# Patient Record
Sex: Female | Born: 1935 | Race: White | Hispanic: No | Marital: Married | Smoking: Never smoker
Health system: Southern US, Community
[De-identification: ages and names within clinical notes are randomized; demographics above are authoritative.]

## PROBLEM LIST (undated history)

## (undated) DIAGNOSIS — M1990 Unspecified osteoarthritis, unspecified site: Secondary | ICD-10-CM

## (undated) DIAGNOSIS — R9089 Other abnormal findings on diagnostic imaging of central nervous system: Secondary | ICD-10-CM

## (undated) DIAGNOSIS — N83209 Unspecified ovarian cyst, unspecified side: Secondary | ICD-10-CM

## (undated) DIAGNOSIS — Z9221 Personal history of antineoplastic chemotherapy: Secondary | ICD-10-CM

## (undated) DIAGNOSIS — M5136 Other intervertebral disc degeneration, lumbar region: Secondary | ICD-10-CM

## (undated) DIAGNOSIS — H9190 Unspecified hearing loss, unspecified ear: Secondary | ICD-10-CM

## (undated) DIAGNOSIS — D126 Benign neoplasm of colon, unspecified: Secondary | ICD-10-CM

## (undated) DIAGNOSIS — I10 Essential (primary) hypertension: Secondary | ICD-10-CM

## (undated) DIAGNOSIS — Z923 Personal history of irradiation: Secondary | ICD-10-CM

## (undated) DIAGNOSIS — N950 Postmenopausal bleeding: Secondary | ICD-10-CM

## (undated) DIAGNOSIS — K219 Gastro-esophageal reflux disease without esophagitis: Secondary | ICD-10-CM

## (undated) DIAGNOSIS — N3941 Urge incontinence: Secondary | ICD-10-CM

## (undated) DIAGNOSIS — B351 Tinea unguium: Secondary | ICD-10-CM

## (undated) DIAGNOSIS — E785 Hyperlipidemia, unspecified: Secondary | ICD-10-CM

## (undated) DIAGNOSIS — M51369 Other intervertebral disc degeneration, lumbar region without mention of lumbar back pain or lower extremity pain: Secondary | ICD-10-CM

## (undated) DIAGNOSIS — C50919 Malignant neoplasm of unspecified site of unspecified female breast: Secondary | ICD-10-CM

## (undated) DIAGNOSIS — M8580 Other specified disorders of bone density and structure, unspecified site: Secondary | ICD-10-CM

## (undated) HISTORY — DX: Benign neoplasm of colon, unspecified: D12.6

## (undated) HISTORY — PX: TONSILLECTOMY: SHX5217

## (undated) HISTORY — DX: Unspecified ovarian cyst, unspecified side: N83.209

## (undated) HISTORY — DX: Postmenopausal bleeding: N95.0

## (undated) HISTORY — DX: Other specified disorders of bone density and structure, unspecified site: M85.80

## (undated) HISTORY — DX: Malignant neoplasm of unspecified site of unspecified female breast: C50.919

## (undated) HISTORY — DX: Other intervertebral disc degeneration, lumbar region without mention of lumbar back pain or lower extremity pain: M51.369

## (undated) HISTORY — PX: CATARACT EXTRACTION: SUR2

## (undated) HISTORY — DX: Gastro-esophageal reflux disease without esophagitis: K21.9

## (undated) HISTORY — PX: OTHER SURGICAL HISTORY: SHX169

## (undated) HISTORY — DX: Urge incontinence: N39.41

## (undated) HISTORY — DX: Unspecified hearing loss, unspecified ear: H91.90

## (undated) HISTORY — PX: TUBAL LIGATION: SHX77

## (undated) HISTORY — DX: Other abnormal findings on diagnostic imaging of central nervous system: R90.89

## (undated) HISTORY — DX: Essential (primary) hypertension: I10

## (undated) HISTORY — DX: Tinea unguium: B35.1

## (undated) HISTORY — DX: Unspecified osteoarthritis, unspecified site: M19.90

## (undated) HISTORY — DX: Hyperlipidemia, unspecified: E78.5

## (undated) HISTORY — DX: Other intervertebral disc degeneration, lumbar region: M51.36

---

## 1998-04-05 ENCOUNTER — Encounter: Payer: Self-pay | Admitting: Family Medicine

## 1998-04-05 ENCOUNTER — Ambulatory Visit (HOSPITAL_COMMUNITY): Admission: RE | Admit: 1998-04-05 | Discharge: 1998-04-05 | Payer: Self-pay | Admitting: Family Medicine

## 1998-05-04 DIAGNOSIS — N950 Postmenopausal bleeding: Secondary | ICD-10-CM

## 1998-05-04 HISTORY — DX: Postmenopausal bleeding: N95.0

## 1999-01-14 ENCOUNTER — Ambulatory Visit (HOSPITAL_COMMUNITY): Admission: RE | Admit: 1999-01-14 | Discharge: 1999-01-14 | Payer: Self-pay | Admitting: Gastroenterology

## 1999-01-29 ENCOUNTER — Encounter (INDEPENDENT_AMBULATORY_CARE_PROVIDER_SITE_OTHER): Payer: Self-pay

## 1999-01-29 ENCOUNTER — Other Ambulatory Visit: Admission: RE | Admit: 1999-01-29 | Discharge: 1999-01-29 | Payer: Self-pay | Admitting: Obstetrics and Gynecology

## 1999-05-05 DIAGNOSIS — C50919 Malignant neoplasm of unspecified site of unspecified female breast: Secondary | ICD-10-CM

## 1999-05-05 HISTORY — PX: MASTECTOMY: SHX3

## 1999-05-05 HISTORY — DX: Malignant neoplasm of unspecified site of unspecified female breast: C50.919

## 1999-05-05 HISTORY — PX: OTHER SURGICAL HISTORY: SHX169

## 1999-07-16 ENCOUNTER — Encounter (INDEPENDENT_AMBULATORY_CARE_PROVIDER_SITE_OTHER): Payer: Self-pay

## 1999-07-16 ENCOUNTER — Encounter: Payer: Self-pay | Admitting: Family Medicine

## 1999-07-16 ENCOUNTER — Ambulatory Visit (HOSPITAL_COMMUNITY): Admission: RE | Admit: 1999-07-16 | Discharge: 1999-07-16 | Payer: Self-pay | Admitting: Family Medicine

## 1999-07-25 ENCOUNTER — Encounter (INDEPENDENT_AMBULATORY_CARE_PROVIDER_SITE_OTHER): Payer: Self-pay | Admitting: Specialist

## 1999-07-26 ENCOUNTER — Inpatient Hospital Stay (HOSPITAL_COMMUNITY): Admission: AD | Admit: 1999-07-26 | Discharge: 1999-07-30 | Payer: Self-pay | Admitting: General Surgery

## 1999-08-06 ENCOUNTER — Encounter: Payer: Self-pay | Admitting: Oncology

## 1999-08-06 ENCOUNTER — Ambulatory Visit (HOSPITAL_COMMUNITY): Admission: RE | Admit: 1999-08-06 | Discharge: 1999-08-06 | Payer: Self-pay | Admitting: Oncology

## 1999-08-07 ENCOUNTER — Encounter: Payer: Self-pay | Admitting: Oncology

## 1999-08-07 ENCOUNTER — Ambulatory Visit (HOSPITAL_COMMUNITY): Admission: RE | Admit: 1999-08-07 | Discharge: 1999-08-07 | Payer: Self-pay | Admitting: Oncology

## 1999-08-08 ENCOUNTER — Ambulatory Visit (HOSPITAL_COMMUNITY): Admission: RE | Admit: 1999-08-08 | Discharge: 1999-08-08 | Payer: Self-pay | Admitting: Oncology

## 1999-08-08 ENCOUNTER — Encounter: Payer: Self-pay | Admitting: Oncology

## 1999-08-13 ENCOUNTER — Encounter: Payer: Self-pay | Admitting: Oncology

## 1999-08-13 ENCOUNTER — Ambulatory Visit (HOSPITAL_COMMUNITY): Admission: RE | Admit: 1999-08-13 | Discharge: 1999-08-13 | Payer: Self-pay | Admitting: Oncology

## 1999-08-25 ENCOUNTER — Encounter: Payer: Self-pay | Admitting: General Surgery

## 1999-08-25 ENCOUNTER — Ambulatory Visit (HOSPITAL_BASED_OUTPATIENT_CLINIC_OR_DEPARTMENT_OTHER): Admission: RE | Admit: 1999-08-25 | Discharge: 1999-08-25 | Payer: Self-pay | Admitting: General Surgery

## 1999-10-23 ENCOUNTER — Encounter: Payer: Self-pay | Admitting: Oncology

## 1999-10-23 ENCOUNTER — Ambulatory Visit (HOSPITAL_COMMUNITY): Admission: RE | Admit: 1999-10-23 | Discharge: 1999-10-23 | Payer: Self-pay | Admitting: Oncology

## 1999-11-20 ENCOUNTER — Encounter: Payer: Self-pay | Admitting: Oncology

## 1999-11-20 ENCOUNTER — Ambulatory Visit (HOSPITAL_COMMUNITY): Admission: RE | Admit: 1999-11-20 | Discharge: 1999-11-20 | Payer: Self-pay | Admitting: Oncology

## 1999-12-03 DIAGNOSIS — R9089 Other abnormal findings on diagnostic imaging of central nervous system: Secondary | ICD-10-CM

## 1999-12-03 HISTORY — DX: Other abnormal findings on diagnostic imaging of central nervous system: R90.89

## 2000-02-27 ENCOUNTER — Ambulatory Visit (HOSPITAL_COMMUNITY): Admission: RE | Admit: 2000-02-27 | Discharge: 2000-02-27 | Payer: Self-pay | Admitting: Oncology

## 2000-02-27 ENCOUNTER — Encounter: Payer: Self-pay | Admitting: Oncology

## 2000-03-01 ENCOUNTER — Encounter: Admission: RE | Admit: 2000-03-01 | Discharge: 2000-05-30 | Payer: Self-pay | Admitting: Radiation Oncology

## 2000-03-08 ENCOUNTER — Encounter: Payer: Self-pay | Admitting: Neurology

## 2000-03-08 ENCOUNTER — Ambulatory Visit (HOSPITAL_COMMUNITY): Admission: RE | Admit: 2000-03-08 | Discharge: 2000-03-08 | Payer: Self-pay | Admitting: Neurology

## 2000-07-06 ENCOUNTER — Encounter: Admission: RE | Admit: 2000-07-06 | Discharge: 2000-07-27 | Payer: Self-pay | Admitting: Oncology

## 2000-07-21 ENCOUNTER — Encounter: Admission: EM | Admit: 2000-07-21 | Discharge: 2000-07-21 | Payer: Self-pay | Admitting: Family Medicine

## 2000-07-21 ENCOUNTER — Encounter: Payer: Self-pay | Admitting: Family Medicine

## 2000-10-08 ENCOUNTER — Ambulatory Visit (HOSPITAL_COMMUNITY): Admission: AD | Admit: 2000-10-08 | Discharge: 2000-10-08 | Payer: Self-pay | Admitting: Plastic Surgery

## 2000-10-08 ENCOUNTER — Encounter (INDEPENDENT_AMBULATORY_CARE_PROVIDER_SITE_OTHER): Payer: Self-pay | Admitting: *Deleted

## 2000-10-08 ENCOUNTER — Encounter: Payer: Self-pay | Admitting: Plastic Surgery

## 2000-10-13 ENCOUNTER — Encounter: Admission: RE | Admit: 2000-10-13 | Discharge: 2000-10-13 | Payer: Self-pay | Admitting: *Deleted

## 2000-10-13 ENCOUNTER — Encounter: Payer: Self-pay | Admitting: *Deleted

## 2000-10-20 ENCOUNTER — Ambulatory Visit (HOSPITAL_BASED_OUTPATIENT_CLINIC_OR_DEPARTMENT_OTHER): Admission: RE | Admit: 2000-10-20 | Discharge: 2000-10-20 | Payer: Self-pay | Admitting: *Deleted

## 2001-01-10 ENCOUNTER — Ambulatory Visit (HOSPITAL_BASED_OUTPATIENT_CLINIC_OR_DEPARTMENT_OTHER): Admission: RE | Admit: 2001-01-10 | Discharge: 2001-01-10 | Payer: Self-pay | Admitting: General Surgery

## 2001-05-04 DIAGNOSIS — D126 Benign neoplasm of colon, unspecified: Secondary | ICD-10-CM

## 2001-05-04 HISTORY — DX: Benign neoplasm of colon, unspecified: D12.6

## 2001-05-23 ENCOUNTER — Ambulatory Visit (HOSPITAL_COMMUNITY): Admission: RE | Admit: 2001-05-23 | Discharge: 2001-05-23 | Payer: Self-pay | Admitting: Gastroenterology

## 2001-05-23 ENCOUNTER — Encounter (INDEPENDENT_AMBULATORY_CARE_PROVIDER_SITE_OTHER): Payer: Self-pay

## 2001-05-23 HISTORY — PX: COLONOSCOPY W/ POLYPECTOMY: SHX1380

## 2001-07-04 ENCOUNTER — Encounter: Payer: Self-pay | Admitting: Oncology

## 2001-07-04 ENCOUNTER — Ambulatory Visit (HOSPITAL_COMMUNITY): Admission: RE | Admit: 2001-07-04 | Discharge: 2001-07-04 | Payer: Self-pay | Admitting: Oncology

## 2001-07-14 ENCOUNTER — Ambulatory Visit (HOSPITAL_COMMUNITY): Admission: RE | Admit: 2001-07-14 | Discharge: 2001-07-14 | Payer: Self-pay | Admitting: Oncology

## 2001-07-14 ENCOUNTER — Encounter: Payer: Self-pay | Admitting: Oncology

## 2001-07-22 ENCOUNTER — Ambulatory Visit (HOSPITAL_COMMUNITY): Admission: RE | Admit: 2001-07-22 | Discharge: 2001-07-22 | Payer: Self-pay | Admitting: Radiation Oncology

## 2002-02-22 ENCOUNTER — Encounter: Payer: Self-pay | Admitting: Oncology

## 2002-02-22 ENCOUNTER — Ambulatory Visit (HOSPITAL_COMMUNITY): Admission: RE | Admit: 2002-02-22 | Discharge: 2002-02-22 | Payer: Self-pay | Admitting: Oncology

## 2002-05-01 ENCOUNTER — Other Ambulatory Visit: Admission: RE | Admit: 2002-05-01 | Discharge: 2002-05-01 | Payer: Self-pay | Admitting: Obstetrics and Gynecology

## 2002-06-26 ENCOUNTER — Encounter: Payer: Self-pay | Admitting: Oncology

## 2002-06-26 ENCOUNTER — Ambulatory Visit (HOSPITAL_COMMUNITY): Admission: RE | Admit: 2002-06-26 | Discharge: 2002-06-26 | Payer: Self-pay | Admitting: Oncology

## 2002-07-25 ENCOUNTER — Ambulatory Visit (HOSPITAL_COMMUNITY): Admission: RE | Admit: 2002-07-25 | Discharge: 2002-07-25 | Payer: Self-pay | Admitting: Oncology

## 2002-07-25 ENCOUNTER — Encounter: Payer: Self-pay | Admitting: Oncology

## 2002-08-15 ENCOUNTER — Encounter: Admission: RE | Admit: 2002-08-15 | Discharge: 2002-09-07 | Payer: Self-pay | Admitting: Oncology

## 2002-08-24 ENCOUNTER — Encounter: Payer: Self-pay | Admitting: Oncology

## 2002-08-24 ENCOUNTER — Ambulatory Visit (HOSPITAL_COMMUNITY): Admission: RE | Admit: 2002-08-24 | Discharge: 2002-08-24 | Payer: Self-pay | Admitting: Oncology

## 2003-01-15 ENCOUNTER — Encounter: Payer: Self-pay | Admitting: Oncology

## 2003-01-15 ENCOUNTER — Ambulatory Visit: Admission: RE | Admit: 2003-01-15 | Discharge: 2003-01-15 | Payer: Self-pay | Admitting: Oncology

## 2003-05-05 HISTORY — PX: CHOLECYSTECTOMY, LAPAROSCOPIC: SHX56

## 2003-05-10 ENCOUNTER — Observation Stay (HOSPITAL_COMMUNITY): Admission: RE | Admit: 2003-05-10 | Discharge: 2003-05-10 | Payer: Self-pay | Admitting: Surgery

## 2003-05-10 ENCOUNTER — Encounter (INDEPENDENT_AMBULATORY_CARE_PROVIDER_SITE_OTHER): Payer: Self-pay | Admitting: Specialist

## 2003-05-23 ENCOUNTER — Ambulatory Visit (HOSPITAL_COMMUNITY): Admission: RE | Admit: 2003-05-23 | Discharge: 2003-05-23 | Payer: Self-pay | Admitting: Oncology

## 2003-06-01 ENCOUNTER — Other Ambulatory Visit: Admission: RE | Admit: 2003-06-01 | Discharge: 2003-06-01 | Payer: Self-pay | Admitting: Family Medicine

## 2003-06-07 ENCOUNTER — Encounter: Admission: RE | Admit: 2003-06-07 | Discharge: 2003-06-07 | Payer: Self-pay | Admitting: Oncology

## 2003-10-10 ENCOUNTER — Encounter: Admission: RE | Admit: 2003-10-10 | Discharge: 2003-10-10 | Payer: Self-pay | Admitting: Oncology

## 2003-10-15 ENCOUNTER — Encounter: Admission: RE | Admit: 2003-10-15 | Discharge: 2003-10-15 | Payer: Self-pay | Admitting: Oncology

## 2004-04-21 ENCOUNTER — Ambulatory Visit (HOSPITAL_COMMUNITY): Admission: RE | Admit: 2004-04-21 | Discharge: 2004-04-21 | Payer: Self-pay | Admitting: Oncology

## 2004-05-19 ENCOUNTER — Ambulatory Visit: Payer: Self-pay | Admitting: Oncology

## 2004-10-28 ENCOUNTER — Encounter: Admission: RE | Admit: 2004-10-28 | Discharge: 2004-10-28 | Payer: Self-pay | Admitting: Oncology

## 2004-12-15 ENCOUNTER — Ambulatory Visit: Payer: Self-pay | Admitting: Oncology

## 2004-12-28 ENCOUNTER — Observation Stay (HOSPITAL_COMMUNITY): Admission: EM | Admit: 2004-12-28 | Discharge: 2004-12-29 | Payer: Self-pay | Admitting: Emergency Medicine

## 2005-01-14 ENCOUNTER — Encounter: Admission: RE | Admit: 2005-01-14 | Discharge: 2005-01-14 | Payer: Self-pay | Admitting: Family Medicine

## 2005-02-18 ENCOUNTER — Encounter: Admission: RE | Admit: 2005-02-18 | Discharge: 2005-02-18 | Payer: Self-pay | Admitting: Family Medicine

## 2005-06-09 ENCOUNTER — Encounter: Admission: RE | Admit: 2005-06-09 | Discharge: 2005-06-09 | Payer: Self-pay | Admitting: Oncology

## 2005-06-10 ENCOUNTER — Other Ambulatory Visit: Admission: RE | Admit: 2005-06-10 | Discharge: 2005-06-10 | Payer: Self-pay | Admitting: Family Medicine

## 2005-06-11 ENCOUNTER — Ambulatory Visit (HOSPITAL_COMMUNITY): Admission: RE | Admit: 2005-06-11 | Discharge: 2005-06-11 | Payer: Self-pay | Admitting: Oncology

## 2005-06-12 ENCOUNTER — Ambulatory Visit: Payer: Self-pay | Admitting: Oncology

## 2005-11-19 ENCOUNTER — Encounter: Admission: RE | Admit: 2005-11-19 | Discharge: 2005-11-19 | Payer: Self-pay | Admitting: Oncology

## 2005-12-18 ENCOUNTER — Ambulatory Visit (HOSPITAL_COMMUNITY): Admission: RE | Admit: 2005-12-18 | Discharge: 2005-12-18 | Payer: Self-pay | Admitting: Oncology

## 2006-01-19 ENCOUNTER — Ambulatory Visit: Payer: Self-pay | Admitting: Oncology

## 2006-01-21 LAB — CBC WITH DIFFERENTIAL/PLATELET
BASO%: 0.3 % (ref 0.0–2.0)
Basophils Absolute: 0 10*3/uL (ref 0.0–0.1)
EOS%: 0.6 % (ref 0.0–7.0)
Eosinophils Absolute: 0 10*3/uL (ref 0.0–0.5)
HCT: 37.6 % (ref 34.8–46.6)
HGB: 12.9 g/dL (ref 11.6–15.9)
LYMPH%: 24.6 % (ref 14.0–48.0)
MCH: 30.4 pg (ref 26.0–34.0)
MCHC: 34.2 g/dL (ref 32.0–36.0)
MCV: 88.9 fL (ref 81.0–101.0)
MONO#: 0.5 10*3/uL (ref 0.1–0.9)
MONO%: 8.4 % (ref 0.0–13.0)
NEUT#: 4 10*3/uL (ref 1.5–6.5)
NEUT%: 66.1 % (ref 39.6–76.8)
Platelets: 166 10*3/uL (ref 145–400)
RBC: 4.23 10*6/uL (ref 3.70–5.32)
RDW: 14.3 % (ref 11.3–14.5)
WBC: 6.1 10*3/uL (ref 3.9–10.0)
lymph#: 1.5 10*3/uL (ref 0.9–3.3)

## 2006-01-21 LAB — COMPREHENSIVE METABOLIC PANEL
ALT: 18 U/L (ref 0–40)
AST: 19 U/L (ref 0–37)
Albumin: 4.1 g/dL (ref 3.5–5.2)
Alkaline Phosphatase: 72 U/L (ref 39–117)
BUN: 37 mg/dL — ABNORMAL HIGH (ref 6–23)
CO2: 31 mEq/L (ref 19–32)
Calcium: 9.7 mg/dL (ref 8.4–10.5)
Chloride: 103 mEq/L (ref 96–112)
Creatinine, Ser: 1.24 mg/dL — ABNORMAL HIGH (ref 0.40–1.20)
Glucose, Bld: 90 mg/dL (ref 70–99)
Potassium: 4.1 mEq/L (ref 3.5–5.3)
Sodium: 143 mEq/L (ref 135–145)
Total Bilirubin: 0.5 mg/dL (ref 0.3–1.2)
Total Protein: 6.3 g/dL (ref 6.0–8.3)

## 2006-01-21 LAB — LACTATE DEHYDROGENASE: LDH: 179 U/L (ref 94–250)

## 2006-01-21 LAB — CANCER ANTIGEN 27.29: CA 27.29: 12 U/mL (ref 0–39)

## 2006-07-27 ENCOUNTER — Ambulatory Visit: Payer: Self-pay | Admitting: Oncology

## 2006-07-29 LAB — CBC WITH DIFFERENTIAL/PLATELET
BASO%: 0.3 % (ref 0.0–2.0)
Basophils Absolute: 0 10*3/uL (ref 0.0–0.1)
EOS%: 0.7 % (ref 0.0–7.0)
Eosinophils Absolute: 0 10*3/uL (ref 0.0–0.5)
HCT: 38.1 % (ref 34.8–46.6)
HGB: 13.5 g/dL (ref 11.6–15.9)
LYMPH%: 20.2 % (ref 14.0–48.0)
MCH: 31 pg (ref 26.0–34.0)
MCHC: 35.4 g/dL (ref 32.0–36.0)
MCV: 87.8 fL (ref 81.0–101.0)
MONO#: 0.5 10*3/uL (ref 0.1–0.9)
MONO%: 6.6 % (ref 0.0–13.0)
NEUT#: 5 10*3/uL (ref 1.5–6.5)
NEUT%: 72.2 % (ref 39.6–76.8)
Platelets: 142 10*3/uL — ABNORMAL LOW (ref 145–400)
RBC: 4.34 10*6/uL (ref 3.70–5.32)
RDW: 13.6 % (ref 11.3–14.5)
WBC: 6.9 10*3/uL (ref 3.9–10.0)
lymph#: 1.4 10*3/uL (ref 0.9–3.3)

## 2006-07-29 LAB — COMPREHENSIVE METABOLIC PANEL
ALT: 16 U/L (ref 0–35)
AST: 19 U/L (ref 0–37)
Albumin: 4.1 g/dL (ref 3.5–5.2)
Alkaline Phosphatase: 73 U/L (ref 39–117)
BUN: 31 mg/dL — ABNORMAL HIGH (ref 6–23)
CO2: 30 mEq/L (ref 19–32)
Calcium: 10.1 mg/dL (ref 8.4–10.5)
Chloride: 101 mEq/L (ref 96–112)
Creatinine, Ser: 1.21 mg/dL — ABNORMAL HIGH (ref 0.40–1.20)
Glucose, Bld: 100 mg/dL — ABNORMAL HIGH (ref 70–99)
Potassium: 3.8 mEq/L (ref 3.5–5.3)
Sodium: 140 mEq/L (ref 135–145)
Total Bilirubin: 0.5 mg/dL (ref 0.3–1.2)
Total Protein: 6.5 g/dL (ref 6.0–8.3)

## 2006-07-29 LAB — CANCER ANTIGEN 27.29: CA 27.29: 21 U/mL (ref 0–39)

## 2006-07-29 LAB — LACTATE DEHYDROGENASE: LDH: 167 U/L (ref 94–250)

## 2006-11-29 ENCOUNTER — Encounter: Admission: RE | Admit: 2006-11-29 | Discharge: 2006-11-29 | Payer: Self-pay | Admitting: Oncology

## 2007-01-31 ENCOUNTER — Ambulatory Visit: Payer: Self-pay | Admitting: Oncology

## 2007-02-02 ENCOUNTER — Ambulatory Visit (HOSPITAL_COMMUNITY): Admission: RE | Admit: 2007-02-02 | Discharge: 2007-02-02 | Payer: Self-pay | Admitting: Oncology

## 2007-02-02 LAB — CBC WITH DIFFERENTIAL/PLATELET
BASO%: 0.3 % (ref 0.0–2.0)
Basophils Absolute: 0 10*3/uL (ref 0.0–0.1)
EOS%: 0.9 % (ref 0.0–7.0)
Eosinophils Absolute: 0.1 10*3/uL (ref 0.0–0.5)
HCT: 37.5 % (ref 34.8–46.6)
HGB: 12.9 g/dL (ref 11.6–15.9)
LYMPH%: 25.2 % (ref 14.0–48.0)
MCH: 30.6 pg (ref 26.0–34.0)
MCHC: 34.5 g/dL (ref 32.0–36.0)
MCV: 88.7 fL (ref 81.0–101.0)
MONO#: 0.5 10*3/uL (ref 0.1–0.9)
MONO%: 7.7 % (ref 0.0–13.0)
NEUT#: 4.1 10*3/uL (ref 1.5–6.5)
NEUT%: 65.9 % (ref 39.6–76.8)
Platelets: 175 10*3/uL (ref 145–400)
RBC: 4.23 10*6/uL (ref 3.70–5.32)
RDW: 13.5 % (ref 11.3–14.5)
WBC: 6.2 10*3/uL (ref 3.9–10.0)
lymph#: 1.6 10*3/uL (ref 0.9–3.3)

## 2007-02-08 LAB — COMPREHENSIVE METABOLIC PANEL
ALT: 25 U/L (ref 0–35)
AST: 25 U/L (ref 0–37)
Albumin: 4.1 g/dL (ref 3.5–5.2)
Alkaline Phosphatase: 77 U/L (ref 39–117)
BUN: 34 mg/dL — ABNORMAL HIGH (ref 6–23)
CO2: 28 mEq/L (ref 19–32)
Calcium: 9.8 mg/dL (ref 8.4–10.5)
Chloride: 100 mEq/L (ref 96–112)
Creatinine, Ser: 1.31 mg/dL — ABNORMAL HIGH (ref 0.40–1.20)
Glucose, Bld: 85 mg/dL (ref 70–99)
Potassium: 3.7 mEq/L (ref 3.5–5.3)
Sodium: 137 mEq/L (ref 135–145)
Total Bilirubin: 0.5 mg/dL (ref 0.3–1.2)
Total Protein: 6.4 g/dL (ref 6.0–8.3)

## 2007-02-08 LAB — LACTATE DEHYDROGENASE: LDH: 191 U/L (ref 94–250)

## 2007-02-08 LAB — CANCER ANTIGEN 27.29: CA 27.29: 13 U/mL (ref 0–39)

## 2007-02-08 LAB — VITAMIN D PNL(25-HYDRXY+1,25-DIHY)-BLD
Vit D, 1,25-Dihydroxy: 39 pg/mL (ref 6–62)
Vit D, 25-Hydroxy: 36 ng/mL (ref 20–57)

## 2007-02-22 ENCOUNTER — Ambulatory Visit (HOSPITAL_COMMUNITY): Admission: RE | Admit: 2007-02-22 | Discharge: 2007-02-22 | Payer: Self-pay | Admitting: Oncology

## 2007-06-13 ENCOUNTER — Encounter: Admission: RE | Admit: 2007-06-13 | Discharge: 2007-06-13 | Payer: Self-pay | Admitting: Oncology

## 2007-08-09 ENCOUNTER — Ambulatory Visit: Payer: Self-pay | Admitting: Oncology

## 2007-08-10 LAB — CBC WITH DIFFERENTIAL/PLATELET
BASO%: 0.4 % (ref 0.0–2.0)
Basophils Absolute: 0 10*3/uL (ref 0.0–0.1)
EOS%: 1.4 % (ref 0.0–7.0)
Eosinophils Absolute: 0.1 10*3/uL (ref 0.0–0.5)
HCT: 39.4 % (ref 34.8–46.6)
HGB: 14 g/dL (ref 11.6–15.9)
LYMPH%: 21.6 % (ref 14.0–48.0)
MCH: 31 pg (ref 26.0–34.0)
MCHC: 35.6 g/dL (ref 32.0–36.0)
MCV: 87.1 fL (ref 81.0–101.0)
MONO#: 0.4 10*3/uL (ref 0.1–0.9)
MONO%: 5.6 % (ref 0.0–13.0)
NEUT#: 4.5 10*3/uL (ref 1.5–6.5)
NEUT%: 71 % (ref 39.6–76.8)
Platelets: 171 10*3/uL (ref 145–400)
RBC: 4.52 10*6/uL (ref 3.70–5.32)
RDW: 13.9 % (ref 11.3–14.5)
WBC: 6.4 10*3/uL (ref 3.9–10.0)
lymph#: 1.4 10*3/uL (ref 0.9–3.3)

## 2007-08-11 LAB — COMPREHENSIVE METABOLIC PANEL
ALT: 21 U/L (ref 0–35)
AST: 21 U/L (ref 0–37)
Albumin: 4.4 g/dL (ref 3.5–5.2)
Alkaline Phosphatase: 85 U/L (ref 39–117)
BUN: 35 mg/dL — ABNORMAL HIGH (ref 6–23)
CO2: 26 mEq/L (ref 19–32)
Calcium: 10.5 mg/dL (ref 8.4–10.5)
Chloride: 102 mEq/L (ref 96–112)
Creatinine, Ser: 1.18 mg/dL (ref 0.40–1.20)
Glucose, Bld: 97 mg/dL (ref 70–99)
Potassium: 4.2 mEq/L (ref 3.5–5.3)
Sodium: 142 mEq/L (ref 135–145)
Total Bilirubin: 0.6 mg/dL (ref 0.3–1.2)
Total Protein: 7 g/dL (ref 6.0–8.3)

## 2007-08-11 LAB — CANCER ANTIGEN 27.29: CA 27.29: 18 U/mL (ref 0–39)

## 2007-08-11 LAB — LACTATE DEHYDROGENASE: LDH: 189 U/L (ref 94–250)

## 2007-08-11 LAB — VITAMIN D 25 HYDROXY (VIT D DEFICIENCY, FRACTURES): Vit D, 25-Hydroxy: 46 ng/mL (ref 30–89)

## 2007-08-17 LAB — VITAMIN D 1,25 DIHYDROXY: Vit D, 1,25-Dihydroxy: 42 pg/mL (ref 15–75)

## 2007-10-27 ENCOUNTER — Other Ambulatory Visit: Admission: RE | Admit: 2007-10-27 | Discharge: 2007-10-27 | Payer: Self-pay | Admitting: Family Medicine

## 2007-10-27 LAB — HM PAP SMEAR: HM Pap smear: NORMAL

## 2007-12-01 ENCOUNTER — Encounter: Admission: RE | Admit: 2007-12-01 | Discharge: 2007-12-01 | Payer: Self-pay | Admitting: Oncology

## 2008-02-08 ENCOUNTER — Ambulatory Visit: Payer: Self-pay | Admitting: Oncology

## 2008-02-10 LAB — CBC WITH DIFFERENTIAL/PLATELET
BASO%: 0.3 % (ref 0.0–2.0)
Basophils Absolute: 0 10*3/uL (ref 0.0–0.1)
EOS%: 1.3 % (ref 0.0–7.0)
Eosinophils Absolute: 0.1 10*3/uL (ref 0.0–0.5)
HCT: 37.9 % (ref 34.8–46.6)
HGB: 12.9 g/dL (ref 11.6–15.9)
LYMPH%: 24.9 % (ref 14.0–48.0)
MCH: 30.6 pg (ref 26.0–34.0)
MCHC: 34.1 g/dL (ref 32.0–36.0)
MCV: 89.8 fL (ref 81.0–101.0)
MONO#: 0.4 10*3/uL (ref 0.1–0.9)
MONO%: 7.1 % (ref 0.0–13.0)
NEUT#: 4.2 10*3/uL (ref 1.5–6.5)
NEUT%: 66.4 % (ref 39.6–76.8)
Platelets: 153 10*3/uL (ref 145–400)
RBC: 4.23 10*6/uL (ref 3.70–5.32)
RDW: 14.2 % (ref 11.3–14.5)
WBC: 6.3 10*3/uL (ref 3.9–10.0)
lymph#: 1.6 10*3/uL (ref 0.9–3.3)

## 2008-02-13 LAB — COMPREHENSIVE METABOLIC PANEL
ALT: 16 U/L (ref 0–35)
AST: 24 U/L (ref 0–37)
Albumin: 4.1 g/dL (ref 3.5–5.2)
Alkaline Phosphatase: 68 U/L (ref 39–117)
BUN: 33 mg/dL — ABNORMAL HIGH (ref 6–23)
CO2: 27 mEq/L (ref 19–32)
Calcium: 9.9 mg/dL (ref 8.4–10.5)
Chloride: 104 mEq/L (ref 96–112)
Creatinine, Ser: 1.28 mg/dL — ABNORMAL HIGH (ref 0.40–1.20)
Glucose, Bld: 81 mg/dL (ref 70–99)
Potassium: 4 mEq/L (ref 3.5–5.3)
Sodium: 141 mEq/L (ref 135–145)
Total Bilirubin: 0.4 mg/dL (ref 0.3–1.2)
Total Protein: 6.1 g/dL (ref 6.0–8.3)

## 2008-02-13 LAB — VITAMIN D 25 HYDROXY (VIT D DEFICIENCY, FRACTURES): Vit D, 25-Hydroxy: 49 ng/mL (ref 30–89)

## 2008-02-13 LAB — LACTATE DEHYDROGENASE: LDH: 173 U/L (ref 94–250)

## 2008-02-13 LAB — CANCER ANTIGEN 27.29: CA 27.29: 15 U/mL (ref 0–39)

## 2008-07-30 ENCOUNTER — Ambulatory Visit: Payer: Self-pay | Admitting: Oncology

## 2008-08-02 LAB — CBC WITH DIFFERENTIAL/PLATELET
BASO%: 0.4 % (ref 0.0–2.0)
Basophils Absolute: 0 10*3/uL (ref 0.0–0.1)
EOS%: 1.2 % (ref 0.0–7.0)
Eosinophils Absolute: 0.1 10*3/uL (ref 0.0–0.5)
HCT: 40 % (ref 34.8–46.6)
HGB: 13.7 g/dL (ref 11.6–15.9)
LYMPH%: 23.6 % (ref 14.0–49.7)
MCH: 30.4 pg (ref 25.1–34.0)
MCHC: 34.2 g/dL (ref 31.5–36.0)
MCV: 88.8 fL (ref 79.5–101.0)
MONO#: 0.4 10*3/uL (ref 0.1–0.9)
MONO%: 6.2 % (ref 0.0–14.0)
NEUT#: 4.5 10*3/uL (ref 1.5–6.5)
NEUT%: 68.6 % (ref 38.4–76.8)
Platelets: 152 10*3/uL (ref 145–400)
RBC: 4.5 10*6/uL (ref 3.70–5.45)
RDW: 13.7 % (ref 11.2–14.5)
WBC: 6.6 10*3/uL (ref 3.9–10.3)
lymph#: 1.6 10*3/uL (ref 0.9–3.3)

## 2008-08-03 LAB — COMPREHENSIVE METABOLIC PANEL
ALT: 17 U/L (ref 0–35)
AST: 19 U/L (ref 0–37)
Albumin: 4.3 g/dL (ref 3.5–5.2)
Alkaline Phosphatase: 78 U/L (ref 39–117)
BUN: 36 mg/dL — ABNORMAL HIGH (ref 6–23)
CO2: 27 mEq/L (ref 19–32)
Calcium: 9.9 mg/dL (ref 8.4–10.5)
Chloride: 103 mEq/L (ref 96–112)
Creatinine, Ser: 1.31 mg/dL — ABNORMAL HIGH (ref 0.40–1.20)
Glucose, Bld: 97 mg/dL (ref 70–99)
Potassium: 4.3 mEq/L (ref 3.5–5.3)
Sodium: 140 mEq/L (ref 135–145)
Total Bilirubin: 0.5 mg/dL (ref 0.3–1.2)
Total Protein: 6.6 g/dL (ref 6.0–8.3)

## 2008-08-03 LAB — CANCER ANTIGEN 27.29: CA 27.29: 12 U/mL (ref 0–39)

## 2008-08-03 LAB — VITAMIN D 25 HYDROXY (VIT D DEFICIENCY, FRACTURES): Vit D, 25-Hydroxy: 52 ng/mL (ref 30–89)

## 2008-08-03 LAB — LACTATE DEHYDROGENASE: LDH: 162 U/L (ref 94–250)

## 2008-12-02 DIAGNOSIS — N83209 Unspecified ovarian cyst, unspecified side: Secondary | ICD-10-CM

## 2008-12-02 HISTORY — DX: Unspecified ovarian cyst, unspecified side: N83.209

## 2008-12-03 ENCOUNTER — Encounter: Admission: RE | Admit: 2008-12-03 | Discharge: 2008-12-03 | Payer: Self-pay | Admitting: Oncology

## 2008-12-17 HISTORY — PX: COLONOSCOPY: SHX174

## 2008-12-17 LAB — HM COLONOSCOPY

## 2009-06-13 ENCOUNTER — Encounter: Admission: RE | Admit: 2009-06-13 | Discharge: 2009-06-13 | Payer: Self-pay | Admitting: Oncology

## 2009-09-06 ENCOUNTER — Ambulatory Visit: Payer: Self-pay | Admitting: Oncology

## 2009-09-09 LAB — CBC WITH DIFFERENTIAL/PLATELET
BASO%: 0.3 % (ref 0.0–2.0)
Basophils Absolute: 0 10*3/uL (ref 0.0–0.1)
EOS%: 1.6 % (ref 0.0–7.0)
Eosinophils Absolute: 0.1 10*3/uL (ref 0.0–0.5)
HCT: 39.9 % (ref 34.8–46.6)
HGB: 13.5 g/dL (ref 11.6–15.9)
LYMPH%: 19.8 % (ref 14.0–49.7)
MCH: 30.7 pg (ref 25.1–34.0)
MCHC: 33.8 g/dL (ref 31.5–36.0)
MCV: 90.7 fL (ref 79.5–101.0)
MONO#: 0.5 10*3/uL (ref 0.1–0.9)
MONO%: 6.2 % (ref 0.0–14.0)
NEUT#: 5.5 10*3/uL (ref 1.5–6.5)
NEUT%: 72.1 % (ref 38.4–76.8)
Platelets: 154 10*3/uL (ref 145–400)
RBC: 4.4 10*6/uL (ref 3.70–5.45)
RDW: 14.1 % (ref 11.2–14.5)
WBC: 7.6 10*3/uL (ref 3.9–10.3)
lymph#: 1.5 10*3/uL (ref 0.9–3.3)

## 2009-09-09 LAB — COMPREHENSIVE METABOLIC PANEL
ALT: 26 U/L (ref 0–35)
AST: 26 U/L (ref 0–37)
Albumin: 4 g/dL (ref 3.5–5.2)
Alkaline Phosphatase: 76 U/L (ref 39–117)
BUN: 29 mg/dL — ABNORMAL HIGH (ref 6–23)
CO2: 28 mEq/L (ref 19–32)
Calcium: 9.3 mg/dL (ref 8.4–10.5)
Chloride: 103 mEq/L (ref 96–112)
Creatinine, Ser: 1.27 mg/dL — ABNORMAL HIGH (ref 0.40–1.20)
Glucose, Bld: 119 mg/dL — ABNORMAL HIGH (ref 70–99)
Potassium: 3.6 mEq/L (ref 3.5–5.3)
Sodium: 140 mEq/L (ref 135–145)
Total Bilirubin: 0.5 mg/dL (ref 0.3–1.2)
Total Protein: 6.3 g/dL (ref 6.0–8.3)

## 2009-09-09 LAB — CANCER ANTIGEN 27.29: CA 27.29: 17 U/mL (ref 0–39)

## 2009-09-09 LAB — LACTATE DEHYDROGENASE: LDH: 184 U/L (ref 94–250)

## 2009-09-09 LAB — VITAMIN D 25 HYDROXY (VIT D DEFICIENCY, FRACTURES): Vit D, 25-Hydroxy: 59 ng/mL (ref 30–89)

## 2009-12-10 ENCOUNTER — Encounter: Admission: RE | Admit: 2009-12-10 | Discharge: 2009-12-10 | Payer: Self-pay | Admitting: Oncology

## 2010-05-04 DIAGNOSIS — H9190 Unspecified hearing loss, unspecified ear: Secondary | ICD-10-CM

## 2010-05-04 HISTORY — DX: Unspecified hearing loss, unspecified ear: H91.90

## 2010-05-25 ENCOUNTER — Encounter: Payer: Self-pay | Admitting: Oncology

## 2010-09-11 ENCOUNTER — Other Ambulatory Visit: Payer: Self-pay | Admitting: Oncology

## 2010-09-11 ENCOUNTER — Encounter (HOSPITAL_BASED_OUTPATIENT_CLINIC_OR_DEPARTMENT_OTHER): Payer: Medicare Other | Admitting: Oncology

## 2010-09-11 DIAGNOSIS — Z170 Estrogen receptor positive status [ER+]: Secondary | ICD-10-CM

## 2010-09-11 DIAGNOSIS — C50919 Malignant neoplasm of unspecified site of unspecified female breast: Secondary | ICD-10-CM

## 2010-09-11 LAB — CBC WITH DIFFERENTIAL/PLATELET
BASO%: 0.9 % (ref 0.0–2.0)
Basophils Absolute: 0.1 10*3/uL (ref 0.0–0.1)
EOS%: 2.3 % (ref 0.0–7.0)
Eosinophils Absolute: 0.1 10*3/uL (ref 0.0–0.5)
HCT: 39.8 % (ref 34.8–46.6)
HGB: 13.3 g/dL (ref 11.6–15.9)
LYMPH%: 23.7 % (ref 14.0–49.7)
MCH: 29.9 pg (ref 25.1–34.0)
MCHC: 33.3 g/dL (ref 31.5–36.0)
MCV: 89.7 fL (ref 79.5–101.0)
MONO#: 0.5 10*3/uL (ref 0.1–0.9)
MONO%: 7.4 % (ref 0.0–14.0)
NEUT#: 4.3 10*3/uL (ref 1.5–6.5)
NEUT%: 65.7 % (ref 38.4–76.8)
Platelets: 153 10*3/uL (ref 145–400)
RBC: 4.43 10*6/uL (ref 3.70–5.45)
RDW: 13.9 % (ref 11.2–14.5)
WBC: 6.6 10*3/uL (ref 3.9–10.3)
lymph#: 1.6 10*3/uL (ref 0.9–3.3)

## 2010-09-12 LAB — COMPREHENSIVE METABOLIC PANEL
ALT: 17 U/L (ref 0–35)
AST: 21 U/L (ref 0–37)
Albumin: 4.2 g/dL (ref 3.5–5.2)
Alkaline Phosphatase: 77 U/L (ref 39–117)
BUN: 35 mg/dL — ABNORMAL HIGH (ref 6–23)
CO2: 26 mEq/L (ref 19–32)
Calcium: 10.6 mg/dL — ABNORMAL HIGH (ref 8.4–10.5)
Chloride: 104 mEq/L (ref 96–112)
Creatinine, Ser: 1.18 mg/dL (ref 0.40–1.20)
Glucose, Bld: 67 mg/dL — ABNORMAL LOW (ref 70–99)
Potassium: 4.2 mEq/L (ref 3.5–5.3)
Sodium: 142 mEq/L (ref 135–145)
Total Bilirubin: 0.4 mg/dL (ref 0.3–1.2)
Total Protein: 6.4 g/dL (ref 6.0–8.3)

## 2010-09-12 LAB — LACTATE DEHYDROGENASE: LDH: 174 U/L (ref 94–250)

## 2010-09-12 LAB — VITAMIN D 25 HYDROXY (VIT D DEFICIENCY, FRACTURES): Vit D, 25-Hydroxy: 68 ng/mL (ref 30–89)

## 2010-09-12 LAB — CANCER ANTIGEN 27.29: CA 27.29: 19 U/mL (ref 0–39)

## 2010-09-18 ENCOUNTER — Other Ambulatory Visit: Payer: Self-pay | Admitting: Oncology

## 2010-09-18 ENCOUNTER — Encounter (HOSPITAL_BASED_OUTPATIENT_CLINIC_OR_DEPARTMENT_OTHER): Payer: Medicare Other | Admitting: Oncology

## 2010-09-18 DIAGNOSIS — Z9011 Acquired absence of right breast and nipple: Secondary | ICD-10-CM

## 2010-09-18 DIAGNOSIS — C50919 Malignant neoplasm of unspecified site of unspecified female breast: Secondary | ICD-10-CM

## 2010-09-18 DIAGNOSIS — Z170 Estrogen receptor positive status [ER+]: Secondary | ICD-10-CM

## 2010-09-19 NOTE — H&P (Signed)
Connie [name], Connie [name] NO.:  [id number]   MEDICAL RECORD NO.:  [id number]          PATIENT TYPE:  EMS   LOCATION:  MAJO                         FACILITY:  [facility]   PHYSICIAN:  Jackie Plum, M.D.DATE OF BIRTH:  1927-09-05   DATE OF ADMISSION:  12/28/2004  DATE OF DISCHARGE:                                HISTORY & PHYSICAL   CHIEF COMPLAINT:  Syncope.   HISTORY OF PRESENT ILLNESS:  The patient is a very pleasant 77 year old  Caucasian lady who was brought to the hospital by her husband, Gerlene Burdock, on  account of above complaint.  According to the husband and the patient, she  had not been feeling well over the last week or so, and she had been treated  by her primary care physician with Z-Pak for chest congestion and  bronchitis.  In addition, she recently changed her blood pressure  medication.  She  recently had a refill of her blood pressure medicines, and  she noted that the color of the medication was different.  She is not sure  if there was difference in the dosages, though.  Also, her blood pressure  had run low a few times during the week.  The patient is querist at church,  and today at church during a living church, she passed out.  She was grabbed  by the husband and slumped down.  She was out for a few minutes.  There was  no witnessed tonic-clonic activity.  She was not having any bladder or bowel  incontinence.  EMT was activated, and apparently the patient was noted to be  hypotensive, but I do not have the EMT documentation to this effect, and  this was given to me by the patient's husband during history-taking process.  Evidently the patient was given a bolus of normal saline.  A head CT was  done, and the preliminary report indicates no acute stroke.  She had a 12-  lead EKG which showed sinus rhythm without any acute ST or T wave changes.  Hospitalists services was, therefore, asked for admission.   The patient denies any chest pain,  shortness of breath, dizziness.  She does  not recall any significant antecedent symptomatology prior to her syncopal  episode.  At the moment her only complaint is generalized weakness.  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   PAST MEDICAL HISTORY:  1.  History of hypertension.  2.  Breast cancer.   ALLERGIES:  No known drug allergies.   MEDICATIONS:  1.  Benazepril/hydrochlorothiazide 20/12.5 mg 1 daily.  2.  Centrum Silver 1 per day.  3.  Detrol LA 4 mg daily.  4.  Zithromax 50 mg daily for 3 more doses.  5.  Arimidex 1 mg daily.  6.  Fosamax 17 mg weekly.  7.  Catrix 600 mg 1 per day.   FAMILY HISTORY:  Positive for heart disease.   SOCIAL HISTORY:  The patient does not smoke cigarettes, does not  drink  alcohol.  She lives with her husband, and they have three grown-up children,  two of whom live outside of [place].  They have eight grandchildren.  They are well traveled and have traveled throughout [place] and almost all  the countries of the world.  They last traveled a couple of months ago when  they were in [place].   REVIEW OF SYSTEMS:  Significant positives and negatives unremarkable except  as noted in HPI above.   PHYSICAL EXAMINATION:  VITAL SIGNS:  Blood pressure 142/55, pulse 83,  temperature 97, respiratory rate 20, O2 saturation 99%.  GENERAL:  She is not ill appearing.  She is not in distress, appears  comfortable.  HEENT:  Normocephalic and atraumatic.  Pupils equal, round, and reactive to  light.  Extraocular movements intact.  Oropharynx moist.  NECK:  Supple.  No JVD.  LUNGS: Clear to auscultation.  CARDIAC:  Regular rate and rhythm, no gallops or murmur.  ABDOMEN:  Soft, nontender.  Bowel sounds present.  They were normoactive.  EXTREMITIES:  No cyanosis, no edema.  CNS:  The patient is alert and oriented  x3.  No acute focal deficits.   LABORATORY DATA:  CT scan report, 12-lead EKG as stated above.   WBC 15, hemoglobin 14.3, hematocrit 40.3, MCV 88.5, platelet count 181.  Point-of-care cardiac markers were negative.  UA was negative for urinary  tract infection.  Metabolic panel: Sodium 136, potassium 4, chloride 100,  CO2 27, glucose 109, BUN 27, creatinine 1.4, calcium 9.  Total protein 6.7,  albumin 3.8, AST 51, ALT 19, alkaline phosphatase 82, total bilirubin 0.8.   IMPRESSION:  1.  Syncopal episodes.  This is likely secondary to blood pressure-related      hypotension.  The patient will be admitted.  Initially want to enzymes      and follow up on her CT scan final report.  Will give her some IV      supplementation overnight.  Will hold her antihypertensive and will need      to clarify pharmacist from [place] where she gets her supplies and      possibly may need reduction in dosage.  2.  Leukocytosis.  There is no focal infection noted, and this may be stress      related, could be fluid as well.      Jackie Plum, M.D.  Electronically Signed     GO/MEDQ  D:  12/28/2004  T:  12/28/2004  Job:  [id number]   cc:   Lavonda Jumbo, M.D.  [address] [place], [place] [address]  Fax: [phone]

## 2010-09-19 NOTE — Op Note (Signed)
[place]  Patient:    Connie [name], Connie [name]                   MRN: [id number] Proc. Date: 07/25/99 Adm. Date:  [id number] Attending:  Janalyn Rouse CC:         Meredith Staggers, M.D.             Dr. Jess Barters                           Operative Report  PREOPERATIVE DIAGNOSIS:  Multicentric carcinoma of the right breast.  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  OPERATION:  Right modified radial mastectomy followed by TRAM reconstruction.  SURGEON:  Rose Phi. Maple Hudson, M.D.  ANESTHESIA:  General.  DESCRIPTION OF PROCEDURE:  After suitable general endotracheal anesthesia was induced, the patient was placed in the supine position and the entire chest, breasts, and abdomen prepped and draped in a standard fashion.  A transverse elliptical incision incorporating the nipple areolar complex on the right side nd the palpable two separate lesions was then outlined.  Incisions were then made nd the superior flap dissected to the level of the clavicle to the upper margin of  breast tissue, and then medially to near the sternum, and inferiorly to the inframammary fold at the rectus, and then laterally to the latissimus dorsi muscle.  We then removed the breast by dissecting from medial to lateral, incorporating he pectoralis fascia.  At the margin of the pectoralis major, we incised and retracted it, and dissected the tissue, and then incised the clavipectoral fascia, and along the pectoralis minor.  This exposed the level 2 lymph nodes, as well as the axillary vein.  We then swept all of the tissue from beneath the vein and deep o the pectoralis minor, incorporating the level 1 and level 2 nodes.  There were several obviously palpable lymph nodes.  The long thoracic nerve and the thoracodorsal nerves were identified and preserved.  Following removal of the specimen, we had  good hemostasis.  Teena Irani. Odis Luster, M.D. then came in to do the TRAM reconstruction which will be dictated in a separate note. DD:  07/25/99 TD:  07/25/99 Job: [id number]

## 2010-09-19 NOTE — Op Note (Signed)
[facility]  Patient:    Connie [name], Connie [name] Visit Number: [id number] MRN: [id number]          Service Type: Attending:  Verlin Grills, M.D. Dictated by:   Verlin Grills, M.D. Proc. Date: 05/23/01   CC:         Meredith Staggers, M.D.   Operative Report  PROCEDURE:  Screening colonoscopy with cecal polypectomy.  REFERRING PHYSICIAN:  Meredith Staggers, M.D.  PROCEDURE INDICATION:  Ms. Connie [name] is a 77 year old female born 15-Jul-1927. She is due for her first screening colonoscopy with polypectomy to prevent colon cancer.  I discussed with the patient the complications associated with colonoscopy and polypectomy, including a 15 per 1000 risk of bleeding and 4 per 1000 risk of colonic perforation requiring surgical repair. [name] has signed the operative permit.  ENDOSCOPIST:  Verlin Grills, M.D.  PREMEDICATION:  Demerol 50 mg, Versed 5 mg.  ENDOSCOPE:  Olympus Pediatric Colonoscope.  DESCRIPTION OF PROCEDURE:  After obtaining informed consent, [name] was placed in the left lateral decubitus position. I administered intravenous Versed and intravenous Demerol to achieve conscious sedation for the procedure. The patients blood pressure, oxygen saturation, and cardiac rhythm were monitored throughout the procedure and documented in the medical record.  Anal inspection was normal. The rectal exam was normal. The Olympus Pediatric video colonoscope was introduced into the rectum and easily advanced to the cecum. Colonic preparation for the exam today was excellent.  Rectum:  Normal.  Sigmoid colon and descending colon:  Normal.  Splenic flexure:  Normal.  Transverse colon:  Normal.  Hepatic flexure:  Normal.  Ascending colon:  Normal.  Cecum and ileocecal valve:  From the proximal cecum a 1-mm sessile polyp was removed with the cold snare and submitted for pathologic  interpretation.  ASSESSMENT:  From the proximal cecum a 1-mm sessile polyp was removed; otherwise, normal proctocolonoscopy to the cecum. Dictated by:   Verlin Grills, M.D. Attending:  Verlin Grills, M.D. DD:  05/23/01 TD:  05/24/01 Job: [id number]

## 2010-09-19 NOTE — Discharge Summary (Signed)
[place]  Patient:    Connie [name], Connie [name]                   MRN: [id number] Adm. Date:  [id number] Disc. Date: 07/30/99 Attending:  Janalyn Rouse                           Discharge Summary  FINAL DIAGNOSIS:  Right breast cancer.  PROCEDURE PERFORMED: 1. Right modified radical mastectomy. 2. Right breast reconstruction an ipsilateral transverse rectus abdominis    myocutaneous flap reconstruction.  HISTORY OF PRESENT ILLNESS:  A 77 year old woman who presented with a right breast cancer.  She had found this breast mass recently, two separate lesions actually  identified, and on ultrasound-guided biopsies, both proved to be carcinoma, poorly differentiated.  PAST MEDICAL HISTORY:  Positive for some hypertension controlled with Ziac and lso for some arthritis for which she takes Vioxx.  No history of breast cancer or ovarian cancer in the family.  PHYSICAL EXAMINATION:  There was a palpable mass at 7 oclock and 9 oclock position in the right breast.  It did appear that she had palpable axillary lymph nodes on that exam.  For further details of the history and physical, please see the chart.  HOSPITAL COURSE:  Initially, she was taken to surgery at which time the right modified radical mastectomy and right breast reconstruction were performed. She tolerated these procedures well.  Postoperatively, she did well.  She remained afebrile.  The TRAM flap did develop a little bit of ecchymosis and congestion at the very lateral aspect of the flap.  The majority of the flap, although it did have some mottling early on, this resolved.  Laterally, it has improved but there is a small area that may develop into an open wound out laterally.  Otherwise, the flap remains soft and has done very well.  The abdomen also appears to be healthy.  Drains were functioning and one of the drains is actually underneath the breast reconstruction,  and is draining only minimal scant amounts for the past 48 hours and is removed today. Axillary drain and abdominal drains remain in place.  It is felt that she is ready to be  discharged.  DISPOSITION:  She is discharged on diet as tolerated.  DISCHARGE MEDICATIONS: 1. Darvocet-N 100 one every 6 hours p.r.n. pain, a total of #12 given. 2. Carafate 1 g p.o. q.i.d. for what sounds like some heartburn. 3. Nystatin 5 cc swish and swallow twice a day for the next four days.  Antibiotics will be discontinued at this point since she has been on Cleocin now for five days.  ACTIVITY:  No lifting, no exercising.  WOUND CARE:  No shower yet and she is not to wear a bra.  Empty the drains three times a day and record the amounts.  FOLLOW-UP:  Will see her back in the office next week for recheck.  She has already seen Dr. Donnie Coffin, her oncologist, who will be organizing her postoperative chemotherapy. DD:  07/30/99 TD:  07/30/99 Job: [id number]

## 2010-09-19 NOTE — Op Note (Signed)
[place]  Patient:    Connie [name], Connie [name]                   MRN: [id number] Proc. Date: 07/25/99 Adm. Date:  [id number] Attending:  Janalyn Rouse                           Operative Report  PREOPERATIVE DIAGNOSIS:  Right breast carcinoma.  POSTOPERATIVE DIAGNOSIS:  Right breast carcinoma.  PROCEDURE PERFORMED:  Right breast reconstruction using an ipsilateral transverse rectus abdominis myocutaneous flap.  SURGEON:  Teena Irani. Odis Luster, M.D.  ANESTHESIA:  General.  ESTIMATED BLOOD LOSS:  150 cc.  DRAINS:  Four Blake drains left, two abdomen and two chest.  CLINICAL NOTE:  A 77 year old woman with multicentric right breast cancer for whom modified radical mastectomy had been recommended.  General surgery referred her for evaluation for breast reconstruction and for carefully counseling.  She elected to use her own tissue using the TRAM flap.  The nature of this procedure, the risks, and complications were discussed in great detail including a slightly increased  risk due to her advanced age.  She understood all of these risks and possible complications and wished to proceed.  DESCRIPTION OF PROCEDURE:  The patient was taken to the operating room and the general surgery portion of the procedure, right modified radical mastectomy had  been completed.  This had tolerated that well.  The incision was then made around the umbilicus and the umbilicus dissected free from surrounding tissue, leaving a generous fatty stalk of the umbilicus in order to preserve its blood supply. The upper incision for the planned elliptical incision was then made, and the dissection carried down through the subcutaneous tissue, beveling in a cephalad  direction.  Care was taken to avoid damage to the underlying rectus muscles, and dissection was continued up to the area of the xiphoid and connected through-and-through with the subcutaneous tunnel to  the right chest.  She was then checked for the closure, and the lower ______ of the planned incision was then made, and the dissection carried down through the subcutaneous tissue using the  electrocautery.  The left lateral aspect of the flap was then lifted up off the  underlying rectus muscles using electrocautery over the midline, and the right ide lifted up over to the lateral border of the rectus muscle.  Bipolar were used to elevate over to the lateral row of the perforating vessels.  Dissection was continued across the midline and just rarely across the midline.  Incisions were then made parallel using the scalpel through the anterior rectus fascia, leaving a 2.5 cm wide strip of fascia on the underlying right rectus muscle.  The rectus muscle was then gently dissected free from the surrounding fascial attachments using the bipolar and a scalpel as necessary, especially in the area of the tendinous inscriptions.  The muscle had been freed entirely.  The deep inferior  epigastric vessels were identified and the rectus muscle was then mobilized distal to the deep inferior epigastric vessels.  The rectus muscle was then divided using electrocautery.  Attention was then directed back to the right chest.  The wound was irrigated thoroughly with saline and meticulous hemostasis with electrocautery.  ______ were positioned and brought out through separate stab wounds inferiorly and laterally, and secured with 3-0 Prolene sutures.  Attention was then directed back to the abdomen where the deep inferior epigastric vessels were  identified, the artery triple ligated proximally and double ligated distally, and veins also double ligated proximally and distally, and divided. ll of zone 4 and a portion of zone 3 was then excised.  There was bright red bleeding from the flap edge.  The flap was then transferred through the subcutaneous tunnel up to the right chest, but was  temporarily secured with skin staples, and covered with a dry towel in order to preserve its warmth.  Attention was then directed back to the abdomen where there was thorough irrigation with saline, and excellent hemostasis having been confirmed, the fascial closure with 0 Prolene interrupted figure-of-eight sutures, taking great care to avoid ny damage to the underlying intra-abdominal contents.  The onlay mesh was then placed after first irrigating again with saline.  The mesh was placed, was bringing the umbilicus up through the mesh, and the mesh secured with 2-0 Prolene running simple suture around its periphery.  Thorough irrigation again with saline and excellent hemostasis having been confirmed, two Blake drains were positioned and brought ut through separate stab wounds inferiorly and secured with 3-0 Prolene sutures. he abdominal closure with 2-0 Vicryl in interrupted and inverted deep sutures.  An incision was then made in the abdominal wall as close to the midline as possible, and the umbilicus brought up through the _______ using 3-0 Vicryl interrupted and inverted deep sutures, and 5-0 nylon simple interrupted sutures.  Attention was then directed back to the chest where the TRAM flap appeared to have very nice color and capillary refill.  There did not appear to be any vascular compromise and it was under no tension.  The flap was marked for deepithelialization and the flap then suspended from the superior and medial chest wall using 3-0 Vicryl horizontal mattress sutures to the deepithelialized flap edge.  Flap insetting then completed with 3-0 Vicryl interrupted and inverted deep sutures, and a running 3-0 Vicryl subcuticular suture.  Again, the flap continued to have excellent color and excellent capillary refill.  She was transported to the recovery room in stable condition after tolerating the procedure well. DD:  07/25/99 TD:  07/28/99 Job:  [id number]

## 2010-09-19 NOTE — Op Note (Signed)
[place]  Patient:    Connie [name], KEMPE                   MRN: [id number] Proc. Date: 08/25/99 Adm. Date:  [id number] Attending:  Janalyn Rouse                           Operative Report  PREOPERATIVE DIAGNOSIS:  Extensive breast cancer on the right.  POSTOPERATIVE DIAGNOSIS:  Extensive breast cancer on the right.  OPERATION:  Insertion of implantable port.  SURGEON:  Rose Phi. Maple Hudson, M.D.  ANESTHESIA:  MAC.  DESCRIPTION OF PROCEDURE:  The patient was placed on the operating table and the left upper chest and neck prepped and draped in the usual fashion.  There was a  roll between the shoulders.  Under local anesthesia, I tried on several passes to make a left subclavian puncture and could only hit the artery.  I then asked J. Claybon Jabs, M.D. to  try, and he tried an IJ approach and could not get that, and then retried the subclavian approach and was able to get into the subclavian vein.  The wire was then passed and proper positioning of the wire was confirmed by fluoroscopy.  Also under local, we then made a pocket on the anterior chest wall for the implantable port.  We tunneled between the pocket in the subclavian area and then passed the preattached Davol catheter.  The implantable port was then fixed in the pocket with two 2-0 Prolene sutures.  We then appropriately cut the length of the catheter and then passed the dilator and peel-away sheath over the wire, and removed the wire, and then removed the dilator, and passed the catheter through the peel-away sheath and then removed it.  Proper position of the catheter and the tip were then reconfirmed by fluoroscopy.  The system was completely flushed and then heparinized with a needle left for access for chemotherapy tomorrow.  The implantable port site was then closed with 3-0 Vicryl and a subcuticular  4-0 Monocryl.  Steri-Strips were used for the rest of the  incisions.  With the system, access dressings were applied.  The patient was then transferred to the  recovery room in satisfactory condition having tolerated the procedure well.  A  chest x-ray will be obtained in the recovery room. DD:  08/25/99 TD:  08/25/99 Job: [id number]

## 2010-09-19 NOTE — Op Note (Signed)
NAME:  Connie [name], Connie [name]                      ACCOUNT NO.:  [id number]   MEDICAL RECORD NO.:  [id number]                   PATIENT TYPE:  AMB   LOCATION:  DAY                                  FACILITY:  [facility]   PHYSICIAN:  Sandria Bales. Ezzard Standing, M.D.               DATE OF BIRTH:  09-10-1927   DATE OF PROCEDURE:  05/10/2003  DATE OF DISCHARGE:                                 OPERATIVE REPORT   PREOPERATIVE DIAGNOSIS:  Chronic cholecystitis.   POSTOPERATIVE DIAGNOSIS:  1. Chronic cholecystitis with cholelithiasis.  2. 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   OPERATION/PROCEDURE:  1. Laparoscopic cholecystectomy and intraoperative cholangiogram.  2. Washings of peritoneal fluid.   SURGEON:  Sandria Bales. Ezzard Standing, M.D.   FIRST ASSISTANT:  Abigail Miyamoto, M.D.   ANESTHESIA:  General endotracheal anesthesia.   ESTIMATED BLOOD LOSS:  Minimal.   INDICATIONS:  [name] is a 77 year old white female who is a patient  of Dr. Arsenio Loader who comes with symptomatic cholelithiasis.  The patient  had a right TRAM flap which at least in part failed.  Discussed with her the  indications and potential complications of the surgery.  Potential  complications include but not limited to bleeding, infection, bile leak,  open abdominal surgery, and I do not know how much the TRAM flap itself with  interfere with trying to get into her abdominal cavity.  The patient agrees  with proceeding with the gallbladder surgery.   DESCRIPTION OF PROCEDURE:  The patient was taken to the operating room where  she underwent a general endotracheal anesthesia.  She is allergic to Keflex  so I tried giving her Cipro but she was only given 100 mg, had a rash,  complained of itching of her arms, so we stopped the antibiotics at that  point.  Gave her no further antibiotics.  Her abdomen was prepped with  Hibiclens.  Because she has a shellfish allergy, I was worried about giving  her Betadine as a prep.   She was sterilely draped.   I went through an infraumbilical incision into the abdominal cavity. I kind  of nicked part of the mesh, but actually got into the abdominal cavity  without a lot of difficulty and placing Hasson trocar which was 12 mm in  diameter.  I then carried out abdominal exploration.  The right and left  lobes of the liver were basically unremarkable and the gallbladder and scar  tissue around it.  What was remarkable was she had this thickened white  tissue which seemed to occupy her transverse colon/omentum.  It had a  scarring nature.  It did not appear malignant.  She has some other changes  in her what appeared to be small bowel.  I took photos of these and included  these in the chart.  Her right colon, however, her left colon and her  abdominal wall all looked unremarkable.  I could  actually even seen some  sutures that came through the anterior abdominal wall.   I then placed three additional trocars, a 10 mm trocar in the epigastric  area, a 5 mm trocar in the right mid subcostal, a 5 mm trocar in the right  lateral subcostal.  I grabbed the gallbladder, rotated it cephalad.  She did  have adhesions in the duodenum to the gallbladder wall and these were taken  down with sharp Bovie electrocautery.   I did find the cystic duct and shot an intraoperative cholangiogram.  I used  the Taut catheter inserted in the abdominal wall by using cut-off Taut  catheter inserted through a 14-gauge Jelco.  The cut put to the side of the  cut cystic duct and secured with __________  clip.   The cholangiogram was then shot using gastric opaque solution.  I used about  6 mL.  It showed free flow of dye down the cystic duct to the common bile  duct.  The __________  was tortuous but had no obstruction or filling defect  into the duodenum and that was normal.  It also refluxed both hepatic  radicals.   I then removed the Taut catheter, triply clipped the cystic duct and  divided  the cystic duct and a very small cystic artery which I double endo-clipped,  then divided and then removed the gallbladder using primarily the hook Bovie  coagulation and placed an EndoCatch bag.  Then irrigated the abdomen.  Actually I put about 600-700 mL of fluid in the abdomen and got about 3-4 mL  back.  I sent this off for cytology because of these peculiar changes in the  transverse colon.  Again this had a benign characteristic.  I do not know  whether this is due to prior chemotherapy or some other event that she had.  The trocars were removed in turn.  There was no bleeding at any trocar site.  I closed the umbilical port with a Z stitch.  I then closed the skin at each  port with a 5-0 Vicryl suture.  __________  Benzoin and Steri-Strips.   The patient tolerated the procedure well, was transported to the recovery  room in good condition.  Sponge and needle count were correct.                                               Sandria Bales. Ezzard Standing, M.D.    DHN/MEDQ  D:  05/10/2003  T:  05/10/2003  Job:  [id number]   cc:   Meredith Staggers, M.D.  [address]  [place]  [place] [address]  Fax: [phone]   Pierce Crane, M.D.  [address] - [facility]  [place]  [place] [address]  Fax: [phone]   Wynn Banker, M.D.  [address] - [facility]  [place]  [place] [address]  Fax: [phone]

## 2010-09-19 NOTE — Op Note (Signed)
[facility]  Patient:    Connie [name], Connie [name]                   MRN: [id number] Proc. Date: 10/20/00 Adm. Date:  [id number] Attending:  Melody Comas.                           Operative Report  PREOPERATIVE DIAGNOSIS:  Possible subcutaneous abscess, right anterior chest, status post transverse rectus abdominis myocutaneous flap revision.  POSTOPERATIVE DIAGNOSIS:  Possible subcutaneous abscess, right anterior chest, status post transverse rectus abdominis myocutaneous flap revision.  OPERATION PERFORMED:  Incision and drainage of subcutaneous abscess of right anterior chest, status post transverse rectus abdominis myocutaneous flap revision.  SURGEON:  Janet Berlin. Dan Humphreys, M.D.  ANESTHESIA:  Local.  INDICATIONS FOR PROCEDURE:  The patient is a 77 year old woman who recently underwent revision of her right breast reconstruction which had been performed with a TRAM flap.  Due to fairly intense radiation therapy, there was substantial fat necrosis of the flap which required debridement by Dr. Odis Luster. I saw the patient in the office yesterday in his absence and found a fluctuant, very erythematous pocket just above the transverse mastectomy incision.  This was aspirated in the office with removal of cloudy, purulent-appearing material.  We felt that this potentially represented a subcutaneous abscess and I am taking her to the operating room for incision and drainage.  DESCRIPTION OF PROCEDURE:  The patient was brought to the minor surgery operating room in the [facility].  I removed her existing Steri-Strips.  She has a sausage-shaped, approximately 5 to 6 cm in length fluctuant area just above the transverse mastectomy incision and oriented somewhat obliquely.  After sterilely prepping and draping the area, I infiltrated the overlying skin with 1% Xylocaine.  A linear incision was then made over the long axis of the fluctuant  mass.  I was able to easily express several ccs of cloudy, purulent-appearing material.  I used an 18 gauge needle on a hand held syringe to irrigate the cavity with crystalloid solution.  One-half inch iodoform gauze was then used to lightly pack the cavity and a sterile surgical gauze dressing applied.  This was immobilized in position with tape.  The patient is to leave the dressing in place and to see me in the office tomorrow for her initial dressing change.  She is already on oral antibiotics which I started yesterday. DD:  10/20/00 TD:  10/20/00 Job: [id number]

## 2010-09-19 NOTE — Op Note (Signed)
[facility]  Patient:    Connie [name], Connie [name]                   MRN: [id number] Proc. Date: 10/08/00 Adm. Date:  [id number] Disc. Date: [id number] Attending:  Chapman Moss                           Operative Report  PREOPERATIVE DIAGNOSES: 1. Radiation necrosis of the fat of a previous right transverse rectus    abdominus myocutaneous flap. 2. History of right breast cancer.  POSTOPERATIVE DIAGNOSES: 1. Radiation necrosis of the fat of a previous right transverse rectus    abdominus myocutaneous flap. 2. History of right breast cancer.  OPERATION:  Revision of a breast reconstruction.  SURGEON:  Teena Irani. Odis Luster, M.D.  ANESTHESIA:  General.  ESTIMATED BLOOD LOSS:  100 cc.  DRAINS:  1 Blake.  CLINICAL NOTE:  This 77 year old woman had a right breast cancer and underwent mastectomy with an immediate TRAM flap reconstruction.  She did well initially.  She did have approximately 20 lymph nodes that were positive.  She underwent chemotherapy and extensive radiation therapy.  During the course of radiation therapy, the TRAM flap became more firm and gradually receded over the period of the last five months to fat necrosis of the majority of the TRAM flap.  Skin paddle is healthy at the present time as is the rectus muscle on palpation at the lower quadrant.  Nevertheless, the TRAM flap has shrunk markedly and is deformed, firm, and she does prefer to have it revised so that she is left with just her mastectomy scar.  The alternative treatments were discussed with her including a latissimus flap with implant.  She understood this, but she wished to proceed with removal of the TRAM flap for the present since she has a number of plans for travel over the next year.  She understood the nature of the procedure and the risks and wishes to proceed.  DESCRIPTION OF PROCEDURE:  The patient was taken to the operating room and placed supine.  After  successful induction of general anesthesia, she was prepped with Betadine and draped with sterile drapes.  Incision then made in the old mastectomy flaps both superiorly and inferiorly around the TRAM flap skin paddle.  Dissection carried down through the subcutaneous tissue. Superior and inferior mastectomy flaps were then elevated using sharp dissection in order to avoid any cautery damage to the mastectomy flaps since the skin of her chest had been significantly changed and damaged by the radiation treatment.  The dissection was carried underneath the flap, dissecting the flap off the underlying pectoralis major muscle with care being taken to avoid damage to the pectoralis major muscle or to the underlying chest cavity.  The cautery was used to cross the rectus muscle inferiorly. Achieved hemostasis with electrocautery.  The TRAM flap was removed as a specimen.  There were no comparable masses on the chest wall.  There was clear irrigation with saline and hemostasis with electrocautery.  A blood drain was positioned and brought through a separate stab wound inferiorly and secured with a 3-0 Prolene suture.  Attention was then directed to closure.  The incisions were revised medially de-epithelizing the ______ and then laterally just excising it.  Again hemostasis was confirmed, and closure with 2-0 Vicryl interrupted deep sutures and running 3-0 Monocryl subcuticular suture.  The skin flaps appeared healthy with bright red bleeding at  their edges.  Capillary refill was appropriate. Steri-Strips and a very light fluff was applied over the wound to avoid any pressure on the radiation damaged skin flaps, and she was transferred to the recovery room in stabling condition having tolerated the procedure well.  DISPOSITION:  She will be observed overnight. DD:  10/08/00 TD:  10/08/00 Job: [id number]

## 2010-10-23 ENCOUNTER — Ambulatory Visit (INDEPENDENT_AMBULATORY_CARE_PROVIDER_SITE_OTHER): Payer: Medicare Other | Admitting: Family Medicine

## 2010-10-23 ENCOUNTER — Encounter: Payer: Self-pay | Admitting: Family Medicine

## 2010-10-23 VITALS — BP 136/70 | HR 80 | Ht 61.0 in | Wt 156.0 lb

## 2010-10-23 DIAGNOSIS — M899 Disorder of bone, unspecified: Secondary | ICD-10-CM

## 2010-10-23 DIAGNOSIS — E7800 Pure hypercholesterolemia, unspecified: Secondary | ICD-10-CM

## 2010-10-23 DIAGNOSIS — I10 Essential (primary) hypertension: Secondary | ICD-10-CM

## 2010-10-23 DIAGNOSIS — Z79899 Other long term (current) drug therapy: Secondary | ICD-10-CM

## 2010-10-23 DIAGNOSIS — M8580 Other specified disorders of bone density and structure, unspecified site: Secondary | ICD-10-CM

## 2010-10-23 LAB — COMPREHENSIVE METABOLIC PANEL
ALT: 15 U/L (ref 0–35)
AST: 20 U/L (ref 0–37)
Albumin: 3.9 g/dL (ref 3.5–5.2)
Alkaline Phosphatase: 84 U/L (ref 39–117)
BUN: 32 mg/dL — ABNORMAL HIGH (ref 6–23)
CO2: 28 mEq/L (ref 19–32)
Calcium: 9.9 mg/dL (ref 8.4–10.5)
Chloride: 103 mEq/L (ref 96–112)
Creat: 1.39 mg/dL — ABNORMAL HIGH (ref 0.50–1.10)
Glucose, Bld: 108 mg/dL — ABNORMAL HIGH (ref 70–99)
Potassium: 4 mEq/L (ref 3.5–5.3)
Sodium: 141 mEq/L (ref 135–145)
Total Bilirubin: 0.5 mg/dL (ref 0.3–1.2)
Total Protein: 6.2 g/dL (ref 6.0–8.3)

## 2010-10-23 NOTE — Progress Notes (Signed)
Subjective:    Patient ID: Connie [name], female    DOB: 1927/10/10, 77 y.o.   MRN: [id number]  HPI Patient presents to re-establish care.  Only significant change in her health from her last visit was that her blood pressure medication was doubled to 2 tablets daily.  Blood pressures have been much better on the higher dose.  Only occasionally checks her blood pressure elsewhere, but hasn't done it recently.  Denies headaches, chest pain, or dizziness.  Hyperlipidemia:  Has been on same medications for a while.  Can't recall when last lipids were checked.  Denies any side effects. Able to obtain copy of last labs from [facility]--03/12/2010.  Normal CBC, Vitamin D, TSH and Chem panel.  Chol 177, TG 120, LDL 101, HDL 46, ratio 3.89.  Breast Cancer:  Sees Dr. Donnie Coffin, and he recently stopped her Arimidex, having been on it for 11 years.  Mammogram is scheduled for August, and she isn't having any problems.  She has had some major dental problems for the last 2 months; had a root canal last week, complicated by an abscess.  She is currently taking Clindamycin 150mg  BID. Pain and swelling in her right upper jaw is much improved.  Past Medical History  Diagnosis Date  . Hypertension   . Hyperlipidemia   . Arthritis     OA spine, hands  . DDD (degenerative disc disease), lumbar   . Adenomatous colon polyp 1/03  . Abnormal brain MRI 8/01    small hemorrhagic stroke and possible cavernous hemangioma (Dr. Sandria Manly)  . Osteopenia     (DEXA's done by Dr. Donnie Coffin); prev took Fosamax.  No change in DEXA after off Fosamax x 2 years  . Postmenopausal bleeding 2000    benign EMB (Dr. Stefano Gaul)  . GERD (gastroesophageal reflux disease)   . Herpes zoster 2003  . Onychomycosis 2003, 2008    treated with Lamisil  . Breast cancer 2001    R breast (T3N1, ER/PR+, HER-2 +) s/p mastectomy, chemo and chest wall irradiation (Dr. Donnie Coffin)  . Urge urinary incontinence    Current outpatient prescriptions:aspirin 81 MG  tablet, Take 81 mg by mouth daily.  , Disp: , Rfl: ;  benazepril-hydrochlorthiazide (LOTENSIN HCT) 20-12.5 MG per tablet, Take 2 tablets by mouth daily.  , Disp: , Rfl: ;  calcium carbonate (TUMS - DOSED IN MG ELEMENTAL CALCIUM) 500 MG chewable tablet, Chew 1 tablet by mouth as needed.  , Disp: , Rfl:  Calcium Carbonate-Vitamin D (CALTRATE 600+D) 600-400 MG-UNIT per tablet, Take 2 tablets by mouth daily.  , Disp: , Rfl: ;  celecoxib (CELEBREX) 200 MG capsule, Take 200 mg by mouth daily.  , Disp: , Rfl: ;  Multiple Vitamins-Minerals (CENTRUM SILVER PO), Take 1 tablet by mouth daily.  , Disp: , Rfl: ;  omeprazole (PRILOSEC) 20 MG capsule, Take 20 mg by mouth as needed.  , Disp: , Rfl:  pravastatin (PRAVACHOL) 20 MG tablet, Take 20 mg by mouth daily.  , Disp: , Rfl: ;  solifenacin (VESICARE) 5 MG tablet, Take 10 mg by mouth daily.  , Disp: , Rfl: ;  clindamycin (CLEOCIN) 150 MG capsule, , Disp: , Rfl:   Allergies  Allergen Reactions  . Ciprofloxacin Itching  . Diclofenac Nausea Only    Upset stomach  . Keflex Itching  . Shellfish-Derived Products Nausea And Vomiting   Review of Systems Has some toe pain/cramps/arthritis. Also arthritis in hands and back, pain is relieved by Celebrex.  Denies fevers, weight  changes, edema.  +hot flashes (hoping they improve off of Arimidex).  Denies GI complaints, GU complaints, skin concerns, URI symptoms, chest pain, SOB or other problems    Objective:   Physical Exam  Well developed, well nourished patient, in no distress BP 136/70  Pulse 80  Ht 5\' 1"  (1.549 m)  Wt 156 lb (70.761 kg)  BMI 29.48 kg/m2 HEENT:  PERRL, EOMI, conjunctiva clear.  OP normal without erythema or lesions. Neck: No lymphadenopathy or thyromegaly, no carotid bruit Heart:  Regular rate and rhythm, no murmurs, rubs, gallops or ectopy Lungs:  Clear bilaterally, without wheezes, rales or ronchi Abdomen:  Soft, nontender, nondistended, no hepatosplenomegaly or masses, normal bowel  sounds Extremities:  No clubbing, cyanosis or edema, 2+ pulses. Wearing compression sleeve RUE. Neuro:  Alert and oriented x 3, cranial nerves grossly intact.  DTR's 2+ and symmetric.  Normal strength and sensation Back:  No spine or CVA tenderness Skin: no rashes or suspicious lesions Psych:  Normal mood, affect, hygiene and grooming, normal speech, eye contact       Assessment & Plan:   1. Essential hypertension, benign    2. Pure hypercholesterolemia    3. Encounter for long-term (current) use of other medications  Comprehensive metabolic panel  4. Osteopenia     BP is well controlled.  Lipids are at goal, due to be checked again at her CPE in November/December Continue Calcium, Vitamin D and weight-bearing exercise Check chem panel due to chronic Celebrex and statin use  F/u at CPE in 5-6 months

## 2010-10-27 ENCOUNTER — Telehealth: Payer: Self-pay | Admitting: *Deleted

## 2010-10-27 NOTE — Telephone Encounter (Signed)
Left msg for patient to return my call to go over labs.

## 2010-10-28 ENCOUNTER — Telehealth: Payer: Self-pay | Admitting: *Deleted

## 2010-10-28 ENCOUNTER — Other Ambulatory Visit: Payer: Self-pay | Admitting: *Deleted

## 2010-10-28 DIAGNOSIS — Z79899 Other long term (current) drug therapy: Secondary | ICD-10-CM

## 2010-10-28 NOTE — Telephone Encounter (Signed)
Spoke with patient [id number]. She scheduled lab visit for 12/29/10 @ 9:30 for BMET recheck (dx V58.69)

## 2010-10-29 ENCOUNTER — Encounter: Payer: Self-pay | Admitting: Family Medicine

## 2010-12-17 ENCOUNTER — Other Ambulatory Visit: Payer: Self-pay | Admitting: Oncology

## 2010-12-17 ENCOUNTER — Ambulatory Visit
Admission: RE | Admit: 2010-12-17 | Discharge: 2010-12-17 | Disposition: A | Discharge status: Home / Self Care | Payer: Medicare Other | Source: Ambulatory Visit | Attending: Oncology | Admitting: Oncology

## 2010-12-17 DIAGNOSIS — Z9011 Acquired absence of right breast and nipple: Secondary | ICD-10-CM

## 2010-12-29 ENCOUNTER — Other Ambulatory Visit: Payer: Medicare Other

## 2010-12-29 DIAGNOSIS — Z79899 Other long term (current) drug therapy: Secondary | ICD-10-CM

## 2010-12-29 LAB — BASIC METABOLIC PANEL
BUN: 31 mg/dL — ABNORMAL HIGH (ref 6–23)
CO2: 26 mEq/L (ref 19–32)
Calcium: 9.5 mg/dL (ref 8.4–10.5)
Chloride: 104 mEq/L (ref 96–112)
Creat: 1.19 mg/dL — ABNORMAL HIGH (ref 0.50–1.10)
Glucose, Bld: 95 mg/dL (ref 70–99)
Potassium: 4.2 mEq/L (ref 3.5–5.3)
Sodium: 142 mEq/L (ref 135–145)

## 2010-12-30 ENCOUNTER — Encounter: Payer: Self-pay | Admitting: Family Medicine

## 2011-03-30 ENCOUNTER — Other Ambulatory Visit: Payer: Self-pay | Admitting: Family Medicine

## 2011-03-30 MED ORDER — BENAZEPRIL-HYDROCHLOROTHIAZIDE 20-12.5 MG PO TABS: 2.0000 | ORAL_TABLET | Freq: Every day | ORAL | Status: DC

## 2011-03-30 MED ORDER — PRAVASTATIN SODIUM 20 MG PO TABS: 20.0000 mg | ORAL_TABLET | Freq: Every day | ORAL | Status: DC

## 2011-03-30 MED ORDER — SOLIFENACIN SUCCINATE 5 MG PO TABS: 5.0000 mg | ORAL_TABLET | Freq: Every day | ORAL | Status: DC

## 2011-03-30 NOTE — Telephone Encounter (Signed)
Spoke with patient she takes Vesicare 5mg  one daily. Called into pharmacy for patient #30 with no refills.

## 2011-03-30 NOTE — Telephone Encounter (Signed)
Pt called she has her first appt on 04/23/11 coming from [facility].  She is going to be out of Prevastatin 20 mg qhs, Benazapril/HCTZ 20/12.5 BID, Vesicare 5 mg  Qd to [facility] [phone].  She is req 1 mo supply until ov with you.

## 2011-03-30 NOTE — Telephone Encounter (Signed)
She has been seen here once already.  Ok for 1 month supply of meds.  Need to call pt to clarify the Vesicare--she is requesting 5mg  dose.  Chart says she takes 2/day?  ?if she needs 10mg  (because 5 mg isn't effective), or 5mg  #30.  Thanks

## 2011-04-23 ENCOUNTER — Ambulatory Visit
Admission: RE | Admit: 2011-04-23 | Discharge: 2011-04-23 | Disposition: A | Discharge status: Home / Self Care | Payer: Medicare Other | Source: Ambulatory Visit | Attending: Family Medicine | Admitting: Family Medicine

## 2011-04-23 ENCOUNTER — Ambulatory Visit (INDEPENDENT_AMBULATORY_CARE_PROVIDER_SITE_OTHER): Payer: Medicare Other | Admitting: Family Medicine

## 2011-04-23 ENCOUNTER — Encounter: Payer: Self-pay | Admitting: Family Medicine

## 2011-04-23 VITALS — BP 154/80 | HR 64 | Ht 61.0 in | Wt 158.0 lb

## 2011-04-23 DIAGNOSIS — M899 Disorder of bone, unspecified: Secondary | ICD-10-CM

## 2011-04-23 DIAGNOSIS — R1900 Intra-abdominal and pelvic swelling, mass and lump, unspecified site: Secondary | ICD-10-CM

## 2011-04-23 DIAGNOSIS — M8580 Other specified disorders of bone density and structure, unspecified site: Secondary | ICD-10-CM

## 2011-04-23 DIAGNOSIS — I10 Essential (primary) hypertension: Secondary | ICD-10-CM

## 2011-04-23 DIAGNOSIS — N3941 Urge incontinence: Secondary | ICD-10-CM

## 2011-04-23 DIAGNOSIS — Z79899 Other long term (current) drug therapy: Secondary | ICD-10-CM

## 2011-04-23 DIAGNOSIS — E7800 Pure hypercholesterolemia, unspecified: Secondary | ICD-10-CM

## 2011-04-23 DIAGNOSIS — M1990 Unspecified osteoarthritis, unspecified site: Secondary | ICD-10-CM

## 2011-04-23 DIAGNOSIS — Z0000 Encounter for general adult medical examination without abnormal findings: Secondary | ICD-10-CM

## 2011-04-23 LAB — POCT URINALYSIS DIPSTICK
Bilirubin, UA: NEGATIVE
Blood, UA: NEGATIVE
Glucose, UA: NEGATIVE
Ketones, UA: NEGATIVE
Leukocytes, UA: NEGATIVE
Nitrite, UA: NEGATIVE
Protein, UA: NEGATIVE
Spec Grav, UA: 1.01
Urobilinogen, UA: NEGATIVE
pH, UA: 6

## 2011-04-23 LAB — LIPID PANEL
Cholesterol: 169 mg/dL (ref 0–200)
HDL: 41 mg/dL (ref >39)
LDL Cholesterol: 97 mg/dL (ref 0–99)
Total CHOL/HDL Ratio: 4.1 Ratio
Triglycerides: 156 mg/dL — ABNORMAL HIGH (ref <150)
VLDL: 31 mg/dL (ref 0–40)

## 2011-04-23 LAB — COMPREHENSIVE METABOLIC PANEL
ALT: 22 U/L (ref 0–35)
AST: 24 U/L (ref 0–37)
Albumin: 4.2 g/dL (ref 3.5–5.2)
Alkaline Phosphatase: 66 U/L (ref 39–117)
BUN: 26 mg/dL — ABNORMAL HIGH (ref 6–23)
CO2: 29 mEq/L (ref 19–32)
Calcium: 10 mg/dL (ref 8.4–10.5)
Chloride: 102 mEq/L (ref 96–112)
Creat: 1.23 mg/dL — ABNORMAL HIGH (ref 0.50–1.10)
Glucose, Bld: 91 mg/dL (ref 70–99)
Potassium: 3.7 mEq/L (ref 3.5–5.3)
Sodium: 141 mEq/L (ref 135–145)
Total Bilirubin: 0.6 mg/dL (ref 0.3–1.2)
Total Protein: 6.3 g/dL (ref 6.0–8.3)

## 2011-04-23 MED ORDER — SOLIFENACIN SUCCINATE 5 MG PO TABS: 5.0000 mg | ORAL_TABLET | Freq: Every day | ORAL | Status: DC

## 2011-04-23 MED ORDER — BENAZEPRIL-HYDROCHLOROTHIAZIDE 20-12.5 MG PO TABS: 2.0000 | ORAL_TABLET | Freq: Every day | ORAL | Status: DC

## 2011-04-23 MED ORDER — CELECOXIB 200 MG PO CAPS: 200.0000 mg | ORAL_CAPSULE | Freq: Every day | ORAL | Status: DC

## 2011-04-23 MED ORDER — PRAVASTATIN SODIUM 20 MG PO TABS: 20.0000 mg | ORAL_TABLET | Freq: Every day | ORAL | Status: DC

## 2011-04-23 NOTE — Progress Notes (Signed)
Connie [name] is a 77 y.o. female who presents for a complete physical.  She has the following concerns: She is here for physical, and for 6 month med check. Arthritis--doing well with Celebrex HTN--not checking BP elsewhere recently. Denies headaches or dizziness Urge urinary incontinence--improved with Vesicare but still some urge incontinence Hyperlipidemia--on low cholesterol diet, compliant with pravastatin, denies side effect  Immunization History  Administered Date(s) Administered  . Influenza Split 02/02/2011  . Pneumococcal Polysaccharide 06/04/2005  . Tdap 06/04/2005  . Zoster 06/04/2005   Last Pap smear: 6/09 Last mammogram: 09/2010 Last colonoscopy: 12/2008 Last DEXA: 06/2009 per Dr. Donnie Coffin Dentist: regular Ophtho: yearly Exercise:  Limited in the last 3 weeks; usually exercises daily, walking 1.25 daily (takes her 30 minutes)  Past Medical History  Diagnosis Date  . Hypertension   . Hyperlipidemia   . Arthritis     OA spine, hands  . DDD (degenerative disc disease), lumbar   . Adenomatous colon polyp 1/03  . Abnormal brain MRI 8/01    small hemorrhagic stroke and possible cavernous hemangioma (Dr. Sandria Manly)  . Osteopenia     (DEXA's done by Dr. Donnie Coffin); prev took Fosamax.  No change in DEXA after off Fosamax x 2 years  . Postmenopausal bleeding 2000    benign EMB (Dr. Stefano Gaul)  . GERD (gastroesophageal reflux disease)   . Herpes zoster 2003  . Onychomycosis 2003, 2008    treated with Lamisil  . Breast cancer 2001    R breast (T3N1, ER/PR+, HER-2 +) s/p mastectomy, chemo and chest wall irradiation (Dr. Donnie Coffin)  . Urge urinary incontinence   . Ovarian cyst 12/2008    right    Past Surgical History  Procedure Date  . Tubal ligation   . Tonsillectomy   . Modified mastectomy and tram flap reconstruction 2001    R breast  . Tram flap removal     problems with flap after radiation  . Cholecystectomy, laparoscopic 1/05  . Colonoscopy w/ polypectomy 05/23/01   adenomatous polyp  . Colonoscopy 12/17/08    normal (Dr. Laural Benes)  . Cataract extraction summer 2011    bilateral  . Hearing loss     hearing aids 2012    History   Social History  . Marital Status: Married    Spouse Name: N/A    Number of Children: 3  . Years of Education: N/A   Occupational History  . Not on file.   Social History Main Topics  . Smoking status: Never Smoker   . Smokeless tobacco: Never Used  . Alcohol Use: No  . Drug Use: No  . Sexually Active: Not Currently     due to poor libido   Other Topics Concern  . Not on file   Social History Narrative   Lives with husband, cat.  1 daughter at [employer]--hospitalist; daughter in [place], son in [place]    Family History  Problem Relation Age of Onset  . Heart disease Mother   . Hypertension Mother   . Heart disease Father   . Hypertension Father   . Diabetes Brother   . Heart disease Brother   . Drug abuse Neg Hx   . Cancer Neg Hx    Current Outpatient Prescriptions on File Prior to Visit  Medication Sig Dispense Refill  . aspirin 81 MG tablet Take 81 mg by mouth daily.        . benazepril-hydrochlorthiazide (LOTENSIN HCT) 20-12.5 MG per tablet Take 2 tablets by mouth daily.  180 tablet  1  . calcium carbonate (TUMS - DOSED IN MG ELEMENTAL CALCIUM) 500 MG chewable tablet Chew 1 tablet by mouth as needed.        . Calcium Carbonate-Vitamin D (CALTRATE 600+D) 600-400 MG-UNIT per tablet Take 2 tablets by mouth daily.        . celecoxib (CELEBREX) 200 MG capsule Take 1 capsule (200 mg total) by mouth daily.  90 capsule  1  . Multiple Vitamins-Minerals (CENTRUM SILVER PO) Take 1 tablet by mouth daily.        Marland Kitchen omeprazole (PRILOSEC) 20 MG capsule Take 20 mg by mouth as needed.        . pravastatin (PRAVACHOL) 20 MG tablet Take 1 tablet (20 mg total) by mouth daily.  90 tablet  1  . solifenacin (VESICARE) 5 MG tablet Take 1 tablet (5 mg total) by mouth daily.  90 tablet  1   Allergies  Allergen Reactions  .  Ciprofloxacin Itching  . Diclofenac Nausea Only    Upset stomach  . Keflex Itching  . Shellfish-Derived Products Nausea And Vomiting   ROS: The patient denies anorexia, fever, weight changes, headaches,  vision changes, ear pain, sore throat, breast concerns, chest pain, palpitations, dizziness, syncope, dyspnea on exertion, cough, swelling, nausea, vomiting, diarrhea, constipation, abdominal pain, melena, hematochezia, indigestion/heartburn, hematuria, dysuria, vaginal bleeding, discharge, odor or itch, genital lesions, weakness, tremor, suspicious skin lesions, depression, anxiety, abnormal bleeding/bruising, or enlarged lymph nodes. Heartburn at night resolved with using omeprazole daily; ongoing numbness in fingers and toes since her chemo.  Some urge urinary incontinence (improved on Vesicare, but still has some). +joint pains in hands/fingers/feet--relieved by Celebrex. +hearing loss--got hearing aids this year and is much improved.  PHYSICAL EXAM: BP 154/80  Pulse 64  Ht 5\' 1"  (1.549 m)  Wt 158 lb (71.668 kg)  BMI 29.85 kg/m2 Repeat BP 204/84, clearly appeared anxious General Appearance:    Alert, cooperative, no distress, appears stated age  Head:    Normocephalic, without obvious abnormality, atraumatic  Eyes:    PERRL, conjunctiva/corneas clear, EOM's intact, fundi    benign  Ears:    Wearing hearing aids  Nose:   Nares normal, mucosa normal, no drainage or sinus   tenderness  Throat:   Lips, mucosa, and tongue normal; teeth and gums normal  Neck:   Supple, no lymphadenopathy;  thyroid:  no   enlargement/tenderness/nodules; no carotid   bruit or JVD  Back:    Spine nontender, no curvature, ROM normal, no CVA     tenderness  Lungs:     Clear to auscultation bilaterally without wheezes, rales or     ronchi; respirations unlabored  Chest Wall:    No tenderness or deformity   Heart:    Regular rate and rhythm, S1 and S2 normal, no murmur, rub   or gallop  Breast Exam:    R breast  absent. L breast normal--very mild tenderness, no masses, or nipple discharge or inversion.      No axillary lymphadenopathy  Abdomen:     Soft, non-tender, nondistended, normoactive bowel sounds,    no masses, no hepatosplenomegaly  Genitalia:    Normal external genitalia without lesions.  BUS and vagina normal; no cervical motion tenderness. No abnormal vaginal discharge.  Uterus normal size, nontender, no masses. R adnexal fullness compared to L (a lot of guarding on exam--somewhat difficult). Pap not performed  Rectal:    Normal tone, no masses or tenderness; guaiac negative stool  Extremities:   No clubbing, cyanosis or edema. Deformity to distal phalanx of L finger, arthritic changes  Pulses:   2+ and symmetric all extremities  Skin:   Skin color, texture, turgor normal, no rashes or lesions  Lymph nodes:   Cervical, supraclavicular, and axillary nodes normal  Neurologic:   CNII-XII intact, normal strength, sensation and gait; reflexes 2+ and symmetric throughout          Psych:   Normal mood, affect, hygiene and grooming.      ASSESSMENT/PLAN:  1. Routine general medical examination at a health care facility  Visual acuity screening, POCT Urinalysis Dipstick  2. Essential hypertension, benign  Comprehensive metabolic panel, benazepril-hydrochlorthiazide (LOTENSIN HCT) 20-12.5 MG per tablet  3. Osteopenia    4. Pure hypercholesterolemia  Lipid panel, pravastatin (PRAVACHOL) 20 MG tablet  5. Encounter for long-term (current) use of other medications  Comprehensive metabolic panel  6. OA (osteoarthritis)  celecoxib (CELEBREX) 200 MG capsule  7. Urge incontinence of urine  solifenacin (VESICARE) 5 MG tablet  8. Pelvic mass in female  US Transvaginal Non-OB, US Pelvis Complete   ?R adnexal fullness.  H/o ovarian cyst in past.  Re-check u/s BP elevated--check BP's at home, and mail/fax or drop off a list in the next month. White coat component to her elevated BP Pap not performed--due to  age, lack of risk factors DEXA will be due 06/2011 (ordered by Dr. Donnie Coffin)  Discussed monthly self breast exams and yearly mammograms; at least 30 minutes of aerobic activity at least 5 days/week; proper sunscreen use reviewed; healthy diet, including goals of calcium and vitamin D intake and alcohol recommendations (less than or equal to 1 drink/day) reviewed; regular seatbelt use; changing batteries in smoke detectors.  Immunization recommendations discussed--UTD.  Colonoscopy recommendations reviewed--UTD

## 2011-04-23 NOTE — Patient Instructions (Addendum)
HEALTH MAINTENANCE RECOMMENDATIONS:  It is recommended that you get at least 30 minutes of aerobic exercise at least 5 days/week (for weight loss, you may need as much as 60-90 minutes). This can be any activity that gets your heart rate up. This can be divided in 10-15 minute intervals if needed, but try and build up your endurance at least once a week.  Weight bearing exercise is also recommended twice weekly.  Eat a healthy diet with lots of vegetables, fruits and fiber.  "Colorful" foods have a lot of vitamins (ie green vegetables, tomatoes, red peppers, etc).  Limit sweet tea, regular sodas and alcoholic beverages, all of which has a lot of calories and sugar.  Up to 1 alcoholic drink daily may be beneficial for women (unless trying to lose weight, watch sugars).  Drink a lot of water.  Calcium recommendations are 1200-1500 mg daily (1500 mg for postmenopausal women or women without ovaries), and vitamin D 1000 IU daily.  This should be obtained from diet and/or supplements (vitamins), and calcium should not be taken all at once, but in divided doses.  Monthly self breast exams and yearly mammograms for women over the age of 39 is recommended.  Sunscreen of at least SPF 30 should be used on all sun-exposed parts of the skin when outside between the hours of 10 am and 4 pm (not just when at beach or pool, but even with exercise, golf, tennis, and yard work!)  Use a sunscreen that says "broad spectrum" so it covers both UVA and UVB rays, and make sure to reapply every 1-2 hours.  Remember to change the batteries in your smoke detectors when changing your clock times in the spring and fall.  Use your seat belt every time you are in a car, and please drive safely and not be distracted with cell phones and texting while driving.  check BP's at home, and mail/fax or drop off a list in the next month.

## 2011-04-24 ENCOUNTER — Encounter: Payer: Self-pay | Admitting: Family Medicine

## 2011-04-24 DIAGNOSIS — M1990 Unspecified osteoarthritis, unspecified site: Secondary | ICD-10-CM | POA: Insufficient documentation

## 2011-04-24 DIAGNOSIS — N3941 Urge incontinence: Secondary | ICD-10-CM | POA: Insufficient documentation

## 2011-08-05 ENCOUNTER — Telehealth: Payer: Self-pay | Admitting: Family Medicine

## 2011-08-05 NOTE — Telephone Encounter (Signed)
Patient dropped off bp reading, I put them on your shelf. Can you please review them and let me know if it is okay to refill her benazepril 20/12.5 to [facility] [address]

## 2011-08-05 NOTE — Telephone Encounter (Signed)
Called patient and let her know that Dr.Knapp reviewed bp readings. Also I called pharmacy and Dr.Knapp did rx #180 with 1 refill, [facility] stated that they would go ahead and fill for her. Called patient and let her know.

## 2011-08-05 NOTE — Telephone Encounter (Signed)
BP's reviewed--wide fluctuations (from 117-182/67-89), averaging 130-140 systolic.  Okay to refill med, although shouldn't need it--done for #180 with 1 refill on 12/20

## 2011-09-16 ENCOUNTER — Telehealth: Payer: Self-pay | Admitting: Oncology

## 2011-09-16 NOTE — Telephone Encounter (Signed)
S/w the pt and she is aware of her July 2013 appts °

## 2011-10-26 ENCOUNTER — Other Ambulatory Visit: Payer: Self-pay | Admitting: Family Medicine

## 2011-11-12 ENCOUNTER — Other Ambulatory Visit (HOSPITAL_BASED_OUTPATIENT_CLINIC_OR_DEPARTMENT_OTHER): Payer: Medicare Other | Admitting: Lab

## 2011-11-12 DIAGNOSIS — C50919 Malignant neoplasm of unspecified site of unspecified female breast: Secondary | ICD-10-CM

## 2011-11-12 DIAGNOSIS — Z170 Estrogen receptor positive status [ER+]: Secondary | ICD-10-CM

## 2011-11-12 LAB — CBC WITH DIFFERENTIAL/PLATELET
BASO%: 0.2 % (ref 0.0–2.0)
Basophils Absolute: 0 10*3/uL (ref 0.0–0.1)
EOS%: 1.6 % (ref 0.0–7.0)
Eosinophils Absolute: 0.1 10*3/uL (ref 0.0–0.5)
HCT: 39.9 % (ref 34.8–46.6)
HGB: 13.4 g/dL (ref 11.6–15.9)
LYMPH%: 21.9 % (ref 14.0–49.7)
MCH: 30 pg (ref 25.1–34.0)
MCHC: 33.6 g/dL (ref 31.5–36.0)
MCV: 89.2 fL (ref 79.5–101.0)
MONO#: 0.5 10*3/uL (ref 0.1–0.9)
MONO%: 6.4 % (ref 0.0–14.0)
NEUT#: 4.9 10*3/uL (ref 1.5–6.5)
NEUT%: 69.9 % (ref 38.4–76.8)
Platelets: 141 10*3/uL — ABNORMAL LOW (ref 145–400)
RBC: 4.47 10*6/uL (ref 3.70–5.45)
RDW: 14.1 % (ref 11.2–14.5)
WBC: 7 10*3/uL (ref 3.9–10.3)
lymph#: 1.5 10*3/uL (ref 0.9–3.3)

## 2011-11-13 LAB — COMPREHENSIVE METABOLIC PANEL
ALT: 16 U/L (ref 0–35)
AST: 19 U/L (ref 0–37)
Albumin: 4 g/dL (ref 3.5–5.2)
Alkaline Phosphatase: 69 U/L (ref 39–117)
BUN: 39 mg/dL — ABNORMAL HIGH (ref 6–23)
CO2: 29 mEq/L (ref 19–32)
Calcium: 9.9 mg/dL (ref 8.4–10.5)
Chloride: 101 mEq/L (ref 96–112)
Creatinine, Ser: 1.43 mg/dL — ABNORMAL HIGH (ref 0.50–1.10)
Glucose, Bld: 107 mg/dL — ABNORMAL HIGH (ref 70–99)
Potassium: 3.9 mEq/L (ref 3.5–5.3)
Sodium: 139 mEq/L (ref 135–145)
Total Bilirubin: 0.6 mg/dL (ref 0.3–1.2)
Total Protein: 6.2 g/dL (ref 6.0–8.3)

## 2011-11-13 LAB — VITAMIN D 25 HYDROXY (VIT D DEFICIENCY, FRACTURES): Vit D, 25-Hydroxy: 67 ng/mL (ref 30–89)

## 2011-11-13 LAB — CANCER ANTIGEN 27.29: CA 27.29: 19 U/mL (ref 0–39)

## 2011-11-16 ENCOUNTER — Encounter: Payer: Self-pay | Admitting: Family Medicine

## 2011-11-16 ENCOUNTER — Ambulatory Visit (INDEPENDENT_AMBULATORY_CARE_PROVIDER_SITE_OTHER): Payer: Medicare Other | Admitting: Family Medicine

## 2011-11-16 VITALS — BP 130/70 | HR 80 | Ht 61.0 in | Wt 160.0 lb

## 2011-11-16 DIAGNOSIS — N3941 Urge incontinence: Secondary | ICD-10-CM

## 2011-11-16 DIAGNOSIS — Z79899 Other long term (current) drug therapy: Secondary | ICD-10-CM

## 2011-11-16 DIAGNOSIS — I10 Essential (primary) hypertension: Secondary | ICD-10-CM

## 2011-11-16 DIAGNOSIS — E7800 Pure hypercholesterolemia, unspecified: Secondary | ICD-10-CM

## 2011-11-16 DIAGNOSIS — M1990 Unspecified osteoarthritis, unspecified site: Secondary | ICD-10-CM

## 2011-11-16 MED ORDER — BENAZEPRIL-HYDROCHLOROTHIAZIDE 20-12.5 MG PO TABS: 2.0000 | ORAL_TABLET | Freq: Every day | ORAL | Status: DC

## 2011-11-16 MED ORDER — PRAVASTATIN SODIUM 20 MG PO TABS: 20.0000 mg | ORAL_TABLET | Freq: Every day | ORAL | Status: DC

## 2011-11-16 MED ORDER — SOLIFENACIN SUCCINATE 5 MG PO TABS: 5.0000 mg | ORAL_TABLET | Freq: Every day | ORAL | Status: DC

## 2011-11-16 MED ORDER — CELECOXIB 200 MG PO CAPS: 200.0000 mg | ORAL_CAPSULE | Freq: Every day | ORAL | Status: DC

## 2011-11-16 NOTE — Patient Instructions (Signed)
Try and drink more fluids--at least 6-8 (ideally more, in the summer heat) non-caffeinated, non-alcoholic beverages.  Your calcium intake should ideally be 1500mg  TOTAL--from your multi-vitamin, calcium supplement and your dietary sources.  You do NOT need to get ALL of the calcium from your vitamins--please consider your dietary intake in the total.
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
## 2011-11-19 ENCOUNTER — Ambulatory Visit (HOSPITAL_BASED_OUTPATIENT_CLINIC_OR_DEPARTMENT_OTHER): Payer: Medicare Other | Admitting: Oncology

## 2011-11-19 VITALS — BP 129/96 | HR 96 | Temp 97.9°F | Ht 61.0 in | Wt 160.1 lb

## 2011-11-19 DIAGNOSIS — C773 Secondary and unspecified malignant neoplasm of axilla and upper limb lymph nodes: Secondary | ICD-10-CM

## 2011-11-19 DIAGNOSIS — C50919 Malignant neoplasm of unspecified site of unspecified female breast: Secondary | ICD-10-CM

## 2011-11-19 DIAGNOSIS — M899 Disorder of bone, unspecified: Secondary | ICD-10-CM

## 2011-11-19 DIAGNOSIS — Z170 Estrogen receptor positive status [ER+]: Secondary | ICD-10-CM

## 2011-11-19 DIAGNOSIS — E559 Vitamin D deficiency, unspecified: Secondary | ICD-10-CM

## 2011-11-19 DIAGNOSIS — Z1231 Encounter for screening mammogram for malignant neoplasm of breast: Secondary | ICD-10-CM

## 2011-11-19 NOTE — Progress Notes (Signed)
Hematology and Oncology Follow Up Visit  Connie [name] [id number] 01/20/1928 78 y.o. 11/19/2011 3:21 PM   DIAGNOSIS:     PAST THERAPY:  PROBLEM LIST:   1. 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 2. Status post TRAM flap reconstruction with subsequent removal on Arimidex 1 mg a day since February 2002. 3. History of osteopenia, on Fosamax and  vitamin D.  Interim History:  Patient returns for followup. She is about 12 years out from diagnosis. She is doing well. She went on a cruise to celebrate her 36 that when chemistry. She's had no intercurrent illnesses. Last mammogram done in August of last year was normal. She is due for follow bone density test in 2014. She continues on Celebrex. There've been some issues around renal insufficiency.  Medications: I have reviewed the patient's current medications.  Allergies:  Allergies  Allergen Reactions  . Cephalexin Itching  . Ciprofloxacin Itching  . Diclofenac Nausea Only    Upset stomach  . Shellfish-Derived Products Nausea And Vomiting    Past Medical History, Surgical history, Social history, and Family History were reviewed and updated.  Review of Systems: Constitutional:  Negative for fever, chills, night sweats, anorexia, weight loss, pain. Cardiovascular: no chest pain or dyspnea on exertion Respiratory: negative Neurological: negative Dermatological: negative ENT: negative Skin Gastrointestinal: negative Genito-Urinary: negative Hematological and Lymphatic: negative Breast: negative Musculoskeletal: negative Remaining ROS negative.  Physical Exam:  Blood pressure 129/96, pulse 96, temperature 97.9 F (36.6 C), height 5\' 1"  (1.549 m), weight 160 lb 1.6 oz (72.621 kg).  ECOG: 0  HEENT:  Sclerae anicteric, conjunctivae pink.  Oropharynx  clear.  No mucositis or candidiasis.  Nodes:  No cervical, supraclavicular, or axillary lymphadenopathy palpated.  Breast Exam: Right breast status post mastectomy on that side no evidence of local recurrence. Left breast exam was normal...  Lungs:  Clear to auscultation bilaterally.  No crackles, rhonchi, or wheezes.  Heart:  Regular rate and rhythm.  Abdomen:  Soft, nontender.  Positive bowel sounds.  No organomegaly or masses palpated.  Musculoskeletal:  No focal spinal tenderness to palpation.  Extremities:  Benign.  No peripheral edema or cyanosis.  Skin:  Benign.  Neuro:  Nonfocal.     Lab Results: Lab Results  Component Value Date   WBC 7.0 11/12/2011   HGB 13.4 11/12/2011   HCT 39.9 11/12/2011   MCV 89.2 11/12/2011   PLT 141* 11/12/2011     Chemistry      Component Value Date/Time   NA 139 11/12/2011 1405   K 3.9 11/12/2011 1405   CL 101 11/12/2011 1405   CO2 29 11/12/2011 1405   BUN 39* 11/12/2011 1405   CREATININE 1.43* 11/12/2011 1405   CREATININE 1.23* 04/23/2011 1013      Component Value Date/Time   CALCIUM 9.9 11/12/2011 1405   ALKPHOS 69 11/12/2011 1405   AST 19 11/12/2011 1405   ALT 16 11/12/2011 1405   BILITOT 0.6 11/12/2011 1405       Radiological Studies:  No results found.   IMPRESSIONS AND PLAN: A 78 y.o. female with   History of multinode-positive HER-2 positive breast cancer and received adjuvant Herceptin based therapy. Status post radiation therapy to chest wall followed by 10 years for next therapy now on regular followup annually. Patient is doing well no evidence of recurrence. He'll  continue to see her normal basis. We have scheduled her imaging studies she knows to call should she have any other concerns.  Spent more than half the time coordinating care, as well as discussion of BMI and its implications.      Omar Gayden 7/18/20133:21 PM Cell [phone]

## 2011-11-19 NOTE — Patient Instructions (Addendum)
pls take peridin c , 2 pills  Twice a day.. Available as over the counter medicine.

## 2011-11-20 ENCOUNTER — Telehealth: Payer: Self-pay | Admitting: Oncology

## 2011-11-20 NOTE — Telephone Encounter (Signed)
lmonvm adviisng the pt of her mammo/bone density appt in aug at [facility]

## 2011-12-23 ENCOUNTER — Ambulatory Visit
Admission: RE | Admit: 2011-12-23 | Discharge: 2011-12-23 | Disposition: A | Discharge status: Home / Self Care | Payer: Medicare Other | Source: Ambulatory Visit | Attending: Oncology | Admitting: Oncology

## 2011-12-23 DIAGNOSIS — C50919 Malignant neoplasm of unspecified site of unspecified female breast: Secondary | ICD-10-CM

## 2011-12-23 DIAGNOSIS — Z1231 Encounter for screening mammogram for malignant neoplasm of breast: Secondary | ICD-10-CM

## 2011-12-23 DIAGNOSIS — E559 Vitamin D deficiency, unspecified: Secondary | ICD-10-CM

## 2011-12-23 LAB — HM DEXA SCAN

## 2011-12-24 LAB — HM MAMMOGRAPHY: HM Mammogram: NORMAL

## 2012-02-11 ENCOUNTER — Other Ambulatory Visit: Payer: Self-pay | Admitting: Chiropractor

## 2012-02-11 ENCOUNTER — Ambulatory Visit
Admission: RE | Admit: 2012-02-11 | Discharge: 2012-02-11 | Disposition: A | Discharge status: Home / Self Care | Payer: Medicare Other | Source: Ambulatory Visit | Attending: Chiropractor | Admitting: Chiropractor

## 2012-02-11 DIAGNOSIS — R52 Pain, unspecified: Secondary | ICD-10-CM

## 2012-04-15 ENCOUNTER — Other Ambulatory Visit: Payer: Medicare Other

## 2012-04-15 DIAGNOSIS — Z79899 Other long term (current) drug therapy: Secondary | ICD-10-CM

## 2012-04-15 DIAGNOSIS — E7800 Pure hypercholesterolemia, unspecified: Secondary | ICD-10-CM

## 2012-04-15 DIAGNOSIS — I10 Essential (primary) hypertension: Secondary | ICD-10-CM

## 2012-04-15 LAB — COMPREHENSIVE METABOLIC PANEL
ALT: 17 U/L (ref 0–35)
AST: 21 U/L (ref 0–37)
Albumin: 4.2 g/dL (ref 3.5–5.2)
Alkaline Phosphatase: 65 U/L (ref 39–117)
BUN: 33 mg/dL — ABNORMAL HIGH (ref 6–23)
CO2: 30 mEq/L (ref 19–32)
Calcium: 9.5 mg/dL (ref 8.4–10.5)
Chloride: 100 mEq/L (ref 96–112)
Creat: 1.24 mg/dL — ABNORMAL HIGH (ref 0.50–1.10)
Glucose, Bld: 93 mg/dL (ref 70–99)
Potassium: 3.9 mEq/L (ref 3.5–5.3)
Sodium: 140 mEq/L (ref 135–145)
Total Bilirubin: 0.5 mg/dL (ref 0.3–1.2)
Total Protein: 6.1 g/dL (ref 6.0–8.3)

## 2012-04-15 LAB — LIPID PANEL
Cholesterol: 161 mg/dL (ref 0–200)
HDL: 42 mg/dL (ref >39)
LDL Cholesterol: 90 mg/dL (ref 0–99)
Total CHOL/HDL Ratio: 3.8 Ratio
Triglycerides: 143 mg/dL (ref <150)
VLDL: 29 mg/dL (ref 0–40)

## 2012-04-16 LAB — TSH: TSH: 4.217 u[IU]/mL (ref 0.350–4.500)

## 2012-04-21 ENCOUNTER — Other Ambulatory Visit: Payer: Medicare Other

## 2012-04-25 ENCOUNTER — Ambulatory Visit (INDEPENDENT_AMBULATORY_CARE_PROVIDER_SITE_OTHER): Payer: Medicare Other | Admitting: Family Medicine

## 2012-04-25 ENCOUNTER — Encounter: Payer: Self-pay | Admitting: Family Medicine

## 2012-04-25 VITALS — BP 150/84 | HR 80 | Ht 61.0 in | Wt 158.0 lb

## 2012-04-25 DIAGNOSIS — M1990 Unspecified osteoarthritis, unspecified site: Secondary | ICD-10-CM

## 2012-04-25 DIAGNOSIS — E7800 Pure hypercholesterolemia, unspecified: Secondary | ICD-10-CM

## 2012-04-25 DIAGNOSIS — I10 Essential (primary) hypertension: Secondary | ICD-10-CM

## 2012-04-25 DIAGNOSIS — Z0000 Encounter for general adult medical examination without abnormal findings: Secondary | ICD-10-CM

## 2012-04-25 DIAGNOSIS — N3941 Urge incontinence: Secondary | ICD-10-CM

## 2012-04-25 LAB — POCT URINALYSIS DIPSTICK
Bilirubin, UA: NEGATIVE
Blood, UA: NEGATIVE
Glucose, UA: NEGATIVE
Ketones, UA: NEGATIVE
Leukocytes, UA: NEGATIVE
Nitrite, UA: NEGATIVE
Protein, UA: NEGATIVE
Spec Grav, UA: 1.02
Urobilinogen, UA: NEGATIVE
pH, UA: 5

## 2012-04-25 MED ORDER — BENAZEPRIL-HYDROCHLOROTHIAZIDE 20-12.5 MG PO TABS: 2.0000 | ORAL_TABLET | Freq: Every day | ORAL | Status: DC

## 2012-04-25 MED ORDER — SOLIFENACIN SUCCINATE 5 MG PO TABS: 5.0000 mg | ORAL_TABLET | Freq: Every day | ORAL | Status: DC

## 2012-04-25 MED ORDER — PRAVASTATIN SODIUM 20 MG PO TABS: 20.0000 mg | ORAL_TABLET | Freq: Every day | ORAL | Status: DC

## 2012-04-25 MED ORDER — CELECOXIB 200 MG PO CAPS: 200.0000 mg | ORAL_CAPSULE | Freq: Every day | ORAL | Status: DC

## 2012-04-25 NOTE — Progress Notes (Signed)
Chief Complaint  Patient presents with  . Annual Exam    annual exam, labs done 04/15/12 with pap. Has some questions that she would like to ask you, has 5 of them.(see sheet on front of chart) Also wanted to let you know that she got hearing aids last November.   Connie [name] is a 78 y.o. female who presents for a complete physical.  She has the following concerns:  Arthritis--doing well with Celebrex.  She is worried about not being allowed to have refill, due to abnormal kidney test last time.  She has been drinking more fluids, and med is very effective.  HTN--not checking BP elsewhere recently. Denies headaches or dizziness. BP's fluctuate a lot.  She is going overseas on a trip for the month of January.  Urge urinary incontinence--improved with Vesicare initially, but seems worse recently.  Wondering if she can try another medication. She recalls taking something else in the past, but can't recall name of med.  Has been on vesicare 5mg  for many years.  Has a lot of urgency, no dysuria.  Urgency is extreme, and sometimes has incontinence.  She has never tried higher dose of vesicare.  Hyperlipidemia--on low cholesterol diet, compliant with pravastatin, denies side effect .  Upcoming travel--she went to travel clinic.  Plans to start lariam, and got yellow fever shot.  Immunization History  Administered Date(s) Administered  . Influenza Split 02/02/2011, 02/02/2012  . Pneumococcal Polysaccharide 06/04/2005  . Tdap 06/04/2005  . Zoster 06/04/2005   Last Pap smear: 6/09 Had pelvic u/s last year, normal, ovariesnot visualized Last mammogram: 12/2011 Last colonoscopy: 12/2008 Last DEXA: per Dr. Donnie Coffin, 12/2011 Dentist: regular  Ophtho: yearly  Exercise:  walking 1.25 daily (takes her 30 minutes)  Past Medical History  Diagnosis Date  . Hypertension   . Hyperlipidemia   . Arthritis     OA spine, hands  . DDD (degenerative disc disease), lumbar   . Adenomatous colon polyp 1/03   . Abnormal brain MRI 8/01    small hemorrhagic stroke and possible cavernous hemangioma (Dr. Sandria Manly)  . Osteopenia     (DEXA's done by Dr. Donnie Coffin); prev took Fosamax.  No change in DEXA after off Fosamax x 2 years  . Postmenopausal bleeding 2000    benign EMB (Dr. Stefano Gaul)  . GERD (gastroesophageal reflux disease)   . Herpes zoster 2003  . Onychomycosis 2003, 2008    treated with Lamisil  . Breast cancer 2001    R breast (T3N1, ER/PR+, HER-2 +) s/p mastectomy, chemo and chest wall irradiation (Dr. Donnie Coffin)  . Urge urinary incontinence   . Ovarian cyst 12/2008    right    Past Surgical History  Procedure Date  . Tubal ligation   . Tonsillectomy   . Modified mastectomy and tram flap reconstruction 2001    R breast  . Tram flap removal     problems with flap after radiation  . Cholecystectomy, laparoscopic 1/05  . Colonoscopy w/ polypectomy 05/23/01    adenomatous polyp  . Colonoscopy 12/17/08    normal (Dr. Laural Benes)  . Cataract extraction summer 2011    bilateral  . Hearing loss     hearing aids 2012    History   Social History  . Marital Status: Married    Spouse Name: N/A    Number of Children: 3  . Years of Education: N/A   Occupational History  . Not on file.   Social History Main Topics  . Smoking status:  Never Smoker   . Smokeless tobacco: Never Used  . Alcohol Use: No  . Drug Use: No  . Sexually Active: Not Currently     Comment: due to poor libido   Other Topics Concern  . Not on file   Social History Narrative   Lives with husband, cat.  1 daughter at [employer]--hospitalist; daughter in [place], son in [place]    Family History  Problem Relation Age of Onset  . Heart disease Mother   . Hypertension Mother   . Heart disease Father   . Hypertension Father   . Diabetes Brother   . Heart disease Brother   . Drug abuse Neg Hx   . Cancer Neg Hx     Current outpatient prescriptions:aspirin 81 MG tablet, Take 81 mg by mouth daily.  , Disp: , Rfl: ;   benazepril-hydrochlorthiazide (LOTENSIN HCT) 20-12.5 MG per tablet, Take 2 tablets by mouth daily., Disp: 180 tablet, Rfl: 1;  Calcium Carbonate-Vitamin D (CALTRATE 600+D) 600-400 MG-UNIT per tablet, Take 2 tablets by mouth daily.  , Disp: , Rfl:  celecoxib (CELEBREX) 200 MG capsule, Take 1 capsule (200 mg total) by mouth daily., Disp: 90 capsule, Rfl: 1;  Multiple Vitamins-Minerals (CENTRUM SILVER PO), Take 1 tablet by mouth daily.  , Disp: , Rfl: ;  omeprazole (PRILOSEC) 20 MG capsule, Take 20 mg by mouth as needed.  , Disp: , Rfl: ;  pravastatin (PRAVACHOL) 20 MG tablet, Take 1 tablet (20 mg total) by mouth daily., Disp: 90 tablet, Rfl: 1 solifenacin (VESICARE) 5 MG tablet, Take 1 tablet (5 mg total) by mouth daily., Disp: 90 tablet, Rfl: 1;  [DISCONTINUED] solifenacin (VESICARE) 5 MG tablet, Take 1 tablet (5 mg total) by mouth daily., Disp: 90 tablet, Rfl: 0;  calcium carbonate (TUMS - DOSED IN MG ELEMENTAL CALCIUM) 500 MG chewable tablet, Chew 1 tablet by mouth as needed.  , Disp: , Rfl:   Allergies  Allergen Reactions  . Cephalexin Itching  . Ciprofloxacin Itching  . Diclofenac Nausea Only    Upset stomach  . Shellfish-Derived Products Nausea And Vomiting   ROS: The patient denies anorexia, fever, weight changes, headaches, vision changes, ear pain, sore throat, breast concerns, chest pain, palpitations, dizziness, syncope, dyspnea on exertion, cough, swelling, nausea, vomiting, diarrhea, constipation, abdominal pain, melena, hematochezia, indigestion/heartburn, hematuria, dysuria, vaginal bleeding, discharge, odor or itch, genital lesions, weakness, tremor, suspicious skin lesions, depression, anxiety, abnormal bleeding/bruising, or enlarged lymph nodes.  Heartburn at night resolved with using omeprazole daily; ongoing numbness in fingers and toes since her chemo. +urge urinary incontinence, not adequately controlled with vesicare (see HPI).Marland Kitchen +joint pains in hands/fingers/feet--relieved by  Celebrex. +hearing loss--has hearing aids.  Denies any thyroid symptoms.  PHYSICAL EXAM:  BP 150/84  Pulse 80  Ht 5\' 1"  (1.549 m)  Wt 158 lb (71.668 kg)  BMI 29.85 kg/m2 182/80 on repeat.  Talkative, anxious General Appearance:  Alert, cooperative, no distress, appears stated age   Head:  Normocephalic, without obvious abnormality, atraumatic   Eyes:  PERRL, conjunctiva/corneas clear, EOM's intact, fundi  benign   Ears:  Wearing hearing aids . Upon removal, TM's and EAC's normal  Nose:  Nares normal, mucosa normal, no drainage or sinus tenderness   Throat:  Lips, mucosa, and tongue normal; teeth and gums normal   Neck:  Supple, no lymphadenopathy; thyroid: no enlargement/tenderness/nodules; no carotid  bruit or JVD   Back:  Spine nontender, no curvature, ROM normal, no CVA tenderness   Lungs:  Clear  to auscultation bilaterally without wheezes, rales or ronchi; respirations unlabored   Chest Wall:  No tenderness or deformity   Heart:  Regular rate and rhythm, S1 and S2 normal, no murmur, rub  or gallop   Breast Exam:  R breast absent, well healed surgical scars, nontender. L breast normal-- no masses, or nipple discharge or inversion (nipple is mildly inverted, chronic). No axillary lymphadenopathy   Abdomen:  Soft, non-tender, nondistended, normoactive bowel sounds,  no masses, no hepatosplenomegaly   Genitalia:  Normal external genitalia without lesions. BUS and vagina normal; no cervical motion tenderness. No abnormal vaginal discharge. Uterus normal size, nontender, no masses.  No adnexal masses or tenderness. Pap not performed   Rectal:  Normal tone, no masses or tenderness; guaiac negative stool   Extremities:  No clubbing, cyanosis or edema. Deformity to distal phalanx of L finger, arthritic changes   Pulses:  2+ and symmetric all extremities   Skin:  Skin color, texture, turgor normal, no rashes or lesions   Lymph nodes:  Cervical, supraclavicular, and axillary nodes normal    Neurologic:  CNII-XII intact, normal strength, sensation and gait; reflexes 2+ and symmetric throughout   Psych: Normal mood, affect, hygiene and grooming.   Lab Results  Component Value Date   CHOL 161 04/15/2012   HDL 42 04/15/2012   LDLCALC 90 04/15/2012   TRIG 143 04/15/2012   CHOLHDL 3.8 04/15/2012     Chemistry      Component Value Date/Time   NA 140 04/15/2012 0906   K 3.9 04/15/2012 0906   CL 100 04/15/2012 0906   CO2 30 04/15/2012 0906   BUN 33* 04/15/2012 0906   CREATININE 1.24* 04/15/2012 0906   CREATININE 1.43* 11/12/2011 1405      Component Value Date/Time   CALCIUM 9.5 04/15/2012 0906   ALKPHOS 65 04/15/2012 0906   AST 21 04/15/2012 0906   ALT 17 04/15/2012 0906   BILITOT 0.5 04/15/2012 0906     Glucose 93  Lab Results  Component Value Date   TSH 4.217 04/15/2012   ASSESSMENT/PLAN:  1. Routine general medical examination at a health care facility  Visual acuity screening, POCT Urinalysis Dipstick  2. Pure hypercholesterolemia  pravastatin (PRAVACHOL) 20 MG tablet   controlled  3. Essential hypertension, benign  benazepril-hydrochlorthiazide (LOTENSIN HCT) 20-12.5 MG per tablet   elevated today; some component of white coat HTN.  needs to check more regularly elsewhere to see if at goal  4. OA (osteoarthritis)  celecoxib (CELEBREX) 200 MG capsule   well controlled.  reviewed risks/side effects of Celebrex.  Creatinine improved.  okay to continue  5. Urge incontinence of urine  solifenacin (VESICARE) 5 MG tablet   suboptimally controlled on 5mg  Vesicare   Urge urinary incontinence: Trial of increase vesicare upon her return from trip (don't take higher dose while using bonine). Discussed trying 1.5 tablets, and if ineffective but no side effects, increase to 2 tablets, and call for dose change to 10mg  tablet for new rx  HTN--elevated today, some white coat component.  Need to check regularly at home, and show [place] list to determine if meds need to be  adjusted.  Discussed monthly self breast exams and yearly mammograms; at least 30 minutes of aerobic activity at least 5 days/week; proper sunscreen use reviewed; healthy diet, including goals of calcium and vitamin D intake and alcohol recommendations (less than or equal to 1 drink/day) reviewed; regular seatbelt use; changing batteries in smoke detectors. Immunization recommendations discussed--UTD. Colonoscopy recommendations  reviewed--UTD   F/u 6 months, sooner prn (she will be calling for med refills sooner, once she finds out details about which pharmacy she needs to switch to next year, per her insurance)

## 2012-04-25 NOTE — Patient Instructions (Addendum)
HEALTH MAINTENANCE RECOMMENDATIONS:  It is recommended that you get at least 30 minutes of aerobic exercise at least 5 days/week (for weight loss, you may need as much as 60-90 minutes). This can be any activity that gets your heart rate up. This can be divided in 10-15 minute intervals if needed, but try and build up your endurance at least once a week.  Weight bearing exercise is also recommended twice weekly.  Eat a healthy diet with lots of vegetables, fruits and fiber.  "Colorful" foods have a lot of vitamins (ie green vegetables, tomatoes, red peppers, etc).  Limit sweet tea, regular sodas and alcoholic beverages, all of which has a lot of calories and sugar.  Up to 1 alcoholic drink daily may be beneficial for women (unless trying to lose weight, watch sugars).  Drink a lot of water.  Calcium recommendations are 1200-1500 mg daily (1500 mg for postmenopausal women or women without ovaries), and vitamin D 1000 IU daily.  This should be obtained from diet and/or supplements (vitamins), and calcium should not be taken all at once, but in divided doses.  Monthly self breast exams and yearly mammograms for women over the age of 41 is recommended.  Sunscreen of at least SPF 30 should be used on all sun-exposed parts of the skin when outside between the hours of 10 am and 4 pm (not just when at beach or pool, but even with exercise, golf, tennis, and yard work!)  Use a sunscreen that says "broad spectrum" so it covers both UVA and UVB rays, and make sure to reapply every 1-2 hours.  Remember to change the batteries in your smoke detectors when changing your clock times in the spring and fall.  Use your seat belt every time you are in a car, and please drive safely and not be distracted with cell phones and texting while driving.   You can try taking higher dose of Vesicare.  It comes at 10mg , but if you are worried about side effects of doubling dose, you can try taking 1.5 tablets daily. If this  isn't effective, but side effects are tolerable, then increase to 10mg  (2 5 mg tablets), and call us for new rx for 10mg  if this is effective, and tolerating without side effects.  If you don't tolerate higher dose, then call to try a different medication, but I can't guarantee it will be any more effective.   Lab Results  Component Value Date   CHOL 161 04/15/2012   HDL 42 04/15/2012   LDLCALC 90 04/15/2012   TRIG 143 04/15/2012   CHOLHDL 3.8 04/15/2012     Chemistry      Component Value Date/Time   NA 140 04/15/2012 0906   K 3.9 04/15/2012 0906   CL 100 04/15/2012 0906   CO2 30 04/15/2012 0906   BUN 33* 04/15/2012 0906   CREATININE 1.24* 04/15/2012 0906   CREATININE 1.43* 11/12/2011 1405      Component Value Date/Time   CALCIUM 9.5 04/15/2012 0906   ALKPHOS 65 04/15/2012 0906   AST 21 04/15/2012 0906   ALT 17 04/15/2012 0906   BILITOT 0.5 04/15/2012 0906     Glucose 93  Lab Results  Component Value Date   TSH 4.217 04/15/2012    Please check your blood pressures regularly, write down, and let me review the list, to determine if your blood pressure is adequately controlled.  It was high at visit today.

## 2012-06-27 ENCOUNTER — Telehealth: Payer: Self-pay | Admitting: Family Medicine

## 2012-06-27 DIAGNOSIS — I10 Essential (primary) hypertension: Secondary | ICD-10-CM

## 2012-06-27 DIAGNOSIS — E7800 Pure hypercholesterolemia, unspecified: Secondary | ICD-10-CM

## 2012-06-27 DIAGNOSIS — N3941 Urge incontinence: Secondary | ICD-10-CM

## 2012-06-27 DIAGNOSIS — M1990 Unspecified osteoarthritis, unspecified site: Secondary | ICD-10-CM

## 2012-06-27 MED ORDER — BENAZEPRIL-HYDROCHLOROTHIAZIDE 20-12.5 MG PO TABS: 2.0000 | ORAL_TABLET | Freq: Every day | ORAL | Status: DC

## 2012-06-27 MED ORDER — SOLIFENACIN SUCCINATE 5 MG PO TABS: 5.0000 mg | ORAL_TABLET | Freq: Every day | ORAL | Status: DC

## 2012-06-27 MED ORDER — CELECOXIB 200 MG PO CAPS: 200.0000 mg | ORAL_CAPSULE | Freq: Every day | ORAL | Status: DC

## 2012-06-27 MED ORDER — PRAVASTATIN SODIUM 20 MG PO TABS: 20.0000 mg | ORAL_TABLET | Freq: Every day | ORAL | Status: DC

## 2012-06-27 NOTE — Telephone Encounter (Signed)
Done

## 2012-07-23 ENCOUNTER — Telehealth: Payer: Self-pay | Admitting: Oncology

## 2012-07-23 NOTE — Telephone Encounter (Signed)
S/W PT HUSBAND IN RE TO PROVIDER CHANGE AND APPT 7/23 @ 3:30.  LETTER/CALENDAR MAILED.

## 2012-07-25 ENCOUNTER — Encounter: Payer: Self-pay | Admitting: Oncology

## 2012-08-08 ENCOUNTER — Telehealth: Payer: Self-pay | Admitting: Oncology

## 2012-09-12 ENCOUNTER — Encounter: Payer: Self-pay | Admitting: Family Medicine

## 2012-09-12 ENCOUNTER — Ambulatory Visit (INDEPENDENT_AMBULATORY_CARE_PROVIDER_SITE_OTHER): Payer: Medicare Other | Admitting: Family Medicine

## 2012-09-12 VITALS — BP 128/78 | HR 92 | Temp 98.2°F | Ht 61.0 in | Wt 155.0 lb

## 2012-09-12 DIAGNOSIS — K5289 Other specified noninfective gastroenteritis and colitis: Secondary | ICD-10-CM

## 2012-09-12 DIAGNOSIS — K529 Noninfective gastroenteritis and colitis, unspecified: Secondary | ICD-10-CM

## 2012-09-12 NOTE — Patient Instructions (Addendum)
Avoid dairy for at least a week. Take Align (probiotic) to help get the normal bowel flora back in check Imodium or pepto bismol   Return if having fevers, increasing abdominal pain, persistent diarrhea, dizziness, vomiting, blood in the stool Drink plenty of fluids Start with clear liquids, advance as tolerated to bananas, rice, apple sauce or toast.  Eventually advance diet further, but stay away from fried/greasy/spicy foods and dairy for longer.

## 2012-09-12 NOTE — Progress Notes (Signed)
Chief Complaint  Patient presents with  . Acute Visit    upset stomach x 3 days. Started with diarrhea Fri evening, went to [employer] for grand daughters graduation. No vomiting. Does feel kind of nauseous. Does not really want to eat, is weak though. For the last two days she has only taken baby aspirin and pravastatin.    Patient presents today with complaint of diarrhea x 3 days.  She is accompanied by her husband.  Had diarrhea almost every hour last night, 3 episodes during the day today.  Symptoms started 3 nights ago, and seem to be worse (more frequent) at night.  Took Pepto Bismol during the day, which seems to temporarily help--took them in the morning to get through the graduation and the drive home.  Stools are brown, water, completely unformed.  Noted blood just once, from hemorrhoid after wiping.  Having some cramping, no severe abdominal pain, sore if the cat walks across her stomach.  Denies fevers.  Mild nausea, no vomiting.  Decreased appetite  No recent antibiotics, camping, drinking stream water or travel outside of the country.  She worked at [employer] on Tuesday.  Hasn't eaten any undercooked foods, spoiled foods, and husband has eaten the same foods and hasn't been sick.  Past Medical History  Diagnosis Date  . Hypertension   . Hyperlipidemia   . Arthritis     OA spine, hands  . DDD (degenerative disc disease), lumbar   . Adenomatous colon polyp 1/03  . Abnormal brain MRI 8/01    small hemorrhagic stroke and possible cavernous hemangioma (Dr. Sandria Manly)  . Osteopenia     (DEXA's done by Dr. Donnie Coffin); prev took Fosamax.  No change in DEXA after off Fosamax x 2 years  . Postmenopausal bleeding 2000    benign EMB (Dr. Stefano Gaul)  . GERD (gastroesophageal reflux disease)   . Herpes zoster 2003  . Onychomycosis 2003, 2008    treated with Lamisil  . Breast cancer 2001    R breast (T3N1, ER/PR+, HER-2 +) s/p mastectomy, chemo and chest wall irradiation (Dr. Donnie Coffin)  .  Urge urinary incontinence   . Ovarian cyst 12/2008    right   Past Surgical History  Procedure Laterality Date  . Tubal ligation    . Tonsillectomy    . Modified mastectomy and tram flap reconstruction  2001    R breast  . Tram flap removal      problems with flap after radiation  . Cholecystectomy, laparoscopic  1/05  . Colonoscopy w/ polypectomy  05/23/01    adenomatous polyp  . Colonoscopy  12/17/08    normal (Dr. Laural Benes)  . Cataract extraction  summer 2011    bilateral  . Hearing loss      hearing aids 2012   History   Social History  . Marital Status: Married    Spouse Name: N/A    Number of Children: 3  . Years of Education: N/A   Occupational History  . Not on file.   Social History Main Topics  . Smoking status: Never Smoker   . Smokeless tobacco: Never Used  . Alcohol Use: No  . Drug Use: No  . Sexually Active: Not Currently     Comment: due to poor libido   Other Topics Concern  . Not on file   Social History Narrative   Lives with husband, cat.  1 daughter at [employer]; daughter in [place], son in [place]    Current outpatient prescriptions:aspirin  81 MG tablet, Take 81 mg by mouth daily.  , Disp: , Rfl: ;  calcium carbonate (TUMS - DOSED IN MG ELEMENTAL CALCIUM) 500 MG chewable tablet, Chew 1 tablet by mouth as needed.  , Disp: , Rfl: ;  pravastatin (PRAVACHOL) 20 MG tablet, Take 1 tablet (20 mg total) by mouth daily., Disp: 90 tablet, Rfl: 0 benazepril-hydrochlorthiazide (LOTENSIN HCT) 20-12.5 MG per tablet, Take 2 tablets by mouth daily., Disp: 180 tablet, Rfl: 0;  Calcium Carbonate-Vitamin D (CALTRATE 600+D) 600-400 MG-UNIT per tablet, Take 2 tablets by mouth daily.  , Disp: , Rfl: ;  celecoxib (CELEBREX) 200 MG capsule, Take 1 capsule (200 mg total) by mouth daily., Disp: 90 capsule, Rfl: 0 Multiple Vitamins-Minerals (CENTRUM SILVER PO), Take 1 tablet by mouth daily.  , Disp: , Rfl: ;  omeprazole (PRILOSEC) 20 MG capsule, Take 20 mg by mouth as  needed.  , Disp: , Rfl: ;  solifenacin (VESICARE) 5 MG tablet, Take 1 tablet (5 mg total) by mouth daily., Disp: 90 tablet, Rfl: 0;  [DISCONTINUED] solifenacin (VESICARE) 5 MG tablet, Take 1 tablet (5 mg total) by mouth daily., Disp: 90 tablet, Rfl: 0  Allergies  Allergen Reactions  . Cephalexin Itching  . Ciprofloxacin Itching  . Diclofenac Nausea Only    Upset stomach  . Shellfish-Derived Products Nausea And Vomiting   ROS:  Denies fevers, dizziness, headaches, chest pain, shortness of breath, URI symptoms, cough.  Denies skin rash, new joint pains, swelling, or other concerns.  See HPI for GI complaints.  PHYSICAL EXAM: BP 128/78  Pulse 92  Temp(Src) 98.2 F (36.8 C) (Oral)  Ht 5\' 1"  (1.549 m)  Wt 155 lb (70.308 kg)  BMI 29.3 kg/m2 Well developed, pleasant female, joking, in good spirits, in no distress HEENT:  Conjunctiva clear.  Mucus membranes moist Neck: no lymphadenopathy or mass Heart: regular rate and rhythm without murmur Lungs: clear bilaterally Abdomen:  Normal bowel sounds. Soft.  diffusely tender throughout abdomen; no rebound tenderness or guarding, no masses. Skin: no rash Psych: normal mood, affect  ASSESSMENT/PLAN: AGE (acute gastroenteritis)  Suspect viral, given possible sick contacts when working at [employer], and no other known etiology.  Without recent ABX use, okay to use anti-diarrheal meds (imodium, pepto bismol) prn. Supportive measures reviewed. Probiotics recommended, as well as lactose-free diet.  Return for increasing pain, fevers, blood in stool, worsening abdominal pain

## 2012-10-04 ENCOUNTER — Telehealth: Payer: Self-pay | Admitting: Oncology

## 2012-10-04 NOTE — Telephone Encounter (Signed)
, °

## 2012-10-17 ENCOUNTER — Encounter: Payer: Self-pay | Admitting: Family Medicine

## 2012-10-17 ENCOUNTER — Ambulatory Visit (INDEPENDENT_AMBULATORY_CARE_PROVIDER_SITE_OTHER): Payer: Medicare Other | Admitting: Family Medicine

## 2012-10-17 VITALS — BP 142/64 | HR 80 | Ht 60.0 in | Wt 158.0 lb

## 2012-10-17 DIAGNOSIS — Z79899 Other long term (current) drug therapy: Secondary | ICD-10-CM

## 2012-10-17 DIAGNOSIS — M1990 Unspecified osteoarthritis, unspecified site: Secondary | ICD-10-CM

## 2012-10-17 DIAGNOSIS — E7800 Pure hypercholesterolemia, unspecified: Secondary | ICD-10-CM

## 2012-10-17 DIAGNOSIS — I10 Essential (primary) hypertension: Secondary | ICD-10-CM

## 2012-10-17 DIAGNOSIS — N3941 Urge incontinence: Secondary | ICD-10-CM

## 2012-10-17 LAB — COMPREHENSIVE METABOLIC PANEL
ALT: 16 U/L (ref 0–35)
AST: 19 U/L (ref 0–37)
Albumin: 4 g/dL (ref 3.5–5.2)
Alkaline Phosphatase: 68 U/L (ref 39–117)
BUN: 40 mg/dL — ABNORMAL HIGH (ref 6–23)
CO2: 28 mEq/L (ref 19–32)
Calcium: 9.4 mg/dL (ref 8.4–10.5)
Chloride: 102 mEq/L (ref 96–112)
Creat: 1.64 mg/dL — ABNORMAL HIGH (ref 0.50–1.10)
Glucose, Bld: 86 mg/dL (ref 70–99)
Potassium: 4.1 mEq/L (ref 3.5–5.3)
Sodium: 143 mEq/L (ref 135–145)
Total Bilirubin: 0.3 mg/dL (ref 0.3–1.2)
Total Protein: 6 g/dL (ref 6.0–8.3)

## 2012-10-17 MED ORDER — LISINOPRIL-HYDROCHLOROTHIAZIDE 20-12.5 MG PO TABS: 2.0000 | ORAL_TABLET | Freq: Every day | ORAL | Status: DC

## 2012-10-17 MED ORDER — PRAVASTATIN SODIUM 20 MG PO TABS: 20.0000 mg | ORAL_TABLET | Freq: Every day | ORAL | Status: DC

## 2012-10-17 MED ORDER — SOLIFENACIN SUCCINATE 5 MG PO TABS: 5.0000 mg | ORAL_TABLET | Freq: Every day | ORAL | Status: DC

## 2012-10-17 MED ORDER — CELECOXIB 200 MG PO CAPS: 200.0000 mg | ORAL_CAPSULE | Freq: Every day | ORAL | Status: DC

## 2012-10-17 NOTE — Progress Notes (Signed)
Chief Complaint  Patient presents with  . Hypertension    6 month med check.   Arthritis--doing well with Celebrex.  Pain is in back, feet and hands, and medication is effective at reducing pain.  Previously tried tylenol without adequate pain relief. Denies any GI side effects, takes with her breakfast.  HTN--her husband's lisinopril and lasix are much cheaper than her benazepril HCT; wondering about changing meds.  BP's at home run 134-169/68-80.  Denies headaches, dizziness, swelling in feet, chest pain, palpitations, shortness of breath.  Has some foot cramping. Eating 1/2 banana daily.  Urge urinary incontinence--improved with Vesicare initially, but still having a lot of urgency, no dysuria. Urgency is extreme, and sometimes has incontinence. Never tried increasing the dose as discussed at her last visit in December.  Hyperlipidemia--on low cholesterol diet, compliant with pravastatin, denies side effects, myalgias.  Past Medical History  Diagnosis Date  . Hypertension   . Hyperlipidemia   . Arthritis     OA spine, hands  . DDD (degenerative disc disease), lumbar   . Adenomatous colon polyp 1/03  . Abnormal brain MRI 8/01    small hemorrhagic stroke and possible cavernous hemangioma (Dr. Sandria Manly)  . Osteopenia     (DEXA's done by Dr. Donnie Coffin); prev took Fosamax.  No change in DEXA after off Fosamax x 2 years  . Postmenopausal bleeding 2000    benign EMB (Dr. Stefano Gaul)  . GERD (gastroesophageal reflux disease)   . Herpes zoster 2003  . Onychomycosis 2003, 2008    treated with Lamisil  . Breast cancer 2001    R breast (T3N1, ER/PR+, HER-2 +) s/p mastectomy, chemo and chest wall irradiation (Dr. Donnie Coffin)  . Urge urinary incontinence   . Ovarian cyst 12/2008    right   Past Surgical History  Procedure Laterality Date  . Tubal ligation    . Tonsillectomy    . Modified mastectomy and tram flap reconstruction  2001    R breast  . Tram flap removal      problems with flap after  radiation  . Cholecystectomy, laparoscopic  1/05  . Colonoscopy w/ polypectomy  05/23/01    adenomatous polyp  . Colonoscopy  12/17/08    normal (Dr. Laural Benes)  . Cataract extraction  summer 2011    bilateral  . Hearing loss      hearing aids 2012   History   Social History  . Marital Status: Married    Spouse Name: N/A    Number of Children: 3  . Years of Education: N/A   Occupational History  . Not on file.   Social History Main Topics  . Smoking status: Never Smoker   . Smokeless tobacco: Never Used  . Alcohol Use: No  . Drug Use: No  . Sexually Active: Not Currently     Comment: due to poor libido   Other Topics Concern  . Not on file   Social History Narrative   Lives with husband, cat.  1 daughter at [employer] [place]--hospitalist; daughter in [place], son in [place]    Current outpatient prescriptions:aspirin 81 MG tablet, Take 81 mg by mouth daily.  , Disp: , Rfl: ;  benazepril-hydrochlorthiazide (LOTENSIN HCT) 20-12.5 MG per tablet, Take 2 tablets by mouth daily., Disp: 180 tablet, Rfl: 0;  calcium carbonate (TUMS - DOSED IN MG ELEMENTAL CALCIUM) 500 MG chewable tablet, Chew 1 tablet by mouth as needed.  , Disp: , Rfl:  Calcium Carbonate-Vitamin D (CALTRATE 600+D) 600-400 MG-UNIT per tablet, Take  2 tablets by mouth daily.  , Disp: , Rfl: ;  celecoxib (CELEBREX) 200 MG capsule, Take 1 capsule (200 mg total) by mouth daily., Disp: 90 capsule, Rfl: 0;  Multiple Vitamins-Minerals (CENTRUM SILVER PO), Take 1 tablet by mouth daily.  , Disp: , Rfl: ;  omeprazole (PRILOSEC) 20 MG capsule, Take 20 mg by mouth as needed.  , Disp: , Rfl:  pravastatin (PRAVACHOL) 20 MG tablet, Take 1 tablet (20 mg total) by mouth daily., Disp: 90 tablet, Rfl: 0;  solifenacin (VESICARE) 5 MG tablet, Take 1 tablet (5 mg total) by mouth daily., Disp: 90 tablet, Rfl: 0;  [DISCONTINUED] solifenacin (VESICARE) 5 MG tablet, Take 1 tablet (5 mg total) by mouth daily., Disp: 90 tablet, Rfl: 0  Allergies  Allergen  Reactions  . Cephalexin Itching  . Ciprofloxacin Itching  . Diclofenac Nausea Only    Upset stomach  . Shellfish-Derived Products Nausea And Vomiting   ROS:  Denies fevers, headaches, dizzines, URI symptoms, cough, shortness of breath, nausea, vomiting, diarrhea (finally all resolved).  Some cramping in feet.   No bleeding, bruising, rashes.  Dry mouth in the mornings, but otherwise okay during the day.  Occasionally has hemorrhoids that bleed.  PHYSICAL EXAM: BP 128/70  Pulse 80  Ht 5' (1.524 m)  Wt 158 lb (71.668 kg)  BMI 30.86 kg/m2 142/64 LA by MD Well developed, pleasant female in no distress Neck: no lymphadenopathy, thyromegaly or mass Heart: regular rate and rhythm without murmur Lungs: clear bilaterally Abdomen: soft, nontender no organomegaly or mass Extremities: no edema Skin: no rash, bruising Psych: normal mood, affect, hygiene and grooming  ASSESSMENT/PLAN: Essential hypertension, benign - Plan: lisinopril-hydrochlorothiazide (PRINZIDE,ZESTORETIC) 20-12.5 MG per tablet, Comprehensive metabolic panel  OA (osteoarthritis) - well controlled.  reviewed risks/side effects of Celebrex.  Creatinine improved.  okay to continue - Plan: celecoxib (CELEBREX) 200 MG capsule  Pure hypercholesterolemia - controlled - Plan: pravastatin (PRAVACHOL) 20 MG tablet  Urge incontinence of urine - suboptimally controlled on 5mg  Vesicare - Plan: solifenacin (VESICARE) 5 MG tablet  Encounter for long-term (current) use of other medications - Plan: Comprehensive metabolic panel  HTN--well controlled per numbers here; slightly higher at home.  Will bring BP monitor to next visit or sooner for a NV if she prefers, in order to verify the accuracy of the machine.   Change from benazepril HCT to lisinopril HCT for cost purposes.  Continue to monitor BPs at home, and f/u in 6-8 weeks (has 2 weeks of current meds at home).  Arthritis--doing well on Celebrex.  Due for labs.  Hyperlipidemia--last  lipid in 04/2012 was at goal.  Check liver tests today.  Urge urinary incontinence: Trial of increase vesicare--1.5 tablets, and if ineffective but no side effects, increase to 2 tablets, and call for dose change to 10mg  tablet for new rx.  Risks/side effects reviewed.  F/u 6-8 weeks, sooner prn.  Bring list of BPs from home, and monitor to verify the accuracy.

## 2012-10-17 NOTE — Patient Instructions (Signed)
When you get the lisinopril HCT, you will take it exactly the same as you are currently taking the benazepril HCT (2 tablets daily).  Continue potassium in your diet.  Continue to check BP's regularly.  Bring your monitor to your next visit for it to be checked.  Return in 6-8 weeks for a BP check with your monitor.  Urinary leakage:  Trial of increase vesicare to 1.5 tablets, and if ineffective but no side effects, increase to 2 tablets.  Call for dose change to 10mg  tablet for new prescription if the 2 tablets are effective and not causing side effects (mainly dry mouth and worsening constipation).

## 2012-10-20 ENCOUNTER — Other Ambulatory Visit: Payer: Self-pay | Admitting: *Deleted

## 2012-10-20 DIAGNOSIS — I10 Essential (primary) hypertension: Secondary | ICD-10-CM

## 2012-10-24 ENCOUNTER — Telehealth: Payer: Self-pay | Admitting: Family Medicine

## 2012-10-24 ENCOUNTER — Encounter: Payer: Medicare Other | Admitting: Family Medicine

## 2012-10-24 NOTE — Telephone Encounter (Signed)
P.A. APPROVED FOR VESICARE FOR UP TO #270 PER 90 DAYS.  PT INFORMED. SHE IS CALLING [facility]

## 2012-10-31 ENCOUNTER — Encounter: Payer: Medicare Other | Admitting: Family Medicine

## 2012-11-03 ENCOUNTER — Other Ambulatory Visit: Payer: Medicare Other

## 2012-11-03 DIAGNOSIS — I10 Essential (primary) hypertension: Secondary | ICD-10-CM

## 2012-11-04 LAB — BASIC METABOLIC PANEL
BUN: 25 mg/dL — ABNORMAL HIGH (ref 6–23)
CO2: 29 mEq/L (ref 19–32)
Calcium: 9.8 mg/dL (ref 8.4–10.5)
Chloride: 103 mEq/L (ref 96–112)
Creat: 1.39 mg/dL — ABNORMAL HIGH (ref 0.50–1.10)
Glucose, Bld: 115 mg/dL — ABNORMAL HIGH (ref 70–99)
Potassium: 4.2 mEq/L (ref 3.5–5.3)
Sodium: 139 mEq/L (ref 135–145)

## 2012-11-10 ENCOUNTER — Other Ambulatory Visit: Payer: Self-pay | Admitting: Medical Oncology

## 2012-11-10 DIAGNOSIS — M8580 Other specified disorders of bone density and structure, unspecified site: Secondary | ICD-10-CM

## 2012-11-10 DIAGNOSIS — M1990 Unspecified osteoarthritis, unspecified site: Secondary | ICD-10-CM

## 2012-11-11 ENCOUNTER — Other Ambulatory Visit (HOSPITAL_BASED_OUTPATIENT_CLINIC_OR_DEPARTMENT_OTHER): Payer: Medicare Other

## 2012-11-11 DIAGNOSIS — M1990 Unspecified osteoarthritis, unspecified site: Secondary | ICD-10-CM

## 2012-11-11 DIAGNOSIS — E559 Vitamin D deficiency, unspecified: Secondary | ICD-10-CM

## 2012-11-11 DIAGNOSIS — M8580 Other specified disorders of bone density and structure, unspecified site: Secondary | ICD-10-CM

## 2012-11-11 LAB — CBC WITH DIFFERENTIAL/PLATELET
BASO%: 0.9 % (ref 0.0–2.0)
Basophils Absolute: 0.1 10*3/uL (ref 0.0–0.1)
EOS%: 1.7 % (ref 0.0–7.0)
Eosinophils Absolute: 0.1 10*3/uL (ref 0.0–0.5)
HCT: 39.6 % (ref 34.8–46.6)
HGB: 13.6 g/dL (ref 11.6–15.9)
LYMPH%: 21.4 % (ref 14.0–49.7)
MCH: 29.9 pg (ref 25.1–34.0)
MCHC: 34.2 g/dL (ref 31.5–36.0)
MCV: 87.3 fL (ref 79.5–101.0)
MONO#: 0.4 10*3/uL (ref 0.1–0.9)
MONO%: 5.8 % (ref 0.0–14.0)
NEUT#: 4.4 10*3/uL (ref 1.5–6.5)
NEUT%: 70.2 % (ref 38.4–76.8)
Platelets: 167 10*3/uL (ref 145–400)
RBC: 4.53 10*6/uL (ref 3.70–5.45)
RDW: 14.6 % — ABNORMAL HIGH (ref 11.2–14.5)
WBC: 6.3 10*3/uL (ref 3.9–10.3)
lymph#: 1.3 10*3/uL (ref 0.9–3.3)

## 2012-11-11 LAB — COMPREHENSIVE METABOLIC PANEL (CC13)
ALT: 17 U/L (ref 0–55)
AST: 19 U/L (ref 5–34)
Albumin: 3.5 g/dL (ref 3.5–5.0)
Alkaline Phosphatase: 78 U/L (ref 40–150)
BUN: 31.6 mg/dL — ABNORMAL HIGH (ref 7.0–26.0)
CO2: 26 mEq/L (ref 22–29)
Calcium: 10.1 mg/dL (ref 8.4–10.4)
Chloride: 104 mEq/L (ref 98–109)
Creatinine: 1.5 mg/dL — ABNORMAL HIGH (ref 0.6–1.1)
Glucose: 112 mg/dl (ref 70–140)
Potassium: 4.1 mEq/L (ref 3.5–5.1)
Sodium: 141 mEq/L (ref 136–145)
Total Bilirubin: 0.43 mg/dL (ref 0.20–1.20)
Total Protein: 6.7 g/dL (ref 6.4–8.3)

## 2012-11-12 LAB — VITAMIN D 25 HYDROXY (VIT D DEFICIENCY, FRACTURES): Vit D, 25-Hydroxy: 81 ng/mL (ref 30–89)

## 2012-11-15 ENCOUNTER — Other Ambulatory Visit: Payer: Medicare Other | Admitting: Lab

## 2012-11-22 ENCOUNTER — Ambulatory Visit: Payer: Medicare Other | Admitting: Oncology

## 2012-11-23 ENCOUNTER — Ambulatory Visit: Payer: Medicare Other | Admitting: Oncology

## 2012-11-25 ENCOUNTER — Ambulatory Visit (HOSPITAL_BASED_OUTPATIENT_CLINIC_OR_DEPARTMENT_OTHER): Payer: Medicare Other | Admitting: Adult Health

## 2012-11-25 ENCOUNTER — Encounter: Payer: Self-pay | Admitting: Adult Health

## 2012-11-25 VITALS — BP 146/78 | HR 86 | Temp 98.6°F | Resp 20 | Ht 60.0 in | Wt 159.0 lb

## 2012-11-25 DIAGNOSIS — C50911 Malignant neoplasm of unspecified site of right female breast: Secondary | ICD-10-CM | POA: Insufficient documentation

## 2012-11-25 DIAGNOSIS — Z9010 Acquired absence of unspecified breast and nipple: Secondary | ICD-10-CM

## 2012-11-25 DIAGNOSIS — Z853 Personal history of malignant neoplasm of breast: Secondary | ICD-10-CM

## 2012-11-25 DIAGNOSIS — M899 Disorder of bone, unspecified: Secondary | ICD-10-CM

## 2012-11-25 DIAGNOSIS — M949 Disorder of cartilage, unspecified: Secondary | ICD-10-CM

## 2012-11-25 NOTE — Progress Notes (Addendum)
OFFICE PROGRESS NOTE  CC**  [name],Connie A, MD [address] [place] [address]  DIAGNOSIS:  79 y/o female with h/o stage IIIC ER positive, PR positive, HER-2/neu positive invasive ductal carcinoma of the right breast in 2001.    PRIOR THERAPY:   1. Patient found a lump in her breast in March, 2001 and went to her PCP who sent her for a mammogram and biopsy.  She underwent the biopsy on 07/16/1999 that confirmed the invasive ductal carcinoma.    2. Patient underwent right mastectomy by Dr. Maple Hudson.  A 5.2 cm tumor and a 2.7 cm, path revealed invasive ductal carcinoma, grade 2, ER 95%, PR 19%, HER-2 positive with a Ki-67 5%. ALND revealed that 20 out of 20 lymph nodes were positive for disease.  A bone scan in March 2001 was negative for disease.    3. She was then put on [facility] x 6 cycles in April 2001, this was followed by adjuvant Herceptin to complete a year of treatment.    4.  She underwent radiation therapy from October 2001 through December 2001.  5. She was started on Arimidex in February 2002 and completed 10 years of anti-estrogen therapy.  She does have a h/o osteopnia and is on Calcium, Vitamin D, and previously Fosamax.    CURRENT THERAPY: Observation  INTERVAL HISTORY: Connie [name] 79 y.o. female returns for follow up and evaluation of her h/o stage IIIB invasive ductal carcinoma of the right breast.  She is doing well and without any questions or concerns.  She is a long time survivor. She denies fevers, chills, night sweats, pain, swelling, or any other concerns.  She has a h/o lymphedema and wears a sleeve most of the time. Otherwise a 10 point ROS is negative.   MEDICAL HISTORY: Past Medical History  Diagnosis Date  . Hypertension   . Hyperlipidemia   . Arthritis     OA spine, hands  . DDD (degenerative disc disease), lumbar   . Adenomatous colon polyp 1/03  . Abnormal brain MRI 8/01    small hemorrhagic stroke and possible cavernous hemangioma (Dr.  Sandria Manly)  . Osteopenia     (DEXA's done by Dr. Donnie Coffin); prev took Fosamax.  No change in DEXA after off Fosamax x 2 years  . Postmenopausal bleeding 2000    benign EMB (Dr. Stefano Gaul)  . GERD (gastroesophageal reflux disease)   . Herpes zoster 2003  . Onychomycosis 2003, 2008    treated with Lamisil  . Breast cancer 2001    R breast (T3N1, ER/PR+, HER-2 +) s/p mastectomy, chemo and chest wall irradiation (Dr. Donnie Coffin)  . Urge urinary incontinence   . Ovarian cyst 12/2008    right    ALLERGIES:  is allergic to cephalexin; ciprofloxacin; diclofenac; and shellfish-derived products.  MEDICATIONS:  Current Outpatient Prescriptions  Medication Sig Dispense Refill  . aspirin 81 MG tablet Take 81 mg by mouth daily.        . calcium carbonate (TUMS - DOSED IN MG ELEMENTAL CALCIUM) 500 MG chewable tablet Chew 1 tablet by mouth as needed.        . Calcium Carbonate-Vitamin D (CALTRATE 600+D) 600-400 MG-UNIT per tablet Take 2 tablets by mouth daily.        . celecoxib (CELEBREX) 200 MG capsule Take 1 capsule (200 mg total) by mouth daily.  90 capsule  1  . lisinopril-hydrochlorothiazide (PRINZIDE,ZESTORETIC) 20-12.5 MG per tablet Take 2 tablets by mouth daily.  180 tablet  1  .  Multiple Vitamins-Minerals (CENTRUM SILVER PO) Take 1 tablet by mouth daily.        Marland Kitchen omeprazole (PRILOSEC) 20 MG capsule Take 20 mg by mouth as needed.        . pravastatin (PRAVACHOL) 20 MG tablet Take 1 tablet (20 mg total) by mouth daily.  90 tablet  1  . Probiotic Product (ALIGN PO) Take 1 tablet by mouth daily.      . solifenacin (VESICARE) 5 MG tablet Take 1-2 tablets (5-10 mg total) by mouth daily.  145 tablet  1  . [DISCONTINUED] solifenacin (VESICARE) 5 MG tablet Take 1 tablet (5 mg total) by mouth daily.  90 tablet  0   No current facility-administered medications for this visit.    SURGICAL HISTORY:  Past Surgical History  Procedure Laterality Date  . Tubal ligation    . Tonsillectomy    . Modified mastectomy  and tram flap reconstruction  2001    R breast  . Tram flap removal      problems with flap after radiation  . Cholecystectomy, laparoscopic  1/05  . Colonoscopy w/ polypectomy  05/23/01    adenomatous polyp  . Colonoscopy  12/17/08    normal (Dr. Laural Benes)  . Cataract extraction  summer 2011    bilateral  . Hearing loss      hearing aids 2012    REVIEW OF SYSTEMS:  General: fatigue (-), night sweats (-), fever (-), pain (-) Lymph: palpable nodes (-) HEENT: vision changes (-), mucositis (-), gum bleeding (-), epistaxis (-) Cardiovascular: chest pain (-), palpitations (-) Pulmonary: shortness of breath (-), dyspnea on exertion (-), cough (-), hemoptysis (-) GI:  Early satiety (-), melena (-), dysphagia (-), nausea/vomiting (-), diarrhea (-) GU: dysuria (-), hematuria (-), incontinence (-) Musculoskeletal: joint swelling (-), joint pain (-), back pain (-) Neuro: weakness (-), numbness (-), headache (-), confusion (-) Skin: Rash (-), lesions (-), dryness (-) Psych: depression (-), suicidal/homicidal ideation (-), feeling of hopelessness (-)    HEALTH MAINTENANCE:  Mammogram 12/23/2011 Colonoscopy 12/17/2008 Bone Density 12/23/2011 Eye Exam 2 years ago Vitamin D 11/11/2012  PHYSICAL EXAMINATION: Blood pressure 146/78, pulse 86, temperature 98.6 F (37 C), temperature source Oral, resp. rate 20, height 5' (1.524 m), weight 159 lb (72.122 kg). Body mass index is 31.05 kg/(m^2). General: Patient is a well appearing female in no acute distress HEENT: PERRLA, sclerae anicteric no conjunctival pallor, MMM Neck: supple, no palpable adenopathy Lungs: clear to auscultation bilaterally, no wheezes, rhonchi, or rales Cardiovascular: regular rate rhythm, S1, S2, no murmurs, rubs or gallops Abdomen: Soft, non-tender, non-distended, normoactive bowel sounds, no HSM Extremities: warm and well perfused, no clubbing, cyanosis, or edema Skin: No rashes or lesions Neuro: Non-focal Breasts: Patient  right mastectomy site with severe scarring, no nodularity or sign of recurrence, left breast no masses, nodules, skin changes.   ECOG PERFORMANCE STATUS: 0 - Asymptomatic      LABORATORY DATA: Lab Results  Component Value Date   WBC 6.3 11/11/2012   HGB 13.6 11/11/2012   HCT 39.6 11/11/2012   MCV 87.3 11/11/2012   PLT 167 11/11/2012      Chemistry      Component Value Date/Time   NA 141 11/11/2012 1020   NA 139 11/03/2012 1054   K 4.1 11/11/2012 1020   K 4.2 11/03/2012 1054   CL 103 11/03/2012 1054   CO2 26 11/11/2012 1020   CO2 29 11/03/2012 1054   BUN 31.6* 11/11/2012 1020   BUN 25*  11/03/2012 1054   CREATININE 1.5* 11/11/2012 1020   CREATININE 1.39* 11/03/2012 1054   CREATININE 1.43* 11/12/2011 1405      Component Value Date/Time   CALCIUM 10.1 11/11/2012 1020   CALCIUM 9.8 11/03/2012 1054   ALKPHOS 78 11/11/2012 1020   ALKPHOS 68 10/17/2012 1547   AST 19 11/11/2012 1020   AST 19 10/17/2012 1547   ALT 17 11/11/2012 1020   ALT 16 10/17/2012 1547   BILITOT 0.43 11/11/2012 1020   BILITOT 0.3 10/17/2012 1547       RADIOGRAPHIC STUDIES:  No results found.  ASSESSMENT:   Patient is an 79 year old female with h/o stage IIIC ER positive, PR positive, HER-2/neu positive invasive ductal carcinoma of the right breast.  She has completed mastectomy, adjuvant chemotherapy and Herceptin therapy, along with radiation and 10 years of anti-estrogen therapy.  She is doing well, and has no concerns.  She does have a primary care physician, and would like to have her follow her and send her to med onc if need be in the future.     PLAN:   Doing well.  No sign of recurrence.  Patient will continue with calcium and Vitamin D.  We will f/u with her on an as needed basis.  She will continue with bone density tests, and mammograms as ordered by her PCP.     All questions were answered. The patient knows to call the clinic with any problems, questions or concerns. We can certainly see the patient much sooner if  necessary.  I spent 40 minutes counseling the patient face to face. The total time spent in the appointment was 60 minutes.  Cherie Ouch Lyn Hollingshead, NP Medical Oncology [facility] Phone: [phone] 11/25/2012, 2:57 PM  Attending note: Patient was seen and examined with Ms. Augustin Schooling.  Patient is 79 year old female with history of stage IIIc ER positive HER-2 positive breast cancer originally diagnosed in 2001. She underwent a right mastectomy with axillary lymph node dissection all 20 lymph nodes were positive for disease. She received adjuvant curative intent chemotherapy consisting of TCH 6 cycles followed by one year of adjuvant Herceptin. She then received adjuvant radiation therapy completed all of her therapy December 2001. Patient was begun on Arimidex in Fabry 2002 and completed 10 years up this. Overall she's doing well has no evidence of recurrent disease.  Overall patient is doing well we will now followup on an as-needed basis and she is 10 years out from her original diagnosis. However she knows to call us with any problems questions or concerns.  Drue Second, MD Medical/Oncology [facility] [phone] (beeper) [phone] (Office)  01/10/2013, 9:07 AM

## 2012-11-28 ENCOUNTER — Ambulatory Visit: Payer: Medicare Other | Admitting: Family Medicine

## 2013-01-12 ENCOUNTER — Encounter: Payer: Self-pay | Admitting: Family Medicine

## 2013-01-12 ENCOUNTER — Ambulatory Visit (INDEPENDENT_AMBULATORY_CARE_PROVIDER_SITE_OTHER): Payer: Medicare Other | Admitting: Family Medicine

## 2013-01-12 VITALS — BP 122/70 | HR 76 | Ht 61.0 in | Wt 155.0 lb

## 2013-01-12 DIAGNOSIS — M899 Disorder of bone, unspecified: Secondary | ICD-10-CM

## 2013-01-12 DIAGNOSIS — I10 Essential (primary) hypertension: Secondary | ICD-10-CM

## 2013-01-12 DIAGNOSIS — M8580 Other specified disorders of bone density and structure, unspecified site: Secondary | ICD-10-CM

## 2013-01-12 NOTE — Progress Notes (Signed)
Chief Complaint  Patient presents with  . Hypertension    follow up.   Patient presents for follow up on her blood pressure.  Her meds were changed at her last visit from benazepril HCT to lisinopril HCT for cost purposes.  She brought her machine to be checked, and states it was a little higher than what our nurse got (131 systolic, and slightly higher diastolic), but within 10 points.  She denies any side effects or problems.  She brings in her readings:  108-153/57-75, mostly 120's-130's/70, pulse 71-81.  She brings in a certificate showing that she graduated from her cancer treatment.  She will no longer be followed by oncologist. She is 13 years since her diagnosis (07/1999).  She is asking about when her next DEXA is due.  She is due for her mammogram and plans to call to schedule. Review of her chart shows that her last DEXA was 12/2011, T-1.0.  This was after being off fosamax for 2 years, and there was no decline.  Past Medical History  Diagnosis Date  . Hypertension   . Hyperlipidemia   . Arthritis     OA spine, hands  . DDD (degenerative disc disease), lumbar   . Adenomatous colon polyp 1/03  . Abnormal brain MRI 8/01    small hemorrhagic stroke and possible cavernous hemangioma (Dr. Sandria Manly)  . Osteopenia     (DEXA's done by Dr. Donnie Coffin); prev took Fosamax.  No change in DEXA after off Fosamax x 2 years  . Postmenopausal bleeding 2000    benign EMB (Dr. Stefano Gaul)  . GERD (gastroesophageal reflux disease)   . Herpes zoster 2003  . Onychomycosis 2003, 2008    treated with Lamisil  . Breast cancer 2001    R breast (T3N1, ER/PR+, HER-2 +) s/p mastectomy, chemo and chest wall irradiation (Dr. Donnie Coffin)  . Urge urinary incontinence   . Ovarian cyst 12/2008    right   Past Surgical History  Procedure Laterality Date  . Tubal ligation    . Tonsillectomy    . Modified mastectomy and tram flap reconstruction  2001    R breast  . Tram flap removal      problems with flap after  radiation  . Cholecystectomy, laparoscopic  1/05  . Colonoscopy w/ polypectomy  05/23/01    adenomatous polyp  . Colonoscopy  12/17/08    normal (Dr. Laural Benes)  . Cataract extraction  summer 2011    bilateral  . Hearing loss      hearing aids 2012   History   Social History  . Marital Status: Married    Spouse Name: N/A    Number of Children: 3  . Years of Education: N/A   Occupational History  . Not on file.   Social History Main Topics  . Smoking status: Never Smoker   . Smokeless tobacco: Never Used  . Alcohol Use: No  . Drug Use: No  . Sexual Activity: Not Currently     Comment: due to poor libido   Other Topics Concern  . Not on file   Social History Narrative   Lives with husband, cat.  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  , Disp: , Rfl: ;  calcium carbonate (TUMS - DOSED IN MG ELEMENTAL CALCIUM) 500 MG chewable tablet, Chew 1 tablet by mouth as needed.  , Disp: , Rfl: ;  Calcium Carbonate-Vitamin D (CALTRATE 600+D) 600-400 MG-UNIT per tablet, Take 2 tablets by mouth daily.  , Disp: , Rfl:  celecoxib (CELEBREX) 200 MG capsule, Take 1 capsule (200 mg total) by mouth daily., Disp: 90 capsule, Rfl: 1;  lisinopril-hydrochlorothiazide (PRINZIDE,ZESTORETIC) 20-12.5 MG per tablet, Take 2 tablets by mouth daily., Disp: 180 tablet, Rfl: 1;  Multiple Vitamins-Minerals (CENTRUM SILVER PO), Take 1 tablet by mouth daily.  , Disp: , Rfl: ;  omeprazole (PRILOSEC) 20 MG capsule, Take 20 mg by mouth as needed.  , Disp: , Rfl:  pravastatin (PRAVACHOL) 20 MG tablet, Take 1 tablet (20 mg total) by mouth daily., Disp: 90 tablet, Rfl: 1;  Probiotic Product (ALIGN PO), Take 1 tablet by mouth daily., Disp: , Rfl: ;  solifenacin (VESICARE) 5 MG tablet, Take 1-2 tablets (5-10 mg total) by mouth daily., Disp: 145 tablet, Rfl: 1;  [DISCONTINUED] solifenacin (VESICARE) 5 MG tablet, Take 1  tablet (5 mg total) by mouth daily., Disp: 90 tablet, Rfl: 0  Allergies  Allergen Reactions  . Cephalexin Itching  . Ciprofloxacin Itching  . Diclofenac Nausea Only    Upset stomach  . Shellfish-Derived Products Nausea And Vomiting   ROS:  Denies fever, chills, headaches, dizziness, nausea, vomiting, bowel changes, cough.  No chest pain, shortness of breath, edema.  Denies any complaints today, just PND and slight chest congestion in the mornings.  PHYSICAL EXAM: BP 122/70  Pulse 76  Ht 5\' 1"  (1.549 m)  Wt 155 lb (70.308 kg)  BMI 29.3 kg/m2  Well developed, pleasant female, with some throat clearing noted during exam.  No cough Neck: no lymphadenopathy or mass Heart: regular rate and rhythm Lungs: clear bilaterally Extremities: no edema.   Skin: no rash Psych: normal mood, affect  ASSESSMENT/PLAN:  Essential hypertension, benign - well controlled on current, less expensive, drug regimen  Osteopenia - last DEXA was normal, 2 years off of Fosamax.  Consider repeating DEXA in a couple of years, but does NOT need to be every 2 years at this point   Mild allergies with PND.  Can use claritin as needed, mucinex prn.  F/u as scheduled in December for CPE   Return for high dose flu shot when available

## 2013-01-12 NOTE — Patient Instructions (Addendum)
Your chest/throat concerns are likely related to postnasal drainage from allergies.  You may use claritin and/or mucinex as needed Continue your current medications. You do not need another DEXA for at least 2-3 years (if you even need one then--will discuss at that time).  NOT needed 12/2013 since the last one was normal.

## 2013-01-17 ENCOUNTER — Other Ambulatory Visit: Payer: Self-pay

## 2013-01-17 DIAGNOSIS — Z1231 Encounter for screening mammogram for malignant neoplasm of breast: Secondary | ICD-10-CM

## 2013-01-17 DIAGNOSIS — Z853 Personal history of malignant neoplasm of breast: Secondary | ICD-10-CM

## 2013-01-17 DIAGNOSIS — Z9011 Acquired absence of right breast and nipple: Secondary | ICD-10-CM

## 2013-02-07 ENCOUNTER — Telehealth: Payer: Self-pay | Admitting: Family Medicine

## 2013-02-07 NOTE — Telephone Encounter (Signed)
Called and left a message to let her know that we now have the high dose flu shot, if they still needed to make an apt to get it.

## 2013-02-17 ENCOUNTER — Encounter: Payer: Self-pay | Admitting: Internal Medicine

## 2013-02-20 ENCOUNTER — Ambulatory Visit
Admission: RE | Admit: 2013-02-20 | Discharge: 2013-02-20 | Disposition: A | Discharge status: Home / Self Care | Payer: Medicare Other | Source: Ambulatory Visit

## 2013-02-20 DIAGNOSIS — Z1231 Encounter for screening mammogram for malignant neoplasm of breast: Secondary | ICD-10-CM

## 2013-02-20 DIAGNOSIS — Z853 Personal history of malignant neoplasm of breast: Secondary | ICD-10-CM

## 2013-02-20 DIAGNOSIS — Z9011 Acquired absence of right breast and nipple: Secondary | ICD-10-CM

## 2013-05-01 ENCOUNTER — Ambulatory Visit (INDEPENDENT_AMBULATORY_CARE_PROVIDER_SITE_OTHER): Payer: Medicare Other | Admitting: Family Medicine

## 2013-05-01 ENCOUNTER — Encounter: Payer: Self-pay | Admitting: Family Medicine

## 2013-05-01 VITALS — BP 150/80 | HR 84 | Ht 61.0 in | Wt 158.0 lb

## 2013-05-01 DIAGNOSIS — C50911 Malignant neoplasm of unspecified site of right female breast: Secondary | ICD-10-CM

## 2013-05-01 DIAGNOSIS — Z23 Encounter for immunization: Secondary | ICD-10-CM

## 2013-05-01 DIAGNOSIS — C50919 Malignant neoplasm of unspecified site of unspecified female breast: Secondary | ICD-10-CM

## 2013-05-01 DIAGNOSIS — R002 Palpitations: Secondary | ICD-10-CM

## 2013-05-01 DIAGNOSIS — E7800 Pure hypercholesterolemia, unspecified: Secondary | ICD-10-CM

## 2013-05-01 DIAGNOSIS — Z01419 Encounter for gynecological examination (general) (routine) without abnormal findings: Secondary | ICD-10-CM

## 2013-05-01 DIAGNOSIS — I10 Essential (primary) hypertension: Secondary | ICD-10-CM

## 2013-05-01 DIAGNOSIS — N3941 Urge incontinence: Secondary | ICD-10-CM

## 2013-05-01 DIAGNOSIS — M1990 Unspecified osteoarthritis, unspecified site: Secondary | ICD-10-CM

## 2013-05-01 DIAGNOSIS — Z0000 Encounter for general adult medical examination without abnormal findings: Secondary | ICD-10-CM

## 2013-05-01 LAB — COMPREHENSIVE METABOLIC PANEL
ALT: 17 U/L (ref 0–35)
AST: 19 U/L (ref 0–37)
Albumin: 4 g/dL (ref 3.5–5.2)
Alkaline Phosphatase: 77 U/L (ref 39–117)
BUN: 29 mg/dL — ABNORMAL HIGH (ref 6–23)
CO2: 30 mEq/L (ref 19–32)
Calcium: 9.3 mg/dL (ref 8.4–10.5)
Chloride: 101 mEq/L (ref 96–112)
Creat: 1.11 mg/dL — ABNORMAL HIGH (ref 0.50–1.10)
Glucose, Bld: 83 mg/dL (ref 70–99)
Potassium: 4.1 mEq/L (ref 3.5–5.3)
Sodium: 140 mEq/L (ref 135–145)
Total Bilirubin: 0.4 mg/dL (ref 0.3–1.2)
Total Protein: 6.5 g/dL (ref 6.0–8.3)

## 2013-05-01 LAB — LIPID PANEL
Cholesterol: 161 mg/dL (ref 0–200)
HDL: 39 mg/dL — ABNORMAL LOW (ref >39)
LDL Cholesterol: 88 mg/dL (ref 0–99)
Total CHOL/HDL Ratio: 4.1 Ratio
Triglycerides: 172 mg/dL — ABNORMAL HIGH (ref <150)
VLDL: 34 mg/dL (ref 0–40)

## 2013-05-01 LAB — POCT URINALYSIS DIPSTICK
Bilirubin, UA: NEGATIVE
Blood, UA: NEGATIVE
Glucose, UA: NEGATIVE
Ketones, UA: NEGATIVE
Leukocytes, UA: NEGATIVE
Nitrite, UA: NEGATIVE
Protein, UA: NEGATIVE
Spec Grav, UA: 1.01
Urobilinogen, UA: NEGATIVE
pH, UA: 5

## 2013-05-01 MED ORDER — CELECOXIB 200 MG PO CAPS: 200.0000 mg | ORAL_CAPSULE | Freq: Every day | ORAL | Status: DC

## 2013-05-01 MED ORDER — LISINOPRIL-HYDROCHLOROTHIAZIDE 20-12.5 MG PO TABS: 2.0000 | ORAL_TABLET | Freq: Every day | ORAL | Status: DC

## 2013-05-01 MED ORDER — PRAVASTATIN SODIUM 20 MG PO TABS: 20.0000 mg | ORAL_TABLET | Freq: Every day | ORAL | Status: DC

## 2013-05-01 MED ORDER — TOLTERODINE TARTRATE ER 2 MG PO CP24: 2.0000 mg | ORAL_CAPSULE | Freq: Every day | ORAL | Status: DC

## 2013-05-01 NOTE — Patient Instructions (Addendum)
  HEALTH MAINTENANCE RECOMMENDATIONS:  It is recommended that you get at least 30 minutes of aerobic exercise at least 5 days/week (for weight loss, you may need as much as 60-90 minutes). This can be any activity that gets your heart rate up. This can be divided in 10-15 minute intervals if needed, but try and build up your endurance at least once a week.  Weight bearing exercise is also recommended twice weekly.  Eat a healthy diet with lots of vegetables, fruits and fiber.  "Colorful" foods have a lot of vitamins (ie green vegetables, tomatoes, red peppers, etc).  Limit sweet tea, regular sodas and alcoholic beverages, all of which has a lot of calories and sugar.  Up to 1 alcoholic drink daily may be beneficial for women (unless trying to lose weight, watch sugars).  Drink a lot of water.  Calcium recommendations are 1200-1500 mg daily (1500 mg for postmenopausal women or women without ovaries), and vitamin D 1000 IU daily.  This should be obtained from diet and/or supplements (vitamins), and calcium should not be taken all at once, but in divided doses.  Monthly self breast exams and yearly mammograms for women over the age of 62 is recommended.  Sunscreen of at least SPF 30 should be used on all sun-exposed parts of the skin when outside between the hours of 10 am and 4 pm (not just when at beach or pool, but even with exercise, golf, tennis, and yard work!)  Use a sunscreen that says "broad spectrum" so it covers both UVA and UVB rays, and make sure to reapply every 1-2 hours.  Remember to change the batteries in your smoke detectors when changing your clock times in the spring and fall.  Use your seat belt every time you are in a car, and please drive safely and not be distracted with cell phones and texting while driving.   Palpitations at night-- Recommended checking pulse--to see if it is regular or irregular, and how fast it actually is running.  If <120, likely sinus tachycardia, if  faster, would recommend additional monitoring to look for arrhythmia.  Blood pressures are a little high.  Continue low sodium diet.  Increase exercise to at least 30 minutes 5 days/week.  Continue monitoring BP elsewhere, goal <140/90.  Weight loss is encouraged, and will help lower blood pressure.

## 2013-05-01 NOTE — Progress Notes (Addendum)
Chief Complaint  Patient presents with  . fasting physical    fasting physical, heart has been racing some,    Patient presents for a complete physical, as well as follow-up on her chronic medical problems.  Today she is complaining of her heart racing.  She notices this in the middle of the night, after she goes to the bathroom, and is cold, and is rushing to get back to bed.  A couple of weeks ago she felt like it took longer than normal for the heart rate to come down.  She does not notice symptoms any other time of day, with activity or exercise.  There was no associated shortness of breath or chest pain.  Hypertension follow-up:  Blood pressures elsewhere are 114/69 to 160/80, often 140/70.  Denies dizziness, headaches, chest pain.  Denies side effects of medications. Denies any swelling in her feet, cough.  Arthritis--doing well with Celebrex. Pain is in back, feet and hands, and medication is effective at reducing pain. Previously tried tylenol without adequate pain relief. Denies any GI side effects, takes with her breakfast.   Urge urinary incontinence--improved with Vesicare initially, but still having a lot of urgency, sometimes extreme. She tried taking 10mg , but didn't notice any further improvement, so went back to taking just the 5mg .  She is asking to try a different medication to see if it would be more effective.  Denies any dysuria, odor or hematuria. She gets up 2-3 times/night.  Hyperlipidemia--on low cholesterol diet, compliant with pravastatin, denies side effects, myalgias.  Health Maintenance: Immunization History  Administered Date(s) Administered  . Influenza Split 02/02/2011, 02/02/2012, 01/19/2013  . Pneumococcal Polysaccharide-23 06/04/2005  . Tdap 06/04/2005  . Zoster 06/04/2005   Last Pap smear: 6/09  Last mammogram: 02/21/13 Last colonoscopy: 12/2008  Last DEXA: per Dr. Donnie Coffin, 12/2011  Dentist: regular  Ophtho: yearly  Exercise: she has been walking only  3/4 mile (since she lost a toenail after walking 5K in November), 3 days/week  Past Medical History  Diagnosis Date  . Hypertension   . Hyperlipidemia   . Arthritis     OA spine, hands  . DDD (degenerative disc disease), lumbar   . Adenomatous colon polyp 1/03  . Abnormal brain MRI 8/01    small hemorrhagic stroke and possible cavernous hemangioma (Dr. Sandria Manly)  . Osteopenia     (DEXA's done by Dr. Donnie Coffin); prev took Fosamax.  No change in DEXA after off Fosamax x 2 years  . Postmenopausal bleeding 2000    benign EMB (Dr. Stefano Gaul)  . GERD (gastroesophageal reflux disease)   . Herpes zoster 2003  . Onychomycosis 2003, 2008    treated with Lamisil  . Breast cancer 2001    R breast (T3N1, ER/PR+, HER-2 +) s/p mastectomy, chemo and chest wall irradiation (Dr. Donnie Coffin)  . Urge urinary incontinence   . Ovarian cyst 12/2008    right    Past Surgical History  Procedure Laterality Date  . Tubal ligation    . Tonsillectomy    . Modified mastectomy and tram flap reconstruction  2001    R breast  . Tram flap removal      problems with flap after radiation  . Cholecystectomy, laparoscopic  1/05  . Colonoscopy w/ polypectomy  05/23/01    adenomatous polyp  . Colonoscopy  12/17/08    normal (Dr. Laural Benes)  . Cataract extraction  summer 2011    bilateral  . Hearing loss      hearing aids 2012  History   Social History  . Marital Status: Married    Spouse Name: N/A    Number of Children: 3  . Years of Education: N/A   Occupational History  . Not on file.   Social History Main Topics  . Smoking status: Never Smoker   . Smokeless tobacco: Never Used  . Alcohol Use: No  . Drug Use: No  . Sexual Activity: Not Currently     Comment: due to poor libido   Other Topics Concern  . Not on file   Social History Narrative   Lives with husband, cat.  1 daughter at [facility]--hospitalist; daughter in [place], son in [place]    Family History  Problem Relation Age of Onset  . Heart  disease Mother   . Hypertension Mother   . Heart disease Father   . Hypertension Father   . Diabetes Brother   . Heart disease Brother   . Drug abuse Neg Hx   . Cancer Neg Hx    Current Outpatient Prescriptions on File Prior to Visit  Medication Sig Dispense Refill  . aspirin 81 MG tablet Take 81 mg by mouth daily.        . calcium carbonate (TUMS - DOSED IN MG ELEMENTAL CALCIUM) 500 MG chewable tablet Chew 1 tablet by mouth as needed.        . Calcium Carbonate-Vitamin D (CALTRATE 600+D) 600-400 MG-UNIT per tablet Take 2 tablets by mouth daily.        . Multiple Vitamins-Minerals (CENTRUM SILVER PO) Take 1 tablet by mouth daily.        Marland Kitchen omeprazole (PRILOSEC) 20 MG capsule Take 20 mg by mouth as needed.        . Probiotic Product (ALIGN PO) Take 1 tablet by mouth daily.      . [DISCONTINUED] solifenacin (VESICARE) 5 MG tablet Take 1 tablet (5 mg total) by mouth daily.  90 tablet  0   No current facility-administered medications on file prior to visit.    Allergies  Allergen Reactions  . Cephalexin Itching  . Ciprofloxacin Itching  . Diclofenac Nausea Only    Upset stomach  . Shellfish-Derived Products Nausea And Vomiting    ROS: The patient denies anorexia, fever, weight changes, headaches, vision changes, ear pain, sore throat, breast concerns, chest pain, dizziness, syncope, dyspnea on exertion, cough, swelling, nausea, vomiting, diarrhea, constipation, abdominal pain, melena, hematochezia, indigestion/heartburn, hematuria, dysuria, vaginal bleeding, discharge, odor or itch, genital lesions, weakness, tremor, suspicious skin lesions, depression, anxiety, abnormal bleeding/bruising, or enlarged lymph nodes.  ongoing numbness in fingers and toes since her chemo. +urge urinary incontinence, not adequately controlled with vesicare (see HPI).Marland Kitchen +joint pains in hands/fingers/feet--relieved by Celebrex. +hearing loss--has hearing aids. Denies any thyroid symptoms. Sore in the roof of her  mouth since yesterday.  PHYSICAL EXAM:  BP 150/80  Pulse 84  Ht 5\' 1"  (1.549 m)  Wt 158 lb (71.668 kg)  BMI 29.87 kg/m2 150/70 on repeat by MD  General Appearance:  Alert, cooperative, no distress, appears stated age   Head:  Normocephalic, without obvious abnormality, atraumatic   Eyes:  PERRL, conjunctiva/corneas clear, EOM's intact, fundi  benign   Ears:  Wearing hearing aids  Nose:  Nares normal, mucosa normal, no drainage or sinus tenderness   Throat:  Lips, mucosa, and tongue normal; teeth and gums normal.  No sores/lesions in upper palate where she complains of discomfort  Neck:  Supple, no lymphadenopathy; thyroid: no enlargement/tenderness/nodules; no carotid  bruit  or JVD   Back:  Spine nontender, no curvature, ROM normal, no CVA tenderness   Lungs:  Clear to auscultation bilaterally without wheezes, rales or ronchi; respirations unlabored   Chest Wall:  No tenderness or deformity   Heart:  Regular rate and rhythm, S1 and S2 normal, no murmur, rub  or gallop.  Occasional ectopic beats  Breast Exam:  R breast absent, well healed surgical scars, nontender. L breast normal-- no masses, or nipple discharge or inversion (nipple is mildly inverted, chronic). No axillary lymphadenopathy   Abdomen:  Soft, non-tender, nondistended, normoactive bowel sounds,  no masses, no hepatosplenomegaly   Genitalia:  Normal external genitalia without lesions. BUS and vagina normal; no cervical motion tenderness. No abnormal vaginal discharge. Uterus normal size, nontender, no masses. No adnexal masses or tenderness. Pap not performed   Rectal:  Normal tone, no masses or tenderness; guaiac negative stool   Extremities:  No clubbing or cyanosis. Trace pedal edema Deformity to distal phalanx of L fingers, arthritic changes.  Wearing lymphedema sleeve on RUE   Pulses:  2+ and symmetric all extremities   Skin:  Skin color, texture, turgor normal, no rashes or lesions   Lymph nodes:  Cervical,  supraclavicular, and axillary nodes normal   Neurologic:  CNII-XII intact, normal strength, sensation and gait; reflexes 2+ and symmetric throughout          Psych: Normal mood, affect, hygiene and grooming.   ASSESSMENT/PLAN:  Routine general medical examination at a health care facility - Plan: Urinalysis Dipstick, TSH  Essential hypertension, benign - borderline control.  cont. current meds.  daily exercise, weight loss, low sodium diet.   - Plan: lisinopril-hydrochlorothiazide (PRINZIDE,ZESTORETIC) 20-12.5 MG per tablet, Comprehensive metabolic panel, Lipid panel  Pure hypercholesterolemia - Plan: pravastatin (PRAVACHOL) 20 MG tablet, Lipid panel  Breast cancer, right breast - she has been released from oncologist care.    Urge incontinence of urine - suboptimally controlled with vesicare. Trial of Detrol LA - Plan: tolterodine (DETROL LA) 2 MG 24 hr capsule  OA (osteoarthritis) - well controlled.  reviewed risks/side effects of Celebrex. due for labs/monitoring of renal function - Plan: celecoxib (CELEBREX) 200 MG capsule  Need for prophylactic vaccination against Streptococcus pneumoniae (pneumococcus) - Plan: Pneumococcal conjugate vaccine 13-valent  Palpitations - limited to evenings, when rushing back from bathroom.  check labs.  pt to check pulse (rate, if irregular). If persistent/worsening, consider monitoring  Discussed monthly self breast exams and yearly mammograms; at least 30 minutes of aerobic activity at least 5 days/week; proper sunscreen use reviewed; healthy diet, including goals of calcium and vitamin D intake and alcohol recommendations (less than or equal to 1 drink/day) reviewed; regular seatbelt use; changing batteries in smoke detectors. Immunization recommendations discussed--Prevnar-13 recommended; pneumovax >5 years ago.  Colonoscopy recommendations reviewed--UTD.  Pap smear not indicated due to age and low risk.  Palpitations at night-- Recommended checking  pulse--to see if it is regular or irregular, and how fast it actually is running.  If <120, likely sinus tachycardia, if faster, would recommend additional monitoring to look for arrhythmia.  HTN--borderline control Continue low sodium diet.  Increase exercise to at least 30 minutes 5 days/week.  Continue monitoring BP elsewhere, goal <140/90.  Weight loss is encouraged, and will help lower blood pressure.  She has a living will and a healthcare power of attorney.  Full Code, Full Care.  F/u 6 months for med check, sooner prn

## 2013-05-02 ENCOUNTER — Encounter: Payer: Self-pay | Admitting: Family Medicine

## 2013-05-02 LAB — TSH: TSH: 3.206 u[IU]/mL (ref 0.350–4.500)

## 2013-05-11 ENCOUNTER — Telehealth: Payer: Self-pay | Admitting: *Deleted

## 2013-05-11 NOTE — Telephone Encounter (Signed)
Error

## 2013-05-18 ENCOUNTER — Other Ambulatory Visit: Payer: Medicare HMO

## 2013-05-23 ENCOUNTER — Telehealth: Payer: Self-pay | Admitting: Family Medicine

## 2013-05-23 ENCOUNTER — Other Ambulatory Visit: Payer: Self-pay | Admitting: Family Medicine

## 2013-05-23 DIAGNOSIS — M1990 Unspecified osteoarthritis, unspecified site: Secondary | ICD-10-CM

## 2013-05-23 DIAGNOSIS — N3941 Urge incontinence: Secondary | ICD-10-CM

## 2013-05-23 NOTE — Telephone Encounter (Signed)
Patient needs tolterodine tart 2mg  ER needs 90 day she is taking one a day And  Needs refill celebrex  200mg    Needs 90 day supply    [employer]   [phone]

## 2013-05-23 NOTE — Telephone Encounter (Signed)
Can you verify the pharmacy is correct (I put in [facility], but not sure if that is the correct pharmacy.  Can you double check with pt, and if that is what she wants (the [employer] order pharmacy), then go ahead and sign the pended orders.  I believe she might have issues with the celebrex being covered (change in formulary),so that may need to be addressed if rx doesn't go thru/not covered

## 2013-06-09 ENCOUNTER — Telehealth: Payer: Self-pay | Admitting: Family Medicine

## 2013-06-09 DIAGNOSIS — N3941 Urge incontinence: Secondary | ICD-10-CM

## 2013-06-09 MED ORDER — TOLTERODINE TARTRATE ER 2 MG PO CP24: 2.0000 mg | ORAL_CAPSULE | Freq: Every day | ORAL | Status: DC

## 2013-06-09 NOTE — Telephone Encounter (Signed)
It looks like the order that I pended (for #90 with a refill) on 1/20 got "refused" when I was just trying to verify her pharmacy before signing the order.  This message actually states which pharmacy, so refill was done

## 2013-06-21 NOTE — Telephone Encounter (Signed)
CELEBREX WAS APPROVED TIL 05/03/14

## 2013-06-22 NOTE — Telephone Encounter (Signed)
lm

## 2013-07-14 ENCOUNTER — Telehealth: Payer: Self-pay | Admitting: Internal Medicine

## 2013-07-14 DIAGNOSIS — M1990 Unspecified osteoarthritis, unspecified site: Secondary | ICD-10-CM

## 2013-07-14 MED ORDER — CELECOXIB 200 MG PO CAPS: 200.0000 mg | ORAL_CAPSULE | Freq: Every day | ORAL | Status: DC

## 2013-07-14 NOTE — Telephone Encounter (Signed)
Pt called stating that they were only filling her celebrex for 30 days and needed another order sent in for 90 days

## 2013-10-05 ENCOUNTER — Ambulatory Visit (INDEPENDENT_AMBULATORY_CARE_PROVIDER_SITE_OTHER): Payer: Medicare HMO | Admitting: Family Medicine

## 2013-10-05 ENCOUNTER — Encounter: Payer: Self-pay | Admitting: Family Medicine

## 2013-10-05 VITALS — BP 148/80 | HR 100 | Ht 61.0 in | Wt 157.0 lb

## 2013-10-05 DIAGNOSIS — M79609 Pain in unspecified limb: Secondary | ICD-10-CM

## 2013-10-05 DIAGNOSIS — M79674 Pain in right toe(s): Secondary | ICD-10-CM

## 2013-10-05 LAB — CBC WITH DIFFERENTIAL/PLATELET
Basophils Absolute: 0 10*3/uL (ref 0.0–0.1)
Basophils Relative: 0 % (ref 0–1)
Eosinophils Absolute: 0.2 10*3/uL (ref 0.0–0.7)
Eosinophils Relative: 2 % (ref 0–5)
HCT: 39.6 % (ref 36.0–46.0)
Hemoglobin: 13.3 g/dL (ref 12.0–15.0)
Lymphocytes Relative: 12 % (ref 12–46)
Lymphs Abs: 1.2 10*3/uL (ref 0.7–4.0)
MCH: 28.7 pg (ref 26.0–34.0)
MCHC: 33.6 g/dL (ref 30.0–36.0)
MCV: 85.3 fL (ref 78.0–100.0)
Monocytes Absolute: 0.6 10*3/uL (ref 0.1–1.0)
Monocytes Relative: 6 % (ref 3–12)
Neutro Abs: 8.3 10*3/uL — ABNORMAL HIGH (ref 1.7–7.7)
Neutrophils Relative %: 80 % — ABNORMAL HIGH (ref 43–77)
Platelets: 189 10*3/uL (ref 150–400)
RBC: 4.64 MIL/uL (ref 3.87–5.11)
RDW: 14.4 % (ref 11.5–15.5)
WBC: 10.4 10*3/uL (ref 4.0–10.5)

## 2013-10-05 LAB — URIC ACID: Uric Acid, Serum: 8.1 mg/dL — ABNORMAL HIGH (ref 2.4–7.0)

## 2013-10-05 MED ORDER — COLCHICINE 0.6 MG PO TABS: ORAL_TABLET | ORAL | Status: DC

## 2013-10-05 NOTE — Patient Instructions (Signed)
Increase your celebrex to twice daily for the next 3-5 days until pain has improved.  Use the colchrys as directed--2 tablets now, followed by an additional 1 tablet 1 hr later, if needed for persistent pain.  You can continue the colchrys twice daily until the pain completely resolves, if needed.  If you develop fevers, chills, or increasing redness/swelling/pain, let me know.  We may need to consider treating for infection (waiting to see white count).

## 2013-10-05 NOTE — Progress Notes (Signed)
Chief Complaint  Patient presents with  . Toe Pain    started Monday as joint pain-was able to walk 1.5 miles(mild pain). Pain is now so sever he can hardly walk. Pain is not starting in her left foot.    Patient presents accompanied by her husband,with complaint of pain in her right foot.  3 days ago, in the morning, she noticed pain and redness of the right 4th toe.  She has had similar problems in her hands, due to arthritis, which lasts a few days and goes away.  She was able to walk on Monday (only slight discomfort at first, then it seemed to be better), and the next day the pain was worse.  Anytime anything touched the toe, it would wake her up.  She then started having problems in the right 3rd toe.  Last night she started having pain in the great toe.  She has been taking her usual celebrex once daily, and then added a tylenol to help go to bed at night (didn't help).   Denies any trauma, injury, change in shoes.  Denies any known bite/sting.  Denies any change in diet--she had a small piece of steak the night before, but she does this weekly without problems.  She had been on a cruise, returned about a month ago.  She and her husband had both gotten ill with URI, but symptoms resolved.   Past Medical History  Diagnosis Date  . Hypertension   . Hyperlipidemia   . Arthritis     OA spine, hands  . DDD (degenerative disc disease), lumbar   . Adenomatous colon polyp 1/03  . Abnormal brain MRI 8/01    small hemorrhagic stroke and possible cavernous hemangioma (Dr. Erling Cruz)  . Osteopenia     (DEXA's done by Dr. Truddie Coco); prev took Fosamax.  No change in DEXA after off Fosamax x 2 years  . Postmenopausal bleeding 2000    benign EMB (Dr. Raphael Gibney)  . GERD (gastroesophageal reflux disease)   . Herpes zoster 2003  . Onychomycosis 2003, 2008    treated with Lamisil  . Breast cancer 2001    R breast (T3N1, ER/PR+, HER-2 +) s/p mastectomy, chemo and chest wall irradiation (Dr. Truddie Coco)  . Urge  urinary incontinence   . Ovarian cyst 12/2008    right   Past Surgical History  Procedure Laterality Date  . Tubal ligation    . Tonsillectomy    . Modified mastectomy and tram flap reconstruction  2001    R breast  . Tram flap removal      problems with flap after radiation  . Cholecystectomy, laparoscopic  1/05  . Colonoscopy w/ polypectomy  05/23/01    adenomatous polyp  . Colonoscopy  12/17/08    normal (Dr. Wynetta Emery)  . Cataract extraction  summer 2011    bilateral  . Hearing loss      hearing aids 2012   History   Social History  . Marital Status: Married    Spouse Name: N/A    Number of Children: 3  . Years of Education: N/A   Occupational History  . Not on file.   Social History Main Topics  . Smoking status: Never Smoker   . Smokeless tobacco: Never Used  . Alcohol Use: No  . Drug Use: No  . Sexual Activity: Not Currently     Comment: due to poor libido   Other Topics Concern  . Not on file   Social History Narrative  Lives with husband, cat.  1 daughter at [facility]--hospitalist; daughter in [place], son in [place]   Current Outpatient Prescriptions on File Prior to Visit  Medication Sig Dispense Refill  . aspirin 81 MG tablet Take 81 mg by mouth daily.        . Calcium Carbonate-Vitamin D (CALTRATE 600+D) 600-400 MG-UNIT per tablet Take 2 tablets by mouth daily.        . celecoxib (CELEBREX) 200 MG capsule Take 1 capsule (200 mg total) by mouth daily.  90 capsule  0  . lisinopril-hydrochlorothiazide (PRINZIDE,ZESTORETIC) 20-12.5 MG per tablet Take 2 tablets by mouth daily.  180 tablet  1  . Multiple Vitamins-Minerals (CENTRUM SILVER PO) Take 1 tablet by mouth daily.        Marland Kitchen omeprazole (PRILOSEC) 20 MG capsule Take 20 mg by mouth as needed.        . pravastatin (PRAVACHOL) 20 MG tablet Take 1 tablet (20 mg total) by mouth daily.  90 tablet  1  . tolterodine (DETROL LA) 2 MG 24 hr capsule Take 1 capsule (2 mg total) by mouth daily.  90 capsule  1  . calcium  carbonate (TUMS - DOSED IN MG ELEMENTAL CALCIUM) 500 MG chewable tablet Chew 1 tablet by mouth as needed.        . [DISCONTINUED] solifenacin (VESICARE) 5 MG tablet Take 1 tablet (5 mg total) by mouth daily.  90 tablet  0   No current facility-administered medications on file prior to visit.   Allergies  Allergen Reactions  . Cephalexin Itching  . Ciprofloxacin Itching  . Diclofenac Nausea Only    Upset stomach  . Shellfish-Derived Products Nausea And Vomiting   ROS:  Denies fevers, chills, nausea, vomiting. URI symptoms resolved.  No bleeding/bruising.  No chest pain, shortness of breath, cough, or other complaints, except as noted in HPI.  PHYSICAL EXAM: BP 148/80  Pulse 100  Ht '5\' 1"'  (1.549 m)  Wt 157 lb (71.215 kg)  BMI 29.68 kg/m2 Pleasant, very talkative female, in mild distress/discomfort during palpation of foot during exam  Right 4th toe--DIP is red, hot and swollen.  There is also some erythema of distal 3rd toe.  Only mild erythema of base of great toe (MTP), but very sensitive to touch.  The top of the 4th toe, in the midst of the erythema, gives a hint of two areas that are whitish--?slight nodules, vs purulence.  The area is not fluctuant (but VERY tender).  The erythema is very well demarcated, not extending in redness or swelling to the proximal phalanx. 2+ pulses.  No pedal edema  ASSESSMENT/PLAN:  Pain of toe of right foot - Plan: CBC with Differential, Uric Acid, Sedimentation Rate, colchicine 0.6 MG tablet  Check CBC, ESR and uric acid.  Doubt infection given lack of spread of erythema and swelling.  If elevated WBC or no response to treatment for gout, then add ABX to cover for cellulitis/abscess.  Treat for suspected gout--increase celebrex to BID for the next few days and start colchrys.  Risks/side effects of meds were reviewed.   F/u as scheduled for upcoming appointment, sooner prn

## 2013-10-06 ENCOUNTER — Telehealth: Payer: Self-pay

## 2013-10-06 LAB — SEDIMENTATION RATE: Sed Rate: 68 mm/hr — ABNORMAL HIGH (ref 0–22)

## 2013-10-06 MED ORDER — AMOXICILLIN-POT CLAVULANATE 875-125 MG PO TABS: 1.0000 | ORAL_TABLET | Freq: Two times a day (BID) | ORAL | Status: DC

## 2013-10-06 NOTE — Telephone Encounter (Signed)
Pt states she had a temp of 100.0 last night and informed her if she ran a temp to call and let you know, she wants to know if she needs antibiotics to get through the weekend. Please advise

## 2013-10-06 NOTE — Telephone Encounter (Signed)
Please see the result note, and advise pt of results.  I sent rx to pharmacy for Augmentin--to use if not seeing significant improvement with gout treatment alone. If it seems to be improving with gout treatment, and no increase in temps, then hold off on starting the Augmentin.  If persistent fevers, and toes not improving, start the antibiotics (and take full course)

## 2013-10-30 ENCOUNTER — Encounter: Payer: Self-pay | Admitting: Family Medicine

## 2013-10-30 ENCOUNTER — Ambulatory Visit (INDEPENDENT_AMBULATORY_CARE_PROVIDER_SITE_OTHER): Payer: Medicare HMO | Admitting: Family Medicine

## 2013-10-30 VITALS — BP 132/80 | HR 80 | Ht 61.0 in | Wt 160.0 lb

## 2013-10-30 DIAGNOSIS — I10 Essential (primary) hypertension: Secondary | ICD-10-CM

## 2013-10-30 DIAGNOSIS — M159 Polyosteoarthritis, unspecified: Secondary | ICD-10-CM | POA: Insufficient documentation

## 2013-10-30 DIAGNOSIS — M109 Gout, unspecified: Secondary | ICD-10-CM | POA: Insufficient documentation

## 2013-10-30 DIAGNOSIS — Z79899 Other long term (current) drug therapy: Secondary | ICD-10-CM

## 2013-10-30 DIAGNOSIS — M8949 Other hypertrophic osteoarthropathy, multiple sites: Secondary | ICD-10-CM

## 2013-10-30 DIAGNOSIS — N3941 Urge incontinence: Secondary | ICD-10-CM

## 2013-10-30 DIAGNOSIS — E7800 Pure hypercholesterolemia, unspecified: Secondary | ICD-10-CM

## 2013-10-30 DIAGNOSIS — M150 Primary generalized (osteo)arthritis: Secondary | ICD-10-CM

## 2013-10-30 MED ORDER — LISINOPRIL 40 MG PO TABS: 40.0000 mg | ORAL_TABLET | Freq: Every day | ORAL | Status: DC

## 2013-10-30 MED ORDER — CELECOXIB 200 MG PO CAPS: 200.0000 mg | ORAL_CAPSULE | Freq: Every day | ORAL | Status: DC

## 2013-10-30 MED ORDER — PRAVASTATIN SODIUM 20 MG PO TABS: 20.0000 mg | ORAL_TABLET | Freq: Every day | ORAL | Status: DC

## 2013-10-30 NOTE — Patient Instructions (Signed)
Low-Sodium Eating Plan Sodium raises blood pressure and causes water to be held in the body. Getting less sodium from food will help lower your blood pressure, reduce any swelling, and protect your heart, liver, and kidneys. We get sodium by adding salt (sodium chloride) to food. Most of our sodium comes from canned, boxed, and frozen foods. Restaurant foods, fast foods, and pizza are also very high in sodium. Even if you take medicine to lower your blood pressure or to reduce fluid in your body, getting less sodium from your food is important. WHAT IS MY PLAN? Most people should limit their sodium intake to 2,300 mg a day. Your health care provider recommends that you limit your sodium intake to __________ a day.  WHAT DO I NEED TO KNOW ABOUT THIS EATING PLAN? For the low-sodium eating plan, you will follow these general guidelines:  Choose foods with a % Daily Value for sodium of less than 5% (as listed on the food label).   Use salt-free seasonings or herbs instead of table salt or sea salt.   Check with your health care provider or pharmacist before using salt substitutes.   Eat fresh foods.  Eat more vegetables and fruits.  Limit canned vegetables. If you do use them, rinse them well to decrease the sodium.   Limit cheese to 1 oz (28 g) per day.   Eat lower-sodium products, often labeled as "lower sodium" or "no salt added."  Avoid foods that contain monosodium glutamate (MSG). MSG is sometimes added to [place] food and some canned foods.  Check food labels (Nutrition Facts labels) on foods to learn how much sodium is in one serving.  Eat more home-cooked food and less restaurant, buffet, and fast food.  When eating at a restaurant, ask that your food be prepared with less salt or none, if possible.  HOW DO I READ FOOD LABELS FOR SODIUM INFORMATION? The Nutrition Facts label lists the amount of sodium in one serving of the food. If you eat more than one serving, you must  multiply the listed amount of sodium by the number of servings. Food labels may also identify foods as:  Sodium free--Less than 5 mg in a serving.  Very low sodium--35 mg or less in a serving.  Low sodium--140 mg or less in a serving.  Light in sodium--50% less sodium in a serving. For example, if a food that usually has 300 mg of sodium is changed to become light in sodium, it will have 150 mg of sodium.  Reduced sodium--25% less sodium in a serving. For example, if a food that usually has 400 mg of sodium is changed to reduced sodium, it will have 300 mg of sodium. WHAT FOODS CAN I EAT? Grains Low-sodium cereals, including oats, puffed wheat and rice, and shredded wheat cereals. Low-sodium crackers. Unsalted rice and pasta. Lower-sodium bread.  Vegetables Frozen or fresh vegetables. Low-sodium or reduced-sodium canned vegetables. Low-sodium or reduced-sodium tomato sauce and paste. Low-sodium or reduced-sodium tomato and vegetable juices.  Fruits Fresh, frozen, and canned fruit. Fruit juice.  Meat and Other Protein Products Low-sodium canned tuna and salmon. Fresh or frozen meat, poultry, seafood, and fish. Lamb. Unsalted nuts. Dried beans, peas, and lentils without added salt. Unsalted canned beans. Homemade soups without salt. Eggs.  Dairy Milk. Soy milk. Ricotta cheese. Low-sodium or reduced-sodium cheeses. Yogurt.  Condiments Fresh and dried herbs and spices. Salt-free seasonings. Onion and garlic powders. Low-sodium varieties of mustard and ketchup. Lemon juice.  Fats and Oils  Reduced-sodium salad dressings. Unsalted butter.  Other Unsalted popcorn and pretzels.  The items listed above may not be a complete list of recommended foods or beverages. Contact your dietitian for more options. WHAT FOODS ARE NOT RECOMMENDED? Grains Instant hot cereals. Bread stuffing, pancake, and biscuit mixes. Croutons. Seasoned rice or pasta mixes. Noodle soup cups. Boxed or frozen  macaroni and cheese. Self-rising flour. Regular salted crackers. Vegetables Regular canned vegetables. Regular canned tomato sauce and paste. Regular tomato and vegetable juices. Frozen vegetables in sauces. Salted french fries. Olives. Angie Fava. Relishes. Sauerkraut. Salsa. Meat and Other Protein Products Salted, canned, smoked, spiced, or pickled meats, seafood, or fish. Bacon, ham, sausage, hot dogs, corned beef, chipped beef, and packaged luncheon meats. Salt pork. Jerky. Pickled herring. Anchovies, regular canned tuna, and sardines. Salted nuts. Dairy Processed cheese and cheese spreads. Cheese curds. Blue cheese and cottage cheese. Buttermilk.  Condiments Onion and garlic salt, seasoned salt, table salt, and sea salt. Canned and packaged gravies. Worcestershire sauce. Tartar sauce. Barbecue sauce. Teriyaki sauce. Soy sauce, including reduced sodium. Steak sauce. Fish sauce. Oyster sauce. Cocktail sauce. Horseradish. Regular ketchup and mustard. Meat flavorings and tenderizers. Bouillon cubes. Hot sauce. Tabasco sauce. Marinades. Taco seasonings. Relishes. Fats and Oils Regular salad dressings. Salted butter. Margarine. Ghee. Bacon fat.  Other Potato and tortilla chips. Corn chips and puffs. Salted popcorn and pretzels. Canned or dried soups. Pizza. Frozen entrees and pot pies.  The items listed above may not be a complete list of foods and beverages to avoid. Contact your dietitian for more information. Document Released: 10/10/2001 Document Revised: 04/25/2013 Document Reviewed: 02/22/2013 [facility] Patient Information 2015 [place], [place]. This information is not intended to replace advice given to you by your health care provider. Make sure you discuss any questions you have with your health care provider.   Gout Gout is an inflammatory arthritis caused by a buildup of uric acid crystals in the joints. Uric acid is a chemical that is normally present in the blood. When the level of  uric acid in the blood is too high it can form crystals that deposit in your joints and tissues. This causes joint redness, soreness, and swelling (inflammation). Repeat attacks are common. Over time, uric acid crystals can form into masses (tophi) near a joint, destroying bone and causing disfigurement. Gout is treatable and often preventable. CAUSES  The disease begins with elevated levels of uric acid in the blood. Uric acid is produced by your body when it breaks down a naturally found substance called purines. Certain foods you eat, such as meats and fish, contain high amounts of purines. Causes of an elevated uric acid level include:  Being passed down from parent to child (heredity).  Diseases that cause increased uric acid production (such as obesity, psoriasis, and certain cancers).  Excessive alcohol use.  Diet, especially diets rich in meat and seafood.  Medicines, including certain cancer-fighting medicines (chemotherapy), water pills (diuretics), and aspirin.  Chronic kidney disease. The kidneys are no longer able to remove uric acid well.  Problems with metabolism. Conditions strongly associated with gout include:  Obesity.  High blood pressure.  High cholesterol.  Diabetes. Not everyone with elevated uric acid levels gets gout. It is not understood why some people get gout and others do not. Surgery, joint injury, and eating too much of certain foods are some of the factors that can lead to gout attacks. SYMPTOMS   An attack of gout comes on quickly. It causes intense pain with redness, swelling,  and warmth in a joint.  Fever can occur.  Often, only one joint is involved. Certain joints are more commonly involved:  Base of the big toe.  Knee.  Ankle.  Wrist.  Finger. Without treatment, an attack usually goes away in a few days to weeks. Between attacks, you usually will not have symptoms, which is different from many other forms of arthritis. DIAGNOSIS    Your caregiver will suspect gout based on your symptoms and exam. In some cases, tests may be recommended. The tests may include:  Blood tests.  Urine tests.  X-rays.  Joint fluid exam. This exam requires a needle to remove fluid from the joint (arthrocentesis). Using a microscope, gout is confirmed when uric acid crystals are seen in the joint fluid. TREATMENT  There are two phases to gout treatment: treating the sudden onset (acute) attack and preventing attacks (prophylaxis).  Treatment of an Acute Attack.  Medicines are used. These include anti-inflammatory medicines or steroid medicines.  An injection of steroid medicine into the affected joint is sometimes necessary.  The painful joint is rested. Movement can worsen the arthritis.  You may use warm or cold treatments on painful joints, depending which works best for you.  Treatment to Prevent Attacks.  If you suffer from frequent gout attacks, your caregiver may advise preventive medicine. These medicines are started after the acute attack subsides. These medicines either help your kidneys eliminate uric acid from your body or decrease your uric acid production. You may need to stay on these medicines for a very long time.  The early phase of treatment with preventive medicine can be associated with an increase in acute gout attacks. For this reason, during the first few months of treatment, your caregiver may also advise you to take medicines usually used for acute gout treatment. Be sure you understand your caregiver's directions. Your caregiver may make several adjustments to your medicine dose before these medicines are effective.  Discuss dietary treatment with your caregiver or dietitian. Alcohol and drinks high in sugar and fructose and foods such as meat, poultry, and seafood can increase uric acid levels. Your caregiver or dietician can advise you on drinks and foods that should be limited. HOME CARE INSTRUCTIONS   Do  not take aspirin to relieve pain. This raises uric acid levels.  Only take over-the-counter or prescription medicines for pain, discomfort, or fever as directed by your caregiver.  Rest the joint as much as possible. When in bed, keep sheets and blankets off painful areas.  Keep the affected joint raised (elevated).  Apply warm or cold treatments to painful joints. Use of warm or cold treatments depends on which works best for you.  Use crutches if the painful joint is in your leg.  Drink enough fluids to keep your urine clear or pale yellow. This helps your body get rid of uric acid. Limit alcohol, sugary drinks, and fructose drinks.  Follow your dietary instructions. Pay careful attention to the amount of protein you eat. Your daily diet should emphasize fruits, vegetables, whole grains, and fat-free or low-fat milk products. Discuss the use of coffee, vitamin C, and cherries with your caregiver or dietician. These may be helpful in lowering uric acid levels.  Maintain a healthy body weight. SEEK MEDICAL CARE IF:   You develop diarrhea, vomiting, or any side effects from medicines.  You do not feel better in 24 hours, or you are getting worse. SEEK IMMEDIATE MEDICAL CARE IF:   Your joint becomes suddenly  more tender, and you have chills or a fever. MAKE SURE YOU:   Understand these instructions.  Will watch your condition.  Will get help right away if you are not doing well or get worse. Document Released: 04/17/2000 Document Revised: 08/15/2012 Document Reviewed: 12/02/2011 [facility] Patient Information 2015 [place], [place]. This information is not intended to replace advice given to you by your health care provider. Make sure you discuss any questions you have with your health care provider.   We are changing your blood pressure medication from lisinopril HCT to plain lisinopril (the HCTZ can cause increased uric acid levels, causing gout flares). Take just one pill  daily. Without the extra diuretic on board, you might notice some increase in swelling (so be sure to limit the sodium in the diet) and also rise in blood pressure.  Continue to monitor regularly, and bring your list of blood pressures to your next visit.  Get your fasting bloodwork done prior to your next visit, so that we can review the results at the visit.

## 2013-10-30 NOTE — Progress Notes (Signed)
Chief Complaint  Patient presents with  . Hypertension    med check(had banana). Does state that her toes were doing much better until this past weekend.    Gout of R great toe--symptoms had completely resolved.  She increased the celebrex to twice daily just for 2 days, and the colchicine (2 for first dose, then 2nd dose later the first day, then 1 daily for the next few days).  She states she has 9 pills left.  Her left great toe (bunion area) started bothering her yesterday, and the right great toe woke her up last night due to pain.  She hasn't taken any colchicine today, just her routine celebrex.  Pain isn't nearly as bad as it was at her last visit.  The right 3rd and 4th toes ended up peeling, and are no longer painful (s/p a course of antibiotics).  Hypertension follow-up: Blood pressures elsewhere are 120-147/65-81.  Once had 177/85 when she forgot to take her medication. Denies dizziness, headaches, chest pain. Denies side effects of medications. Denies any swelling in her feet, cough.   Arthritis--doing well with Celebrex once daily. Pain is in back, feet and hands, and medication is effective at reducing pain. Sometimes she will have a flare with a sore finger joint for up to a week. Previously tried tylenol without adequate pain relief. Denies any GI side effects, takes with her breakfast.   Urge urinary incontinence--We switched from Vesicare to Detrol at last visit.  It seems to be more effective, and she is doing better.  Denies any significant side effects.  Denies any dysuria, odor or hematuria. She still gets up 2-3 times/night; daytime urgency/frequency has improved.  Only sometimes has leaking when going to the bathroom in the middle of the night.   Hyperlipidemia--on low cholesterol diet, compliant with pravastatin, denies side effects, myalgias.  GERD--doing well with daily omeprazole.  Only occasionally requires a Tums.   Past Medical History  Diagnosis Date  .  Hypertension   . Hyperlipidemia   . Arthritis     OA spine, hands  . DDD (degenerative disc disease), lumbar   . Adenomatous colon polyp 1/03  . Abnormal brain MRI 8/01    small hemorrhagic stroke and possible cavernous hemangioma (Dr. Erling Cruz)  . Osteopenia     (DEXA's done by Dr. Truddie Coco); prev took Fosamax.  No change in DEXA after off Fosamax x 2 years  . Postmenopausal bleeding 2000    benign EMB (Dr. Raphael Gibney)  . GERD (gastroesophageal reflux disease)   . Herpes zoster 2003  . Onychomycosis 2003, 2008    treated with Lamisil  . Breast cancer 2001    R breast (T3N1, ER/PR+, HER-2 +) s/p mastectomy, chemo and chest wall irradiation (Dr. Truddie Coco)  . Urge urinary incontinence   . Ovarian cyst 12/2008    right   Past Surgical History  Procedure Laterality Date  . Tubal ligation    . Tonsillectomy    . Modified mastectomy and tram flap reconstruction  2001    R breast  . Tram flap removal      problems with flap after radiation  . Cholecystectomy, laparoscopic  1/05  . Colonoscopy w/ polypectomy  05/23/01    adenomatous polyp  . Colonoscopy  12/17/08    normal (Dr. Wynetta Emery)  . Cataract extraction  summer 2011    bilateral  . Hearing loss      hearing aids 2012   History   Social History  . Marital Status: Married  Spouse Name: N/A    Number of Children: 3  . Years of Education: N/A   Occupational History  . Not on file.   Social History Main Topics  . Smoking status: Never Smoker   . Smokeless tobacco: Never Used  . Alcohol Use: No  . Drug Use: No  . Sexual Activity: Not Currently     Comment: due to poor libido   Other Topics Concern  . Not on file   Social History Narrative   Lives with husband, cat.  1 daughter at [facility] [place]--hospitalist; daughter in [place], son in [place]   Outpatient Encounter Prescriptions as of 10/30/2013  Medication Sig Note  . aspirin 81 MG tablet Take 81 mg by mouth daily.     . Calcium Carbonate-Vitamin D (CALTRATE 600+D) 600-400 MG-UNIT  per tablet Take 2 tablets by mouth daily.     . celecoxib (CELEBREX) 200 MG capsule Take 1 capsule (200 mg total) by mouth daily.   . Multiple Vitamins-Minerals (CENTRUM SILVER PO) Take 1 tablet by mouth daily.     Marland Kitchen omeprazole (PRILOSEC) 20 MG capsule Take 20 mg by mouth as needed.     . pravastatin (PRAVACHOL) 20 MG tablet Take 1 tablet (20 mg total) by mouth daily.   Marland Kitchen tolterodine (DETROL LA) 2 MG 24 hr capsule Take 1 capsule (2 mg total) by mouth daily.   . [DISCONTINUED] celecoxib (CELEBREX) 200 MG capsule Take 1 capsule (200 mg total) by mouth daily.   . [DISCONTINUED] lisinopril-hydrochlorothiazide (PRINZIDE,ZESTORETIC) 20-12.5 MG per tablet Take 2 tablets by mouth daily. 10/30/2013: gout  . [DISCONTINUED] pravastatin (PRAVACHOL) 20 MG tablet Take 1 tablet (20 mg total) by mouth daily.   Marland Kitchen acetaminophen (TYLENOL) 500 MG tablet Take 500 mg by mouth every 6 (six) hours as needed. 10/30/2013: Uses only prn headache  . calcium carbonate (TUMS - DOSED IN MG ELEMENTAL CALCIUM) 500 MG chewable tablet Chew 1 tablet by mouth as needed.   10/30/2013: Only prn heartburn (rarely needs, because Prilosec controls reflux)  . colchicine 0.6 MG tablet Take 2 tablets at onset of pain, then 1 tablet 1 hour later if needed. May then take twice daily if needed 10/30/2013: Hasn't taken yet with new flare; takes prn gout  . lisinopril (PRINIVIL,ZESTRIL) 40 MG tablet Take 1 tablet (40 mg total) by mouth daily.   . [DISCONTINUED] amoxicillin-clavulanate (AUGMENTIN) 875-125 MG per tablet Take 1 tablet by mouth 2 (two) times daily.    Allergies  Allergen Reactions  . Cephalexin Itching  . Ciprofloxacin Itching  . Diclofenac Nausea Only    Upset stomach  . Shellfish-Derived Products Nausea And Vomiting   ROS:  Denies fevers, chills, headaches, dizziness, chest pain, shortness of breath, URI symptoms, nausea, vomiting, dysphagia, bowel changes, dysuria, bleeding, bruising, rash, depression or other concerns except as  noted in HPI  PHYSICAL EXAM: BP 132/80  Pulse 80  Ht _0  (1.549 m)  Wt 160 lb (72.576 kg)  BMI 30.25 kg/m2 Pleasant, talkative female in no distress Neck: no lymphadenopathy, thyromegaly or bruit Heart: regular rate and rhythm, no murmur Lungs: clear bilaterally Back: no CVA tenderness Abdomen: soft, nontender, no organomegaly or mass Extremities: Trace pretibial edema bilaterally.  Erythema, warmth and tenderness of R>L 1st MTP's.  Some pain with movement R 3rd and 4th toes have some very mild erythema, peeled skin; nontender to palpation or movement of toes.  Neuro: alert and oriented. Normal strength, gait Psych: normal mood, affect, hygiene and grooming  ASSESSMENT/PLAN:  Gout of big toe - d/c the HCTZ from BP meds.  Use celebrex BID temporarily with flare, and restart colchicine prn. recheck uric acid prior to f/u - Plan: Uric acid, Sedimentation Rate  Essential hypertension, benign - well controlled, but HCTZ likely contributing to elevated uric acid levels and gout flares.  Stop HCTZ, and monitor BP.  May need to adjust meds if BP raises - Plan: lisinopril (PRINIVIL,ZESTRIL) 40 MG tablet  Pure hypercholesterolemia - due for labs, nonfasting today.  Do fasting labs prior to next visit - Plan: Lipid panel, pravastatin (PRAVACHOL) 20 MG tablet  Primary osteoarthritis involving multiple joints - controlled with Celebrex once daily - Plan: Comprehensive metabolic panel, Sedimentation Rate, celecoxib (CELEBREX) 200 MG capsule  Encounter for long-term (current) use of other medications - Plan: Lipid panel, Comprehensive metabolic panel, Uric acid, Sedimentation Rate  Urge incontinence of urine - improved with detrol   Change from lisinopril HCTZ to plain lisinopril, hoping that uric acid levels will improve and no further gout flares (to avoid needing preventative gout meds such as allopurinol, if kidney function is okay, vs uloric if elevated Cr).  Continue to monitor BP, and  made need to make med changes if inadequate control of BP with lisinopril alone. Patient is nonfasting today.  Therefore no labs done. She will return fasting in 4 weeks, with OV after  Uric acid, ESR, c-met, lipid  Low sodium diet Low purine diet

## 2013-11-20 ENCOUNTER — Other Ambulatory Visit: Payer: Self-pay | Admitting: Family Medicine

## 2013-11-21 ENCOUNTER — Other Ambulatory Visit: Payer: Medicare HMO

## 2013-11-21 DIAGNOSIS — M150 Primary generalized (osteo)arthritis: Secondary | ICD-10-CM

## 2013-11-21 DIAGNOSIS — M159 Polyosteoarthritis, unspecified: Secondary | ICD-10-CM

## 2013-11-21 DIAGNOSIS — Z79899 Other long term (current) drug therapy: Secondary | ICD-10-CM

## 2013-11-21 DIAGNOSIS — M109 Gout, unspecified: Secondary | ICD-10-CM

## 2013-11-21 DIAGNOSIS — E7800 Pure hypercholesterolemia, unspecified: Secondary | ICD-10-CM

## 2013-11-21 DIAGNOSIS — M8949 Other hypertrophic osteoarthropathy, multiple sites: Secondary | ICD-10-CM

## 2013-11-21 LAB — COMPREHENSIVE METABOLIC PANEL
ALT: 18 U/L (ref 0–35)
AST: 20 U/L (ref 0–37)
Albumin: 3.7 g/dL (ref 3.5–5.2)
Alkaline Phosphatase: 75 U/L (ref 39–117)
BUN: 26 mg/dL — ABNORMAL HIGH (ref 6–23)
CO2: 27 mEq/L (ref 19–32)
Calcium: 9.5 mg/dL (ref 8.4–10.5)
Chloride: 105 mEq/L (ref 96–112)
Creat: 1.24 mg/dL — ABNORMAL HIGH (ref 0.50–1.10)
Glucose, Bld: 92 mg/dL (ref 70–99)
Potassium: 4.2 mEq/L (ref 3.5–5.3)
Sodium: 141 mEq/L (ref 135–145)
Total Bilirubin: 0.6 mg/dL (ref 0.2–1.2)
Total Protein: 5.8 g/dL — ABNORMAL LOW (ref 6.0–8.3)

## 2013-11-21 LAB — URIC ACID: Uric Acid, Serum: 7.9 mg/dL — ABNORMAL HIGH (ref 2.4–7.0)

## 2013-11-21 LAB — LIPID PANEL
Cholesterol: 151 mg/dL (ref 0–200)
HDL: 41 mg/dL (ref >39)
LDL Cholesterol: 76 mg/dL (ref 0–99)
Total CHOL/HDL Ratio: 3.7 Ratio
Triglycerides: 171 mg/dL — ABNORMAL HIGH (ref <150)
VLDL: 34 mg/dL (ref 0–40)

## 2013-11-22 LAB — SEDIMENTATION RATE: Sed Rate: 9 mm/hr (ref 0–22)

## 2013-11-23 ENCOUNTER — Encounter: Payer: Self-pay | Admitting: Family Medicine

## 2013-11-23 ENCOUNTER — Ambulatory Visit (INDEPENDENT_AMBULATORY_CARE_PROVIDER_SITE_OTHER): Payer: Medicare HMO | Admitting: Family Medicine

## 2013-11-23 VITALS — BP 126/82 | HR 96 | Ht 60.25 in | Wt 161.0 lb

## 2013-11-23 DIAGNOSIS — M79674 Pain in right toe(s): Secondary | ICD-10-CM

## 2013-11-23 DIAGNOSIS — I10 Essential (primary) hypertension: Secondary | ICD-10-CM

## 2013-11-23 DIAGNOSIS — E7800 Pure hypercholesterolemia, unspecified: Secondary | ICD-10-CM

## 2013-11-23 DIAGNOSIS — M109 Gout, unspecified: Secondary | ICD-10-CM

## 2013-11-23 DIAGNOSIS — Z79899 Other long term (current) drug therapy: Secondary | ICD-10-CM

## 2013-11-23 DIAGNOSIS — M79609 Pain in unspecified limb: Secondary | ICD-10-CM

## 2013-11-23 MED ORDER — ALLOPURINOL 100 MG PO TABS: 100.0000 mg | ORAL_TABLET | Freq: Every day | ORAL | Status: DC

## 2013-11-23 MED ORDER — COLCHICINE 0.6 MG PO TABS: ORAL_TABLET | ORAL | Status: DC

## 2013-11-23 NOTE — Progress Notes (Signed)
Chief Complaint  Patient presents with  . consult    discuss lab results. had labs on tuesday.    Patient presents for follow up on hypertension and gout, as well as her fasting labs.  The HCTZ was removed from her regimen to help lower uric acid levels, given her recent problems with gout in her toes.  BP's at home are ranging 105-150's/64-76, mostly 120's/60's.  She denies any swelling in her feet since stopping the diuretic.  No further gout pain since her last visit.  She finished out the colcrys prescription (taking 1/day until gone).  She sometimes feels like she has a slight discomfort coming back in the toes, but wiggles it and it seems to be okay.    Hyperlipidemia:  She had fasting labs prior to her visit.  She hasn't tolerated fish oil in the past.  She doesn't fry at home, but does sometimes order fried food when out.  Past Medical History  Diagnosis Date  . Hypertension   . Hyperlipidemia   . Arthritis     OA spine, hands  . DDD (degenerative disc disease), lumbar   . Adenomatous colon polyp 1/03  . Abnormal brain MRI 8/01    small hemorrhagic stroke and possible cavernous hemangioma (Dr. Erling Cruz)  . Osteopenia     (DEXA's done by Dr. Truddie Coco); prev took Fosamax.  No change in DEXA after off Fosamax x 2 years  . Postmenopausal bleeding 2000    benign EMB (Dr. Raphael Gibney)  . GERD (gastroesophageal reflux disease)   . Herpes zoster 2003  . Onychomycosis 2003, 2008    treated with Lamisil  . Breast cancer 2001    R breast (T3N1, ER/PR+, HER-2 +) s/p mastectomy, chemo and chest wall irradiation (Dr. Truddie Coco)  . Urge urinary incontinence   . Ovarian cyst 12/2008    right   Past Surgical History  Procedure Laterality Date  . Tubal ligation    . Tonsillectomy    . Modified mastectomy and tram flap reconstruction  2001    R breast  . Tram flap removal      problems with flap after radiation  . Cholecystectomy, laparoscopic  1/05  . Colonoscopy w/ polypectomy  05/23/01   adenomatous polyp  . Colonoscopy  12/17/08    normal (Dr. Wynetta Emery)  . Cataract extraction  summer 2011    bilateral  . Hearing loss      hearing aids 2012   History   Social History  . Marital Status: Married    Spouse Name: N/A    Number of Children: 3  . Years of Education: N/A   Occupational History  . Not on file.   Social History Main Topics  . Smoking status: Never Smoker   . Smokeless tobacco: Never Used  . Alcohol Use: No  . Drug Use: No  . Sexual Activity: Not Currently     Comment: due to poor libido   Other Topics Concern  . Not on file   Social History Narrative   Lives with husband, cat.  1 daughter at [employer]; daughter in [place], son in [place]   Current Outpatient Prescriptions on File Prior to Visit  Medication Sig Dispense Refill  . aspirin 81 MG tablet Take 81 mg by mouth daily.        . Calcium Carbonate-Vitamin D (CALTRATE 600+D) 600-400 MG-UNIT per tablet Take 2 tablets by mouth daily. Takes one in the am everyday and Monday,wednesday and Friday take one at night also      .  celecoxib (CELEBREX) 200 MG capsule Take 1 capsule (200 mg total) by mouth daily.  90 capsule  1  . lisinopril (PRINIVIL,ZESTRIL) 40 MG tablet Take 1 tablet (40 mg total) by mouth daily.  90 tablet  0  . Multiple Vitamins-Minerals (CENTRUM SILVER PO) Take 1 tablet by mouth daily.        Marland Kitchen omeprazole (PRILOSEC) 20 MG capsule Take 20 mg by mouth as needed.        . pravastatin (PRAVACHOL) 20 MG tablet Take 1 tablet (20 mg total) by mouth daily.  90 tablet  1  . acetaminophen (TYLENOL) 500 MG tablet Take 500 mg by mouth every 6 (six) hours as needed.      . calcium carbonate (TUMS - DOSED IN MG ELEMENTAL CALCIUM) 500 MG chewable tablet Chew 1 tablet by mouth as needed.        . tolterodine (DETROL LA) 2 MG 24 hr capsule Take 1 capsule (2 mg total) by mouth daily.  90 capsule  1  . [DISCONTINUED] solifenacin (VESICARE) 5 MG tablet Take 1 tablet (5 mg total) by mouth daily.  90  tablet  0   No current facility-administered medications on file prior to visit.   Allergies  Allergen Reactions  . Cephalexin Itching  . Ciprofloxacin Itching  . Diclofenac Nausea Only    Upset stomach  . Shellfish-Derived Products Nausea And Vomiting   ROS:  No fevers, chills, URI symptoms, nausea, vomiting, bowel changes, headaches, dizziness, chest pain, edema.  No further toe pain/gout.  Arthritis pain stable/unchanged, controlled with celebrex.  PHYSICAL EXAM: BP 126/82  Pulse 96  Ht 5' 0.25" (1.53 m)  Wt 161 lb (73.029 kg)  BMI 31.20 kg/m2 Pleasant elderly female in no distress Neck: no lymphadenopathy or mass Heart: regular rate and rhythm Lungs: clear bilaterally Abdomen: soft, nontender Extremities: no edema.  Toes with inflammation, redness, swelling or warmth Skin: no rash Psych: normal mood, affect, hygiene and grooming    Chemistry      Component Value Date/Time   NA 141 11/21/2013 0907   NA 141 11/11/2012 1020   K 4.2 11/21/2013 0907   K 4.1 11/11/2012 1020   CL 105 11/21/2013 0907   CO2 27 11/21/2013 0907   CO2 26 11/11/2012 1020   BUN 26* 11/21/2013 0907   BUN 31.6* 11/11/2012 1020   CREATININE 1.24* 11/21/2013 0907   CREATININE 1.5* 11/11/2012 1020   CREATININE 1.43* 11/12/2011 1405      Component Value Date/Time   CALCIUM 9.5 11/21/2013 0907   CALCIUM 10.1 11/11/2012 1020   ALKPHOS 75 11/21/2013 0907   ALKPHOS 78 11/11/2012 1020   AST 20 11/21/2013 0907   AST 19 11/11/2012 1020   ALT 18 11/21/2013 0907   ALT 17 11/11/2012 1020   BILITOT 0.6 11/21/2013 0907   BILITOT 0.43 11/11/2012 1020     Glucose 92  Lab Results  Component Value Date   CHOL 151 11/21/2013   HDL 41 11/21/2013   LDLCALC 76 11/21/2013   TRIG 171* 11/21/2013   CHOLHDL 3.7 11/21/2013   Uric acid 7.9 ESR 9  ASSESSMENT/PLAN:  Essential hypertension, benign - well controlled without the HCTZ  Pure hypercholesterolemia - TG mildly elevated.  Reviewed diet in detail--cut out fried foods,  sweets.  Recommended fish oil. LDL at goal  Gout of big toe - Uric acid remains elevated, despite being off the HCTZ. borderline kidney function (fluctuates), so start low with allopurinol - Plan: colchicine 0.6 MG tablet, allopurinol (  ZYLOPRIM) 100 MG tablet, Uric acid  Pain of toe of right foot  Encounter for long-term (current) use of other medications - Plan: Uric acid, Creatinine, serum   Uric acid remains elevated, despite being off the HCTZ.  Therefore, recommend starting preventative medication with uric acid lowering med. She has borderline kidney function, but likely will tolerate a low dose allopurinol, and gradually titrate up, if needed, rather than starting Uloric (due to cost).  May need to change to uloric in future if needing high doses of allopurinol to get uric acid to goal.  Hyperlipidemia--TG mildly elevated.  Reviewed diet in detail--cut out fried foods, sweets.  Recommended fish oil---she might consider trying a different brand of fish oil, at low dose (1040m daily)  OA--discussed using tylenol periodically, consider not using Celebrex daily, if able, just prn for more severe pain.  Encouraged fluid intake.  Monitor kidney function   Lab visit in 1 month--Cr and uric acid. Titrate up allopurinol at that time, if needed.   F/u 6 months--CPE/med check

## 2013-11-23 NOTE — Patient Instructions (Addendum)
Start allopurinol 1 tablet every day. Return in 1 month for labs (non fasting). Let us know when you return whether or not you are still having any discomfort/pain related to gout  Continue to monitor your blood pressure--it looks good.  Return in 6 months for regular check.

## 2013-11-29 ENCOUNTER — Other Ambulatory Visit: Payer: Self-pay | Admitting: Family Medicine

## 2013-11-29 ENCOUNTER — Other Ambulatory Visit: Payer: Self-pay | Admitting: *Deleted

## 2013-11-29 DIAGNOSIS — M109 Gout, unspecified: Secondary | ICD-10-CM

## 2013-11-29 MED ORDER — COLCHICINE 0.6 MG PO TABS: ORAL_TABLET | ORAL | Status: DC

## 2013-11-29 MED ORDER — ALLOPURINOL 100 MG PO TABS: 100.0000 mg | ORAL_TABLET | Freq: Every day | ORAL | Status: DC

## 2013-12-19 ENCOUNTER — Other Ambulatory Visit: Payer: Self-pay | Admitting: Physician Assistant

## 2013-12-25 ENCOUNTER — Other Ambulatory Visit: Payer: Medicare HMO

## 2013-12-25 DIAGNOSIS — E7800 Pure hypercholesterolemia, unspecified: Secondary | ICD-10-CM

## 2013-12-25 DIAGNOSIS — Z79899 Other long term (current) drug therapy: Secondary | ICD-10-CM

## 2013-12-25 DIAGNOSIS — Z0000 Encounter for general adult medical examination without abnormal findings: Secondary | ICD-10-CM

## 2013-12-25 DIAGNOSIS — M109 Gout, unspecified: Secondary | ICD-10-CM

## 2013-12-25 LAB — CREATININE, SERUM: Creat: 1.29 mg/dL — ABNORMAL HIGH (ref 0.50–1.10)

## 2013-12-25 LAB — URIC ACID: Uric Acid, Serum: 6.5 mg/dL (ref 2.4–7.0)

## 2013-12-27 ENCOUNTER — Other Ambulatory Visit: Payer: Self-pay | Admitting: *Deleted

## 2013-12-27 DIAGNOSIS — M109 Gout, unspecified: Secondary | ICD-10-CM

## 2013-12-27 MED ORDER — ALLOPURINOL 100 MG PO TABS: 100.0000 mg | ORAL_TABLET | Freq: Every day | ORAL | Status: DC

## 2013-12-28 ENCOUNTER — Encounter: Payer: Self-pay | Admitting: Family Medicine

## 2013-12-28 ENCOUNTER — Ambulatory Visit (INDEPENDENT_AMBULATORY_CARE_PROVIDER_SITE_OTHER): Payer: Medicare HMO | Admitting: Family Medicine

## 2013-12-28 VITALS — BP 162/82 | HR 96 | Ht 60.25 in | Wt 160.0 lb

## 2013-12-28 DIAGNOSIS — M109 Gout, unspecified: Secondary | ICD-10-CM

## 2013-12-28 DIAGNOSIS — R609 Edema, unspecified: Secondary | ICD-10-CM

## 2013-12-28 DIAGNOSIS — I10 Essential (primary) hypertension: Secondary | ICD-10-CM

## 2013-12-28 MED ORDER — FUROSEMIDE 20 MG PO TABS: 10.0000 mg | ORAL_TABLET | Freq: Every day | ORAL | Status: DC | PRN

## 2013-12-28 NOTE — Progress Notes (Signed)
Chief Complaint  Patient presents with  . Edema    has been experiencing foot and ankle swelling since being off diuretic. Starts in the morning after she gets up and walked around, worsens by evening.    She has no swelling when she wakes up, but throughout the day she notes some swelling in her ankles, causing her to have to loosen her sandals.  She has noticed this since her BP meds were changed to get rid of the HCTZ which was likely contributing to her gout.  She has been trying to cut back on the salt in her diet. She has not had any further gout flares.  She just recently had labs, and the allopurinol dose was raised to 270m--she is getting refill from pharmacy today.  She hasn't been checking her BP's at home this week; she was rushed getting here today, was running late.  BP's had been 130's/70's.    Needs new rx for compression sleeve for RA for lymphedema--doesn't recall pressure of sleeve she is using.  Will call with info from box.   Past Medical History  Diagnosis Date  . Hypertension   . Hyperlipidemia   . Arthritis     OA spine, hands  . DDD (degenerative disc disease), lumbar   . Adenomatous colon polyp 1/03  . Abnormal brain MRI 8/01    small hemorrhagic stroke and possible cavernous hemangioma (Dr. LErling Cruz  . Osteopenia     (DEXA's done by Dr. RTruddie Coco; prev took Fosamax.  No change in DEXA after off Fosamax x 2 years  . Postmenopausal bleeding 2000    benign EMB (Dr. SRaphael Gibney  . GERD (gastroesophageal reflux disease)   . Herpes zoster 2003  . Onychomycosis 2003, 2008    treated with Lamisil  . Breast cancer 2001    R breast (T3N1, ER/PR+, HER-2 +) s/p mastectomy, chemo and chest wall irradiation (Dr. RTruddie Coco  . Urge urinary incontinence   . Ovarian cyst 12/2008    right   Past Surgical History  Procedure Laterality Date  . Tubal ligation    . Tonsillectomy    . Modified mastectomy and tram flap reconstruction  2001    R breast  . Tram flap removal     problems with flap after radiation  . Cholecystectomy, laparoscopic  1/05  . Colonoscopy w/ polypectomy  05/23/01    adenomatous polyp  . Colonoscopy  12/17/08    normal (Dr. JWynetta Emery  . Cataract extraction  summer 2011    bilateral  . Hearing loss      hearing aids 2012   History   Social History  . Marital Status: Married    Spouse Name: N/A    Number of Children: 3  . Years of Education: N/A   Occupational History  . Not on file.   Social History Main Topics  . Smoking status: Never Smoker   . Smokeless tobacco: Never Used  . Alcohol Use: No  . Drug Use: No  . Sexual Activity: Not Currently     Comment: due to poor libido   Other Topics Concern  . Not on file   Social History Narrative   Lives with husband, cat.  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 allopurinol (ZYLOPRIM) 100 MG tablet Take 200 mg by mouth daily.   .Marland Kitchenaspirin 81 MG tablet Take 81 mg by mouth daily.     .Marland Kitchen  Calcium Carbonate-Vitamin D (CALTRATE 600+D) 600-400 MG-UNIT per tablet Take 2 tablets by mouth daily. Takes one in the am everyday and Monday,wednesday and Friday take one at night also   . celecoxib (CELEBREX) 200 MG capsule Take 1 capsule (200 mg total) by mouth daily.   Marland Kitchen lisinopril (PRINIVIL,ZESTRIL) 40 MG tablet Take 1 tablet (40 mg total) by mouth daily.   . Multiple Vitamins-Minerals (CENTRUM SILVER PO) Take 1 tablet by mouth daily.     Marland Kitchen omeprazole (PRILOSEC) 20 MG capsule Take 20 mg by mouth as needed.     . pravastatin (PRAVACHOL) 20 MG tablet Take 1 tablet (20 mg total) by mouth daily.   Marland Kitchen tolterodine (DETROL LA) 2 MG 24 hr capsule Take 1 capsule (2 mg total) by mouth daily.   . [DISCONTINUED] allopurinol (ZYLOPRIM) 100 MG tablet Take 1 tablet (100 mg total) by mouth daily.   Marland Kitchen acetaminophen (TYLENOL) 500 MG tablet Take 500 mg by mouth every 6 (six) hours as needed. 10/30/2013: Uses  only prn headache  . calcium carbonate (TUMS - DOSED IN MG ELEMENTAL CALCIUM) 500 MG chewable tablet Chew 1 tablet by mouth as needed.   10/30/2013: Only prn heartburn (rarely needs, because Prilosec controls reflux)  . colchicine 0.6 MG tablet Take 2 tablets at onset of pain, then 1 tablet 1 hour later if needed. May then take twice daily if needed   . furosemide (LASIX) 20 MG tablet Take 0.5-1 tablets (10-20 mg total) by mouth daily as needed for fluid or edema.    (lasix just rx'd today)  Allergies  Allergen Reactions  . Cephalexin Itching  . Ciprofloxacin Itching  . Diclofenac Nausea Only    Upset stomach  . Shellfish-Derived Products Nausea And Vomiting   ROS:  Denies fevers, chills, URI symptoms, cough, shortness of breath, nausea, vomiting, bowel changes.  No gout flares.  No numbness/tingling.  No chest pain, palpitations.  +edema as per HPI. No pelvic or abdominal pain, no bleeding.  PHYSICAL EXAM: BP 162/82  Pulse 96  Ht 5' 0.25" (1.53 m)  Wt 160 lb (72.576 kg)  BMI 31.00 kg/m2 152/80 on repeat by MD Talkative female in no distress Neck: no lymphadenopathy or elevated JVP, no thyromegaly or mass Heart: regular rate and rhythm Lungs: clear bilaterally Extremities: trace pitting edema pretibially and at ankle Skin: intact without rashes, lesions Psych: normal mood, affect  ASSESSMENT/PLAN:  Peripheral edema - Ddx reviewed.  low sodium diet, leg elevation and compression stockings.  Lasix prn signifcant, persistent edema - Plan: furosemide (LASIX) 20 MG tablet  Gout of big toe - improved;  continue 261m allopurinol, for goal of uric acid <6 (was 6.5 on 1080mdose recently)  Essential hypertension, benign - elevated today, but rushing.  normal at home--continue to monitor.  return sooner than November if persistently elevated BP's   Low sodium diet. Keep legs elevated, when possible (with sitting--ie reading, watching TV). Compression stockings are recommended--put  them on first thing in the morning, and take them off at night.  Use lasix only as needed for significant swelling (ie can't put shoes on), not daily. You will need this less frequent if wearing the compression stockings and following a low sodium diet.   Pt to call with pressure for rx for arm sleeve for new rx.

## 2013-12-28 NOTE — Patient Instructions (Signed)
Keep legs elevated, when possible (with sitting--ie reading, watching TV). Compression stockings are recommended--put them on first thing in the morning, and take them off at night.  Use lasix only as needed for significant swelling (ie can't put shoes on), not daily. You will need this less frequent if wearing the compression stockings and following a low sodium diet.  Continue to monitor BP's regularly at home, to ensure that they are truly back to 130's/70's.  Low-Sodium Eating Plan Sodium raises blood pressure and causes water to be held in the body. Getting less sodium from food will help lower your blood pressure, reduce any swelling, and protect your heart, liver, and kidneys. We get sodium by adding salt (sodium chloride) to food. Most of our sodium comes from canned, boxed, and frozen foods. Restaurant foods, fast foods, and pizza are also very high in sodium. Even if you take medicine to lower your blood pressure or to reduce fluid in your body, getting less sodium from your food is important. WHAT IS MY PLAN? Most people should limit their sodium intake to 2,300 mg a day. Your health care provider recommends that you limit your sodium intake to __________ a day.  WHAT DO I NEED TO KNOW ABOUT THIS EATING PLAN? For the low-sodium eating plan, you will follow these general guidelines:  Choose foods with a % Daily Value for sodium of less than 5% (as listed on the food label).   Use salt-free seasonings or herbs instead of table salt or sea salt.   Check with your health care provider or pharmacist before using salt substitutes.   Eat fresh foods.  Eat more vegetables and fruits.  Limit canned vegetables. If you do use them, rinse them well to decrease the sodium.   Limit cheese to 1 oz (28 g) per day.   Eat lower-sodium products, often labeled as "lower sodium" or "no salt added."  Avoid foods that contain monosodium glutamate (MSG). MSG is sometimes added to [place] food  and some canned foods.  Check food labels (Nutrition Facts labels) on foods to learn how much sodium is in one serving.  Eat more home-cooked food and less restaurant, buffet, and fast food.  When eating at a restaurant, ask that your food be prepared with less salt or none, if possible.  HOW DO I READ FOOD LABELS FOR SODIUM INFORMATION? The Nutrition Facts label lists the amount of sodium in one serving of the food. If you eat more than one serving, you must multiply the listed amount of sodium by the number of servings. Food labels may also identify foods as:  Sodium free--Less than 5 mg in a serving.  Very low sodium--35 mg or less in a serving.  Low sodium--140 mg or less in a serving.  Light in sodium--50% less sodium in a serving. For example, if a food that usually has 300 mg of sodium is changed to become light in sodium, it will have 150 mg of sodium.  Reduced sodium--25% less sodium in a serving. For example, if a food that usually has 400 mg of sodium is changed to reduced sodium, it will have 300 mg of sodium. WHAT FOODS CAN I EAT? Grains Low-sodium cereals, including oats, puffed wheat and rice, and shredded wheat cereals. Low-sodium crackers. Unsalted rice and pasta. Lower-sodium bread.  Vegetables Frozen or fresh vegetables. Low-sodium or reduced-sodium canned vegetables. Low-sodium or reduced-sodium tomato sauce and paste. Low-sodium or reduced-sodium tomato and vegetable juices.  Fruits Fresh, frozen, and canned fruit.  Fruit juice.  Meat and Other Protein Products Low-sodium canned tuna and salmon. Fresh or frozen meat, poultry, seafood, and fish. Lamb. Unsalted nuts. Dried beans, peas, and lentils without added salt. Unsalted canned beans. Homemade soups without salt. Eggs.  Dairy Milk. Soy milk. Ricotta cheese. Low-sodium or reduced-sodium cheeses. Yogurt.  Condiments Fresh and dried herbs and spices. Salt-free seasonings. Onion and garlic powders.  Low-sodium varieties of mustard and ketchup. Lemon juice.  Fats and Oils Reduced-sodium salad dressings. Unsalted butter.  Other Unsalted popcorn and pretzels.  The items listed above may not be a complete list of recommended foods or beverages. Contact your dietitian for more options. WHAT FOODS ARE NOT RECOMMENDED? Grains Instant hot cereals. Bread stuffing, pancake, and biscuit mixes. Croutons. Seasoned rice or pasta mixes. Noodle soup cups. Boxed or frozen macaroni and cheese. Self-rising flour. Regular salted crackers. Vegetables Regular canned vegetables. Regular canned tomato sauce and paste. Regular tomato and vegetable juices. Frozen vegetables in sauces. Salted french fries. Olives. [name]. Relishes. Sauerkraut. Salsa. Meat and Other Protein Products Salted, canned, smoked, spiced, or pickled meats, seafood, or fish. Bacon, ham, sausage, hot dogs, corned beef, chipped beef, and packaged luncheon meats. Salt pork. Jerky. Pickled herring. Anchovies, regular canned tuna, and sardines. Salted nuts. Dairy Processed cheese and cheese spreads. Cheese curds. Blue cheese and cottage cheese. Buttermilk.  Condiments Onion and garlic salt, seasoned salt, table salt, and sea salt. Canned and packaged gravies. Worcestershire sauce. Tartar sauce. Barbecue sauce. Teriyaki sauce. Soy sauce, including reduced sodium. Steak sauce. Fish sauce. Oyster sauce. Cocktail sauce. Horseradish. Regular ketchup and mustard. Meat flavorings and tenderizers. Bouillon cubes. Hot sauce. Tabasco sauce. Marinades. Taco seasonings. Relishes. Fats and Oils Regular salad dressings. Salted butter. Margarine. Ghee. Bacon fat.  Other Potato and tortilla chips. Corn chips and puffs. Salted popcorn and pretzels. Canned or dried soups. Pizza. Frozen entrees and pot pies.  The items listed above may not be a complete list of foods and beverages to avoid. Contact your dietitian for more information. Document  Released: 10/10/2001 Document Revised: 04/25/2013 Document Reviewed: 02/22/2013 [facility] Patient Information 2015 [place], [place]. This information is not intended to replace advice given to you by your health care provider. Make sure you discuss any questions you have with your health care provider.

## 2014-01-03 ENCOUNTER — Other Ambulatory Visit: Payer: Self-pay | Admitting: Family Medicine

## 2014-01-18 ENCOUNTER — Other Ambulatory Visit: Payer: Self-pay | Admitting: Family Medicine

## 2014-01-29 ENCOUNTER — Other Ambulatory Visit: Payer: Self-pay | Admitting: Family Medicine

## 2014-03-05 ENCOUNTER — Encounter: Payer: Self-pay | Admitting: Family Medicine

## 2014-03-19 ENCOUNTER — Other Ambulatory Visit: Payer: Self-pay | Admitting: Family Medicine

## 2014-04-02 ENCOUNTER — Other Ambulatory Visit: Payer: Self-pay | Admitting: Family Medicine

## 2014-04-11 ENCOUNTER — Other Ambulatory Visit: Payer: Self-pay

## 2014-04-11 DIAGNOSIS — Z1231 Encounter for screening mammogram for malignant neoplasm of breast: Secondary | ICD-10-CM

## 2014-04-24 ENCOUNTER — Other Ambulatory Visit: Payer: Medicare HMO

## 2014-04-24 DIAGNOSIS — E7800 Pure hypercholesterolemia, unspecified: Secondary | ICD-10-CM

## 2014-04-24 DIAGNOSIS — Z79899 Other long term (current) drug therapy: Secondary | ICD-10-CM

## 2014-04-24 DIAGNOSIS — M109 Gout, unspecified: Secondary | ICD-10-CM

## 2014-04-24 DIAGNOSIS — Z0000 Encounter for general adult medical examination without abnormal findings: Secondary | ICD-10-CM

## 2014-04-24 LAB — COMPREHENSIVE METABOLIC PANEL
ALT: 22 U/L (ref 0–35)
AST: 25 U/L (ref 0–37)
Albumin: 3.9 g/dL (ref 3.5–5.2)
Alkaline Phosphatase: 68 U/L (ref 39–117)
BUN: 32 mg/dL — ABNORMAL HIGH (ref 6–23)
CO2: 28 mEq/L (ref 19–32)
Calcium: 9.6 mg/dL (ref 8.4–10.5)
Chloride: 106 mEq/L (ref 96–112)
Creat: 1.11 mg/dL — ABNORMAL HIGH (ref 0.50–1.10)
Glucose, Bld: 94 mg/dL (ref 70–99)
Potassium: 4.2 mEq/L (ref 3.5–5.3)
Sodium: 142 mEq/L (ref 135–145)
Total Bilirubin: 0.4 mg/dL (ref 0.2–1.2)
Total Protein: 5.8 g/dL — ABNORMAL LOW (ref 6.0–8.3)

## 2014-04-24 LAB — LIPID PANEL
Cholesterol: 149 mg/dL (ref 0–200)
HDL: 42 mg/dL (ref >39)
LDL Cholesterol: 86 mg/dL (ref 0–99)
Total CHOL/HDL Ratio: 3.5 Ratio
Triglycerides: 106 mg/dL (ref <150)
VLDL: 21 mg/dL (ref 0–40)

## 2014-04-24 LAB — TSH: TSH: 4.423 u[IU]/mL (ref 0.350–4.500)

## 2014-04-24 LAB — URIC ACID: Uric Acid, Serum: 4.1 mg/dL (ref 2.4–7.0)

## 2014-04-25 ENCOUNTER — Ambulatory Visit
Admission: RE | Admit: 2014-04-25 | Discharge: 2014-04-25 | Disposition: A | Discharge status: Home / Self Care | Payer: Medicare HMO | Source: Ambulatory Visit

## 2014-04-25 DIAGNOSIS — Z1231 Encounter for screening mammogram for malignant neoplasm of breast: Secondary | ICD-10-CM

## 2014-04-27 ENCOUNTER — Other Ambulatory Visit: Payer: Self-pay | Admitting: Family Medicine

## 2014-04-30 ENCOUNTER — Other Ambulatory Visit: Payer: Self-pay | Admitting: Family Medicine

## 2014-05-02 ENCOUNTER — Encounter: Payer: Medicare HMO | Admitting: Family Medicine

## 2014-05-02 ENCOUNTER — Ambulatory Visit (INDEPENDENT_AMBULATORY_CARE_PROVIDER_SITE_OTHER): Payer: Medicare HMO | Admitting: Family Medicine

## 2014-05-02 ENCOUNTER — Encounter: Payer: Self-pay | Admitting: Family Medicine

## 2014-05-02 VITALS — BP 178/82 | HR 76 | Ht 61.0 in | Wt 161.0 lb

## 2014-05-02 DIAGNOSIS — Z0000 Encounter for general adult medical examination without abnormal findings: Secondary | ICD-10-CM

## 2014-05-02 DIAGNOSIS — K219 Gastro-esophageal reflux disease without esophagitis: Secondary | ICD-10-CM

## 2014-05-02 DIAGNOSIS — M159 Polyosteoarthritis, unspecified: Secondary | ICD-10-CM

## 2014-05-02 DIAGNOSIS — M109 Gout, unspecified: Secondary | ICD-10-CM

## 2014-05-02 DIAGNOSIS — M8949 Other hypertrophic osteoarthropathy, multiple sites: Secondary | ICD-10-CM

## 2014-05-02 DIAGNOSIS — E7800 Pure hypercholesterolemia, unspecified: Secondary | ICD-10-CM

## 2014-05-02 DIAGNOSIS — M150 Primary generalized (osteo)arthritis: Secondary | ICD-10-CM

## 2014-05-02 DIAGNOSIS — N3941 Urge incontinence: Secondary | ICD-10-CM

## 2014-05-02 DIAGNOSIS — M1000 Idiopathic gout, unspecified site: Secondary | ICD-10-CM

## 2014-05-02 DIAGNOSIS — I10 Essential (primary) hypertension: Secondary | ICD-10-CM

## 2014-05-02 LAB — POCT URINALYSIS DIPSTICK
Bilirubin, UA: NEGATIVE
Blood, UA: NEGATIVE
Glucose, UA: NEGATIVE
Ketones, UA: NEGATIVE
Leukocytes, UA: NEGATIVE
Nitrite, UA: NEGATIVE
Protein, UA: NEGATIVE
Spec Grav, UA: >=1.03
Urobilinogen, UA: NEGATIVE
pH, UA: 5.5

## 2014-05-02 MED ORDER — AMLODIPINE BESYLATE 5 MG PO TABS: ORAL_TABLET | ORAL | Status: DC

## 2014-05-02 MED ORDER — LISINOPRIL 40 MG PO TABS: ORAL_TABLET | ORAL | Status: DC

## 2014-05-02 NOTE — Patient Instructions (Signed)
HEALTH MAINTENANCE RECOMMENDATIONS:  It is recommended that you get at least 30 minutes of aerobic exercise at least 5 days/week (for weight loss, you may need as much as 60-90 minutes). This can be any activity that gets your heart rate up. This can be divided in 10-15 minute intervals if needed, but try and build up your endurance at least once a week.  Weight bearing exercise is also recommended twice weekly.  Eat a healthy diet with lots of vegetables, fruits and fiber.  "Colorful" foods have a lot of vitamins (ie green vegetables, tomatoes, red peppers, etc).  Limit sweet tea, regular sodas and alcoholic beverages, all of which has a lot of calories and sugar.  Up to 1 alcoholic drink daily may be beneficial for women (unless trying to lose weight, watch sugars).  Drink a lot of water.  Calcium recommendations are 1200-1500 mg daily (1500 mg for postmenopausal women or women without ovaries), and vitamin D 1000 IU daily.  This should be obtained from diet and/or supplements (vitamins), and calcium should not be taken all at once, but in divided doses.  Monthly self breast exams and yearly mammograms for women over the age of 39 is recommended.  Sunscreen of at least SPF 30 should be used on all sun-exposed parts of the skin when outside between the hours of 10 am and 4 pm (not just when at beach or pool, but even with exercise, golf, tennis, and yard work!)  Use a sunscreen that says "broad spectrum" so it covers both UVA and UVB rays, and make sure to reapply every 1-2 hours.  Remember to change the batteries in your smoke detectors when changing your clock times in the spring and fall.  Use your seat belt every time you are in a car, and please drive safely and not be distracted with cell phones and texting while driving.  Start amlodipine in addition to the lisinopril.  Start at 1/2 tablet (2.5mg  dose) for 2-3 weeks.  Increase to full tablet (5mg ) only if blood pressure is still  consistently over 979 systolic.  Try and limit the sodium in your diet, and get daily exercise.  Low-Sodium Eating Plan Sodium raises blood pressure and causes water to be held in the body. Getting less sodium from food will help lower your blood pressure, reduce any swelling, and protect your heart, liver, and kidneys. We get sodium by adding salt (sodium chloride) to food. Most of our sodium comes from canned, boxed, and frozen foods. Restaurant foods, fast foods, and pizza are also very high in sodium. Even if you take medicine to lower your blood pressure or to reduce fluid in your body, getting less sodium from your food is important. WHAT IS MY PLAN? Most people should limit their sodium intake to 2,300 mg a day. Your health care provider recommends that you limit your sodium intake to __________ a day.  WHAT DO I NEED TO KNOW ABOUT THIS EATING PLAN? For the low-sodium eating plan, you will follow these general guidelines:  Choose foods with a % Daily Value for sodium of less than 5% (as listed on the food label).   Use salt-free seasonings or herbs instead of table salt or sea salt.   Check with your health care provider or pharmacist before using salt substitutes.   Eat fresh foods.  Eat more vegetables and fruits.  Limit canned vegetables. If you do use them, rinse them well to decrease the sodium.   Limit cheese to 1  oz (28 g) per day.   Eat lower-sodium products, often labeled as "lower sodium" or "no salt added."  Avoid foods that contain monosodium glutamate (MSG). MSG is sometimes added to [place] food and some canned foods.  Check food labels (Nutrition Facts labels) on foods to learn how much sodium is in one serving.  Eat more home-cooked food and less restaurant, buffet, and fast food.  When eating at a restaurant, ask that your food be prepared with less salt or none, if possible.  HOW DO I READ FOOD LABELS FOR SODIUM INFORMATION? The Nutrition Facts  label lists the amount of sodium in one serving of the food. If you eat more than one serving, you must multiply the listed amount of sodium by the number of servings. Food labels may also identify foods as:  Sodium free--Less than 5 mg in a serving.  Very low sodium--35 mg or less in a serving.  Low sodium--140 mg or less in a serving.  Light in sodium--50% less sodium in a serving. For example, if a food that usually has 300 mg of sodium is changed to become light in sodium, it will have 150 mg of sodium.  Reduced sodium--25% less sodium in a serving. For example, if a food that usually has 400 mg of sodium is changed to reduced sodium, it will have 300 mg of sodium. WHAT FOODS CAN I EAT? Grains Low-sodium cereals, including oats, puffed wheat and rice, and shredded wheat cereals. Low-sodium crackers. Unsalted rice and pasta. Lower-sodium bread.  Vegetables Frozen or fresh vegetables. Low-sodium or reduced-sodium canned vegetables. Low-sodium or reduced-sodium tomato sauce and paste. Low-sodium or reduced-sodium tomato and vegetable juices.  Fruits Fresh, frozen, and canned fruit. Fruit juice.  Meat and Other Protein Products Low-sodium canned tuna and salmon. Fresh or frozen meat, poultry, seafood, and fish. Lamb. Unsalted nuts. Dried beans, peas, and lentils without added salt. Unsalted canned beans. Homemade soups without salt. Eggs.  Dairy Milk. Soy milk. Ricotta cheese. Low-sodium or reduced-sodium cheeses. Yogurt.  Condiments Fresh and dried herbs and spices. Salt-free seasonings. Onion and garlic powders. Low-sodium varieties of mustard and ketchup. Lemon juice.  Fats and Oils Reduced-sodium salad dressings. Unsalted butter.  Other Unsalted popcorn and pretzels.  The items listed above may not be a complete list of recommended foods or beverages. Contact your dietitian for more options. WHAT FOODS ARE NOT RECOMMENDED? Grains Instant hot cereals. Bread stuffing,  pancake, and biscuit mixes. Croutons. Seasoned rice or pasta mixes. Noodle soup cups. Boxed or frozen macaroni and cheese. Self-rising flour. Regular salted crackers. Vegetables Regular canned vegetables. Regular canned tomato sauce and paste. Regular tomato and vegetable juices. Frozen vegetables in sauces. Salted french fries. Olives. Angie Fava. Relishes. Sauerkraut. Salsa. Meat and Other Protein Products Salted, canned, smoked, spiced, or pickled meats, seafood, or fish. Bacon, ham, sausage, hot dogs, corned beef, chipped beef, and packaged luncheon meats. Salt pork. Jerky. Pickled herring. Anchovies, regular canned tuna, and sardines. Salted nuts. Dairy Processed cheese and cheese spreads. Cheese curds. Blue cheese and cottage cheese. Buttermilk.  Condiments Onion and garlic salt, seasoned salt, table salt, and sea salt. Canned and packaged gravies. Worcestershire sauce. Tartar sauce. Barbecue sauce. Teriyaki sauce. Soy sauce, including reduced sodium. Steak sauce. Fish sauce. Oyster sauce. Cocktail sauce. Horseradish. Regular ketchup and mustard. Meat flavorings and tenderizers. Bouillon cubes. Hot sauce. Tabasco sauce. Marinades. Taco seasonings. Relishes. Fats and Oils Regular salad dressings. Salted butter. Margarine. Ghee. Bacon fat.  Other Potato and tortilla chips. Corn chips and  puffs. Salted popcorn and pretzels. Canned or dried soups. Pizza. Frozen entrees and pot pies.  The items listed above may not be a complete list of foods and beverages to avoid. Contact your dietitian for more information. Document Released: 10/10/2001 Document Revised: 04/25/2013 Document Reviewed: 02/22/2013 [facility] Patient Information 2015 [place], [place]. This information is not intended to replace advice given to you by your health care provider. Make sure you discuss any questions you have with your health care provider.

## 2014-05-02 NOTE — Progress Notes (Signed)
Chief Complaint  Patient presents with  . Annual Exam    nonfasting annual exam with pap/pelvic.(labs already done). Did not do eye exam, she had one recently. Is having elevated bp readings. Has newpapaer clipping that she would like to discuss with you [id number] absorbtion. And needs to discuss travel meds.    Connie [name] is a 80 y.o. female who presents for a complete physical and med check. Her main concern is that her blood pressure has been running high. She leaves Monday for a more than month-long trip.  Gout:  Dose of allopurinol was raised to 277m a few months ago.  She hasn't had any recurrent gout of her great toe.  She has some redness, which she believes is related to bunions--not having the severe pain or swelling she did in the past.  Edema--she was seen over the summer with swelling, after HCTZ had been stopped due to contributing to gout.  She hasn't had any recurrent swelling in her feet or ankles, not needed to take any furosemide.  Hypertension follow-up: BP at home has been running high.  There was one value of 129/71, but mostly is running  140's-170's/70-95 (mostly 70-86). Denies headaches, dizziness, chest pain, palpitations, edema.  She is compliant with medication.  Denies any change in salt intake. Using more frozen foods rather than canned, but still has some canned foods (green beans).  She does eat some soup when out.  She likes ham, lunch meats, tries to limit her bacon.  Hyperlipidemia: She had fasting labs prior to her visit. She hasn't tolerated fish oil in the past. She doesn't fry at home, but does sometimes order fried food when out, less than once a week.  She tries to follow a lowfat, low cholesterol diet.  She had more fried fish than usual recently, as she was at the beach. She is compliant with her medications and denies any side effects.  Arthritis--doing well with Celebrex once daily. Pain is in back, feet and hands, and medication is  effective at reducing pain. Celebrex works very well for her.  She hasn't had a flare of swelling in her fingers in quite a while. Previously tried tylenol without adequate pain relief. Denies any GI side effects, takes with her breakfast.   Urge urinary incontinence--She is taking Detrol, which was more effective than Vesicare for a while, but not as good as it was initially. Denies any significant side effects. Denies any dysuria, odor or hematuria. She still gets up 2-3 times/night; daytime urgency/frequency has improved. Sometimes has leaking when going to the bathroom in the middle of the night, and the urgency is a little worse than it was at last visit.  GERD--doing well with daily omeprazole. Only occasionally requires a Tums. She is wondering about potential decrease in calcium absorption, worries about her bones.  Osteopenia/low bone mass--last DEXA was 12/2011.  She previously took Fosamax (for 10 years, per pt).  She stopped it prior to dental work, about 4-5 years ago.  She doesn't currently get any weight-bearing exercise.  She takes calcium +D--see med list.  She went to the travel clinic.  She was given z-pak to use just in case of illness, as well as a malaria prophylaxis medication.  She plans to take Bonine as well, which she has taken in the past without problems.  [place]  Immunization History  Administered Date(s) Administered  . Influenza Split  02/02/2011, 02/02/2012, 01/19/2013  . Influenza-Unspecified 01/16/2014  . Pneumococcal Conjugate-13 05/01/2013  . Pneumococcal Polysaccharide-23 06/04/2005  . Tdap 06/04/2005  . Zoster 06/04/2005   Last Pap smear: 6/09  Last mammogram: 04/25/14 Last colonoscopy: 12/2008  Last DEXA: per Dr. Truddie Coco, 12/2011  Dentist: regular  Ophtho: yearly (in the last few months) Exercise:  She walks when it isn't raining  Past Medical History  Diagnosis Date  .  Hypertension   . Hyperlipidemia   . Arthritis     OA spine, hands  . DDD (degenerative disc disease), lumbar   . Adenomatous colon polyp 1/03  . Abnormal brain MRI 8/01    small hemorrhagic stroke and possible cavernous hemangioma (Dr. Erling Cruz)  . Osteopenia     (DEXA's done by Dr. Truddie Coco); prev took Fosamax.  No change in DEXA after off Fosamax x 2 years  . Postmenopausal bleeding 2000    benign EMB (Dr. Raphael Gibney)  . GERD (gastroesophageal reflux disease)   . Herpes zoster 2003  . Onychomycosis 2003, 2008    treated with Lamisil  . Breast cancer 2001    R breast (T3N1, ER/PR+, HER-2 +) s/p mastectomy, chemo and chest wall irradiation (Dr. Truddie Coco)  . Urge urinary incontinence   . Ovarian cyst 12/2008    right    Past Surgical History  Procedure Laterality Date  . Tubal ligation    . Tonsillectomy    . Modified mastectomy and tram flap reconstruction  2001    R breast  . Tram flap removal      problems with flap after radiation  . Cholecystectomy, laparoscopic  1/05  . Colonoscopy w/ polypectomy  05/23/01    adenomatous polyp  . Colonoscopy  12/17/08    normal (Dr. Wynetta Emery)  . Cataract extraction  summer 2011    bilateral  . Hearing loss      hearing aids 2012    History   Social History  . Marital Status: Married    Spouse Name: N/A    Number of Children: 3  . Years of Education: N/A   Occupational History  . Not on file.   Social History Main Topics  . Smoking status: Never Smoker   . Smokeless tobacco: Never Used  . Alcohol Use: No  . Drug Use: No  . Sexual Activity: Not Currently     Comment: due to poor libido   Other Topics Concern  . Not on file   Social History Narrative   Lives with husband, cat.  1 daughter at [employer] [facility] [employer]--hospitalist; daughter in [place], son in [place]    Family History  Problem Relation Age of Onset  . Heart disease Mother   . Hypertension Mother   . Heart disease Father   . Hypertension Father   . Diabetes Brother   . Heart  disease Brother   . Drug abuse Neg Hx   . Cancer Neg Hx     Outpatient Encounter Prescriptions as of 05/02/2014  Medication Sig Note  . allopurinol (ZYLOPRIM) 100 MG tablet Take 200 mg by mouth daily.   Marland Kitchen aspirin 81 MG tablet Take 81 mg by mouth daily.     . Calcium Carbonate-Vitamin D (CALTRATE 600+D) 600-400 MG-UNIT per tablet Take 2 tablets by mouth daily. Takes one in the am everyday and Monday,wednesday and Friday take one at night also   . celecoxib (CELEBREX) 200 MG capsule Take 1 capsule (200 mg total) by mouth daily.   Marland Kitchen lisinopril (PRINIVIL,ZESTRIL) 40 MG tablet TAKE  1 TABLET (40 MG TOTAL) BY MOUTH DAILY.   . Multiple Vitamins-Minerals (CENTRUM SILVER PO) Take 1 tablet by mouth daily.     Marland Kitchen omeprazole (PRILOSEC) 20 MG capsule Take 20 mg by mouth as needed.     . pravastatin (PRAVACHOL) 20 MG tablet TAKE 1 TABLET EVERY DAY   . tolterodine (DETROL LA) 2 MG 24 hr capsule TAKE ONE CAPSULE BY MOUTH EVERY DAY   . [DISCONTINUED] lisinopril (PRINIVIL,ZESTRIL) 40 MG tablet TAKE 1 TABLET (40 MG TOTAL) BY MOUTH DAILY.   Marland Kitchen acetaminophen (TYLENOL) 500 MG tablet Take 500 mg by mouth every 6 (six) hours as needed. 10/30/2013: Uses only prn headache  . amLODipine (NORVASC) 5 MG tablet Take 1/2 tablet by mouth once daily.  Increase to full tablet after 2-3 weeks if systolic BP remains over 161   . calcium carbonate (TUMS - DOSED IN MG ELEMENTAL CALCIUM) 500 MG chewable tablet Chew 1 tablet by mouth as needed.   10/30/2013: Only prn heartburn (rarely needs, because Prilosec controls reflux)  . colchicine 0.6 MG tablet Take 2 tablets at onset of pain, then 1 tablet 1 hour later if needed. May then take twice daily if needed (Patient not taking: Reported on 05/02/2014)   . furosemide (LASIX) 20 MG tablet Take 0.5-1 tablets (10-20 mg total) by mouth daily as needed for fluid or edema. (Patient not taking: Reported on 05/02/2014)   . [DISCONTINUED] allopurinol (ZYLOPRIM) 100 MG tablet TAKE 2 TABLETS EVERY  DAY   . [DISCONTINUED] lisinopril (PRINIVIL,ZESTRIL) 40 MG tablet TAKE 1 TABLET (40 MG TOTAL) BY MOUTH DAILY.   . [DISCONTINUED] pravastatin (PRAVACHOL) 20 MG tablet TAKE 1 TABLET EVERY DAY     Allergies  Allergen Reactions  . Cephalexin Itching  . Ciprofloxacin Itching  . Diclofenac Nausea Only    Upset stomach  . Shellfish-Derived Products Nausea And Vomiting   ROS: The patient denies anorexia, fever, weight changes, headaches, vision changes, ear pain, sore throat, breast concerns, chest pain, dizziness, syncope, dyspnea on exertion, cough, swelling, nausea, vomiting, diarrhea, constipation, abdominal pain, melena, hematochezia, indigestion/heartburn, hematuria, dysuria, vaginal bleeding, discharge, odor or itch, genital lesions, weakness, tremor, suspicious skin lesions, depression, anxiety, abnormal bleeding/bruising, or enlarged lymph nodes.  ongoing numbness in fingers and toes since her chemo. +urge urinary incontinence (see HPI). +joint pains in hands/fingers/feet--relieved by Celebrex. +hearing loss--has hearing aids. Denies any thyroid symptoms.  PHYSICAL EXAM:  BP 182/90 mmHg  Pulse 76  Ht '5\' 1"'  (1.549 m)  Wt 161 lb (73.029 kg)  BMI 30.44 kg/m2 171/93 on pt's machine. 178/82 on repeat by MD, [place] General Appearance:  Alert, cooperative, no distress, appears stated age   Head:  Normocephalic, without obvious abnormality, atraumatic   Eyes:  PERRL, conjunctiva/corneas clear, EOM's intact, fundi  benign   Ears:  Wearing hearing aids. TM's and EAC's were normal  Nose:  Nares normal, mucosa normal, no drainage or sinus tenderness   Throat:  Lips, mucosa, and tongue normal; teeth and gums normal.   Neck:  Supple, no lymphadenopathy; thyroid: no enlargement/tenderness/nodules; no carotid  bruit or JVD   Back:  Spine nontender, no curvature, ROM normal, no CVA tenderness   Lungs:  Clear to auscultation bilaterally without wheezes, rales or ronchi; respirations  unlabored   Chest Wall:  No tenderness or deformity   Heart:  Regular rate and rhythm, S1 and S2 normal, no murmur, rub  or gallop. Occasional ectopic beats  Breast Exam:  R breast absent, well healed surgical scars, nontender.  L breast normal-- no masses, or nipple discharge. nipple is mildly inverted, chronic. No axillary lymphadenopathy   Abdomen:  Soft, non-tender, nondistended, normoactive bowel sounds,  no masses, no hepatosplenomegaly   Genitalia:  Normal external genitalia without lesions. BUS and vagina normal, mild atrophic changes; no cervical motion tenderness. No abnormal vaginal discharge. Uterus normal size, nontender, no masses. No adnexal masses or tenderness; exam is somewhat limited by body habitus. Pap not performed   Rectal:  Normal tone, no masses or tenderness; guaiac negative stool   Extremities:  No clubbing or cyanosis. Bunion deformities noted bilaterally, nontender. No edema.  Pulses:  2+ and symmetric all extremities   Skin:  Skin color, texture, turgor normal, no rashes or lesions. Many angiomas, one below left breast is minimally inflamed  Lymph nodes:  Cervical, supraclavicular, and axillary nodes normal   Neurologic:  CNII-XII intact, normal strength, sensation and gait; reflexes 2+ and symmetric throughout    Psych: Normal mood, affect, hygiene and grooming.   Urine dip normal.  Lab Results  Component Value Date   CHOL 149 04/24/2014   HDL 42 04/24/2014   LDLCALC 86 04/24/2014   TRIG 106 04/24/2014   CHOLHDL 3.5 04/24/2014     Chemistry      Component Value Date/Time   NA 142 04/24/2014 0832   NA 141 11/11/2012 1020   K 4.2 04/24/2014 0832   K 4.1 11/11/2012 1020   CL 106 04/24/2014 0832   CO2 28 04/24/2014 0832   CO2 26 11/11/2012 1020   BUN 32* 04/24/2014 0832   BUN 31.6* 11/11/2012 1020   CREATININE 1.11* 04/24/2014 0832   CREATININE 1.5* 11/11/2012 1020   CREATININE 1.43* 11/12/2011 1405       Component Value Date/Time   CALCIUM 9.6 04/24/2014 0832   CALCIUM 10.1 11/11/2012 1020   ALKPHOS 68 04/24/2014 0832   ALKPHOS 78 11/11/2012 1020   AST 25 04/24/2014 0832   AST 19 11/11/2012 1020   ALT 22 04/24/2014 0832   ALT 17 11/11/2012 1020   BILITOT 0.4 04/24/2014 0832   BILITOT 0.43 11/11/2012 1020     Fasting glucose 94  Uric acid 4.1  Lab Results  Component Value Date   TSH 4.423 04/24/2014   (was 3.2 last year, 4.2 two years ago)    ASSESSMENT/PLAN:  Annual physical exam - Plan: POCT Urinalysis Dipstick  Essential hypertension, benign - poorly controlled.  continue lisinopril and add amlodipine (start at 2.44m and increase to 544min 2-3 weeks if still above goal.).  Risks/side effects reviewed. - Plan: lisinopril (PRINIVIL,ZESTRIL) 40 MG tablet, amLODipine (NORVASC) 5 MG tablet  Pure hypercholesterolemia - at goal  Urge incontinence of urine - borderline control.  If worsens, consider change in med vs change in dose  Gout of big toe - no recurrence since on [id number] allopurinol  Primary osteoarthritis involving multiple joints - well controlled.  Risks of longterm celebrex reviewed.  consider cutting back to qod vs prn dosing, and also using tylenol prn  Gastroesophageal reflux disease, esophagitis presence not specified - risks of PPI vs untreated reflux reviewed in detail.  discussed prn use of med; reviewed diet/behavioral measures.  sx mostly nocturnal   Discussed monthly self breast exams and yearly mammograms; at least 30 minutes of aerobic activity at least 5 days/week; add weight-bearing exercise at least 2x/wk; proper sunscreen use reviewed; healthy diet, including goals of calcium and vitamin D intake and alcohol recommendations (less than or equal to 1 drink/day) reviewed;  regular seatbelt use; changing batteries in smoke detectors. Immunization recommendations discussed, UTD. Colonoscopy recommendations reviewed--UTD. Pap smear not indicated due  to age and low risk.  Discussed risks/side effects of both untreated reflux and of prolonged PPI.  Reviewed diet, as well as considering trial of Zantac instead of omeprazole, since her symptoms are mainly just at night.   Plan to repeat DEXA next year.  HTN--add amlodipine.  Given that she will be leaving for out of the country within the week, and not returning until the middle of February, she will take her monitor with her.  She will start 1/2 tablet and take for 2-3 weeks.  If BP's remain >071 systolic, then increase to full tablet.  Return here at the end of February with list of blood pressures.   She has a living will and a healthcare power of attorney. Full Code, Full Care.  F/u in 2 months, upon return from her trip, to f/u on blood pressure.

## 2014-06-20 ENCOUNTER — Encounter: Payer: Self-pay | Admitting: Family Medicine

## 2014-06-20 ENCOUNTER — Ambulatory Visit (INDEPENDENT_AMBULATORY_CARE_PROVIDER_SITE_OTHER): Payer: Medicare HMO | Admitting: Family Medicine

## 2014-06-20 VITALS — BP 124/70 | HR 88 | Ht 61.0 in | Wt 163.0 lb

## 2014-06-20 DIAGNOSIS — E7800 Pure hypercholesterolemia, unspecified: Secondary | ICD-10-CM

## 2014-06-20 DIAGNOSIS — Z5181 Encounter for therapeutic drug level monitoring: Secondary | ICD-10-CM

## 2014-06-20 DIAGNOSIS — M109 Gout, unspecified: Secondary | ICD-10-CM

## 2014-06-20 DIAGNOSIS — I10 Essential (primary) hypertension: Secondary | ICD-10-CM | POA: Diagnosis not present

## 2014-06-20 DIAGNOSIS — M1000 Idiopathic gout, unspecified site: Secondary | ICD-10-CM

## 2014-06-20 MED ORDER — AMLODIPINE BESYLATE 5 MG PO TABS: 5.0000 mg | ORAL_TABLET | Freq: Every day | ORAL | Status: DC

## 2014-06-20 NOTE — Progress Notes (Signed)
Chief Complaint  Patient presents with  . Hypertension    2 month follow up. Brought in bp log with her today. Started whole pill on 05/28/14.   Patient presents to follow up on hypertension.  Amlodipine was added 2 months ago.  Started at just 2.5mg  since she was going on a cruise.  BP's remained elevated, so she increased to the full tablet on 1/25.  BP's on 2.5mg  were 130-160/70-85.  Since increasing to 5mg , her BP's have been better, ranging 116-135/60's-70's.  Denies any side effects.  Denies any edema (hasn't needed to use any diuretic).  She fainted once on her trip.  She was on a floating dock on a windy day, stuck in the middle of a pack of a large group of people, felt like she couldn't breathe.  She started to faint while getting off the dock (was able to talk and tell someone she didn't feel well, and was able to get to a chair--didn't truly faint). Another episode 2 weeks later--very windy, which was blowing her over, had trouble standing.  She also felt like she would pass out, and needed to sit.  Both were in very windy environments, in full sun. Felt better after sitting, didn't need to eat/drink.  She is asking if she needs to continue allopurinol.  Last uric acid was 4.1 in December, while on the allopurinol.  She hasn't had any further gout flares.  Uric acid was 6.5 prior to going on the allopurinol (after gout was diagnosed, not during flare).  PMH, PSH, SH reviewed  Outpatient Encounter Prescriptions as of 06/20/2014  Medication Sig Note  . allopurinol (ZYLOPRIM) 100 MG tablet Take 200 mg by mouth daily.   Marland Kitchen amLODipine (NORVASC) 5 MG tablet Take 1 tablet (5 mg total) by mouth daily.   Marland Kitchen aspirin 81 MG tablet Take 81 mg by mouth daily.     . Calcium Carbonate-Vitamin D (CALTRATE 600+D) 600-400 MG-UNIT per tablet Take 2 tablets by mouth daily. Takes one in the am everyday and Monday,wednesday and Friday take one at night also   . celecoxib (CELEBREX) 200 MG capsule Take 1  capsule (200 mg total) by mouth daily.   Marland Kitchen lisinopril (PRINIVIL,ZESTRIL) 40 MG tablet TAKE 1 TABLET (40 MG TOTAL) BY MOUTH DAILY.   . Multiple Vitamins-Minerals (CENTRUM SILVER PO) Take 1 tablet by mouth daily.     Marland Kitchen omeprazole (PRILOSEC) 20 MG capsule Take 20 mg by mouth as needed.     . pravastatin (PRAVACHOL) 20 MG tablet TAKE 1 TABLET EVERY DAY   . tolterodine (DETROL LA) 2 MG 24 hr capsule TAKE ONE CAPSULE BY MOUTH EVERY DAY   . [DISCONTINUED] amLODipine (NORVASC) 5 MG tablet Take 1/2 tablet by mouth once daily.  Increase to full tablet after 2-3 weeks if systolic BP remains over 938 (Patient taking differently: Take 5 mg by mouth daily. )   . acetaminophen (TYLENOL) 500 MG tablet Take 500 mg by mouth every 6 (six) hours as needed. 10/30/2013: Uses only prn headache  . calcium carbonate (TUMS - DOSED IN MG ELEMENTAL CALCIUM) 500 MG chewable tablet Chew 1 tablet by mouth as needed.   10/30/2013: Only prn heartburn (rarely needs, because Prilosec controls reflux)  . colchicine 0.6 MG tablet Take 2 tablets at onset of pain, then 1 tablet 1 hour later if needed. May then take twice daily if needed (Patient not taking: Reported on 05/02/2014)   . furosemide (LASIX) 20 MG tablet Take 0.5-1 tablets (10-20 mg  total) by mouth daily as needed for fluid or edema. (Patient not taking: Reported on 05/02/2014)    Allergies  Allergen Reactions  . Cephalexin Itching  . Ciprofloxacin Itching  . Diclofenac Nausea Only    Upset stomach  . Shellfish-Derived Products Nausea And Vomiting    ROS:  No fevers, chills, headaches, edema, GI or GU complaints. No chest pain, palpitations. No joint pains/gout. Moods are good--she had a wonderful trip.  Currently without any dizziness, none since returning from trip (just those episodes in the heat and wind).  PHYSICAL EXAM: BP 124/70 mmHg  Pulse 88  Ht 5\' 1"  (1.549 m)  Wt 163 lb (73.936 kg)  BMI 30.81 kg/m2  Well developed, pleasant female, talkative and in good  spirits Heart: regular rate and rhythm Lungs: clear bilaterally Extremities: no edema, 2+ pulses Neuro: alert and oriented.  Cranial nerves intact, normal gait, strength Psych: normal mood, affect, hygiene and grooming  ASSESSMENT/PLAN:  Essential hypertension, benign - well controlled; improved since adding 5mg  amlodipine - Plan: amLODipine (NORVASC) 5 MG tablet  Gout of big toe - at goal on current regimen--encouraged her to continue allopurinol for prevention   New pharmacy--[facility] for all prescriptions in future  Med check due end of June with fasting labs prior (ordered entered)

## 2014-06-20 NOTE — Patient Instructions (Signed)
Continue your current medications.  Medications that have refills on the bottle can be transferred.  When others come due and need to be refilled, let us know and we will send a new prescription to [facility] (make sure you specify this in your message to the nurse.

## 2014-06-26 ENCOUNTER — Telehealth: Payer: Self-pay | Admitting: Family Medicine

## 2014-06-26 DIAGNOSIS — R609 Edema, unspecified: Secondary | ICD-10-CM

## 2014-06-26 DIAGNOSIS — M109 Gout, unspecified: Secondary | ICD-10-CM

## 2014-06-26 NOTE — Telephone Encounter (Signed)
Rcvd request for new #90 RX's for Furosemide 20mg , Colchicine 0.6mg , Pravastatine 20mg , Tolerodine ER 2mg , Allopurinol 100mg 

## 2014-06-27 MED ORDER — TOLTERODINE TARTRATE ER 2 MG PO CP24: 2.0000 mg | ORAL_CAPSULE | Freq: Every day | ORAL | Status: DC

## 2014-06-27 MED ORDER — COLCHICINE 0.6 MG PO TABS: ORAL_TABLET | ORAL | Status: DC

## 2014-06-27 MED ORDER — PRAVASTATIN SODIUM 20 MG PO TABS: 20.0000 mg | ORAL_TABLET | Freq: Every day | ORAL | Status: DC

## 2014-06-27 MED ORDER — ALLOPURINOL 100 MG PO TABS: 200.0000 mg | ORAL_TABLET | Freq: Every day | ORAL | Status: DC

## 2014-06-27 MED ORDER — FUROSEMIDE 20 MG PO TABS: 10.0000 mg | ORAL_TABLET | Freq: Every day | ORAL | Status: DC | PRN

## 2014-06-27 NOTE — Telephone Encounter (Signed)
Done

## 2014-07-26 ENCOUNTER — Other Ambulatory Visit: Payer: Self-pay | Admitting: Family Medicine

## 2014-08-08 ENCOUNTER — Other Ambulatory Visit: Payer: Self-pay | Admitting: Family Medicine

## 2014-09-25 ENCOUNTER — Other Ambulatory Visit: Payer: Self-pay | Admitting: Family Medicine

## 2014-10-08 ENCOUNTER — Other Ambulatory Visit: Payer: Self-pay | Admitting: Family Medicine

## 2014-10-22 ENCOUNTER — Other Ambulatory Visit: Payer: Medicare HMO

## 2014-10-22 DIAGNOSIS — M109 Gout, unspecified: Secondary | ICD-10-CM

## 2014-10-22 DIAGNOSIS — Z5181 Encounter for therapeutic drug level monitoring: Secondary | ICD-10-CM

## 2014-10-22 DIAGNOSIS — E7800 Pure hypercholesterolemia, unspecified: Secondary | ICD-10-CM

## 2014-10-22 DIAGNOSIS — I10 Essential (primary) hypertension: Secondary | ICD-10-CM

## 2014-10-22 LAB — COMPREHENSIVE METABOLIC PANEL
ALT: 17 U/L (ref 0–35)
AST: 20 U/L (ref 0–37)
Albumin: 3.7 g/dL (ref 3.5–5.2)
Alkaline Phosphatase: 78 U/L (ref 39–117)
BUN: 26 mg/dL — ABNORMAL HIGH (ref 6–23)
CO2: 26 mEq/L (ref 19–32)
Calcium: 8.7 mg/dL (ref 8.4–10.5)
Chloride: 104 mEq/L (ref 96–112)
Creat: 1.13 mg/dL — ABNORMAL HIGH (ref 0.50–1.10)
Glucose, Bld: 92 mg/dL (ref 70–99)
Potassium: 4 mEq/L (ref 3.5–5.3)
Sodium: 143 mEq/L (ref 135–145)
Total Bilirubin: 0.4 mg/dL (ref 0.2–1.2)
Total Protein: 5.8 g/dL — ABNORMAL LOW (ref 6.0–8.3)

## 2014-10-22 LAB — LIPID PANEL
Cholesterol: 144 mg/dL (ref 0–200)
HDL: 35 mg/dL — ABNORMAL LOW (ref >=46)
LDL Cholesterol: 75 mg/dL (ref 0–99)
Total CHOL/HDL Ratio: 4.1 Ratio
Triglycerides: 168 mg/dL — ABNORMAL HIGH (ref <150)
VLDL: 34 mg/dL (ref 0–40)

## 2014-10-22 LAB — URIC ACID: Uric Acid, Serum: 5.1 mg/dL (ref 2.4–7.0)

## 2014-10-29 ENCOUNTER — Encounter: Payer: Self-pay | Admitting: Family Medicine

## 2014-10-29 ENCOUNTER — Ambulatory Visit (INDEPENDENT_AMBULATORY_CARE_PROVIDER_SITE_OTHER): Payer: Medicare HMO | Admitting: Family Medicine

## 2014-10-29 ENCOUNTER — Telehealth: Payer: Self-pay | Admitting: Family Medicine

## 2014-10-29 VITALS — BP 140/80 | HR 84 | Ht 61.0 in | Wt 165.5 lb

## 2014-10-29 DIAGNOSIS — M109 Gout, unspecified: Secondary | ICD-10-CM

## 2014-10-29 DIAGNOSIS — Z5181 Encounter for therapeutic drug level monitoring: Secondary | ICD-10-CM

## 2014-10-29 DIAGNOSIS — M1000 Idiopathic gout, unspecified site: Secondary | ICD-10-CM

## 2014-10-29 DIAGNOSIS — I10 Essential (primary) hypertension: Secondary | ICD-10-CM | POA: Diagnosis not present

## 2014-10-29 DIAGNOSIS — R609 Edema, unspecified: Secondary | ICD-10-CM | POA: Diagnosis not present

## 2014-10-29 DIAGNOSIS — M150 Primary generalized (osteo)arthritis: Secondary | ICD-10-CM

## 2014-10-29 DIAGNOSIS — Z853 Personal history of malignant neoplasm of breast: Secondary | ICD-10-CM

## 2014-10-29 DIAGNOSIS — M8949 Other hypertrophic osteoarthropathy, multiple sites: Secondary | ICD-10-CM

## 2014-10-29 DIAGNOSIS — E7800 Pure hypercholesterolemia, unspecified: Secondary | ICD-10-CM

## 2014-10-29 DIAGNOSIS — N3941 Urge incontinence: Secondary | ICD-10-CM | POA: Diagnosis not present

## 2014-10-29 DIAGNOSIS — L570 Actinic keratosis: Secondary | ICD-10-CM

## 2014-10-29 DIAGNOSIS — R600 Localized edema: Secondary | ICD-10-CM

## 2014-10-29 DIAGNOSIS — M159 Polyosteoarthritis, unspecified: Secondary | ICD-10-CM

## 2014-10-29 MED ORDER — CELECOXIB 200 MG PO CAPS: 200.0000 mg | ORAL_CAPSULE | Freq: Every day | ORAL | Status: DC

## 2014-10-29 NOTE — Progress Notes (Signed)
Chief Complaint  Patient presents with  . med check    med check. hard sport on hand- needs looked at.- needs rx for arm sleeves and prostheis. needs rx for lasix, celebrex   Hypertension follow-up:  Blood pressures elsewhere are 1-7-156/58-70, pulse 72-91.  They are mostly 110-130/65, with sporadic high up to 150's.  Denies dizziness, headaches, chest pain.  Denies side effects of medications. She has felt a little fatigued when the BP was on the lower end. No further syncope since her trip.  BP's overall are much better controlled since adding the amlodipine.  Gout: She hasn't had any recurrent gout of her great toe, on her current dose of allopurinol (267m).   Hyperlipidemia: She had fasting labs prior to her visit. She hasn't tolerated fish oil in the past. She doesn't fry at home, but does sometimes order fried food when out, less than once a week. She tries to follow a lowfat, low cholesterol diet. She is compliant with her medications and denies any side effects.  Her lipids were at goal in December.  She had a time where she wasn't walking as much since then.  Arthritis--doing well with Celebrex once daily. Pain is in back, feet and hands, and medication is effective at reducing pain. Celebrex works very well for her. She hasn't had a flare of swelling in her fingers in quite a while. Previously tried tylenol without adequate pain relief. Denies any GI side effects, takes with her breakfast.   Urge urinary incontinence--She is taking Detrol, which was more effective than Vesicare for a while, but not as good as it was initially. Denies any significant side effects. Denies any dysuria, odor or hematuria. She still gets up 2-3 times/night; daytime urgency/frequency has improved since switching to the Detrol.  It is starting to come back a little bit, not quite as good as when she first switched, but still tolerable. Sometimes has leaking when going to the bathroom in the middle of the  night due to the urgency.  GERD--doing well with daily omeprazole. Only occasionally requires a Tums (3 in the last month).   Edema--she has been needing to use the furosemide most days over the last month due to swelling in her feet.  Denies any change in sodium intake. +lunch meat. She is avoiding soups. She is asking for refill of lasix--she [facility] picked [facility]  [employer] nurse did an assessment a couple of weeks ago, and mentioned wax in ears and heart murmur. She denies any ear discomfort or change in hearing. Continues to wear bilateral hearing aids with improvement in hearing.  PMH, PSH, SH reviewed and updated. No changes in family history  Outpatient Encounter Prescriptions as of 10/29/2014  Medication Sig Note  . allopurinol (ZYLOPRIM) 100 MG tablet TAKE 2 TABLETS (200 MG TOTAL) BY MOUTH DAILY.   .Marland KitchenamLODipine (NORVASC) 5 MG tablet Take 1 tablet (5 mg total) by mouth daily.   .Marland Kitchenaspirin 81 MG tablet Take 81 mg by mouth daily.     . Calcium Carbonate-Vitamin D (CALTRATE 600+D) 600-400 MG-UNIT per tablet Take 2 tablets by mouth daily. Takes one in the am everyday and Monday,wednesday and Friday take one at night also   . celecoxib (CELEBREX) 200 MG capsule Take 1 capsule (200 mg total) by mouth daily.   . colchicine 0.6 MG tablet Take 2 tablets at onset of pain, then 1 tablet 1 hour later if needed. May then take twice  daily if needed   . furosemide (LASIX) 20 MG tablet Take 0.5-1 tablets (10-20 mg total) by mouth daily as needed for fluid or edema.   . lisinopril (PRINIVIL,ZESTRIL) 40 MG tablet TAKE 1 TABLET (40 MG TOTAL) BY MOUTH DAILY.   . Multiple Vitamins-Minerals (CENTRUM SILVER PO) Take 1 tablet by mouth daily.     . omeprazole (PRILOSEC) 20 MG capsule Take 20 mg by mouth as needed.     . pravastatin (PRAVACHOL) 20 MG tablet Take 1 tablet (20 mg total) by mouth daily.   . pravastatin (PRAVACHOL) 20 MG tablet TAKE 1 TABLET BY MOUTH ONCE DAILY   .  tolterodine (DETROL LA) 2 MG 24 hr capsule TAKE 1 CAPSULE (2 MG TOTAL) BY MOUTH DAILY.   . [DISCONTINUED] celecoxib (CELEBREX) 200 MG capsule Take 1 capsule (200 mg total) by mouth daily.   . acetaminophen (TYLENOL) 500 MG tablet Take 500 mg by mouth every 6 (six) hours as needed. 10/30/2013: Uses only prn headache  . calcium carbonate (TUMS - DOSED IN MG ELEMENTAL CALCIUM) 500 MG chewable tablet Chew 1 tablet by mouth as needed.   10/30/2013: Only prn heartburn (rarely needs, because Prilosec controls reflux)   No facility-administered encounter medications on file as of 10/29/2014.   Allergies  Allergen Reactions  . Cephalexin Itching  . Ciprofloxacin Itching  . Diclofenac Nausea Only    Upset stomach  . Shellfish-Derived Products Nausea And Vomiting    ROS:  No fever, chills, headaches, dizziness, chest pain, shortness of breath, nausea, vomiting, bowel changes or other GI complaints. No dysuria, hematuria, no vaginal bleeding.  No depression, anxiety, insomnia. Joint pains as per HPI, no gout flares.  No numbness, tingling, weakness, tremor or other neuro complaints.  PHYSICAL EXAM: BP 140/80 mmHg  Pulse 84  Ht 5' 1" (1.549 m)  Wt 165 lb 8 oz (75.07 kg)  BMI 31.29 kg/m2 Well developed, pleasant female, in good spirits HEENT: PERRL, EOMi, conjunctiva clear.  TM's and EACs normal. No cerumen in ears. Bilateral hearing aids (which were removed for exam).  OP is clear Neck: no lymphadenopathy, thyromegaly or carotid bruit Heart: regular rate and rhythm. No heart murmur Lungs: clear bilaterally Back: no CVA or spinal tenderness Abdomen: soft, nontender, no organomegaly or mass Extremities: Trace to 1+ edema. 2+ pulses Skin: no bruising, rashes.   AK right hand at base of thumb Neuro: alert and oriented. Cranial nerves, strength, sensation and gait all normal Psych: normal mood, affect, hygiene and grooming   Uric acid 5.1 Lab Results  Component Value Date   CHOL 144 10/22/2014    HDL 35* 10/22/2014   LDLCALC 75 10/22/2014   TRIG 168* 10/22/2014   CHOLHDL 4.1 10/22/2014     Chemistry      Component Value Date/Time   NA 143 10/22/2014 0001   NA 141 11/11/2012 1020   K 4.0 10/22/2014 0001   K 4.1 11/11/2012 1020   CL 104 10/22/2014 0001   CO2 26 10/22/2014 0001   CO2 26 11/11/2012 1020   BUN 26* 10/22/2014 0001   BUN 31.6* 11/11/2012 1020   CREATININE 1.13* 10/22/2014 0001   CREATININE 1.5* 11/11/2012 1020   CREATININE 1.43* 11/12/2011 1405      Component Value Date/Time   CALCIUM 8.7 10/22/2014 0001   CALCIUM 10.1 11/11/2012 1020   ALKPHOS 78 10/22/2014 0001   ALKPHOS 78 11/11/2012 1020   AST 20 10/22/2014 0001   AST 19 11/11/2012 1020   ALT 17 10/22/2014   0001   ALT 17 11/11/2012 1020   BILITOT 0.4 10/22/2014 0001   BILITOT 0.43 11/11/2012 1020     Glucose 92   ASSESSMENT/PLAN:  Essential hypertension, benign - controlled  Pure hypercholesterolemia  Gout of big toe - no flares on current medication for prevention--continue  Primary osteoarthritis involving multiple joints - controlled with Celebrex once daily - Plan: celecoxib (CELEBREX) 200 MG capsule  Peripheral edema - low sodium diet, compression stockings; lasix prn (has rx at [facility] she can fill)  Actinic keratosis - hand. offered return visit to treat with cryotherapy; she will wait for August visit with derm  Urge incontinence of urine - adequately controlled   Your triglycerides were a little high--be very careful and follow a lowfat diet and get regular exercise to help raise the HDL to where they were before (they dropped). Continue your same medications.  Try and continue to avoid sodium in the diet (reading labels as well as not adding salt). Consider wearing compression stockings to help with the swelling; keep your legs elevated when relaxing at home.  Make sure to stay well hydrated.  Please check with your insurance--you will be due for a tetanus booster in 2017.  Usually  Medicare doesn't cover this, but sometimes part D does, which would require you to get it at the pharmacy.  Let us know at your physical.  If they don't cover it at all, we can let you know the out of pocket cost and you can decide if you'll let us give it to you.  6 months for CPE/med check, with labs prior c-met, lipid, TSH, CBC  H/o breast cancer--written rx'd provided for new right breast prosthesis, and for compression sleeve 

## 2014-10-29 NOTE — Patient Instructions (Signed)
Your triglycerides were a little high--be very careful and follow a lowfat diet and get regular exercise to help raise the HDL to where they were before (they dropped). Continue your same medications.  Try and continue to avoid sodium in the diet (reading labels as well as not adding salt). Consider wearing compression stockings to help with the swelling; keep your legs elevated when relaxing at home.  Make sure to stay well hydrated.  Please check with your insurance--you will be due for a tetanus booster in 2017.  Usually Medicare doesn't cover this, but sometimes part D does, which would require you to get it at the pharmacy.  Let us know at your physical.  If they don't cover it at all, we can let you know the out of pocket cost and you can decide if you'll let us give it to you.

## 2014-10-29 NOTE — Telephone Encounter (Signed)
Pt wants to come in a week prior to CPE for labs, is this ok?

## 2014-10-29 NOTE — Telephone Encounter (Signed)
Yes. Orders will be entered today

## 2014-11-06 ENCOUNTER — Other Ambulatory Visit: Payer: Self-pay | Admitting: Family Medicine

## 2014-11-06 NOTE — Telephone Encounter (Signed)
done

## 2014-11-06 NOTE — Telephone Encounter (Signed)
Dr.Knapp I have read through your last office note these meds were not refilled are they okay to refill

## 2014-11-20 ENCOUNTER — Telehealth: Payer: Self-pay | Admitting: Family Medicine

## 2014-11-20 NOTE — Telephone Encounter (Signed)
Called in to pharmacy informed pt

## 2014-11-20 NOTE — Telephone Encounter (Signed)
Please call Needs to update Tetanus, last one 11 years ago and they are also traveling out of the country also   Needs order sent to pharmacy  [facility]

## 2014-11-20 NOTE — Telephone Encounter (Signed)
She has had Tdap in 2007, so only needs Td. Please fax order to pharmacy for Td booster and advise pt. Have pt contact us when she gets it so we can put date in epic.

## 2014-11-26 ENCOUNTER — Other Ambulatory Visit: Payer: Self-pay | Admitting: Family Medicine

## 2015-01-08 ENCOUNTER — Other Ambulatory Visit: Payer: Self-pay | Admitting: Family Medicine

## 2015-01-14 ENCOUNTER — Other Ambulatory Visit: Payer: Self-pay | Admitting: Family Medicine

## 2015-03-07 DIAGNOSIS — H43813 Vitreous degeneration, bilateral: Secondary | ICD-10-CM | POA: Diagnosis not present

## 2015-03-07 DIAGNOSIS — H04123 Dry eye syndrome of bilateral lacrimal glands: Secondary | ICD-10-CM | POA: Diagnosis not present

## 2015-03-07 DIAGNOSIS — Z961 Presence of intraocular lens: Secondary | ICD-10-CM | POA: Diagnosis not present

## 2015-04-09 ENCOUNTER — Other Ambulatory Visit: Payer: Self-pay | Admitting: Family Medicine

## 2015-04-11 ENCOUNTER — Other Ambulatory Visit: Payer: Self-pay | Admitting: Family Medicine

## 2015-04-11 NOTE — Telephone Encounter (Signed)
Scheduled lab appt for 05/14/15.

## 2015-04-11 NOTE — Telephone Encounter (Signed)
Is this okay to refill? 

## 2015-04-11 NOTE — Telephone Encounter (Signed)
Looks like she has future orders for labs due the end of the month. She has visit scheduled for January, but it doesn't look like she has lab visit scheduled.  Please schedule labs. Okay to refill #90

## 2015-04-25 ENCOUNTER — Other Ambulatory Visit: Payer: Self-pay | Admitting: Family Medicine

## 2015-05-14 ENCOUNTER — Other Ambulatory Visit: Payer: Medicare HMO

## 2015-05-14 DIAGNOSIS — Z5181 Encounter for therapeutic drug level monitoring: Secondary | ICD-10-CM | POA: Diagnosis not present

## 2015-05-14 DIAGNOSIS — E7800 Pure hypercholesterolemia, unspecified: Secondary | ICD-10-CM | POA: Diagnosis not present

## 2015-05-14 DIAGNOSIS — R609 Edema, unspecified: Secondary | ICD-10-CM

## 2015-05-14 DIAGNOSIS — Z853 Personal history of malignant neoplasm of breast: Secondary | ICD-10-CM | POA: Diagnosis not present

## 2015-05-14 LAB — COMPREHENSIVE METABOLIC PANEL
ALT: 17 U/L (ref 6–29)
AST: 19 U/L (ref 10–35)
Albumin: 3.8 g/dL (ref 3.6–5.1)
Alkaline Phosphatase: 73 U/L (ref 33–130)
BUN: 29 mg/dL — ABNORMAL HIGH (ref 7–25)
CO2: 27 mmol/L (ref 20–31)
Calcium: 9.7 mg/dL (ref 8.6–10.4)
Chloride: 104 mmol/L (ref 98–110)
Creat: 1.09 mg/dL — ABNORMAL HIGH (ref 0.60–0.88)
Glucose, Bld: 94 mg/dL (ref 65–99)
Potassium: 4.1 mmol/L (ref 3.5–5.3)
Sodium: 139 mmol/L (ref 135–146)
Total Bilirubin: 0.5 mg/dL (ref 0.2–1.2)
Total Protein: 5.8 g/dL — ABNORMAL LOW (ref 6.1–8.1)

## 2015-05-14 LAB — LIPID PANEL
Cholesterol: 162 mg/dL (ref 125–200)
HDL: 44 mg/dL — ABNORMAL LOW (ref >=46)
LDL Cholesterol: 89 mg/dL (ref <130)
Total CHOL/HDL Ratio: 3.7 Ratio (ref <=5.0)
Triglycerides: 146 mg/dL (ref <150)
VLDL: 29 mg/dL (ref <30)

## 2015-05-14 LAB — CBC WITH DIFFERENTIAL/PLATELET
Basophils Absolute: 0 10*3/uL (ref 0.0–0.1)
Basophils Relative: 0 % (ref 0–1)
Eosinophils Absolute: 0.2 10*3/uL (ref 0.0–0.7)
Eosinophils Relative: 3 % (ref 0–5)
HCT: 38.8 % (ref 36.0–46.0)
Hemoglobin: 13.2 g/dL (ref 12.0–15.0)
Lymphocytes Relative: 24 % (ref 12–46)
Lymphs Abs: 1.4 10*3/uL (ref 0.7–4.0)
MCH: 30 pg (ref 26.0–34.0)
MCHC: 34 g/dL (ref 30.0–36.0)
MCV: 88.2 fL (ref 78.0–100.0)
MPV: 11.8 fL (ref 8.6–12.4)
Monocytes Absolute: 0.5 10*3/uL (ref 0.1–1.0)
Monocytes Relative: 9 % (ref 3–12)
Neutro Abs: 3.8 10*3/uL (ref 1.7–7.7)
Neutrophils Relative %: 64 % (ref 43–77)
Platelets: 163 10*3/uL (ref 150–400)
RBC: 4.4 MIL/uL (ref 3.87–5.11)
RDW: 14.6 % (ref 11.5–15.5)
WBC: 5.9 10*3/uL (ref 4.0–10.5)

## 2015-05-14 LAB — TSH: TSH: 3.414 u[IU]/mL (ref 0.350–4.500)

## 2015-05-23 ENCOUNTER — Encounter: Payer: Self-pay | Admitting: Family Medicine

## 2015-05-23 ENCOUNTER — Ambulatory Visit (INDEPENDENT_AMBULATORY_CARE_PROVIDER_SITE_OTHER): Payer: Medicare HMO | Admitting: Family Medicine

## 2015-05-23 VITALS — BP 128/70 | HR 80 | Ht 60.25 in | Wt 161.8 lb

## 2015-05-23 DIAGNOSIS — Z853 Personal history of malignant neoplasm of breast: Secondary | ICD-10-CM

## 2015-05-23 DIAGNOSIS — I10 Essential (primary) hypertension: Secondary | ICD-10-CM

## 2015-05-23 DIAGNOSIS — M159 Polyosteoarthritis, unspecified: Secondary | ICD-10-CM

## 2015-05-23 DIAGNOSIS — M109 Gout, unspecified: Secondary | ICD-10-CM

## 2015-05-23 DIAGNOSIS — M150 Primary generalized (osteo)arthritis: Secondary | ICD-10-CM

## 2015-05-23 DIAGNOSIS — E7800 Pure hypercholesterolemia, unspecified: Secondary | ICD-10-CM | POA: Diagnosis not present

## 2015-05-23 DIAGNOSIS — Z0000 Encounter for general adult medical examination without abnormal findings: Secondary | ICD-10-CM

## 2015-05-23 DIAGNOSIS — Z5181 Encounter for therapeutic drug level monitoring: Secondary | ICD-10-CM | POA: Diagnosis not present

## 2015-05-23 DIAGNOSIS — C50911 Malignant neoplasm of unspecified site of right female breast: Secondary | ICD-10-CM

## 2015-05-23 DIAGNOSIS — N3941 Urge incontinence: Secondary | ICD-10-CM | POA: Diagnosis not present

## 2015-05-23 DIAGNOSIS — M8580 Other specified disorders of bone density and structure, unspecified site: Secondary | ICD-10-CM

## 2015-05-23 DIAGNOSIS — M8949 Other hypertrophic osteoarthropathy, multiple sites: Secondary | ICD-10-CM

## 2015-05-23 DIAGNOSIS — M1000 Idiopathic gout, unspecified site: Secondary | ICD-10-CM

## 2015-05-23 LAB — POCT URINALYSIS DIPSTICK
Bilirubin, UA: NEGATIVE
Blood, UA: NEGATIVE
Glucose, UA: NEGATIVE
Ketones, UA: NEGATIVE
Leukocytes, UA: NEGATIVE
Nitrite, UA: NEGATIVE
Protein, UA: NEGATIVE
Spec Grav, UA: >=1.03
Urobilinogen, UA: NEGATIVE
pH, UA: 5

## 2015-05-23 MED ORDER — SOLIFENACIN SUCCINATE 5 MG PO TABS: 5.0000 mg | ORAL_TABLET | Freq: Every day | ORAL | Status: DC

## 2015-05-23 MED ORDER — PRAVASTATIN SODIUM 20 MG PO TABS: 20.0000 mg | ORAL_TABLET | Freq: Every day | ORAL | Status: DC

## 2015-05-23 NOTE — Patient Instructions (Addendum)
  HEALTH MAINTENANCE RECOMMENDATIONS:  It is recommended that you get at least 30 minutes of aerobic exercise at least 5 days/week (for weight loss, you may need as much as 60-90 minutes). This can be any activity that gets your heart rate up. This can be divided in 10-15 minute intervals if needed, but try and build up your endurance at least once a week.  Weight bearing exercise is also recommended twice weekly.  Eat a healthy diet with lots of vegetables, fruits and fiber.  "Colorful" foods have a lot of vitamins (ie green vegetables, tomatoes, red peppers, etc).  Limit sweet tea, regular sodas and alcoholic beverages, all of which has a lot of calories and sugar.  Up to 1 alcoholic drink daily may be beneficial for women (unless trying to lose weight, watch sugars).  Drink a lot of water.  Calcium recommendations are 1200-1500 mg daily (1500 mg for postmenopausal women or women without ovaries), and vitamin D 1000 IU daily.  This should be obtained from diet and/or supplements (vitamins), and calcium should not be taken all at once, but in divided doses.  Monthly self breast exams and yearly mammograms for women over the age of 11 is recommended.  Sunscreen of at least SPF 30 should be used on all sun-exposed parts of the skin when outside between the hours of 10 am and 4 pm (not just when at beach or pool, but even with exercise, golf, tennis, and yard work!)  Use a sunscreen that says "broad spectrum" so it covers both UVA and UVB rays, and make sure to reapply every 1-2 hours.  Remember to change the batteries in your smoke detectors when changing your clock times in the spring and fall.  Use your seat belt every time you are in a car, and please drive safely and not be distracted with cell phones and texting while driving.   Ms. Crehan , Thank you for taking time to come for your Medicare Wellness Visit. I appreciate your ongoing commitment to your health goals. Please review the  following plan we discussed and let me know if I can assist you in the future.   These are the goals we discussed: Goals    None      This is a list of the screening recommended for you and due dates:  Health Maintenance  Topic Date Due  . Flu Shot  12/03/2014  . Tetanus Vaccine  06/05/2015  . DEXA scan (bone density measurement)  Completed  . Shingles Vaccine  Completed  . Pneumonia vaccines  Completed   You need to have a tetanus booster. You just need Td, not the TdaP because you have had Tdap in the past.  Your insurance likely doesn't cover for [place] to give it in the office, so I wrote you a prescription to get it at the pharmacy.  If you can't, we are happy to give it to you here, but you will need to pay for it. You told us today that you got your flu shot in October, so you are not past due. You are due for another bone density test, as well as your mammogram. [facility] to [facility] both your mammogram and bone density test.

## 2015-05-23 NOTE — Progress Notes (Signed)
Chief Complaint  Patient presents with  . Medicare Wellness    non fasting med check plus with pap. Insurance company will no longer pat for [facility] brought paperwork with her with alternatives.  She is having more soft stools than usual and lots of gas. Tremor is hands is worseing at times.    Connie [name] is a 82 y.o. female who presents for a complete physical, annual wellness visit and follow up on chronic problems.  She has the following concerns:  Hypertension follow-up: Blood pressures elsewhere are 112-147/63-74, pulse 73-86 (mostly running 790'W-409 max systolic).Denies dizziness, headaches, chest pain. Denies side effects of medications.    Gout: She hasn't had any recurrent gout of her great toe, on her current dose of allopurinol (284m).   Hyperlipidemia: She had fasting labs prior to her visit. She hasn't tolerated fish oil in the past. She doesn't fry at home, but does sometimes order fried food when out, less than once a week. She tries to follow a lowfat, low cholesterol diet. She is compliant with her medications and denies any side effects.  Arthritis--doing well with Celebrex once daily. Pain is in back, feet and hands, and medication is effective at reducing pain. Celebrex works very well for her. She hasn't had a flare of swelling in her fingers in quite a while (just occasional tenderness at the finger joints, but no swelling). Previously tried tylenol without adequate pain relief. Denies any GI side effects, takes with her breakfast.   Urge urinary incontinence--She is curently taking Detrol, which was more effective than Vesicare for a while, but not as good as it was initially. Denies any significant side effects. Denies any dysuria, odor or hematuria. She still gets up 2-3 times/night; and has some daytime urgency/frequency (intermittent).  Sometimes has leaking when going to the bathroom in the middle of the night due to the urgency, and sometimes  has a lot of urgency when opening the door back into the house.  Detrol is now Tier 4, and asking to change.  Review of 2017 booklet she brought in shows that vesicare and oxybutynin ER are Tier 3, others are all Tier 4. Willing to re-try the vesicare first.  GERD--no longer needing daily omeprazole, only a couple of times per week. Rarely needs Tums (one in the last 3 months).  Edema--since the weather got cooler, not as much problems with swelling. Not needing to use the lasix.    Osteopenia/low bone mass--last DEXA was 12/2011. She previously took Fosamax (for 10 years, per pt). She stopped it prior to dental work, about 5 years ago. She doesn't currently get any weight-bearing exercise other than walking. She takes calcium +D--see med list.   Immunization History  Administered Date(s) Administered  . Influenza Split 02/02/2011, 02/02/2012, 01/19/2013  . Influenza-Unspecified 01/16/2014, 02/02/2015  . Pneumococcal Conjugate-13 05/01/2013  . Pneumococcal Polysaccharide-23 06/04/2005  . Tdap 06/04/2005  . Zoster 06/04/2005   Last Pap smear: 6/09  Last mammogram: 04/25/14 Last colonoscopy: 12/2008  Last DEXA: per Dr. RTruddie Coco 12/2011  Dentist: regular, 1-2 times/year Ophtho: yearly  Exercise: She started walking 40 minutes 3-4 times a week, just started again this month.   Other doctors caring for patient include: Ophtho: Dr. OSyrian Arab RepublicDentist: Dr. BPhilipp OvensOncologist: Dr. KHumphrey Rolls(she has been released from her care) Derm: Dr. LLedell PeoplesPA, DLennie OdorGI: Dr. JWynetta Emery Depression screen:  Negative Fall screen: negative Functionatl status screen: no significant problems (hearing aid, trouble with remembering names, chronic/unchanged)--see full questionnaire in  epic.  End of Life Discussion:  Patient has a living will and medical power of attorney  Past Medical History  Diagnosis Date  . Hypertension   . Hyperlipidemia   . Arthritis     OA spine, hands  . DDD (degenerative  disc disease), lumbar   . Adenomatous colon polyp 1/03  . Abnormal brain MRI 8/01    small hemorrhagic stroke and possible cavernous hemangioma (Dr. Erling Cruz)  . Osteopenia     (DEXA's done by Dr. Truddie Coco); prev took Fosamax.  No change in DEXA after off Fosamax x 2 years  . Postmenopausal bleeding 2000    benign EMB (Dr. Raphael Gibney)  . GERD (gastroesophageal reflux disease)   . Herpes zoster 2003  . Onychomycosis 2003, 2008    treated with Lamisil  . Breast cancer ([place]) 2001    R breast (T3N1, ER/PR+, HER-2 +) s/p mastectomy, chemo and chest wall irradiation (Dr. Truddie Coco)  . Urge urinary incontinence   . Ovarian cyst 12/2008    right  . Hearing loss 2012    wears hearing aids    Past Surgical History  Procedure Laterality Date  . Tubal ligation    . Tonsillectomy    . Modified mastectomy and tram flap reconstruction  2001    R breast  . Tram flap removal      problems with flap after radiation  . Cholecystectomy, laparoscopic  1/05  . Colonoscopy w/ polypectomy  05/23/01    adenomatous polyp  . Colonoscopy  12/17/08    normal (Dr. Wynetta Emery)  . Cataract extraction  summer 2011    bilateral    Social History   Social History  . Marital Status: Married    Spouse Name: N/A  . Number of Children: 3  . Years of Education: N/A   Occupational History  . Not on file.   Social History Main Topics  . Smoking status: Never Smoker   . Smokeless tobacco: Never Used  . Alcohol Use: No  . Drug Use: No  . Sexual Activity: Not Currently     Comment: due to poor libido   Other Topics Concern  . Not on file   Social History Narrative   Lives with husband, cat.  1 daughter at [employer]; daughter in [place] (works in Conservator, museum/gallery at [employer]), son in [place]. 8 grandchildren, 1 great-grandchild due 06/2015    Family History  Problem Relation Age of Onset  . Heart disease Mother   . Hypertension Mother   . Heart disease Father   . Hypertension Father   . Diabetes Brother   . Heart  disease Brother   . Drug abuse Neg Hx   . Cancer Neg Hx     Outpatient Encounter Prescriptions as of 05/23/2015  Medication Sig Note  . allopurinol (ZYLOPRIM) 100 MG tablet TAKE 2 TABLETS (200 MG TOTAL) BY MOUTH DAILY.   Marland Kitchen amLODipine (NORVASC) 5 MG tablet TAKE 1 TABLET (5 MG TOTAL) BY MOUTH DAILY.   Marland Kitchen aspirin 81 MG tablet Take 81 mg by mouth daily.     . Calcium Carbonate-Vitamin D (CALTRATE 600+D) 600-400 MG-UNIT per tablet Take 2 tablets by mouth daily. Takes one in the am everyday and Monday,wednesday and Friday take one at night also   . celecoxib (CELEBREX) 200 MG capsule TAKE 1 CAPSULE (200 MG TOTAL) BY MOUTH DAILY.   Marland Kitchen lisinopril (PRINIVIL,ZESTRIL) 40 MG tablet TAKE 1 TABLET BY MOUTH DAILY   . Multiple Vitamins-Minerals (CENTRUM SILVER PO) Take 1  tablet by mouth daily.     . pravastatin (PRAVACHOL) 20 MG tablet TAKE 1 TABLET BY MOUTH ONCE DAILY   . tolterodine (DETROL LA) 2 MG 24 hr capsule TAKE 1 CAPSULE (2 MG TOTAL) BY MOUTH DAILY.   Marland Kitchen acetaminophen (TYLENOL) 500 MG tablet Take 500 mg by mouth every 6 (six) hours as needed. Reported on 05/23/2015 10/30/2013: Uses only prn headache  . calcium carbonate (TUMS - DOSED IN MG ELEMENTAL CALCIUM) 500 MG chewable tablet Chew 1 tablet by mouth as needed. Reported on 05/23/2015 10/30/2013: Only prn heartburn (rarely needs, because Prilosec controls reflux)  . colchicine 0.6 MG tablet Take 2 tablets at onset of pain, then 1 tablet 1 hour later if needed. May then take twice daily if needed (Patient not taking: Reported on 05/23/2015)   . furosemide (LASIX) 20 MG tablet Take 0.5-1 tablets (10-20 mg total) by mouth daily as needed for fluid or edema. (Patient not taking: Reported on 05/23/2015) 05/23/2015: Rarely uses; swelling was worse in the heat  . omeprazole (PRILOSEC) 20 MG capsule Take 20 mg by mouth as needed. Reported on 05/23/2015 05/23/2015: Using prn, only about 2/week  . [DISCONTINUED] pravastatin (PRAVACHOL) 20 MG tablet TAKE 1 TABLET (20 MG  TOTAL) BY MOUTH DAILY.    No facility-administered encounter medications on file as of 05/23/2015.    Allergies  Allergen Reactions  . Cephalexin Itching  . Ciprofloxacin Itching  . Diclofenac Nausea Only    Upset stomach  . Shellfish-Derived Products Nausea And Vomiting    ROS: The patient denies anorexia, fever, weight changes, headaches, vision changes, ear pain, sore throat, breast concerns, chest pain, dizziness, syncope, dyspnea on exertion, cough, swelling, nausea, vomiting, diarrhea, constipation, abdominal pain, melena, hematochezia, indigestion/heartburn, hematuria, dysuria, vaginal bleeding, discharge, odor or itch, genital lesions, weakness, tremor, suspicious skin lesions, depression, anxiety, abnormal bleeding/bruising, or enlarged lymph nodes.  ongoing numbness in fingers and toes since her chemo (most in the right hand); no weakness. +urge urinary incontinence (see HPI). +joint pains in hands/fingers/feet--relieved by Celebrex.  +hearing loss--has hearing aids.  Denies any thyroid symptoms She keeps losing the right 2nd toenail with a lot of walking (ie [employer] 5K), even when wore open toed sandal. Stools have been softer than usual, soft most of the time--no diarrhea, mucus, blood, abdominal pain.  Not aware of any changes in her diet.   PHYSICAL EXAM:  BP 128/70 mmHg  Pulse 80  Ht 5' 0.25" (1.53 m)  Wt 161 lb 12.8 oz (73.392 kg)  BMI 31.35 kg/m2  General Appearance:  Alert, cooperative, no distress, appears stated age   Head:  Normocephalic, without obvious abnormality, atraumatic   Eyes:  PERRL, conjunctiva/corneas clear, EOM's intact, fundi  benign   Ears:  Wearing hearing aids. TM's and EAC's were normal  Nose:  Nares normal, mucosa normal, no drainage or sinus tenderness   Throat:  Lips, mucosa, and tongue normal; teeth and gums normal.   Neck:  Supple, no lymphadenopathy; thyroid: no enlargement/tenderness/nodules; no  carotid  bruit or JVD   Back:  Spine nontender, no curvature, ROM normal, no CVA tenderness   Lungs:  Clear to auscultation bilaterally without wheezes, rales or ronchi; respirations unlabored   Chest Wall:  No tenderness or deformity   Heart:  Regular rate and rhythm, S1 and S2 normal, no murmur, rub  or gallop. Occasional ectopic beats  Breast Exam:  R breast absent, well healed surgical scars, nontender. L breast normal-- no masses, or nipple discharge. nipple  is mildly inverted, chronic. No axillary lymphadenopathy   Abdomen:  Soft, non-tender, nondistended, normoactive bowel sounds,  no masses, no hepatosplenomegaly   Genitalia:  Normal external genitalia without lesions. BUS and vagina normal, mild atrophic changes; no cervical motion tenderness. No abnormal vaginal discharge. Uterus normal size, nontender, no masses. No adnexal masses or tenderness; exam is somewhat limited by body habitus. Pap not performed   Rectal:  Normal tone, no masses or tenderness; guaiac negative stool   Extremities:  No clubbing or cyanosis. Bunion deformities noted bilaterally, R>L, slight hammertoe deformity of the R 2nd toe.  No tenderness. No edema.  Pulses:  2+ and symmetric all extremities   Skin:  Skin color, texture, turgor normal, no rashes or lesions. Many angiomas, one below left breast is minimally inflamed  Lymph nodes:  Cervical, supraclavicular, and axillary nodes normal   Neurologic:  CNII-XII intact, normal strength, sensation and gait; reflexes 2+ and symmetric throughout    Psych: Normal mood, affect, hygiene and grooming        Lab Results  Component Value Date   WBC 5.9 05/14/2015   HGB 13.2 05/14/2015   HCT 38.8 05/14/2015   MCV 88.2 05/14/2015   PLT 163 05/14/2015   Lab Results  Component Value Date   TSH 3.414 05/14/2015     Chemistry      Component Value Date/Time   NA 139 05/14/2015 0001    NA 141 11/11/2012 1020   K 4.1 05/14/2015 0001   K 4.1 11/11/2012 1020   CL 104 05/14/2015 0001   CO2 27 05/14/2015 0001   CO2 26 11/11/2012 1020   BUN 29* 05/14/2015 0001   BUN 31.6* 11/11/2012 1020   CREATININE 1.09* 05/14/2015 0001   CREATININE 1.5* 11/11/2012 1020   CREATININE 1.43* 11/12/2011 1405      Component Value Date/Time   CALCIUM 9.7 05/14/2015 0001   CALCIUM 10.1 11/11/2012 1020   ALKPHOS 73 05/14/2015 0001   ALKPHOS 78 11/11/2012 1020   AST 19 05/14/2015 0001   AST 19 11/11/2012 1020   ALT 17 05/14/2015 0001   ALT 17 11/11/2012 1020   BILITOT 0.5 05/14/2015 0001   BILITOT 0.43 11/11/2012 1020     Fasting glucose 94  Lab Results  Component Value Date   CHOL 162 05/14/2015   HDL 44* 05/14/2015   LDLCALC 89 05/14/2015   TRIG 146 05/14/2015   CHOLHDL 3.7 05/14/2015     ASSESSMENT/PLAN:  Annual physical exam - Plan: Urinalysis Dipstick  Osteopenia - due for repeat DEXA - Plan: DG Bone Density  History of breast cancer - Plan: DG Bone Density  Essential hypertension, benign - controlled - Plan: Urinalysis Dipstick  Pure hypercholesterolemia - controlled - Plan: pravastatin (PRAVACHOL) 20 MG tablet  Urge incontinence of urine - change back to vesicare due to formulary change - Plan: Urinalysis Dipstick, solifenacin (VESICARE) 5 MG tablet  Gout of big toe - no problems; continue allopurinol  Primary osteoarthritis involving multiple joints - continue celebrex; risks reviewed  Malignant neoplasm of right female breast, unspecified site of breast ([place]) - due for mammo, advised to schedule  Encounter for Medicare annual wellness exam   Discussed monthly self breast exams and yearly mammograms; at least 30 minutes of aerobic activity at least 5 days/week; add weight-bearing exercise at least 2x/wk; proper sunscreen use reviewed; healthy diet, including goals of calcium and vitamin D intake and alcohol recommendations (less than or equal to 1 drink/day)  reviewed; regular seatbelt use; changing  batteries in smoke detectors. Immunization recommendations discussed--rx given to get Td at pharmacy. Colonoscopy recommendations reviewed--UTD. Pap smear not indicated due to age and low risk.  DEXA and mammo are due--[facility] advised [facility] schedule.  She has a living will and a healthcare power of attorney. Full Code, Full Care.  F/u in 6 months, labs prior: CBC, c-met, lipid, uric acid    Medicare Attestation I have personally reviewed: The patient's medical and social history Their use of alcohol, tobacco or illicit drugs Their current medications and supplements The patient's functional ability including ADLs,fall risks, home safety risks, cognitive, and hearing and visual impairment Diet and physical activities Evidence for depression or mood disorders  The patient's weight, height, BMI, and visual acuity have been recorded in the chart.  I have made referrals, counseling, and provided education to the patient based on review of the above and I have provided the patient with a written personalized care plan for preventive services.     Chakita Mcgraw A, MD   05/23/2015

## 2015-06-04 DIAGNOSIS — R69 Illness, unspecified: Secondary | ICD-10-CM | POA: Diagnosis not present

## 2015-06-14 ENCOUNTER — Other Ambulatory Visit: Payer: Self-pay

## 2015-06-14 DIAGNOSIS — Z1231 Encounter for screening mammogram for malignant neoplasm of breast: Secondary | ICD-10-CM

## 2015-06-22 DIAGNOSIS — J189 Pneumonia, unspecified organism: Secondary | ICD-10-CM | POA: Diagnosis not present

## 2015-06-22 LAB — PULMONARY FUNCTION TEST

## 2015-06-24 ENCOUNTER — Encounter: Payer: Self-pay | Admitting: Family Medicine

## 2015-06-24 ENCOUNTER — Ambulatory Visit (INDEPENDENT_AMBULATORY_CARE_PROVIDER_SITE_OTHER): Payer: Medicare HMO | Admitting: Family Medicine

## 2015-06-24 ENCOUNTER — Telehealth: Payer: Self-pay | Admitting: *Deleted

## 2015-06-24 VITALS — BP 138/74 | HR 88 | Temp 98.2°F | Ht 60.5 in | Wt 161.0 lb

## 2015-06-24 DIAGNOSIS — J181 Lobar pneumonia, unspecified organism: Secondary | ICD-10-CM

## 2015-06-24 DIAGNOSIS — I4891 Unspecified atrial fibrillation: Secondary | ICD-10-CM | POA: Diagnosis not present

## 2015-06-24 DIAGNOSIS — J189 Pneumonia, unspecified organism: Secondary | ICD-10-CM

## 2015-06-24 DIAGNOSIS — I480 Paroxysmal atrial fibrillation: Secondary | ICD-10-CM | POA: Insufficient documentation

## 2015-06-24 DIAGNOSIS — N3941 Urge incontinence: Secondary | ICD-10-CM

## 2015-06-24 MED ORDER — TOLTERODINE TARTRATE ER 2 MG PO CP24: 2.0000 mg | ORAL_CAPSULE | Freq: Every day | ORAL | Status: DC

## 2015-06-24 NOTE — Progress Notes (Signed)
Chief Complaint  Patient presents with  . Pneumonia    follow up on UC visit, diagnosed with right upper lobe pneumonia. Has no energy. SOB, does this need heart checkup? Has not taken bp meds since Sat due to low bp and rapid pulse. Also leaving for cruise from [place] to [place] 07/14/15 returning 08/16/15-is travel ok? Has rx's for travel from travel clinic and wants to see if they should fill based on her other meds.   . Med change    asking if she can switch from vesicare back to detrol-brought in pricing.    Patient went to [facility] on 2/218 and was diagnosed with pneumonia. CXR showed R apical upper lobe pulmonary infiltrate.  She was prescribed azithromycin (z-pak) and tessalon perles 100mg .  Cough had started 2/12.   Review of UC notes shows she was afebrile, with pulse of 130, BP 101/59.   Influenza tests were negative.  She reports that her she "is in touch with the world again"--reading, energy slightly better.   Had some low grade fevers last Wednesday, fever broke on Saturday night, afebrile since 2/19. Her cough has improved. She took tessalon last night and she had a good night. Phlegm is thick, yellow, getting better/lighter/thinner. She has had some shortness of breath  Leaves 3/12 for 3 week trip. She wants to make sure she is okay for the trip, and okay to take the meds prescribed (z-pak and malarone).    Asking for change in bladder meds due to cost.  Vesicare 5mg  costs $242 for 30 d Tolterodine ER 2mg  (generic for Detrol LA) will cost $298.50 for a 90 day supply at [facility] (didn't check with [facility]).  Asking to switch. Review of chart shows that she previously took Detrol LA 2mg , and was switched due to cost/formulary. Recalls it working similarly to [facility]. Review of formulary brought in by pt shows that Oxybutynin short acting is a Tier 2; Oxybutynin extended release and Vesicare were both tier 3 (but not affordable for Vesicare).  PMH, PSH, SH  reviewed  Outpatient Encounter Prescriptions as of 06/24/2015  Medication Sig Note  . allopurinol (ZYLOPRIM) 100 MG tablet TAKE 2 TABLETS (200 MG TOTAL) BY MOUTH DAILY.   Marland Kitchen aspirin 81 MG tablet Take 81 mg by mouth daily.     . calcium carbonate (TUMS - DOSED IN MG ELEMENTAL CALCIUM) 500 MG chewable tablet Chew 1 tablet by mouth as needed. Reported on 05/23/2015 10/30/2013: Only prn heartburn (rarely needs, because Prilosec controls reflux)  . Calcium Carbonate-Vitamin D (CALTRATE 600+D) 600-400 MG-UNIT per tablet Take 2 tablets by mouth daily. Takes one in the am everyday and Monday,wednesday and Friday take one at night also   . celecoxib (CELEBREX) 200 MG capsule TAKE 1 CAPSULE (200 MG TOTAL) BY MOUTH DAILY.   Marland Kitchen colchicine 0.6 MG tablet Take 2 tablets at onset of pain, then 1 tablet 1 hour later if needed. May then take twice daily if needed   . furosemide (LASIX) 20 MG tablet Take 0.5-1 tablets (10-20 mg total) by mouth daily as needed for fluid or edema. 05/23/2015: Rarely uses; swelling was worse in the heat  . Multiple Vitamins-Minerals (CENTRUM SILVER PO) Take 1 tablet by mouth daily.     Marland Kitchen omeprazole (PRILOSEC) 20 MG capsule Take 20 mg by mouth as needed. Reported on 05/23/2015 05/23/2015: Using prn, only about 2/week  . pravastatin (PRAVACHOL) 20 MG tablet Take 1 tablet (20 mg total) by mouth daily.   . [  DISCONTINUED] solifenacin (VESICARE) 5 MG tablet Take 1 tablet (5 mg total) by mouth daily.   Marland Kitchen acetaminophen (TYLENOL) 500 MG tablet Take 500 mg by mouth every 6 (six) hours as needed. Reported on 06/24/2015 10/30/2013: Uses only prn headache  . amLODipine (NORVASC) 5 MG tablet TAKE 1 TABLET (5 MG TOTAL) BY MOUTH DAILY. (Patient not taking: Reported on 06/24/2015) 06/24/2015: Hasn't taken for the last 2 days due to low blood pressure  . azithromycin (ZITHROMAX) 250 MG tablet Reported on 06/24/2015 06/24/2015: Received from: External Pharmacy  . lisinopril (PRINIVIL,ZESTRIL) 40 MG tablet TAKE 1 TABLET  BY MOUTH DAILY (Patient not taking: Reported on 06/24/2015) 06/24/2015: Hasn't taken in 2 days, due to low blood pressure  . tolterodine (DETROL LA) 2 MG 24 hr capsule Take 1 capsule (2 mg total) by mouth daily.    No facility-administered encounter medications on file as of 06/24/2015.   Allergies  Allergen Reactions  . Cephalexin Itching  . Ciprofloxacin Itching  . Diclofenac Nausea Only    Upset stomach  . Shellfish-Derived Products Nausea And Vomiting   ROS: fever resolved, no chills, no headaches, dizziness, ear pain, sore throat. +cough, improving, +mild shortness of breath. No nausea, vomiting, diarrhea, urinary complaints, bleeding, bruising, rash, mood changes or other concerns.   PHYSICAL EXAM: BP 138/74 mmHg  Pulse 88  Temp(Src) 98.2 F (36.8 C) (Tympanic)  Ht 5' 0.5" (1.537 m)  Wt 161 lb (73.029 kg)  BMI 30.91 kg/m2  SpO2 98% Pulse 125 and irregular on exam by MD  Pleasant, elderly female, in no distress, speaking easily in full sentences. No coughing during visit HEENT: PERRL, EOMI, conjunctiva and sclera are clear. TM's and EAC's normal. Nasal mucosa minimally edematous, no purulence. Sinuses nontender. OP is clear Heart: Tachycardic, irregularly irregular Lungs are clear, no wheezes, rales, ronchi Abdomen: soft, nontender, no organomegaly or mass Back: no CVA or spinal tenderness Extremities: no edema, normal pulses Skin: normal turgor, no lesions/rash Psych: normal mood, affect, hygiene and grooming  EKG: atrial fibrillation, rapid ventricular response.  ASSESSMENT/PLAN:  Atrial fibrillation with rapid ventricular response (HCC) - new onset, likely also present at [facility] visit based on VS. To be seen in afib clinic in the morning - Plan: EKG 12-Lead  Pneumonia of right upper lobe due to infectious organism - clinically improving on z-pak, complete course. will need f/u CXR 3-4 wks - Plan: DG Chest 2 View  Urge incontinence of urine - change back to generic Detrol  LA 2mg  for cost purposes. check with [facility] to see if less expensive, and transfer rx if less. risks/side effects reviewed - Plan: tolterodine (DETROL LA) 2 MG 24 hr capsule  Change from Vesicare to Detrol LA 2mg  for cost purposes.

## 2015-06-24 NOTE — Telephone Encounter (Signed)
Patient advised.

## 2015-06-24 NOTE — Telephone Encounter (Signed)
Patient's husband called and was unsure of what to do with Connie [name]'s bp meds. Remain off both, restart one, restart both? Please advise.

## 2015-06-24 NOTE — Telephone Encounter (Signed)
She can take the amlodipine this evening.  Hold the lisinopril. They will likely be changed tomorrow by the cardiologist. They will give more information tomorrow.

## 2015-06-24 NOTE — Patient Instructions (Signed)
Your pneumonia seems to be improving.  Complete the z-pak.  You have new onset atrial fibrillation.  We are scheduling you with the atrial fibrillation clinic for evaluation for tomorrow.  We switched your bladder medicine back to generic Detrol for cost purposes.  Check with [facility] to see if price is better than [facility] (and then just transfer the prescription if it is).  Atrial Fibrillation Atrial fibrillation is a type of irregular or rapid heartbeat (arrhythmia). In atrial fibrillation, the heart quivers continuously in a chaotic pattern. This occurs when parts of the heart receive disorganized signals that make the heart unable to pump blood normally. This can increase the risk for stroke, heart failure, and other heart-related conditions. There are different types of atrial fibrillation, including:  Paroxysmal atrial fibrillation. This type starts suddenly, and it usually stops on its own shortly after it starts.  Persistent atrial fibrillation. This type often lasts longer than a week. It may stop on its own or with treatment.  Long-lasting persistent atrial fibrillation. This type lasts longer than 12 months.  Permanent atrial fibrillation. This type does not go away. Talk with your health care provider to learn about the type of atrial fibrillation that you have. CAUSES This condition is caused by some heart-related conditions or procedures, including:  A heart attack.  Coronary artery disease.  Heart failure.  Heart valve conditions.  High blood pressure.  Inflammation of the sac that surrounds the heart (pericarditis).  Heart surgery.  Certain heart rhythm disorders, such as Wolf-Parkinson-White syndrome. Other causes include:  Pneumonia.  Obstructive sleep apnea.  Blockage of an artery in the lungs (pulmonary embolism, or PE).  Lung cancer.  Chronic lung disease.  Thyroid problems, especially if the thyroid is overactive  (hyperthyroidism).  Caffeine.  Excessive alcohol use or illegal drug use.  Use of some medicines, including certain decongestants and diet pills. Sometimes, the cause cannot be found. RISK FACTORS This condition is more likely to develop in:  People who are older in age.  People who smoke.  People who have diabetes mellitus.  People who are overweight (obese).  Athletes who exercise vigorously. SYMPTOMS Symptoms of this condition include:  A feeling that your heart is beating rapidly or irregularly.  A feeling of discomfort or pain in your chest.  Shortness of breath.  Sudden light-headedness or weakness.  Getting tired easily during exercise. In some cases, there are no symptoms. DIAGNOSIS Your health care provider may be able to detect atrial fibrillation when taking your pulse. If detected, this condition may be diagnosed with:  An electrocardiogram (ECG).  A Holter monitor test that records your heartbeat patterns over a 24-hour period.  Transthoracic echocardiogram (TTE) to evaluate how blood flows through your heart.  Transesophageal echocardiogram (TEE) to view more detailed images of your heart.  A stress test.  Imaging tests, such as a CT scan or chest X-ray.  Blood tests. TREATMENT The main goals of treatment are to prevent blood clots from forming and to keep your heart beating at a normal rate and rhythm. The type of treatment that you receive depends on many factors, such as your underlying medical conditions and how you feel when you are experiencing atrial fibrillation. This condition may be treated with:  Medicine to slow down the heart rate, bring the heart's rhythm back to normal, or prevent clots from forming.  Electrical cardioversion. This is a procedure that resets your heart's rhythm by delivering a controlled, low-energy shock to the heart through  your skin.  Different types of ablation, such as catheter ablation, catheter ablation with  pacemaker, or surgical ablation. These procedures destroy the heart tissues that send abnormal signals. When the pacemaker is used, it is placed under your skin to help your heart beat in a regular rhythm. HOME CARE INSTRUCTIONS  Take over-the counter and prescription medicines only as told by your health care provider.  If your health care provider prescribed a blood-thinning medicine (anticoagulant), take it exactly as told. Taking too much blood-thinning medicine can cause bleeding. If you do not take enough blood-thinning medicine, you will not have the protection that you need against stroke and other problems.  Do not use tobacco products, including cigarettes, chewing tobacco, and e-cigarettes. If you need help quitting, ask your health care provider.  If you have obstructive sleep apnea, manage your condition as told by your health care provider.  Do not drink alcohol.  Do not drink beverages that contain caffeine, such as coffee, soda, and tea.  Maintain a healthy weight. Do not use diet pills unless your health care provider approves. Diet pills may make heart problems worse.  Follow diet instructions as told by your health care provider.  Exercise regularly as told by your health care provider.  Keep all follow-up visits as told by your health care provider. This is important. PREVENTION  Avoid drinking beverages that contain caffeine or alcohol.  Avoid certain medicines, especially medicines that are used for breathing problems.  Avoid certain herbs and herbal medicines, such as those that contain ephedra or ginseng.  Do not use illegal drugs, such as cocaine and amphetamines.  Do not smoke.  Manage your high blood pressure. SEEK MEDICAL CARE IF:  You notice a change in the rate, rhythm, or strength of your heartbeat.  You are taking an anticoagulant and you notice increased bruising.  You tire more easily when you exercise or exert yourself. SEEK IMMEDIATE  MEDICAL CARE IF:  You have chest pain, abdominal pain, sweating, or weakness.  You feel nauseous.  You notice blood in your vomit, bowel movement, or urine.  You have shortness of breath.  You suddenly have swollen feet and ankles.  You feel dizzy.  You have sudden weakness or numbness of the face, arm, or leg, especially on one side of the body.  You have trouble speaking, trouble understanding, or both (aphasia).  Your face or your eyelid droops on one side. These symptoms may represent a serious problem that is an emergency. Do not wait to see if the symptoms will go away. Get medical help right away. Call your local emergency services (911 in the [place] Do not drive yourself to the hospital.   This information is not intended to replace advice given to you by your health care provider. Make sure you discuss any questions you have with your health care provider.   Document Released: 04/20/2005 Document Revised: 01/09/2015 Document Reviewed: 08/15/2014 Elsevier Interactive Patient Education [employer].

## 2015-06-25 ENCOUNTER — Ambulatory Visit (HOSPITAL_COMMUNITY)
Admission: RE | Admit: 2015-06-25 | Discharge: 2015-06-25 | Disposition: A | Discharge status: Home / Self Care | Payer: Medicare HMO | Source: Ambulatory Visit | Attending: Nurse Practitioner | Admitting: Nurse Practitioner

## 2015-06-25 ENCOUNTER — Encounter (HOSPITAL_COMMUNITY): Payer: Self-pay | Admitting: Nurse Practitioner

## 2015-06-25 VITALS — BP 142/78 | HR 125 | Ht 61.0 in | Wt 159.8 lb

## 2015-06-25 DIAGNOSIS — Z888 Allergy status to other drugs, medicaments and biological substances status: Secondary | ICD-10-CM | POA: Diagnosis not present

## 2015-06-25 DIAGNOSIS — I481 Persistent atrial fibrillation: Secondary | ICD-10-CM | POA: Insufficient documentation

## 2015-06-25 DIAGNOSIS — Z833 Family history of diabetes mellitus: Secondary | ICD-10-CM | POA: Insufficient documentation

## 2015-06-25 DIAGNOSIS — I10 Essential (primary) hypertension: Secondary | ICD-10-CM | POA: Diagnosis not present

## 2015-06-25 DIAGNOSIS — Z8249 Family history of ischemic heart disease and other diseases of the circulatory system: Secondary | ICD-10-CM | POA: Diagnosis not present

## 2015-06-25 DIAGNOSIS — H9190 Unspecified hearing loss, unspecified ear: Secondary | ICD-10-CM | POA: Diagnosis not present

## 2015-06-25 DIAGNOSIS — I4891 Unspecified atrial fibrillation: Secondary | ICD-10-CM

## 2015-06-25 DIAGNOSIS — J189 Pneumonia, unspecified organism: Secondary | ICD-10-CM | POA: Insufficient documentation

## 2015-06-25 DIAGNOSIS — Z881 Allergy status to other antibiotic agents status: Secondary | ICD-10-CM | POA: Insufficient documentation

## 2015-06-25 DIAGNOSIS — E785 Hyperlipidemia, unspecified: Secondary | ICD-10-CM | POA: Insufficient documentation

## 2015-06-25 DIAGNOSIS — Z853 Personal history of malignant neoplasm of breast: Secondary | ICD-10-CM | POA: Insufficient documentation

## 2015-06-25 DIAGNOSIS — M8580 Other specified disorders of bone density and structure, unspecified site: Secondary | ICD-10-CM | POA: Diagnosis not present

## 2015-06-25 DIAGNOSIS — K219 Gastro-esophageal reflux disease without esophagitis: Secondary | ICD-10-CM | POA: Insufficient documentation

## 2015-06-25 DIAGNOSIS — Z79899 Other long term (current) drug therapy: Secondary | ICD-10-CM | POA: Diagnosis not present

## 2015-06-25 MED ORDER — CELECOXIB 200 MG PO CAPS: 200.0000 mg | ORAL_CAPSULE | ORAL | Status: DC

## 2015-06-25 MED ORDER — RIVAROXABAN 15 MG PO TABS: 15.0000 mg | ORAL_TABLET | Freq: Every day | ORAL | Status: DC

## 2015-06-25 MED ORDER — OMEPRAZOLE 20 MG PO CPDR: 20.0000 mg | DELAYED_RELEASE_CAPSULE | Freq: Every day | ORAL | Status: DC

## 2015-06-25 MED ORDER — DILTIAZEM HCL ER COATED BEADS 120 MG PO CP24: 120.0000 mg | ORAL_CAPSULE | Freq: Every day | ORAL | Status: DC

## 2015-06-25 MED ORDER — LISINOPRIL 40 MG PO TABS: 20.0000 mg | ORAL_TABLET | Freq: Every day | ORAL | Status: DC

## 2015-06-25 NOTE — Patient Instructions (Signed)
Your physician has recommended you make the following change in your medication:  1)Stop Aspirin 2)Stop Amlodipine 3)Decrease lisinopril to 20mg  (1/2 tablet of 40mg ) once a day 4)Start Xarelto 15mg  - take one tablet daily at supper 5)Start Cardizem 120mg  once a day 6)Try to take celebrex every other day 7)Start Prilosec daily

## 2015-06-25 NOTE — Progress Notes (Signed)
Patient ID: THOMAS RHUDE, female   DOB: [birth year], 82 y.o.   MRN: [id number]     Primary Care Physician: Vikki Ports, MD Referring Physician: Dr. Rita Ohara   LIS SAVITT is a 82 y.o. female with a h/o HTN, DDD, GERD,s/p breast CA, that was dx recently with pneumonia and new onset afib. Pt's husband said that they usually walk a mile several times a week and they did walk last Monday  without any issues. The next day, pt developed URI symptoms and presented to UC on 2/18 and was diagnosed with pneumonia and had rvr at that time but was dx with afib with PCP on her visit yesterday. In the afib  clinic today, she has afib by ekg with v rate of 125 bpm. She is still coughing and on zpack. She is very fatigued. No PND, orthopnea or pedal edema. The husband supplies a lot of her history and there do hae a trip planned in 3 weeks to travel from [place] to [place], 3/12 to 4/14. Her BP has been running lower since pneumonia/afib dx.   She drinks 1-2 cups of tea a day, no tobacco, no alcohol, no snoring history. She had been exercising regularly until she got sick.   She denies bleeding tendencies or history or any type of abnormal bleeding.She does take celebrex daily and discussed with the blood thinner, she will require to reduce her risk of stroke, with chadsvasc score of at least 4, it could put her at risk of bleeding.She will require rate control at least initially. The husband also has afib and is on xarelto. They will require some type of  malaria drugs for their trip and he has already found out that [place] is compatible.  Today, she denies symptoms of palpitations, chest pain, shortness of breath, orthopnea, PND, lower extremity edema, dizziness, presyncope, syncope, or neurologic sequela. The patient is tolerating medications without difficulties and is otherwise without complaint today.   Past Medical History  Diagnosis Date  . Hypertension   . Hyperlipidemia   . Arthritis     OA  spine, hands  . DDD (degenerative disc disease), lumbar   . Adenomatous colon polyp 1/03  . Abnormal brain MRI 8/01    small hemorrhagic stroke and possible cavernous hemangioma (Dr. Erling Cruz)  . Osteopenia     (DEXA's done by Dr. Truddie Coco); prev took Fosamax.  No change in DEXA after off Fosamax x 2 years  . Postmenopausal bleeding 2000    benign EMB (Dr. Raphael Gibney)  . GERD (gastroesophageal reflux disease)   . Herpes zoster 2003  . Onychomycosis 2003, 2008    treated with Lamisil  . Breast cancer ([facility]) 2001    R breast (T3N1, ER/PR+, HER-2 +) s/p mastectomy, chemo and chest wall irradiation (Dr. Truddie Coco)  . Urge urinary incontinence   . Ovarian cyst 12/2008    right  . Hearing loss 2012    wears hearing aids   Past Surgical History  Procedure Laterality Date  . Tubal ligation    . Tonsillectomy    . Modified mastectomy and tram flap reconstruction  2001    R breast  . Tram flap removal      problems with flap after radiation  . Cholecystectomy, laparoscopic  1/05  . Colonoscopy w/ polypectomy  05/23/01    adenomatous polyp  . Colonoscopy  12/17/08    normal (Dr. Wynetta Emery)  . Cataract extraction  summer 2011    bilateral    Current Outpatient  Prescriptions  Medication Sig Dispense Refill  . acetaminophen (TYLENOL) 500 MG tablet Take 500 mg by mouth every 6 (six) hours as needed. Reported on 06/24/2015    . allopurinol (ZYLOPRIM) 100 MG tablet TAKE 2 TABLETS (200 MG TOTAL) BY MOUTH DAILY. 180 tablet 3  . azithromycin (ZITHROMAX) 250 MG tablet Reported on 06/24/2015    . calcium carbonate (TUMS - DOSED IN MG ELEMENTAL CALCIUM) 500 MG chewable tablet Chew 1 tablet by mouth as needed. Reported on 05/23/2015    . Calcium Carbonate-Vitamin D (CALTRATE 600+D) 600-400 MG-UNIT per tablet Take 2 tablets by mouth daily. Takes one in the am everyday and Monday,wednesday and Friday take one at night also    . celecoxib (CELEBREX) 200 MG capsule Take 1 capsule (200 mg total) by mouth every other day.  90 capsule 0  . colchicine 0.6 MG tablet Take 2 tablets at onset of pain, then 1 tablet 1 hour later if needed. May then take twice daily if needed 60 tablet 0  . diltiazem (CARDIZEM CD) 120 MG 24 hr capsule Take 1 capsule (120 mg total) by mouth daily. 30 capsule 6  . furosemide (LASIX) 20 MG tablet Take 0.5-1 tablets (10-20 mg total) by mouth daily as needed for fluid or edema. 90 tablet 0  . lisinopril (PRINIVIL,ZESTRIL) 40 MG tablet Take 0.5 tablets (20 mg total) by mouth daily. 90 tablet 0  . Multiple Vitamins-Minerals (CENTRUM SILVER PO) Take 1 tablet by mouth daily.      Marland Kitchen omeprazole (PRILOSEC) 20 MG capsule Take 1 capsule (20 mg total) by mouth daily. Reported on 05/23/2015    . pravastatin (PRAVACHOL) 20 MG tablet Take 1 tablet (20 mg total) by mouth daily. 90 tablet 1  . Rivaroxaban (XARELTO) 15 MG TABS tablet Take 1 tablet (15 mg total) by mouth daily with supper. 30 tablet 1  . tolterodine (DETROL LA) 2 MG 24 hr capsule Take 1 capsule (2 mg total) by mouth daily. 90 capsule 1  . [DISCONTINUED] solifenacin (VESICARE) 5 MG tablet Take 1 tablet (5 mg total) by mouth daily. 90 tablet 0   No current facility-administered medications for this encounter.    Allergies  Allergen Reactions  . Cephalexin Itching  . Ciprofloxacin Itching  . Diclofenac Nausea Only    Upset stomach  . Shellfish-Derived Products Nausea And Vomiting    Social History   Social History  . Marital Status: Married    Spouse Name: N/A  . Number of Children: 3  . Years of Education: N/A   Occupational History  . Not on file.   Social History Main Topics  . Smoking status: Never Smoker   . Smokeless tobacco: Never Used  . Alcohol Use: No  . Drug Use: No  . Sexual Activity: Not Currently     Comment: due to poor libido   Other Topics Concern  . Not on file   Social History Narrative   Lives with husband, cat.  1 daughter at [employer]; daughter in [place] (works in Conservator, museum/gallery at [employer]), son in  [place]. 8 grandchildren, 1 great-grandchild due 06/2015    Family History  Problem Relation Age of Onset  . Heart disease Mother   . Hypertension Mother   . Heart disease Father   . Hypertension Father   . Diabetes Brother   . Heart disease Brother   . Drug abuse Neg Hx   . Cancer Neg Hx     ROS- All systems are reviewed and negative  except as per the HPI above  Physical Exam: Filed Vitals:   06/25/15 0945  BP: 142/78  Pulse: 125  Height: _0  (1.549 m)  Weight: 159 lb 12.8 oz (72.485 kg)    GEN- The patient is well appearing, alert and oriented x 3 today.   Head- normocephalic, atraumatic Eyes-  Sclera clear, conjunctiva pink Ears- hearing intact Oropharynx- clear Neck- supple, no JVP Lymph- no cervical lymphadenopathy Lungs- Clear to ausculation bilaterally, normal work of breathing Heart- Rapid, iregular rate and rhythm, no murmurs, rubs or gallops, PMI not laterally displaced GI- soft, NT, ND, + BS Extremities- no clubbing, cyanosis, or edema MS- no significant deformity or atrophy Skin- no rash or lesion Psych- euthymic mood, full affect Neuro- strength and sensation are intact  EKG- afib with rvr at 125 bpm, septal infarct, age undetermined Epic records reviewed   Assessment and Plan: 1. New onset symptomatic persistent afib Right now it is hard to determine how symptomatic she is is with afib vrs  pneumonia   Continue treatment for pneumonia with zpack Will start cardizem 120 mg qd for rate control Stop amlodipine Cut lisinopril in half for now, to allow room with  for rate control drugs Will start xarelto 15 mg qd, creatine clearance calculated at 43.0 ml/min Celebrex daily a real concern for bleeding issues She says that she cannot do without the drug without significant discomfort and decrease in QOL, aware of bleeding concerns. Watch for signs of bleeding, black or red blood in stool. She will try celebrex every other day Already takes omeprazole prn  but will make it daily to help offset gastric irritation Echo F/u Thursday for effects of cardizem on rate control   Alayziah Tangeman C. Lariya Kinzie, [facility] [address] [place], [place] [address] [phone]

## 2015-06-26 ENCOUNTER — Ambulatory Visit (HOSPITAL_COMMUNITY)
Admission: RE | Admit: 2015-06-26 | Discharge: 2015-06-26 | Disposition: A | Discharge status: Home / Self Care | Payer: Medicare HMO | Source: Ambulatory Visit | Attending: Nurse Practitioner | Admitting: Nurse Practitioner

## 2015-06-26 DIAGNOSIS — I119 Hypertensive heart disease without heart failure: Secondary | ICD-10-CM | POA: Diagnosis not present

## 2015-06-26 DIAGNOSIS — I351 Nonrheumatic aortic (valve) insufficiency: Secondary | ICD-10-CM | POA: Diagnosis not present

## 2015-06-26 DIAGNOSIS — I313 Pericardial effusion (noninflammatory): Secondary | ICD-10-CM | POA: Diagnosis not present

## 2015-06-26 DIAGNOSIS — I4891 Unspecified atrial fibrillation: Secondary | ICD-10-CM | POA: Diagnosis not present

## 2015-06-26 DIAGNOSIS — E785 Hyperlipidemia, unspecified: Secondary | ICD-10-CM | POA: Insufficient documentation

## 2015-06-26 DIAGNOSIS — I340 Nonrheumatic mitral (valve) insufficiency: Secondary | ICD-10-CM | POA: Insufficient documentation

## 2015-06-26 NOTE — Progress Notes (Signed)
*  PRELIMINARY RESULTS* Echocardiogram 2D Echocardiogram has been performed.  Connie [name] 06/26/2015, 3:22 PM

## 2015-06-27 ENCOUNTER — Ambulatory Visit (HOSPITAL_COMMUNITY)
Admission: RE | Admit: 2015-06-27 | Discharge: 2015-06-27 | Disposition: A | Discharge status: Home / Self Care | Payer: Medicare HMO | Source: Ambulatory Visit | Attending: Nurse Practitioner | Admitting: Nurse Practitioner

## 2015-06-27 ENCOUNTER — Encounter (HOSPITAL_COMMUNITY): Payer: Self-pay | Admitting: Nurse Practitioner

## 2015-06-27 VITALS — BP 118/60 | HR 73 | Ht 61.0 in | Wt 160.6 lb

## 2015-06-27 DIAGNOSIS — I4891 Unspecified atrial fibrillation: Secondary | ICD-10-CM

## 2015-06-27 DIAGNOSIS — Z853 Personal history of malignant neoplasm of breast: Secondary | ICD-10-CM | POA: Insufficient documentation

## 2015-06-27 DIAGNOSIS — J189 Pneumonia, unspecified organism: Secondary | ICD-10-CM | POA: Insufficient documentation

## 2015-06-27 DIAGNOSIS — Z881 Allergy status to other antibiotic agents status: Secondary | ICD-10-CM | POA: Diagnosis not present

## 2015-06-27 DIAGNOSIS — I481 Persistent atrial fibrillation: Secondary | ICD-10-CM | POA: Diagnosis not present

## 2015-06-27 DIAGNOSIS — Z7902 Long term (current) use of antithrombotics/antiplatelets: Secondary | ICD-10-CM | POA: Insufficient documentation

## 2015-06-27 DIAGNOSIS — Z79899 Other long term (current) drug therapy: Secondary | ICD-10-CM | POA: Insufficient documentation

## 2015-06-27 DIAGNOSIS — Z8249 Family history of ischemic heart disease and other diseases of the circulatory system: Secondary | ICD-10-CM | POA: Insufficient documentation

## 2015-06-27 DIAGNOSIS — H9190 Unspecified hearing loss, unspecified ear: Secondary | ICD-10-CM | POA: Insufficient documentation

## 2015-06-27 DIAGNOSIS — Z833 Family history of diabetes mellitus: Secondary | ICD-10-CM | POA: Insufficient documentation

## 2015-06-27 DIAGNOSIS — K219 Gastro-esophageal reflux disease without esophagitis: Secondary | ICD-10-CM | POA: Diagnosis not present

## 2015-06-27 DIAGNOSIS — I10 Essential (primary) hypertension: Secondary | ICD-10-CM | POA: Insufficient documentation

## 2015-06-27 DIAGNOSIS — E785 Hyperlipidemia, unspecified: Secondary | ICD-10-CM | POA: Insufficient documentation

## 2015-06-27 NOTE — Progress Notes (Signed)
Patient ID: Connie [name], female   DOB: [birth year], 82 y.o.   MRN: [id number]     Primary Care Physician: Vikki Ports, MD Referring Physician: Dr. Rita Ohara   Connie [name] is a 81 y.o. female with a h/o HTN, DDD, GERD,s/p breast CA, that was dx recently with pneumonia and new onset afib. Pt's husband said that they usually walk a mile several times a week and they did walk last Monday  without any issues. The next day, pt developed URI symptoms and presented to UC on 2/18 and was diagnosed with pneumonia and had rvr at that time but was dx with afib with PCP on her visit yesterday. In the afib clinic, 2/21, she had afib by ekg with v rate of 125 bpm. She is still coughing and on zpack. She is very fatigued. No PND, orthopnea or pedal edema. The husband supplies a lot of her history and there do have a trip planned in 3 weeks to travel from [place] to [place], 3/12 to 4/14. Her BP has been running lower since pneumonia/afib dx.   She drinks 1-2 cups of tea a day, no tobacco, no alcohol, no snoring history. She had been exercising regularly until she got sick.   She denies bleeding tendencies or history or any type of abnormal bleeding.She does take celebrex daily and discussed with the blood thinner, she will require to reduce her risk of stroke, with chadsvasc score of at least 4, it could put her at risk of bleeding.She will require rate control at least initially. The husband also has afib and is on xarelto. They will require some type of  malaria drugs for their trip and he has already found out that [place] is compatible.  Returns 2/23 and is in SR. She is feeling some stronger. Tolerating blood thinner and has cut back on celebrex to every other day and so far doing ok with this .BP is good today with changes made.Echo reviewed with normal EF.  Today, she denies symptoms of palpitations, chest pain, shortness of breath, orthopnea, PND, lower extremity edema, dizziness, presyncope,  syncope, or neurologic sequela. The patient is tolerating medications without difficulties and is otherwise without complaint today.   Past Medical History  Diagnosis Date  . Hypertension   . Hyperlipidemia   . Arthritis     OA spine, hands  . DDD (degenerative disc disease), lumbar   . Adenomatous colon polyp 1/03  . Abnormal brain MRI 8/01    small hemorrhagic stroke and possible cavernous hemangioma (Dr. Erling Cruz)  . Osteopenia     (DEXA's done by Dr. Truddie Coco); prev took Fosamax.  No change in DEXA after off Fosamax x 2 years  . Postmenopausal bleeding 2000    benign EMB (Dr. Raphael Gibney)  . GERD (gastroesophageal reflux disease)   . Herpes zoster 2003  . Onychomycosis 2003, 2008    treated with Lamisil  . Breast cancer ([facility]) 2001    R breast (T3N1, ER/PR+, HER-2 +) s/p mastectomy, chemo and chest wall irradiation (Dr. Truddie Coco)  . Urge urinary incontinence   . Ovarian cyst 12/2008    right  . Hearing loss 2012    wears hearing aids   Past Surgical History  Procedure Laterality Date  . Tubal ligation    . Tonsillectomy    . Modified mastectomy and tram flap reconstruction  2001    R breast  . Tram flap removal      problems with flap after radiation  . Cholecystectomy,  laparoscopic  1/05  . Colonoscopy w/ polypectomy  05/23/01    adenomatous polyp  . Colonoscopy  12/17/08    normal (Dr. Wynetta Emery)  . Cataract extraction  summer 2011    bilateral    Current Outpatient Prescriptions  Medication Sig Dispense Refill  . acetaminophen (TYLENOL) 500 MG tablet Take 500 mg by mouth every 6 (six) hours as needed. Reported on 06/24/2015    . allopurinol (ZYLOPRIM) 100 MG tablet TAKE 2 TABLETS (200 MG TOTAL) BY MOUTH DAILY. 180 tablet 3  . azithromycin (ZITHROMAX) 250 MG tablet Reported on 06/24/2015    . calcium carbonate (TUMS - DOSED IN MG ELEMENTAL CALCIUM) 500 MG chewable tablet Chew 1 tablet by mouth as needed. Reported on 05/23/2015    . Calcium Carbonate-Vitamin D (CALTRATE 600+D)  600-400 MG-UNIT per tablet Take 2 tablets by mouth daily. Takes one in the am everyday and Monday,wednesday and Friday take one at night also    . celecoxib (CELEBREX) 200 MG capsule Take 1 capsule (200 mg total) by mouth every other day. 90 capsule 0  . colchicine 0.6 MG tablet Take 2 tablets at onset of pain, then 1 tablet 1 hour later if needed. May then take twice daily if needed 60 tablet 0  . diltiazem (CARDIZEM CD) 120 MG 24 hr capsule Take 1 capsule (120 mg total) by mouth daily. 30 capsule 6  . furosemide (LASIX) 20 MG tablet Take 0.5-1 tablets (10-20 mg total) by mouth daily as needed for fluid or edema. 90 tablet 0  . lisinopril (PRINIVIL,ZESTRIL) 40 MG tablet Take 0.5 tablets (20 mg total) by mouth daily. 90 tablet 0  . Multiple Vitamins-Minerals (CENTRUM SILVER PO) Take 1 tablet by mouth daily.      Marland Kitchen omeprazole (PRILOSEC) 20 MG capsule Take 1 capsule (20 mg total) by mouth daily. Reported on 05/23/2015    . pravastatin (PRAVACHOL) 20 MG tablet Take 1 tablet (20 mg total) by mouth daily. 90 tablet 1  . Rivaroxaban (XARELTO) 15 MG TABS tablet Take 1 tablet (15 mg total) by mouth daily with supper. 30 tablet 1  . tolterodine (DETROL LA) 2 MG 24 hr capsule Take 1 capsule (2 mg total) by mouth daily. 90 capsule 1  . [DISCONTINUED] solifenacin (VESICARE) 5 MG tablet Take 1 tablet (5 mg total) by mouth daily. 90 tablet 0   No current facility-administered medications for this encounter.    Allergies  Allergen Reactions  . Cephalexin Itching  . Ciprofloxacin Itching  . Diclofenac Nausea Only    Upset stomach  . Shellfish-Derived Products Nausea And Vomiting    Social History   Social History  . Marital Status: Married    Spouse Name: N/A  . Number of Children: 3  . Years of Education: N/A   Occupational History  . Not on file.   Social History Main Topics  . Smoking status: Never Smoker   . Smokeless tobacco: Never Used  . Alcohol Use: No  . Drug Use: No  . Sexual  Activity: Not Currently     Comment: due to poor libido   Other Topics Concern  . Not on file   Social History Narrative   Lives with husband, cat.  1 daughter at [employer]; daughter in [place] (works in Conservator, museum/gallery at [employer]), son in [place]. 8 grandchildren, 1 great-grandchild due 06/2015    Family History  Problem Relation Age of Onset  . Heart disease Mother   . Hypertension Mother   . Heart  disease Father   . Hypertension Father   . Diabetes Brother   . Heart disease Brother   . Drug abuse Neg Hx   . Cancer Neg Hx     ROS- All systems are reviewed and negative except as per the HPI above  Physical Exam: Filed Vitals:   06/27/15 1525  BP: 118/60  Pulse: 73  Height: '5\' 1"'  (1.549 m)  Weight: 160 lb 9.6 oz (72.848 kg)    GEN- The patient is well appearing, alert and oriented x 3 today.   Head- normocephalic, atraumatic Eyes-  Sclera clear, conjunctiva pink Ears- hearing intact Oropharynx- clear Neck- supple, no JVP Lymph- no cervical lymphadenopathy Lungs- Clear to ausculation bilaterally, normal work of breathing Heart- Regular rate and rhythm, no murmurs, rubs or gallops, PMI not laterally displaced GI- soft, NT, ND, + BS Extremities- no clubbing, cyanosis, or edema MS- no significant deformity or atrophy Skin- no rash or lesion Psych- euthymic mood, full affect Neuro- strength and sensation are intact  EKG-SR with PAC's, v rate 73 bpm, pr int 180 ms, qrs int 62 ms, qtc 445 ms Epic records reviewed  Echo-- Left ventricle: The cavity size was normal. Wall thickness was increased in a pattern of mild LVH. Systolic function was normal. The estimated ejection fraction was in the range of 50% to 55%. Wall motion was normal; there were no regional wall motion abnormalities. - Aortic valve: Trileaflet; mildly thickened, mildly calcified leaflets. There was mild regurgitation. - Mitral valve: Calcified annulus. There was mild regurgitation. -  Pericardium, extracardiac: A trivial pericardial effusion was identified posterior to the heart.  Assessment and Plan: 1. New onset symptomatic persistent afib Now back in SR Continue treatment for pneumonia with zpack Continue cardizem 120 mg qd for rate control Continue off amlodipine Continue lisinopril  20 mg qd  Continue xarelto 15 mg qd, creatine clearance calculated at 43.0 ml/min Celebrex daily a real concern for bleeding issues Continue celebrex qod with PPI  F/u in one week    Butch Penny C. Blu Mcglaun, [facility] [address] [place], [place] [address] [phone]

## 2015-07-04 ENCOUNTER — Ambulatory Visit (HOSPITAL_COMMUNITY)
Admission: RE | Admit: 2015-07-04 | Discharge: 2015-07-04 | Disposition: A | Discharge status: Home / Self Care | Payer: Medicare HMO | Source: Ambulatory Visit | Attending: Nurse Practitioner | Admitting: Nurse Practitioner

## 2015-07-04 VITALS — BP 128/70 | HR 75 | Ht 61.0 in | Wt 161.8 lb

## 2015-07-04 DIAGNOSIS — R05 Cough: Secondary | ICD-10-CM | POA: Diagnosis not present

## 2015-07-04 DIAGNOSIS — H9190 Unspecified hearing loss, unspecified ear: Secondary | ICD-10-CM | POA: Diagnosis not present

## 2015-07-04 DIAGNOSIS — E785 Hyperlipidemia, unspecified: Secondary | ICD-10-CM | POA: Insufficient documentation

## 2015-07-04 DIAGNOSIS — Z79899 Other long term (current) drug therapy: Secondary | ICD-10-CM | POA: Diagnosis not present

## 2015-07-04 DIAGNOSIS — Z7902 Long term (current) use of antithrombotics/antiplatelets: Secondary | ICD-10-CM | POA: Diagnosis not present

## 2015-07-04 DIAGNOSIS — Z09 Encounter for follow-up examination after completed treatment for conditions other than malignant neoplasm: Secondary | ICD-10-CM

## 2015-07-04 DIAGNOSIS — Z881 Allergy status to other antibiotic agents status: Secondary | ICD-10-CM | POA: Diagnosis not present

## 2015-07-04 DIAGNOSIS — M8580 Other specified disorders of bone density and structure, unspecified site: Secondary | ICD-10-CM | POA: Insufficient documentation

## 2015-07-04 DIAGNOSIS — Z8249 Family history of ischemic heart disease and other diseases of the circulatory system: Secondary | ICD-10-CM | POA: Insufficient documentation

## 2015-07-04 DIAGNOSIS — Z833 Family history of diabetes mellitus: Secondary | ICD-10-CM | POA: Diagnosis not present

## 2015-07-04 DIAGNOSIS — Z9221 Personal history of antineoplastic chemotherapy: Secondary | ICD-10-CM | POA: Diagnosis not present

## 2015-07-04 DIAGNOSIS — I10 Essential (primary) hypertension: Secondary | ICD-10-CM | POA: Insufficient documentation

## 2015-07-04 DIAGNOSIS — Z853 Personal history of malignant neoplasm of breast: Secondary | ICD-10-CM | POA: Diagnosis not present

## 2015-07-04 DIAGNOSIS — Z888 Allergy status to other drugs, medicaments and biological substances status: Secondary | ICD-10-CM | POA: Insufficient documentation

## 2015-07-04 DIAGNOSIS — I480 Paroxysmal atrial fibrillation: Secondary | ICD-10-CM

## 2015-07-04 DIAGNOSIS — I4891 Unspecified atrial fibrillation: Secondary | ICD-10-CM | POA: Diagnosis not present

## 2015-07-04 DIAGNOSIS — Z923 Personal history of irradiation: Secondary | ICD-10-CM | POA: Diagnosis not present

## 2015-07-04 DIAGNOSIS — K219 Gastro-esophageal reflux disease without esophagitis: Secondary | ICD-10-CM | POA: Insufficient documentation

## 2015-07-04 MED ORDER — RIVAROXABAN 15 MG PO TABS: 15.0000 mg | ORAL_TABLET | Freq: Every day | ORAL | Status: DC

## 2015-07-04 MED ORDER — DILTIAZEM HCL 30 MG PO TABS: ORAL_TABLET | ORAL | Status: DC

## 2015-07-04 MED ORDER — DILTIAZEM HCL ER COATED BEADS 120 MG PO CP24: 120.0000 mg | ORAL_CAPSULE | Freq: Every day | ORAL | Status: DC

## 2015-07-04 NOTE — Patient Instructions (Signed)
Your physician has recommended you make the following change in your medication:  1)Cardizem 30mg  -- take 1 tablet by mouth every 4 hours AS NEEDED for heart rate >100 as long as blood pressure >100.

## 2015-07-04 NOTE — Progress Notes (Signed)
Patient ID: [name], female   DOB: 1927-07-05, 82 y.o.   MRN: [id number]     Primary Care Physician: Vikki Ports, MD Referring Physician: Dr. Rita Ohara   Connie [name] is a 82 y.o. female with a h/o HTN, DDD, GERD,s/p breast CA, that was dx recently with pneumonia and new onset afib. Pt's husband said that they usually walk a mile several times a week and they did walk last Monday  without any issues. The next day, pt developed URI symptoms and presented to UC on 2/18 and was diagnosed with pneumonia and had rvr at that time but was dx with afib with PCP on her visit yesterday. In the afib clinic today, she has afib by ekg with v rate of 125 bpm. She is still coughing and on zpack. She is very fatigued. No PND, orthopnea or pedal edema. The husband supplies a lot of her history and they do have a trip planned in 3 weeks to travel from [place] to [place], 3/12 to 4/14. Her BP has been running lower since pneumonia/afib dx.   She drinks 1-2 cups of tea a day, no tobacco, no alcohol, no snoring history. She had been exercising regularly until she got sick.   She denies bleeding tendencies or history or any type of abnormal bleeding.She does take celebrex daily and discussed with the blood thinner,to reduce her risk of stroke, with chadsvasc score of at least 4, will like to start DOAC.With daily NSAIDS,  it could put her at risk of bleeding. She will try to reduce use of celebrex.She will require rate control at least initially. The husband also has afib and is on xarelto. They will require some type of  malaria drugs for their trip and he has already found out that [place] is compatible.  She returns to afib clinic today 3/2 and has now been in SR x last two ekg's, but her EKG monitor this am showed the irregular heart rate sign. But it was short lived and she is back in rhythm. She is still coughing but feeling stronger. She  will require a f/u visit with her PCP and the pt states that Dr.  Tomi Bamberger wanted a f/u cxr. While she is here will obtain for pt and will forward to Dr. Tomi Bamberger. After pt hears for sure that her pneumonia is resolving and Dr. Tomi Bamberger gives them OK to travel, they will make their final decision as if they will follow with their plans to go to [place].  Today, she denies symptoms of palpitations, chest pain, shortness of breath, orthopnea, PND, lower extremity edema, dizziness, presyncope, syncope, or neurologic sequela. The patient is tolerating medications without difficulties and is otherwise without complaint today.   Past Medical History  Diagnosis Date  . Hypertension   . Hyperlipidemia   . Arthritis     OA spine, hands  . DDD (degenerative disc disease), lumbar   . Adenomatous colon polyp 1/03  . Abnormal brain MRI 8/01    small hemorrhagic stroke and possible cavernous hemangioma (Dr. Erling Cruz)  . Osteopenia     (DEXA's done by Dr. Truddie Coco); prev took Fosamax.  No change in DEXA after off Fosamax x 2 years  . Postmenopausal bleeding 2000    benign EMB (Dr. Raphael Gibney)  . GERD (gastroesophageal reflux disease)   . Herpes zoster 2003  . Onychomycosis 2003, 2008    treated with Lamisil  . Breast cancer ([facility]) 2001    R breast (T3N1, ER/PR+, HER-2 +)  s/p mastectomy, chemo and chest wall irradiation (Dr. Truddie Coco)  . Urge urinary incontinence   . Ovarian cyst 12/2008    right  . Hearing loss 2012    wears hearing aids   Past Surgical History  Procedure Laterality Date  . Tubal ligation    . Tonsillectomy    . Modified mastectomy and tram flap reconstruction  2001    R breast  . Tram flap removal      problems with flap after radiation  . Cholecystectomy, laparoscopic  1/05  . Colonoscopy w/ polypectomy  05/23/01    adenomatous polyp  . Colonoscopy  12/17/08    normal (Dr. Wynetta Emery)  . Cataract extraction  summer 2011    bilateral    Current Outpatient Prescriptions  Medication Sig Dispense Refill  . acetaminophen (TYLENOL) 500 MG tablet Take 500 mg by  mouth every 6 (six) hours as needed. Reported on 06/24/2015    . allopurinol (ZYLOPRIM) 100 MG tablet TAKE 2 TABLETS (200 MG TOTAL) BY MOUTH DAILY. 180 tablet 3  . azithromycin (ZITHROMAX) 250 MG tablet Reported on 06/24/2015    . calcium carbonate (TUMS - DOSED IN MG ELEMENTAL CALCIUM) 500 MG chewable tablet Chew 1 tablet by mouth as needed. Reported on 05/23/2015    . Calcium Carbonate-Vitamin D (CALTRATE 600+D) 600-400 MG-UNIT per tablet Take 2 tablets by mouth daily. Takes one in the am everyday and Monday,wednesday and Friday take one at night also    . celecoxib (CELEBREX) 200 MG capsule Take 1 capsule (200 mg total) by mouth every other day. 90 capsule 0  . colchicine 0.6 MG tablet Take 2 tablets at onset of pain, then 1 tablet 1 hour later if needed. May then take twice daily if needed 60 tablet 0  . diltiazem (CARDIZEM CD) 120 MG 24 hr capsule Take 1 capsule (120 mg total) by mouth daily. 30 capsule 6  . furosemide (LASIX) 20 MG tablet Take 0.5-1 tablets (10-20 mg total) by mouth daily as needed for fluid or edema. 90 tablet 0  . lisinopril (PRINIVIL,ZESTRIL) 40 MG tablet Take 0.5 tablets (20 mg total) by mouth daily. 90 tablet 0  . Multiple Vitamins-Minerals (CENTRUM SILVER PO) Take 1 tablet by mouth daily.      Marland Kitchen omeprazole (PRILOSEC) 20 MG capsule Take 1 capsule (20 mg total) by mouth daily. Reported on 05/23/2015    . pravastatin (PRAVACHOL) 20 MG tablet Take 1 tablet (20 mg total) by mouth daily. 90 tablet 1  . Rivaroxaban (XARELTO) 15 MG TABS tablet Take 1 tablet (15 mg total) by mouth daily with supper. 30 tablet 1  . tolterodine (DETROL LA) 2 MG 24 hr capsule Take 1 capsule (2 mg total) by mouth daily. 90 capsule 1  . [DISCONTINUED] solifenacin (VESICARE) 5 MG tablet Take 1 tablet (5 mg total) by mouth daily. 90 tablet 0   No current facility-administered medications for this encounter.    Allergies  Allergen Reactions  . Cephalexin Itching  . Ciprofloxacin Itching  . Diclofenac  Nausea Only    Upset stomach  . Shellfish-Derived Products Nausea And Vomiting    Social History   Social History  . Marital Status: Married    Spouse Name: N/A  . Number of Children: 3  . Years of Education: N/A   Occupational History  . Not on file.   Social History Main Topics  . Smoking status: Never Smoker   . Smokeless tobacco: Never Used  . Alcohol Use: No  .  Drug Use: No  . Sexual Activity: Not Currently     Comment: due to poor libido   Other Topics Concern  . Not on file   Social History Narrative   Lives with husband, cat.  1 daughter at [employer]; daughter in [place] (works in Conservator, museum/gallery at [employer]), son in [place]. 8 grandchildren, 1 great-grandchild due 06/2015    Family History  Problem Relation Age of Onset  . Heart disease Mother   . Hypertension Mother   . Heart disease Father   . Hypertension Father   . Diabetes Brother   . Heart disease Brother   . Drug abuse Neg Hx   . Cancer Neg Hx     ROS- All systems are reviewed and negative except as per the HPI above  Physical Exam: Filed Vitals:   07/04/15 1514  BP: 128/70  Pulse: 75  Height: _0  (1.549 m)  Weight: 161 lb 12.8 oz (73.392 kg)    GEN- The patient is well appearing, alert and oriented x 3 today.   Head- normocephalic, atraumatic Eyes-  Sclera clear, conjunctiva pink Ears- hearing intact Oropharynx- clear Neck- supple, no JVP Lymph- no cervical lymphadenopathy Lungs- Clear to ausculation bilaterally, normal work of breathing Heart- Rapid, iregular rate and rhythm, no murmurs, rubs or gallops, PMI not laterally displaced GI- soft, NT, ND, + BS Extremities- no clubbing, cyanosis, or edema MS- no significant deformity or atrophy Skin- no rash or lesion Psych- euthymic mood, full affect Neuro- strength and sensation are intact  EKG- afib with rvr at 125 bpm, septal infarct, age undetermined Epic records reviewed   Assessment and Plan: 1. afib For most part is back in  SR Continue cardizem 120 mg qd Continue xarelto at 15 mg  for creat cl calculated at 43.  Will give her 30 mg diltiazem as prn drug for breakthrough afib  If BP is over 100 and if HR is over 100 especially while traveling. CXR  as a convenience  to pt to f/u pneumonia and will be forwarded to Dr. Tomi Bamberger  She will be made a f/u appointment with Dr. Tamala Julian in May Afib clinic as needed  Geroge Baseman. Teale Goodgame, [facility] [address] [place], [place] [address] [phone]

## 2015-07-09 ENCOUNTER — Ambulatory Visit: Payer: Medicare HMO

## 2015-07-09 ENCOUNTER — Other Ambulatory Visit: Payer: Medicare HMO

## 2015-07-10 ENCOUNTER — Encounter: Payer: Self-pay | Admitting: Family Medicine

## 2015-07-10 ENCOUNTER — Ambulatory Visit (INDEPENDENT_AMBULATORY_CARE_PROVIDER_SITE_OTHER): Payer: Medicare HMO | Admitting: Family Medicine

## 2015-07-10 VITALS — BP 148/62 | HR 76 | Temp 98.2°F | Ht 60.5 in | Wt 162.0 lb

## 2015-07-10 DIAGNOSIS — I4891 Unspecified atrial fibrillation: Secondary | ICD-10-CM | POA: Diagnosis not present

## 2015-07-10 DIAGNOSIS — R05 Cough: Secondary | ICD-10-CM | POA: Diagnosis not present

## 2015-07-10 DIAGNOSIS — R059 Cough, unspecified: Secondary | ICD-10-CM

## 2015-07-10 DIAGNOSIS — I10 Essential (primary) hypertension: Secondary | ICD-10-CM | POA: Diagnosis not present

## 2015-07-10 NOTE — Progress Notes (Signed)
Chief Complaint  Patient presents with  . Follow-up    on A Fib. Does feel so much better, still has cough. Would like a refill on tessalon to take to [place] with her. Also wants to know if there is an OTC sleep aid for the plane.    Seen 2/20 and diagnosed with atrial fibrillation.  She has been seen in afib clinic. meds were changed (see below).  She spontaneously converted to NSR. She was started on xarelto 15mg  daily, stopped the aspirin.  Denies any bleeding, bruising. She decreased lisinopril to 1/2 tablet, changed amlodipine to cardizem.  She has short-acting cardizem also on hand for prn use. She originally saw me to follow up on pneumonia diagnosed at [facility], at which time she was found to be in atrial fib.  She completed her antibiotics, and she is feeling much better overall.  BP at home running 103-133/62-72 (once 147/72, yesterday), pulse 73-83.  Once the monitor detected it was out of rhythm, pulse was 94.  Was in NSR at the afib clinic later that day.  Denies any side effects to medication. Energy is improving, not back to baseline yet. Cough is much improved.  Still has some residual cough, sometimes gets slight yellow phlegm up.  Cough is mostly dry. She has some drainage. She is leaving for [place] soon, and is concerned about sleeping on the plane, and meds for cough.  Has 8 tessalon left.  Concerned about bringing liquids on plane.   She had follow-up CXR (to f/u on the pneumonia diagnosed at [facility]). IMPRESSION: COPD changes with RIGHT upper lobe scarring. Minimal enlargement of cardiac silhouette. No acute abnormalities.  Review of chart--R upper lobe scarring and COPD changes were also noted on CXR done in 2008  PMH, [place], [place] reviewed.  Outpatient Encounter Prescriptions as of 07/10/2015  Medication Sig Note  . allopurinol (ZYLOPRIM) 100 MG tablet TAKE 2 TABLETS (200 MG TOTAL) BY MOUTH DAILY.   . benzonatate (TESSALON) 100 MG capsule Take 100 mg by mouth 3  (three) times daily as needed for cough.   . Calcium Carbonate-Vitamin D (CALTRATE 600+D) 600-400 MG-UNIT per tablet Take 2 tablets by mouth daily. Takes one in the am everyday and Monday,wednesday and Friday take one at night also   . celecoxib (CELEBREX) 200 MG capsule Take 1 capsule (200 mg total) by mouth every other day.   . diltiazem (CARDIZEM CD) 120 MG 24 hr capsule Take 1 capsule (120 mg total) by mouth daily.   Marland Kitchen lisinopril (PRINIVIL,ZESTRIL) 40 MG tablet Take 0.5 tablets (20 mg total) by mouth daily.   . Multiple Vitamins-Minerals (CENTRUM SILVER PO) Take 1 tablet by mouth daily.     Marland Kitchen omeprazole (PRILOSEC) 20 MG capsule Take 1 capsule (20 mg total) by mouth daily. Reported on 05/23/2015   . pravastatin (PRAVACHOL) 20 MG tablet Take 1 tablet (20 mg total) by mouth daily.   . Rivaroxaban (XARELTO) 15 MG TABS tablet Take 1 tablet (15 mg total) by mouth daily with supper.   . tolterodine (DETROL LA) 2 MG 24 hr capsule Take 1 capsule (2 mg total) by mouth daily.   Marland Kitchen acetaminophen (TYLENOL) 500 MG tablet Take 500 mg by mouth every 6 (six) hours as needed. Reported on 07/10/2015 10/30/2013: Uses only prn headache  . atovaquone-proguanil (MALARONE) 250-100 MG TABS tablet Reported on 07/10/2015 07/10/2015: Received from: External Pharmacy  . azithromycin (ZITHROMAX) 250 MG tablet Reported on 07/10/2015 06/24/2015: Received from: External Pharmacy  .  calcium carbonate (TUMS - DOSED IN MG ELEMENTAL CALCIUM) 500 MG chewable tablet Chew 1 tablet by mouth as needed. Reported on 07/10/2015 10/30/2013: Only prn heartburn (rarely needs, because Prilosec controls reflux)  . colchicine 0.6 MG tablet Take 2 tablets at onset of pain, then 1 tablet 1 hour later if needed. May then take twice daily if needed (Patient not taking: Reported on 07/10/2015)   . diltiazem (CARDIZEM) 30 MG tablet Cardizem 30mg  -- take 1 tablet by mouth every 4 hours AS NEEDED for heart rate >100 as long as blood pressure >100. (Patient not taking:  Reported on 07/10/2015)   . furosemide (LASIX) 20 MG tablet Take 0.5-1 tablets (10-20 mg total) by mouth daily as needed for fluid or edema. (Patient not taking: Reported on 07/10/2015) 05/23/2015: Rarely uses; swelling was worse in the heat  . [DISCONTINUED] azithromycin (ZITHROMAX) 500 MG tablet  07/10/2015: Received from: External Pharmacy   No facility-administered encounter medications on file as of 07/10/2015.   Allergies  Allergen Reactions  . Cephalexin Itching  . Ciprofloxacin Itching  . Diclofenac Nausea Only    Upset stomach  . Shellfish-Derived Products Nausea And Vomiting   ROS: no fever, chills, headaches, chest pain, shortness of breath, nausea, vomiting, diarrhea. +residual cough as per HPI. No sinus pain, other URI symptoms. No bleeding, bruising, rash.  PHYSICAL EXAM: BP 160/72 mmHg  Pulse 76  Temp(Src) 98.2 F (36.8 C) (Tympanic)  Ht 5' 0.5" (1.537 m)  Wt 162 lb (73.483 kg)  BMI 31.11 kg/m2  148/62 on repeat by MD, [place]  Well appearing, pleasant, talkative female in no distress, accompanied by her husband HEENT: PERRL, EOMI, conjunctiva and sclera clear. Nasal mucosa mildly edematous, OP is clear ,sinuses nontender Neck: no lymphadenopathy or mass Heart: regular rate and rhythm Lungs: clear bilaterally Extremities: no edema Neuro: alert and oriented. Cranial nerves intact, normal strength, gait Psych: normal mood, affect, hygiene and grooming  ASSESSMENT/PLAN:  Cough - s/p treatment for pneumonia. f/u CXR shows scarring and COPD changes, unchanged form prior. clinically resolved. mucinex DM or tessalon prn  Essential hypertension, benign - borderline, better at home. continue to monitor. May need to increase lisinopril dose if persistently elevated  Atrial fibrillation with rapid ventricular response (HCC) - rate is now well controlled with cardizem. continue anticoagulation  25-30 min visit, more than 1/2 spent counseling.  Keep an eye on the blood pressure.  If  it is persistently >140, then we should adjust your medications.  I would suggest we change the lisinopril, as long as your pulse remains in the normal range.  But do not do this without contacting either Butch Penny or me. I don't recommend you making any changes while you are on your trip, unless having BP's >160's and having headaches.  Melatonin may help you sleep and help with jet lag. You can use benadryl (active ingredient is diphenhydramine, which is in most OTC sleep aids, Z-quil, Simply Sleep)  For cough--I recommend you bring Mucinex-DM.  This has a cough suppressant and expectorant (and is a tablet.  You can use the tessalon on the plane, if needed.  Per afib, due to f/u with Dr. Tamala Julian in May F/u here as scheduled in July, sooner prn

## 2015-07-10 NOTE — Patient Instructions (Signed)
  Keep an eye on the blood pressure.  If it is persistently >140, then we should adjust your medications.  I would suggest we change the lisinopril, as long as your pulse remains in the normal range.  But do not do this without contacting either Butch Penny or me. I don't recommend you making any changes while you are on your trip, unless having BP's >160's and having headaches.  Melatonin may help you sleep and help with jet lag. You can use benadryl (active ingredient is diphenhydramine, which is in most OTC sleep aids, Z-quil, Simply Sleep)  For cough--I recommend you bring Mucinex-DM.  This has a cough suppressant and expectorant (and is a tablet.  You can use the tessalon on the plane, if needed.  Per atrial fibrillation clinic, you should be scheduling a follow-up visit with Dr. Tamala Julian for May.

## 2015-08-20 ENCOUNTER — Encounter: Payer: Self-pay | Admitting: Family Medicine

## 2015-09-03 ENCOUNTER — Ambulatory Visit
Admission: RE | Admit: 2015-09-03 | Discharge: 2015-09-03 | Disposition: A | Discharge status: Home / Self Care | Payer: Medicare HMO | Source: Ambulatory Visit

## 2015-09-03 ENCOUNTER — Ambulatory Visit
Admission: RE | Admit: 2015-09-03 | Discharge: 2015-09-03 | Disposition: A | Discharge status: Home / Self Care | Payer: Medicare HMO | Source: Ambulatory Visit | Attending: Family Medicine | Admitting: Family Medicine

## 2015-09-03 DIAGNOSIS — Z780 Asymptomatic menopausal state: Secondary | ICD-10-CM | POA: Diagnosis not present

## 2015-09-03 DIAGNOSIS — Z853 Personal history of malignant neoplasm of breast: Secondary | ICD-10-CM

## 2015-09-03 DIAGNOSIS — Z1231 Encounter for screening mammogram for malignant neoplasm of breast: Secondary | ICD-10-CM | POA: Diagnosis not present

## 2015-09-03 DIAGNOSIS — M85852 Other specified disorders of bone density and structure, left thigh: Secondary | ICD-10-CM | POA: Diagnosis not present

## 2015-09-03 DIAGNOSIS — M8580 Other specified disorders of bone density and structure, unspecified site: Secondary | ICD-10-CM

## 2015-10-01 DIAGNOSIS — R69 Illness, unspecified: Secondary | ICD-10-CM | POA: Diagnosis not present

## 2015-10-09 ENCOUNTER — Other Ambulatory Visit: Payer: Self-pay | Admitting: Family Medicine

## 2015-10-09 NOTE — Telephone Encounter (Signed)
She really shouldn't be taking this along with xarelto, due to increased risk of bleeding.  It looks like she was told in March in coumadin clinic to not take it daily. Deny this rx, and have her discuss with Dr. Terrill Mohr has appointment with him tomorrow. (it was last filled for 90d 3 months ago, so appears to be taking it daily still, unless on auto refill).

## 2015-10-09 NOTE — Telephone Encounter (Signed)
Spoke with patient and she will talk to [place] tomorrow.

## 2015-10-09 NOTE — Progress Notes (Signed)
Cardiology Office Note    Date:  10/10/2015   ID:  Connie [name], DOB 1928/02/04, MRN [id number]  PCP:  Vikki Ports, MD  Cardiologist: Sinclair Grooms, MD   Chief Complaint  Patient presents with  . Atrial Fibrillation    History of Present Illness:  Connie [name] is a 82 y.o. female for follow-up of paroxysmal Atrial Fibrillation. Also has a history of hypertension and hyperlipidemia.  The patient was diagnosed with paroxysmal A. fib in February 2017 all suffering with an upper respiratory infection. This was treated and atrial fibrillation spontaneously converted on oral diltiazem. Since February the patient believes she has had one additional episode of atrial fibrillation. Her documentation is that while monitoring her blood pressure she had a heart rate above 110 bpm which is very unusual for her. This went on for several hours before resolving. She did not feel bad in any way.  Atrial fibrillation was managed in the A. fib clinic. Amlodipine was switched to diltiazem. Xarelto 50 mg daily was started. She has had no bleeding complications.  Her primary physician is Dr. Tomi Bamberger. The patient has an inflammatory joint problem and takes Celebrex. They both have concern about using anticoagulation with Celebrex. They want my opinion.   Past Medical History  Diagnosis Date  . Hypertension   . Hyperlipidemia   . Arthritis     OA spine, hands  . DDD (degenerative disc disease), lumbar   . Adenomatous colon polyp 1/03  . Abnormal brain MRI 8/01    small hemorrhagic stroke and possible cavernous hemangioma (Dr. Erling Cruz)  . Osteopenia     (DEXA's done by Dr. Truddie Coco); prev took Fosamax.  No change in DEXA after off Fosamax x 2 years  . Postmenopausal bleeding 2000    benign EMB (Dr. Raphael Gibney)  . GERD (gastroesophageal reflux disease)   . Herpes zoster 2003  . Onychomycosis 2003, 2008    treated with Lamisil  . Breast cancer ([place]) 2001    R breast (T3N1, ER/PR+, HER-2 +) s/p  mastectomy, chemo and chest wall irradiation (Dr. Truddie Coco)  . Urge urinary incontinence   . Ovarian cyst 12/2008    right  . Hearing loss 2012    wears hearing aids    Past Surgical History  Procedure Laterality Date  . Tubal ligation    . Tonsillectomy    . Modified mastectomy and tram flap reconstruction  2001    R breast  . Tram flap removal      problems with flap after radiation  . Cholecystectomy, laparoscopic  1/05  . Colonoscopy w/ polypectomy  05/23/01    adenomatous polyp  . Colonoscopy  12/17/08    normal (Dr. Wynetta Emery)  . Cataract extraction  summer 2011    bilateral    Current Medications: Outpatient Prescriptions Prior to Visit  Medication Sig Dispense Refill  . acetaminophen (TYLENOL) 500 MG tablet Take 500 mg by mouth every 6 (six) hours as needed for headache. Reported on 07/10/2015    . allopurinol (ZYLOPRIM) 100 MG tablet TAKE 2 TABLETS (200 MG TOTAL) BY MOUTH DAILY. 180 tablet 3  . calcium carbonate (TUMS - DOSED IN MG ELEMENTAL CALCIUM) 500 MG chewable tablet Chew 1 tablet by mouth daily as needed for indigestion or heartburn. Reported on 07/10/2015    . Calcium Carbonate-Vitamin D (CALTRATE 600+D) 600-400 MG-UNIT per tablet Take 2 tablets by mouth daily. Takes one in the am everyday and Monday,wednesday and Friday take one at night also    .  celecoxib (CELEBREX) 200 MG capsule Take 1 capsule (200 mg total) by mouth every other day. 90 capsule 0  . diltiazem (CARDIZEM CD) 120 MG 24 hr capsule Take 1 capsule (120 mg total) by mouth daily. 90 capsule 3  . diltiazem (CARDIZEM) 30 MG tablet Cardizem 70m -- take 1 tablet by mouth every 4 hours AS NEEDED for heart rate >100 as long as blood pressure >100. 30 tablet 2  . furosemide (LASIX) 20 MG tablet Take 0.5-1 tablets (10-20 mg total) by mouth daily as needed for fluid or edema. 90 tablet 0  . lisinopril (PRINIVIL,ZESTRIL) 40 MG tablet TAKE 1 TABLET BY MOUTH DAILY 90 tablet 0  . Multiple Vitamins-Minerals (CENTRUM SILVER  PO) Take 1 tablet by mouth daily.      .Marland Kitchenomeprazole (PRILOSEC) 20 MG capsule Take 1 capsule (20 mg total) by mouth daily. Reported on 05/23/2015    . pravastatin (PRAVACHOL) 20 MG tablet Take 1 tablet (20 mg total) by mouth daily. 90 tablet 1  . Rivaroxaban (XARELTO) 15 MG TABS tablet Take 1 tablet (15 mg total) by mouth daily with supper. 90 tablet 3  . tolterodine (DETROL LA) 2 MG 24 hr capsule Take 1 capsule (2 mg total) by mouth daily. 90 capsule 1  . atovaquone-proguanil (MALARONE) 250-100 MG TABS tablet Reported on 10/10/2015    . azithromycin (ZITHROMAX) 250 MG tablet Reported on 10/10/2015    . benzonatate (TESSALON) 100 MG capsule Take 100 mg by mouth 3 (three) times daily as needed for cough. Reported on 10/10/2015    . colchicine 0.6 MG tablet Take 2 tablets at onset of pain, then 1 tablet 1 hour later if needed. May then take twice daily if needed (Patient not taking: Reported on 07/10/2015) 60 tablet 0   No facility-administered medications prior to visit.     Allergies:   Cephalexin; Ciprofloxacin; Diclofenac; and Shellfish-derived products   Social History   Social History  . Marital Status: Married    Spouse Name: N/A  . Number of Children: 3  . Years of Education: N/A   Social History Main Topics  . Smoking status: Never Smoker   . Smokeless tobacco: Never Used  . Alcohol Use: No  . Drug Use: No  . Sexual Activity: Not Currently     Comment: due to poor libido   Other Topics Concern  . None   Social History Narrative   Lives with husband, cNeurosurgeon  1 daughter at [employer] daughter in [place](works in [employer], museum/galleryat [employer], son in [place] 8 grandchildren, 1 great-grandchild due 06/2015     Family History:  The patient's family history includes Diabetes in her brother; Heart disease in her brother, father, and mother; Hypertension in her father and mother. There is no history of Drug abuse or Cancer.   ROS:   Please see the history of present illness.    Articular  aches and pains particularly if she does not have Celebrex on board. No bleeding since Xarelto was started.  All other systems reviewed and are negative.   PHYSICAL EXAM:   VS:  BP 126/70 mmHg  Pulse 85  Ht _0  (1.549 m)  Wt 161 lb 12.8 oz (73.392 kg)  BMI 30.59 kg/m2   GEN: Well nourished, well developed, in no acute distress HEENT: normal Neck: no JVD, carotid bruits, or masses Cardiac: RRR; no murmurs, rubs, or gallops,no edema  Respiratory:  clear to auscultation bilaterally, normal work of breathing GI: soft, nontender, nondistended, +  BS MS: no deformity or atrophy Skin: warm and dry, no rash Neuro:  Alert and Oriented x 3, Strength and sensation are intact Psych: euthymic mood, full affect  Wt Readings from Last 3 Encounters:  10/10/15 161 lb 12.8 oz (73.392 kg)  07/10/15 162 lb (73.483 kg)  07/04/15 161 lb 12.8 oz (73.392 kg)      Studies/Labs Reviewed:   EKG:  EKG  Is not repeated.  Recent Labs: 05/14/2015: ALT 17; BUN 29*; Creat 1.09*; Hemoglobin 13.2; Platelets 163; Potassium 4.1; Sodium 139; TSH 3.414   Lipid Panel    Component Value Date/Time   CHOL 162 05/14/2015 0001   TRIG 146 05/14/2015 0001   HDL 44* 05/14/2015 0001   CHOLHDL 3.7 05/14/2015 0001   VLDL 29 05/14/2015 0001   LDLCALC 89 05/14/2015 0001    Additional studies/ records that were reviewed today include:  Reviewed atrial fibrillation office records.  Echocardiogram from 06/26/2015: Study Conclusions  - Left ventricle: The cavity size was normal. Wall thickness was  increased in a pattern of mild LVH. Systolic function was normal.  The estimated ejection fraction was in the range of 50% to 55%.  Wall motion was normal; there were no regional wall motion  abnormalities. - Aortic valve: Trileaflet; mildly thickened, mildly calcified  leaflets. There was mild regurgitation. - Mitral valve: Calcified annulus. There was mild regurgitation. - Pericardium, extracardiac: A trivial  pericardial effusion was  identified posterior to the heart.  ASSESSMENT:    1. Paroxysmal atrial fibrillation (HCC)   2. Chronic anticoagulation   3. Essential hypertension      PLAN:  In order of problems listed above:  1. Paroxysmal atrial fibrillation, asymptomatic when present. We discussed the embolic risk associated with atrial fibrillation in her particular case. This patients CHA2DS2-VASc Score and unadjusted Ischemic Stroke Rate (% per year) is equal to 4.8 % stroke rate/year from a score of 4. The discussed the interaction between anti-inflammatory therapy and chronic anticoagulation resulting in bleeding. I have recommended that she not use Celebrex. If she and Dr. Tomi Bamberger chooses to use Celebrex, dosing should be as infrequent as possible. I will recommend non-anti-inflammatory allergies it therapy for arthritic pain. She needs a follow-up in 6 months to monitor for recurrent atrial fibrillation. We discussed one of the main issues in her longitudinal care will be preventing atrial fibrillation being present with chronic high heart rates second cause tachycardia induced cardiomyopathy.     Medication Adjustments/Labs and Tests Ordered: Current medicines are reviewed at length with the patient today.  Concerns regarding medicines are outlined above.  Medication changes, Labs and Tests ordered today are listed in the Patient Instructions below. Patient Instructions  Medication Instructions:  Your physician recommends that you continue on your current medications as directed. Please refer to the Current Medication list given to you today.   Labwork: None ordered  Testing/Procedures: Non ordered  Follow-Up: Your physician wants you to follow-up in: 6 months with Dr.Ulus Hazen You will receive a reminder letter in the mail two months in advance. If you don't receive a letter, please call our office to schedule the follow-up appointment.   Any Other Special Instructions Will Be  Listed Below (If Applicable).     If you need a refill on your cardiac medications before your next appointment, please call your pharmacy.       Signed, Sinclair Grooms, MD  10/10/2015 12:12 PM    [place],  [place]  [address] Phone: [phone]; Fax: [phone]

## 2015-10-09 NOTE — Telephone Encounter (Signed)
Is this okay to refill? 

## 2015-10-10 ENCOUNTER — Encounter: Payer: Self-pay | Admitting: Interventional Cardiology

## 2015-10-10 ENCOUNTER — Ambulatory Visit (INDEPENDENT_AMBULATORY_CARE_PROVIDER_SITE_OTHER): Payer: Medicare HMO | Admitting: Interventional Cardiology

## 2015-10-10 VITALS — BP 126/70 | HR 85 | Ht 61.0 in | Wt 161.8 lb

## 2015-10-10 DIAGNOSIS — I480 Paroxysmal atrial fibrillation: Secondary | ICD-10-CM

## 2015-10-10 DIAGNOSIS — Z7901 Long term (current) use of anticoagulants: Secondary | ICD-10-CM

## 2015-10-10 DIAGNOSIS — I10 Essential (primary) hypertension: Secondary | ICD-10-CM

## 2015-10-10 NOTE — Patient Instructions (Signed)
Medication Instructions:  Your physician recommends that you continue on your current medications as directed. Please refer to the Current Medication list given to you today.   Labwork: None ordered  Testing/Procedures: Non ordered  Follow-Up: Your physician wants you to follow-up in: 6 months with Dr.Smith You will receive a reminder letter in the mail two months in advance. If you don't receive a letter, please call our office to schedule the follow-up appointment.   Any Other Special Instructions Will Be Listed Below (If Applicable).     If you need a refill on your cardiac medications before your next appointment, please call your pharmacy.

## 2015-11-05 ENCOUNTER — Other Ambulatory Visit: Payer: Self-pay | Admitting: Family Medicine

## 2015-11-21 ENCOUNTER — Other Ambulatory Visit: Payer: Medicare HMO

## 2015-11-21 DIAGNOSIS — I10 Essential (primary) hypertension: Secondary | ICD-10-CM

## 2015-11-21 DIAGNOSIS — E7800 Pure hypercholesterolemia, unspecified: Secondary | ICD-10-CM

## 2015-11-21 DIAGNOSIS — M1000 Idiopathic gout, unspecified site: Secondary | ICD-10-CM | POA: Diagnosis not present

## 2015-11-21 DIAGNOSIS — Z5181 Encounter for therapeutic drug level monitoring: Secondary | ICD-10-CM | POA: Diagnosis not present

## 2015-11-21 DIAGNOSIS — M109 Gout, unspecified: Secondary | ICD-10-CM

## 2015-11-21 LAB — COMPREHENSIVE METABOLIC PANEL
ALT: 35 U/L — ABNORMAL HIGH (ref 6–29)
AST: 31 U/L (ref 10–35)
Albumin: 3.7 g/dL (ref 3.6–5.1)
Alkaline Phosphatase: 72 U/L (ref 33–130)
BUN: 23 mg/dL (ref 7–25)
CO2: 24 mmol/L (ref 20–31)
Calcium: 9.4 mg/dL (ref 8.6–10.4)
Chloride: 109 mmol/L (ref 98–110)
Creat: 1.16 mg/dL — ABNORMAL HIGH (ref 0.60–0.88)
Glucose, Bld: 99 mg/dL (ref 65–99)
Potassium: 3.9 mmol/L (ref 3.5–5.3)
Sodium: 142 mmol/L (ref 135–146)
Total Bilirubin: 0.5 mg/dL (ref 0.2–1.2)
Total Protein: 5.8 g/dL — ABNORMAL LOW (ref 6.1–8.1)

## 2015-11-21 LAB — CBC WITH DIFFERENTIAL/PLATELET
Basophils Absolute: 0 cells/uL (ref 0–200)
Basophils Relative: 0 %
Eosinophils Absolute: 216 cells/uL (ref 15–500)
Eosinophils Relative: 4 %
HCT: 38.9 % (ref 35.0–45.0)
Hemoglobin: 13 g/dL (ref 11.7–15.5)
Lymphocytes Relative: 30 %
Lymphs Abs: 1620 cells/uL (ref 850–3900)
MCH: 29.9 pg (ref 27.0–33.0)
MCHC: 33.4 g/dL (ref 32.0–36.0)
MCV: 89.4 fL (ref 80.0–100.0)
MPV: 10.9 fL (ref 7.5–12.5)
Monocytes Absolute: 432 cells/uL (ref 200–950)
Monocytes Relative: 8 %
Neutro Abs: 3132 cells/uL (ref 1500–7800)
Neutrophils Relative %: 58 %
Platelets: 170 10*3/uL (ref 140–400)
RBC: 4.35 MIL/uL (ref 3.80–5.10)
RDW: 15.3 % — ABNORMAL HIGH (ref 11.0–15.0)
WBC: 5.4 10*3/uL (ref 4.0–10.5)

## 2015-11-21 LAB — LIPID PANEL
Cholesterol: 142 mg/dL (ref 125–200)
HDL: 38 mg/dL — ABNORMAL LOW (ref >=46)
LDL Cholesterol: 58 mg/dL (ref <130)
Total CHOL/HDL Ratio: 3.7 Ratio (ref <=5.0)
Triglycerides: 229 mg/dL — ABNORMAL HIGH (ref <150)
VLDL: 46 mg/dL — ABNORMAL HIGH (ref <30)

## 2015-11-21 LAB — URIC ACID: Uric Acid, Serum: 4.9 mg/dL (ref 2.5–7.0)

## 2015-11-27 NOTE — Progress Notes (Signed)
Chief Complaint  Patient presents with  . Hypertension    nonfasting med check, labs already done. Would like to talk about tylenol vs celebrex with the xarelto. Wants to discuss myrbetriq for urge incontinence. Also brought in list of bp's and wonders if she may need a change on meds.     Atrial Fibrillation:  She last saw Dr. Tamala Julian in June. No med changes were made.  He agreed with my recommendation to try and avoid use of Celebrex (using it as sparingly as possible).  She denies bleeding, bruising, side effects of medications. She continues to take Xarelto.  She doesn't feel when she is in atrial fibrillation, but her blood pressure monitor can tell.  She hasn't had any since April that she is aware of (not noted on her BP checks). She has been taking Tylenol Arthritis 2 twice daily but she still has some discomfort in her feet, R>L.  Hands and back seem to be okay overall, but has a little more discomfort than when she was taking Celebrex daily.  She still feels "low on energy" since her pneumonia. She is going to need refills on cardizem and xarelto and is asking if I will refill them for her. She is due to see Dr. Tamala Julian again in December (6 months from last visit).  Hypertension follow-up: Blood pressures elsewhere are 115-152/65-75, pulse 74-88.Denies dizziness, headaches, chest pain. Denies side effects of medications.   Her lisinopril dose was cut in half when the cardizem was started for her atrial fibrillation.  Gout: She hasn't had any recurrent gout of her great toe, on her current dose of allopurinol (200mg ).   Hyperlipidemia: She had fasting labs prior to her visit. She hasn't tolerated fish oil in the past (due to belching). She doesn't fry at home, but does sometimes order fried food when out, about once a week. She tries to follow a lowfat, low cholesterol diet. She is compliant with her medications and denies any side effects. Admits to eating more candy since her last  visit.  Urge urinary incontinence--She is curently taking Detrol LA 2mg . Denies any significant side effects, but feels like it isn't working well. Denies any dysuria, odor or hematuria. She still gets up 2-3 times/night; and has some daytime urgency/frequency. She is having more accidents/incontinence when she leaves the house, wearing Poise pads daily.  She would like to try Myrbetriq after her husband saw a commercial, due to the worsening of urge incontinence.  She has never tried the higher dose of Detrol LA.  GERD--She is back to taking the prilosec every night (started in afib clinic, when she was still on the celebrex).  She used to take it just prn, a few times/week. She thinks the tylenol was causing some stomach upset.  Edema--usually more frequent with warmer temps, but reports doing well so far this summer. Not needing to use the lasix.    Osteopenia/low bone mass--She previously took Fosamax (for 10 years, per pt). She stopped it prior to dental work, about 5 years ago. She doesn't currently get any weight-bearing exercise other than walking. She takes calcium +D--see med list. She had repeat DEXA in 09/2015 which showed mild thinning of bones--no significant change from last check in 2013. She was told to continue calcium, Vitamin D and weight-bearing exercise  She hasn't been able to walk due to the heat.   PMH, PSH, SH reviewed.  Outpatient Encounter Prescriptions as of 11/28/2015  Medication Sig Note  . acetaminophen (TYLENOL) 650  MG CR tablet Take 1,300 mg by mouth 2 (two) times daily.   Marland Kitchen allopurinol (ZYLOPRIM) 100 MG tablet TAKE 2 TABLETS (200 MG TOTAL) BY MOUTH DAILY.   . calcium carbonate (TUMS - DOSED IN MG ELEMENTAL CALCIUM) 500 MG chewable tablet Chew 1 tablet by mouth daily as needed for indigestion or heartburn. Reported on 07/10/2015 11/28/2015: Uses prn heartburn, every few months  . Calcium Carbonate-Vitamin D (CALTRATE 600+D) 600-400 MG-UNIT per tablet Take 1  tablet by mouth daily. Takes one in the am everyday and Monday,wednesday and Friday take one at night also   . diltiazem (CARDIZEM CD) 120 MG 24 hr capsule Take 1 capsule (120 mg total) by mouth daily.   Marland Kitchen lisinopril (PRINIVIL,ZESTRIL) 40 MG tablet TAKE 1 TABLET BY MOUTH DAILY (Patient taking differently: 20mg  daily) 11/28/2015: Taking 1/2 tablet daily (dose lowered by cardiologist)  . Multiple Vitamins-Minerals (CENTRUM SILVER PO) Take 1 tablet by mouth daily.     Marland Kitchen omeprazole (PRILOSEC) 20 MG capsule Take 1 capsule (20 mg total) by mouth daily. Reported on 05/23/2015 11/28/2015: Taking nightly since diagnosed with atrial fibrillation  . pravastatin (PRAVACHOL) 20 MG tablet Take 1 tablet (20 mg total) by mouth daily.   . Rivaroxaban (XARELTO) 15 MG TABS tablet Take 1 tablet (15 mg total) by mouth daily with supper.   . tolterodine (DETROL LA) 4 MG 24 hr capsule Take 1 capsule (4 mg total) by mouth daily.   . [DISCONTINUED] pravastatin (PRAVACHOL) 20 MG tablet Take 1 tablet (20 mg total) by mouth daily.   . [DISCONTINUED] tolterodine (DETROL LA) 2 MG 24 hr capsule Take 1 capsule (2 mg total) by mouth daily.   . colchicine 0.6 MG tablet Use as directed. Take two tablets by mouth at start of gout flare up. Then one hour later take one tablet by mouth.   . diltiazem (CARDIZEM) 30 MG tablet Cardizem 30mg  -- take 1 tablet by mouth every 4 hours AS NEEDED for heart rate >100 as long as blood pressure >100. (Patient not taking: Reported on 11/28/2015)   . furosemide (LASIX) 20 MG tablet Take 0.5-1 tablets (10-20 mg total) by mouth daily as needed for fluid or edema. (Patient not taking: Reported on 11/28/2015) 05/23/2015: Rarely uses; swelling was worse in the heat  . [DISCONTINUED] acetaminophen (TYLENOL) 500 MG tablet Take 500 mg by mouth every 6 (six) hours as needed for headache. Reported on 07/10/2015 10/30/2013: Uses only prn headache  . [DISCONTINUED] celecoxib (CELEBREX) 200 MG capsule Take 1 capsule (200 mg  total) by mouth every other day.    No facility-administered encounter medications on file as of 11/28/2015.    Allergies  Allergen Reactions  . Cephalexin Itching  . Ciprofloxacin Itching  . Diclofenac Nausea Only    Upset stomach  . Shellfish-Derived Products Nausea And Vomiting    ROS: no fever, chills, URI symptoms, headaches, dizziness, chest pain, shortness of breath, edema, nausea, vomiting, heartburn, bowel changes, bleeding, bruising, rash.  +arthritis, mainly now in her feet. +urinary incontinence as per HPI.  Denies vaginal discharge, bleeding. Denies depression or other complaints. See HPI.  PHYSICAL EXAM:  BP (!) 148/70 (BP Location: Left Arm, Patient Position: Sitting, Cuff Size: Normal)   Pulse 88   Ht 5' 0.5" (1.537 m)   Wt 163 lb (73.9 kg)   BMI 31.31 kg/m   142/60 on repeat by MD  Well developed, pleasant female, in good spirits HEENT: PERRL, EOMI, conjunctiva clear. OP is clear Neck: no lymphadenopathy, thyromegaly  or carotid bruit Heart: regular rate and rhythm. No heart murmur Lungs: clear bilaterally Back: no CVA or spinal tenderness Abdomen: soft, nontender, no organomegaly or mass Extremities: No edema. 2+ pulses. Bunion deformity. Area of discomfort is across the MTP's-- no redness or swelling. Skin: no bruising, rashes.   Neuro: alert and oriented. Cranial nerves, strength, sensation and gait all normal Psych: normal mood, affect, hygiene and grooming   Lab Results  Component Value Date   CHOL 142 11/21/2015   HDL 38 (L) 11/21/2015   LDLCALC 58 11/21/2015   TRIG 229 (H) 11/21/2015   CHOLHDL 3.7 11/21/2015     Chemistry      Component Value Date/Time   NA 142 11/21/2015 0840   NA 141 11/11/2012 1020   K 3.9 11/21/2015 0840   K 4.1 11/11/2012 1020   CL 109 11/21/2015 0840   CO2 24 11/21/2015 0840   CO2 26 11/11/2012 1020   BUN 23 11/21/2015 0840   BUN 31.6 (H) 11/11/2012 1020   CREATININE 1.16 (H) 11/21/2015 0840   CREATININE 1.5 (H)  11/11/2012 1020      Component Value Date/Time   CALCIUM 9.4 11/21/2015 0840   CALCIUM 10.1 11/11/2012 1020   ALKPHOS 72 11/21/2015 0840   ALKPHOS 78 11/11/2012 1020   AST 31 11/21/2015 0840   AST 19 11/11/2012 1020   ALT 35 (H) 11/21/2015 0840   ALT 17 11/11/2012 1020   BILITOT 0.5 11/21/2015 0840   BILITOT 0.43 11/11/2012 1020     Fasting glucose 99  Lab Results  Component Value Date   WBC 5.4 11/21/2015   HGB 13.0 11/21/2015   HCT 38.9 11/21/2015   MCV 89.4 11/21/2015   PLT 170 11/21/2015   Lab Results  Component Value Date   LABURIC 4.9 11/21/2015    ASSESSMENT/PLAN:  Pure hypercholesterolemia - LDL at goal, elevated TG. Diet reviewed; consider trying different brand of fish oil - Plan: pravastatin (PRAVACHOL) 20 MG tablet  Essential hypertension, benign - overall controlled, some higher values.  Restart daily exercise, low sodium diet; adjust doses if SBP consistently >140  Urge incontinence of urine - inadequately controlled on 2mg  Detrol LA; increase to 4mg . consider myrbetriq trial if not controlled - Plan: tolterodine (DETROL LA) 4 MG 24 hr capsule  Primary osteoarthritis involving multiple joints - may take Tylenol Arthritis up to three times daily; refer to podiatry for foot pain. Avoid Celebrex due to anticoagulation  Atrial fibrillation with rapid ventricular response (HCC)  Pure hypercholesterolemia - controlled - Plan: pravastatin (PRAVACHOL) 20 MG tablet  Bilateral foot pain - Plan: Ambulatory referral to Podiatry   Trial of increased dose of Detrol LA to 4mg .  If this is ineffective or if side effects aren't tolerable, we can try the Myrbetriq.  She will let us know.  Discussed recent studies with PPI's used preventatively in afib (as causing more CHF).  Since she no longer takes Celebrex, I'd prefer her to go back to taking prilosec prn (prior to large meals, eating late, etc), not daily preventatively.  Refer to [place] for bilateral  foot pain, R>L.  Pt to contact us if her BP's are consistently over 140 at home.  F/u 6 months for AWV/CPE/med check, with labs prior

## 2015-11-28 ENCOUNTER — Ambulatory Visit (INDEPENDENT_AMBULATORY_CARE_PROVIDER_SITE_OTHER): Payer: Medicare HMO | Admitting: Family Medicine

## 2015-11-28 VITALS — BP 148/70 | HR 88 | Ht 60.5 in | Wt 163.0 lb

## 2015-11-28 DIAGNOSIS — M79672 Pain in left foot: Secondary | ICD-10-CM | POA: Diagnosis not present

## 2015-11-28 DIAGNOSIS — M79671 Pain in right foot: Secondary | ICD-10-CM

## 2015-11-28 DIAGNOSIS — I10 Essential (primary) hypertension: Secondary | ICD-10-CM

## 2015-11-28 DIAGNOSIS — N3941 Urge incontinence: Secondary | ICD-10-CM | POA: Diagnosis not present

## 2015-11-28 DIAGNOSIS — M8949 Other hypertrophic osteoarthropathy, multiple sites: Secondary | ICD-10-CM

## 2015-11-28 DIAGNOSIS — M150 Primary generalized (osteo)arthritis: Secondary | ICD-10-CM

## 2015-11-28 DIAGNOSIS — I4891 Unspecified atrial fibrillation: Secondary | ICD-10-CM

## 2015-11-28 DIAGNOSIS — E7800 Pure hypercholesterolemia, unspecified: Secondary | ICD-10-CM | POA: Diagnosis not present

## 2015-11-28 DIAGNOSIS — Z5181 Encounter for therapeutic drug level monitoring: Secondary | ICD-10-CM | POA: Diagnosis not present

## 2015-11-28 DIAGNOSIS — M159 Polyosteoarthritis, unspecified: Secondary | ICD-10-CM

## 2015-11-28 DIAGNOSIS — Z7901 Long term (current) use of anticoagulants: Secondary | ICD-10-CM | POA: Diagnosis not present

## 2015-11-28 MED ORDER — TOLTERODINE TARTRATE ER 4 MG PO CP24: 4.0000 mg | ORAL_CAPSULE | Freq: Every day | ORAL | 5 refills | Status: DC

## 2015-11-28 MED ORDER — PRAVASTATIN SODIUM 20 MG PO TABS: 20.0000 mg | ORAL_TABLET | Freq: Every day | ORAL | 1 refills | Status: DC

## 2015-11-28 NOTE — Patient Instructions (Addendum)
Increase the Detrol LA to 4mg .  I sent a new prescription, but you can double up on the few that you have left.  If it causes too many side effects, or if it isn't effective, let us know and we can try the Myrbetriq instead.  We are going to refer you to [facility] for your foot pain.  Let us know if your blood pressures are consistently running 0000000 systolic at home.  Medications should then be adjusted. Continue the low sodium diet.  Once you resume a daily walking/exercise program, you should see an improvement in the blood pressure.    I'd like for you to try cutting back on the prilosec--try it every other day for a week and if no recurrent heartburn/reflux symptoms, then go back to using it just as needed, like you used to. If you need it daily, it is okay to continue daily use.  You have plenty of refills (to last through 07/2016) through the atrial fib clinic.

## 2015-12-31 ENCOUNTER — Ambulatory Visit (INDEPENDENT_AMBULATORY_CARE_PROVIDER_SITE_OTHER): Payer: Medicare HMO | Admitting: Sports Medicine

## 2015-12-31 ENCOUNTER — Encounter: Payer: Self-pay | Admitting: Sports Medicine

## 2015-12-31 DIAGNOSIS — M79672 Pain in left foot: Secondary | ICD-10-CM

## 2015-12-31 DIAGNOSIS — M21619 Bunion of unspecified foot: Secondary | ICD-10-CM

## 2015-12-31 DIAGNOSIS — M79671 Pain in right foot: Secondary | ICD-10-CM | POA: Diagnosis not present

## 2015-12-31 DIAGNOSIS — B351 Tinea unguium: Secondary | ICD-10-CM | POA: Diagnosis not present

## 2015-12-31 NOTE — Progress Notes (Signed)
Subjective: Connie [name] is a 82 y.o. female patient seen today in office with complaint of painful thickened and elongated toenails; unable to trim. Patient denies history of Diabetes, Neuropathy, or Vascular disease. Patient has no other pedal complaints at this time.   Patient Active Problem List   Diagnosis Date Noted  . Atrial fibrillation with rapid ventricular response ([place]) 06/24/2015  . Gout 10/30/2013  . Gout of big toe 10/30/2013  . Osteoarthritis of multiple joints 10/30/2013  . Breast cancer, right breast ([place]) 11/25/2012  . Urge incontinence of urine 04/24/2011  . OA (osteoarthritis) 04/24/2011  . Essential hypertension, benign 10/23/2010  . Pure hypercholesterolemia 10/23/2010    Current Outpatient Prescriptions on File Prior to Visit  Medication Sig Dispense Refill  . acetaminophen (TYLENOL) 650 MG CR tablet Take 1,300 mg by mouth 2 (two) times daily.    Marland Kitchen allopurinol (ZYLOPRIM) 100 MG tablet TAKE 2 TABLETS (200 MG TOTAL) BY MOUTH DAILY. 180 tablet 0  . calcium carbonate (TUMS - DOSED IN MG ELEMENTAL CALCIUM) 500 MG chewable tablet Chew 1 tablet by mouth daily as needed for indigestion or heartburn. Reported on 07/10/2015    . Calcium Carbonate-Vitamin D (CALTRATE 600+D) 600-400 MG-UNIT per tablet Take 1 tablet by mouth daily. Takes one in the am everyday and Monday,wednesday and Friday take one at night also    . colchicine 0.6 MG tablet Use as directed. Take two tablets by mouth at start of gout flare up. Then one hour later take one tablet by mouth.    . diltiazem (CARDIZEM CD) 120 MG 24 hr capsule Take 1 capsule (120 mg total) by mouth daily. 90 capsule 3  . diltiazem (CARDIZEM) 30 MG tablet Cardizem 30mg  -- take 1 tablet by mouth every 4 hours AS NEEDED for heart rate >100 as long as blood pressure >100. (Patient not taking: Reported on 11/28/2015) 30 tablet 2  . furosemide (LASIX) 20 MG tablet Take 0.5-1 tablets (10-20 mg total) by mouth daily as needed for fluid or  edema. (Patient not taking: Reported on 11/28/2015) 90 tablet 0  . lisinopril (PRINIVIL,ZESTRIL) 40 MG tablet TAKE 1 TABLET BY MOUTH DAILY (Patient taking differently: 20mg  daily) 90 tablet 0  . Multiple Vitamins-Minerals (CENTRUM SILVER PO) Take 1 tablet by mouth daily.      Marland Kitchen omeprazole (PRILOSEC) 20 MG capsule Take 1 capsule (20 mg total) by mouth daily. Reported on 05/23/2015    . pravastatin (PRAVACHOL) 20 MG tablet Take 1 tablet (20 mg total) by mouth daily. 90 tablet 1  . Rivaroxaban (XARELTO) 15 MG TABS tablet Take 1 tablet (15 mg total) by mouth daily with supper. 90 tablet 3  . tolterodine (DETROL LA) 4 MG 24 hr capsule Take 1 capsule (4 mg total) by mouth daily. 30 capsule 5  . [DISCONTINUED] solifenacin (VESICARE) 5 MG tablet Take 1 tablet (5 mg total) by mouth daily. 90 tablet 0   No current facility-administered medications on file prior to visit.     Allergies  Allergen Reactions  . Cephalexin Itching  . Ciprofloxacin Itching  . Diclofenac Nausea Only    Upset stomach  . Shellfish-Derived Products Nausea And Vomiting    Objective: Physical Exam  General: Well developed, nourished, no acute distress, awake, alert and oriented x 3  Vascular: Dorsalis pedis artery 1/4 bilateral, Posterior tibial artery 1/4 bilateral, skin temperature warm to warm proximal to distal bilateral lower extremities, no varicosities, pedal hair present bilateral.  Neurological: Gross sensation present via light touch  bilateral.   Dermatological: Skin is warm, dry, and supple bilateral, Nails 1-10 are tender, long, thick, and discolored with mild subungal debris, no webspace macerations present bilateral, no open lesions present bilateral, no callus/corns/hyperkeratotic tissue present bilateral. No signs of infection bilateral.  Musculoskeletal: Asymptomatic bunion boney deformities noted bilateral. Muscular strength within normal limits without painon range of motion. No pain with calf compression  bilateral.  Assessment and Plan:  Problem List Items Addressed This Visit    None    Visit Diagnoses    Dermatophytosis of nail    -  Primary   Bunion       Foot pain, bilateral          -Examined patient.  -Discussed treatment options for painful mycotic nails. -Mechanically debrided and reduced mycotic nails with sterile nail nipper and dremel nail file without incident. -Recommend good supportive shoes daily for foot type -Patient to return in 3 months for follow up evaluation or sooner if symptoms worsen.  Landis Martins, DPM

## 2016-02-16 ENCOUNTER — Other Ambulatory Visit: Payer: Self-pay | Admitting: Family Medicine

## 2016-02-24 DIAGNOSIS — R69 Illness, unspecified: Secondary | ICD-10-CM | POA: Diagnosis not present

## 2016-02-26 ENCOUNTER — Encounter: Payer: Self-pay | Admitting: *Deleted

## 2016-03-16 DIAGNOSIS — H43813 Vitreous degeneration, bilateral: Secondary | ICD-10-CM | POA: Diagnosis not present

## 2016-03-16 DIAGNOSIS — H5211 Myopia, right eye: Secondary | ICD-10-CM | POA: Diagnosis not present

## 2016-03-16 DIAGNOSIS — H52222 Regular astigmatism, left eye: Secondary | ICD-10-CM | POA: Diagnosis not present

## 2016-03-16 DIAGNOSIS — H524 Presbyopia: Secondary | ICD-10-CM | POA: Diagnosis not present

## 2016-03-16 DIAGNOSIS — Z961 Presence of intraocular lens: Secondary | ICD-10-CM | POA: Diagnosis not present

## 2016-04-01 ENCOUNTER — Other Ambulatory Visit: Payer: Self-pay | Admitting: Family Medicine

## 2016-04-07 ENCOUNTER — Ambulatory Visit (INDEPENDENT_AMBULATORY_CARE_PROVIDER_SITE_OTHER): Payer: Medicare HMO | Admitting: Sports Medicine

## 2016-04-07 ENCOUNTER — Encounter: Payer: Self-pay | Admitting: Sports Medicine

## 2016-04-07 DIAGNOSIS — B351 Tinea unguium: Secondary | ICD-10-CM

## 2016-04-07 DIAGNOSIS — M79672 Pain in left foot: Secondary | ICD-10-CM

## 2016-04-07 DIAGNOSIS — M79671 Pain in right foot: Secondary | ICD-10-CM | POA: Diagnosis not present

## 2016-04-07 DIAGNOSIS — M21619 Bunion of unspecified foot: Secondary | ICD-10-CM

## 2016-04-07 NOTE — Progress Notes (Signed)
Subjective: Connie [name] is a 82 y.o. female patient seen today in office with complaint of painful thickened and elongated toenails; unable to trim. Patient denies changes with medical history states that now she has been started on Xalerto for Afib. Patient has no other pedal complaints at this time.   Admits for next visit wants to be seen before goes on cruise in March 2018.  Patient Active Problem List   Diagnosis Date Noted  . Atrial fibrillation with rapid ventricular response ([place]) 06/24/2015  . Gout 10/30/2013  . Gout of big toe 10/30/2013  . Osteoarthritis of multiple joints 10/30/2013  . Breast cancer, right breast ([place]) 11/25/2012  . Urge incontinence of urine 04/24/2011  . OA (osteoarthritis) 04/24/2011  . Essential hypertension, benign 10/23/2010  . Pure hypercholesterolemia 10/23/2010    Current Outpatient Prescriptions on File Prior to Visit  Medication Sig Dispense Refill  . acetaminophen (TYLENOL) 650 MG CR tablet Take 1,300 mg by mouth 2 (two) times daily.    Marland Kitchen allopurinol (ZYLOPRIM) 100 MG tablet TAKE TWO TABLETS BY MOUTH DAILY 180 tablet 0  . calcium carbonate (TUMS - DOSED IN MG ELEMENTAL CALCIUM) 500 MG chewable tablet Chew 1 tablet by mouth daily as needed for indigestion or heartburn. Reported on 07/10/2015    . Calcium Carbonate-Vitamin D (CALTRATE 600+D) 600-400 MG-UNIT per tablet Take 1 tablet by mouth daily. Takes one in the am everyday and Monday,wednesday and Friday take one at night also    . colchicine 0.6 MG tablet Use as directed. Take two tablets by mouth at start of gout flare up. Then one hour later take one tablet by mouth.    . diltiazem (CARDIZEM CD) 120 MG 24 hr capsule Take 1 capsule (120 mg total) by mouth daily. 90 capsule 3  . diltiazem (CARDIZEM) 30 MG tablet Cardizem 30mg  -- take 1 tablet by mouth every 4 hours AS NEEDED for heart rate >100 as long as blood pressure >100. (Patient not taking: Reported on 11/28/2015) 30 tablet 2  .  furosemide (LASIX) 20 MG tablet Take 0.5-1 tablets (10-20 mg total) by mouth daily as needed for fluid or edema. (Patient not taking: Reported on 11/28/2015) 90 tablet 0  . lisinopril (PRINIVIL,ZESTRIL) 40 MG tablet TAKE ONE TABLET BY MOUTH DAILY 90 tablet 0  . Multiple Vitamins-Minerals (CENTRUM SILVER PO) Take 1 tablet by mouth daily.      Marland Kitchen omeprazole (PRILOSEC) 20 MG capsule Take 1 capsule (20 mg total) by mouth daily. Reported on 05/23/2015    . pravastatin (PRAVACHOL) 20 MG tablet Take 1 tablet (20 mg total) by mouth daily. 90 tablet 1  . Rivaroxaban (XARELTO) 15 MG TABS tablet Take 1 tablet (15 mg total) by mouth daily with supper. 90 tablet 3  . tolterodine (DETROL LA) 4 MG 24 hr capsule Take 1 capsule (4 mg total) by mouth daily. 30 capsule 5  . [DISCONTINUED] solifenacin (VESICARE) 5 MG tablet Take 1 tablet (5 mg total) by mouth daily. 90 tablet 0   No current facility-administered medications on file prior to visit.     Allergies  Allergen Reactions  . Cephalexin Itching  . Ciprofloxacin Itching  . Diclofenac Nausea Only    Upset stomach  . Shellfish-Derived Products Nausea And Vomiting    Objective: Physical Exam  General: Well developed, nourished, no acute distress, awake, alert and oriented x 3  Vascular: Dorsalis pedis artery 1/4 bilateral, Posterior tibial artery 1/4 bilateral, skin temperature warm to warm proximal to distal bilateral  lower extremities, no varicosities, pedal hair present bilateral.  Neurological: Gross sensation present via light touch bilateral.   Dermatological: Skin is warm, dry, and supple bilateral, Nails 1-10 are tender, long, thick, and discolored with mild subungal debris, no webspace macerations present bilateral, no open lesions present bilateral, minimal reactive keratosis medial aspect of right bunion. No signs of infection bilateral.  Musculoskeletal: Asymptomatic bunion boney deformities noted bilateral. Muscular strength within normal  limits without painon range of motion. No pain with calf compression bilateral.  Assessment and Plan:  Problem List Items Addressed This Visit    None    Visit Diagnoses    Dermatophytosis of nail    -  Primary   Bunion       Foot pain, bilateral         -Examined patient.  -Discussed treatment options for painful mycotic nails. -Mechanically debrided and reduced mycotic nails with sterile nail nipper and dremel nail file without incident. -Recommend good supportive shoes daily for foot type -Patient to return in 2.5 months for follow up evaluation or sooner if symptoms worsen.  Landis Martins, DPM

## 2016-05-11 ENCOUNTER — Encounter: Payer: Self-pay | Admitting: Interventional Cardiology

## 2016-05-11 ENCOUNTER — Ambulatory Visit (INDEPENDENT_AMBULATORY_CARE_PROVIDER_SITE_OTHER): Payer: Medicare HMO | Admitting: Interventional Cardiology

## 2016-05-11 VITALS — BP 170/70 | HR 77 | Ht 60.0 in | Wt 163.8 lb

## 2016-05-11 DIAGNOSIS — I10 Essential (primary) hypertension: Secondary | ICD-10-CM

## 2016-05-11 DIAGNOSIS — I4891 Unspecified atrial fibrillation: Secondary | ICD-10-CM

## 2016-05-11 DIAGNOSIS — Z7901 Long term (current) use of anticoagulants: Secondary | ICD-10-CM

## 2016-05-11 MED ORDER — APIXABAN 2.5 MG PO TABS: 2.5000 mg | ORAL_TABLET | Freq: Two times a day (BID) | ORAL | 11 refills | Status: DC

## 2016-05-11 NOTE — Progress Notes (Signed)
Cardiology Office Note    Date:  05/11/2016   ID:  Connie [name], DOB [birth year], MRN [id number]  PCP:  Vikki Ports, MD  Cardiologist: Sinclair Grooms, MD   Chief Complaint  Patient presents with  . Atrial Fibrillation    History of Present Illness:  Connie [name] is a 83 y.o. female with coincidental diagnosis of atrial fibrillation in early 2017 associated with pneumonia/upper respiratory illness, chronic anticoagulation therapy, hypertension, history of breast cancer, and gastroesophageal reflux.  She has been asymptomatic since last seen. No bleeding complications on Xarelto 15 mg daily. She was placed on lower dose therapy because of GFR less than 50. She has had no bleeding or significant problems since the diagnosis was made.  Past Medical History:  Diagnosis Date  . Abnormal brain MRI 8/01   small hemorrhagic stroke and possible cavernous hemangioma (Dr. Erling Cruz)  . Adenomatous colon polyp 1/03  . Arthritis    OA spine, hands  . Breast cancer ([place]) 2001   R breast (T3N1, ER/PR+, HER-2 +) s/p mastectomy, chemo and chest wall irradiation (Dr. Truddie Coco)  . DDD (degenerative disc disease), lumbar   . GERD (gastroesophageal reflux disease)   . Hearing loss 2012   wears hearing aids  . Herpes zoster 2003  . Hyperlipidemia   . Hypertension   . Onychomycosis 2003, 2008   treated with Lamisil  . Osteopenia    (DEXA's done by Dr. Truddie Coco); prev took Fosamax.  No change in DEXA after off Fosamax x 2 years  . Ovarian cyst 12/2008   right  . Postmenopausal bleeding 2000   benign EMB (Dr. Raphael Gibney)  . Urge urinary incontinence     Past Surgical History:  Procedure Laterality Date  . CATARACT EXTRACTION  summer 2011   bilateral  . CHOLECYSTECTOMY, LAPAROSCOPIC  1/05  . COLONOSCOPY  12/17/08   normal (Dr. Wynetta Emery)  . COLONOSCOPY W/ POLYPECTOMY  05/23/01   adenomatous polyp  . modified mastectomy and tram flap reconstruction  2001   R breast  . TONSILLECTOMY    .  tram flap removal     problems with flap after radiation  . TUBAL LIGATION      Current Medications: Outpatient Medications Prior to Visit  Medication Sig Dispense Refill  . acetaminophen (TYLENOL) 650 MG CR tablet Take 1,300 mg by mouth 2 (two) times daily.    Marland Kitchen allopurinol (ZYLOPRIM) 100 MG tablet TAKE TWO TABLETS BY MOUTH DAILY 180 tablet 0  . calcium carbonate (TUMS - DOSED IN MG ELEMENTAL CALCIUM) 500 MG chewable tablet Chew 1 tablet by mouth daily as needed for indigestion or heartburn. Reported on 07/10/2015    . Calcium Carbonate-Vitamin D (CALTRATE 600+D) 600-400 MG-UNIT per tablet Take 1 tablet by mouth daily. Takes one in the am everyday and Monday,wednesday and Friday take one at night also    . colchicine 0.6 MG tablet Use as directed. Take two tablets by mouth at start of gout flare up. Then one hour later take one tablet by mouth.    . diltiazem (CARDIZEM CD) 120 MG 24 hr capsule Take 1 capsule (120 mg total) by mouth daily. 90 capsule 3  . diltiazem (CARDIZEM) 30 MG tablet Cardizem 36m -- take 1 tablet by mouth every 4 hours AS NEEDED for heart rate >100 as long as blood pressure >100. 30 tablet 2  . furosemide (LASIX) 20 MG tablet Take 0.5-1 tablets (10-20 mg total) by mouth daily as needed for fluid or edema.  90 tablet 0  . Multiple Vitamins-Minerals (CENTRUM SILVER PO) Take 1 tablet by mouth daily.      Marland Kitchen omeprazole (PRILOSEC) 20 MG capsule Take 1 capsule (20 mg total) by mouth daily. Reported on 05/23/2015    . pravastatin (PRAVACHOL) 20 MG tablet Take 1 tablet (20 mg total) by mouth daily. 90 tablet 1  . Rivaroxaban (XARELTO) 15 MG TABS tablet Take 1 tablet (15 mg total) by mouth daily with supper. 90 tablet 3  . lisinopril (PRINIVIL,ZESTRIL) 40 MG tablet TAKE ONE TABLET BY MOUTH DAILY (Patient not taking: Reported on 05/11/2016) 90 tablet 0  . tolterodine (DETROL LA) 4 MG 24 hr capsule Take 1 capsule (4 mg total) by mouth daily. (Patient not taking: Reported on 05/11/2016) 30  capsule 5   No facility-administered medications prior to visit.      Allergies:   Cephalexin; Ciprofloxacin; Diclofenac; and Shellfish-derived products   Social History   Social History  . Marital status: Married    Spouse name: N/A  . Number of children: 3  . Years of education: N/A   Social History Main Topics  . Smoking status: Never Smoker  . Smokeless tobacco: Never Used  . Alcohol use No  . Drug use: No  . Sexual activity: Not Currently     Comment: due to poor libido   Other Topics Concern  . None   Social History Narrative   Lives with husband, Neurosurgeon.  1 daughter at [employer]; daughter in [place] (works in Conservator, museum/gallery at [employer]), son in [place]. 8 grandchildren, 1 great-grandchild due 06/2015     Family History:  The patient's family history includes Diabetes in her brother; Heart disease in her brother, father, and mother; Hypertension in her father and mother.   ROS:   Please see the history of present illness.    No complaints.  All other systems reviewed and are negative.   PHYSICAL EXAM:   VS:  BP (!) 170/70 (BP Location: Left Arm)   Pulse 77   Ht 5' (1.524 m)   Wt 163 lb 12.8 oz (74.3 kg)   BMI 31.99 kg/m    GEN: Well nourished, well developed, in no acute distress. Elderly but healthy appearing  HEENT: normal  Neck: no JVD, carotid bruits, or masses Cardiac: RRR; no murmurs, rubs, or gallops,no edema  Respiratory:  clear to auscultation bilaterally, normal work of breathing GI: soft, nontender, nondistended, + BS MS: no deformity or atrophy  Skin: warm and dry, no rash Neuro:  Alert and Oriented x 3, Strength and sensation are intact Psych: euthymic mood, full affect  Wt Readings from Last 3 Encounters:  05/11/16 163 lb 12.8 oz (74.3 kg)  11/28/15 163 lb (73.9 kg)  10/10/15 161 lb 12.8 oz (73.4 kg)      Studies/Labs Reviewed:   EKG:  EKG  Normal sinus rhythm, poor R-wave progression V1 through V3. When compared to prior tracings, no  significant change has occurred.  Recent Labs: 05/14/2015: TSH 3.414 11/21/2015: ALT 35; BUN 23; Creat 1.16; Hemoglobin 13.0; Platelets 170; Potassium 3.9; Sodium 142   Lipid Panel    Component Value Date/Time   CHOL 142 11/21/2015 0840   TRIG 229 (H) 11/21/2015 0840   HDL 38 (L) 11/21/2015 0840   CHOLHDL 3.7 11/21/2015 0840   VLDL 46 (H) 11/21/2015 0840   LDLCALC 58 11/21/2015 0840    Additional studies/ records that were reviewed today include:  Echocardiogram 06/26/15 Study Conclusions  - Left ventricle: The  cavity size was normal. Wall thickness was   increased in a pattern of mild LVH. Systolic function was normal.   The estimated ejection fraction was in the range of 50% to 55%.   Wall motion was normal; there were no regional wall motion   abnormalities. - Aortic valve: Trileaflet; mildly thickened, mildly calcified   leaflets. There was mild regurgitation. - Mitral valve: Calcified annulus. There was mild regurgitation. - Pericardium, extracardiac: A trivial pericardial effusion was   identified posterior to the heart.   ASSESSMENT:    1. Atrial fibrillation with rapid ventricular response ([place])   2. Essential hypertension, benign   3. Chronic anticoagulation      PLAN:  In order of problems listed above:  1. Maintaining normal sinus rhythm. If episodes of atrial fibrillation are occurring, they are asymptomatic. She monitors her vital signs twice daily and noted when the diagnosis was first made that her monitors suggested an irregular rhythm. She watches for the presence of irregular rhythm on her blood pressure monitored. 2. Elevated systolic pressure today. She will probably need intensification of antihypertensive therapy. We need to have target blood pressure less than 150/90 mmHg. I asked her to reduce salt in her diet. Continue to monitor her blood pressure. If she remains above this level medication adjustments will be necessary. Plan 6 puffs follow-up or  earlier if blood pressure medication adjustment is needed. 3. Will switch to Eliquis 2.5 mg twice a day because of a change in her medication formulary.   Medication Adjustments/Labs and Tests Ordered: Current medicines are reviewed at length with the patient today.  Concerns regarding medicines are outlined above.  Medication changes, Labs and Tests ordered today are listed in the Patient Instructions below. Patient Instructions  Medication Instructions:  1) DISCONTINUE Xarelto 2) START Eliquis 2.41m twice daily  Labwork: None  Testing/Procedures: None  Follow-Up: Your physician wants you to follow-up in: 6 months with Dr. STamala Julian You will receive a reminder letter in the mail two months in advance. If you don't receive a letter, please call our office to schedule the follow-up appointment.   Any Other Special Instructions Will Be Listed Below (If Applicable).     If you need a refill on your cardiac medications before your next appointment, please call your pharmacy.      Signed, HSinclair Grooms MD  05/11/2016 5:26 PM    [place]   [id number]: [phone] Fax: [phone]

## 2016-05-11 NOTE — Patient Instructions (Signed)
Medication Instructions:  1) DISCONTINUE Xarelto 2) START Eliquis 2.5mg  twice daily  Labwork: None  Testing/Procedures: None  Follow-Up: Your physician wants you to follow-up in: 6 months with Dr. Tamala Julian. You will receive a reminder letter in the mail two months in advance. If you don't receive a letter, please call our office to schedule the follow-up appointment.   Any Other Special Instructions Will Be Listed Below (If Applicable).     If you need a refill on your cardiac medications before your next appointment, please call your pharmacy.

## 2016-05-18 ENCOUNTER — Telehealth: Payer: Self-pay

## 2016-05-18 NOTE — Telephone Encounter (Signed)
Spoke with Connie [name]  and both he  And patient want her to be taking Xarelto, not Eliquis. Apparently ,it is a covered drug at a [employer]. Can we switch back to Xarelto 15mg  daily?

## 2016-05-19 ENCOUNTER — Other Ambulatory Visit: Payer: Self-pay

## 2016-05-19 MED ORDER — RIVAROXABAN 15 MG PO TABS: 15.0000 mg | ORAL_TABLET | Freq: Every day | ORAL | 3 refills | Status: DC

## 2016-05-19 NOTE — Telephone Encounter (Signed)
Switch to Xarelto is okay.

## 2016-05-19 NOTE — Telephone Encounter (Signed)
Spoke with husband to let him know Connie [name] can go back to Xarelto 15mg . It is preferred on [employer] M/C.

## 2016-05-20 ENCOUNTER — Other Ambulatory Visit: Payer: Self-pay | Admitting: Family Medicine

## 2016-05-26 ENCOUNTER — Other Ambulatory Visit: Payer: Self-pay | Admitting: Family Medicine

## 2016-05-26 DIAGNOSIS — N3941 Urge incontinence: Secondary | ICD-10-CM

## 2016-05-29 ENCOUNTER — Other Ambulatory Visit: Payer: Self-pay | Admitting: Family Medicine

## 2016-05-29 DIAGNOSIS — N3941 Urge incontinence: Secondary | ICD-10-CM

## 2016-05-29 NOTE — Telephone Encounter (Signed)
im not sure why it was denied on 1/23. I refilled med today for #30 no refills

## 2016-05-29 NOTE — Telephone Encounter (Signed)
Is this okay to refill? Pt has an appt 2/12

## 2016-05-29 NOTE — Telephone Encounter (Signed)
I'd be fine with refilling #30, no refills. Looking at the chart, it looks like something occurred 1/23--request for same med was denied (by Louretta Shorten), but because of re-order.  Was it refilled then???

## 2016-06-09 ENCOUNTER — Other Ambulatory Visit: Payer: Medicare HMO

## 2016-06-09 DIAGNOSIS — I10 Essential (primary) hypertension: Secondary | ICD-10-CM

## 2016-06-09 DIAGNOSIS — Z7901 Long term (current) use of anticoagulants: Secondary | ICD-10-CM

## 2016-06-09 DIAGNOSIS — Z5181 Encounter for therapeutic drug level monitoring: Secondary | ICD-10-CM | POA: Diagnosis not present

## 2016-06-09 DIAGNOSIS — I4891 Unspecified atrial fibrillation: Secondary | ICD-10-CM | POA: Diagnosis not present

## 2016-06-09 DIAGNOSIS — E7800 Pure hypercholesterolemia, unspecified: Secondary | ICD-10-CM | POA: Diagnosis not present

## 2016-06-09 LAB — LIPID PANEL
Cholesterol: 155 mg/dL (ref <200)
HDL: 41 mg/dL — ABNORMAL LOW (ref >50)
LDL Cholesterol: 79 mg/dL (ref <100)
Total CHOL/HDL Ratio: 3.8 Ratio (ref <5.0)
Triglycerides: 174 mg/dL — ABNORMAL HIGH (ref <150)
VLDL: 35 mg/dL — ABNORMAL HIGH (ref <30)

## 2016-06-09 LAB — CBC WITH DIFFERENTIAL/PLATELET
Basophils Absolute: 0 cells/uL (ref 0–200)
Basophils Relative: 0 %
Eosinophils Absolute: 100 cells/uL (ref 15–500)
Eosinophils Relative: 2 %
HCT: 39.4 % (ref 35.0–45.0)
Hemoglobin: 13.2 g/dL (ref 11.7–15.5)
Lymphocytes Relative: 30 %
Lymphs Abs: 1500 cells/uL (ref 850–3900)
MCH: 30.8 pg (ref 27.0–33.0)
MCHC: 33.5 g/dL (ref 32.0–36.0)
MCV: 92.1 fL (ref 80.0–100.0)
MPV: 11.4 fL (ref 7.5–12.5)
Monocytes Absolute: 400 cells/uL (ref 200–950)
Monocytes Relative: 8 %
Neutro Abs: 3000 cells/uL (ref 1500–7800)
Neutrophils Relative %: 60 %
Platelets: 190 10*3/uL (ref 140–400)
RBC: 4.28 MIL/uL (ref 3.80–5.10)
RDW: 14.2 % (ref 11.0–15.0)
WBC: 5 10*3/uL (ref 4.0–10.5)

## 2016-06-09 LAB — COMPREHENSIVE METABOLIC PANEL
ALT: 16 U/L (ref 6–29)
AST: 19 U/L (ref 10–35)
Albumin: 4 g/dL (ref 3.6–5.1)
Alkaline Phosphatase: 71 U/L (ref 33–130)
BUN: 30 mg/dL — ABNORMAL HIGH (ref 7–25)
CO2: 28 mmol/L (ref 20–31)
Calcium: 10 mg/dL (ref 8.6–10.4)
Chloride: 105 mmol/L (ref 98–110)
Creat: 1.13 mg/dL — ABNORMAL HIGH (ref 0.60–0.88)
Glucose, Bld: 91 mg/dL (ref 65–99)
Potassium: 4 mmol/L (ref 3.5–5.3)
Sodium: 141 mmol/L (ref 135–146)
Total Bilirubin: 0.5 mg/dL (ref 0.2–1.2)
Total Protein: 6.1 g/dL (ref 6.1–8.1)

## 2016-06-09 LAB — TSH: TSH: 3.41 mIU/L

## 2016-06-10 ENCOUNTER — Other Ambulatory Visit: Payer: Self-pay | Admitting: Family Medicine

## 2016-06-10 DIAGNOSIS — E7800 Pure hypercholesterolemia, unspecified: Secondary | ICD-10-CM

## 2016-06-14 NOTE — Progress Notes (Signed)
Chief Complaint  Patient presents with  . Medicare Wellness    nonfasting AWV/med check with pelvic. Doesn't feel like her energy leverl ever came back up after pneumonia last year. Tolderdine-can only get 30 from insurance. Having stomach discomfort;gas pains/hard stomach-not sure why. Are there any new meds for urinary frequency?     Connie [name] is a 83 y.o. female who presents for annual physical exam, Medicare wellness visit and follow-up on chronic medical conditions.  She has the following concerns:  She has been having some stomach discomfort in her lower abdomen, sometimes in upper abdomen. It doesn't seem to be related to eating. She has noticed increased gas, and feels better when she passes gas. She stopped eating brocolli, which helped initially.  She uses Lactaid milk. She doesn't have much cheese. Infrequent constipation, just once a month. No blood or mucus in the stool.  Atrial Fibrillation:  She last saw Dr. Tamala Julian in January.  Her BP was a little elevated at that time; he recommends <150/90 as goal.  She denies bleeding, bruising, side effects of medications. She continues to take Xarelto.  She doesn't feel when she is in atrial fibrillation, but her blood pressure monitor can tell.  She hasn't had any since April that she is aware of (not noted on her BP checks). She has been taking Tylenol Arthritis 2 twice daily, but discomfort is tolerable (feet, hands, back); not as well controlled as with Celebrex, but Dr. Tamala Julian agrees she should not use this while on blood thinners.   Hypertension follow-up: Recent blood pressures have been running 124-148/65-78. Had been higher than this (more 150's-160's prior to last visit with Dr Tamala Julian. Goal BP per Dr. Tamala Julian is <150/90.  She hasn't had any irregular heartbeat noted on her monitor (which indicates when she is in afib). Denies dizziness, headaches, chest pain. Denies side effects of medications. Pulse has been normal, hasn't needed  any prn meds for rate control. She has some ongoing fatigue since her bout with pneumonia. She was able to walk the [employer] 5K in October without problems.  Gout: She hasn't had any recurrent gout of her great toe, on her current dose of allopurinol ('200mg'$ ).   Hyperlipidemia: She had fasting labs prior to her visit. She hasn't tolerated fish oil in the past (due to belching). She doesn't fry at home, but does sometimes order fried food (just french fries, no other fried foods, or has fried fish when at the beach). She tries to follow a lowfat, low cholesterol diet. She is compliant with her medications and denies any side effects.  Urge urinary incontinence--Detrol LA dose was increased at her visit in July, from 2 to '4mg'$ . She doesn't think she ever went up to the '4mg'$  tablet, maybe did for a week, doesn't recall. Has been taking just the '2mg'$ .  She continues to get up at least 2x/night, usually 3, sometimes up to 4 times. Denies any significant side effects at the '2mg'$  dose. Denies any dysuria, odor or hematuria.  She is having more accidents/incontinence when she leaves the house, wearing Poise pads daily.  We previously said we would try Myrbetriq if not getting improvement in symptoms at the higher Detrol LA dose. She apparently hasn't really tried the higher dose yet. Per our records, recently refilled 30d of the '4mg'$ --she thinks she got the '2mg'$ . She will check with pharmacy to verify what she has been getting filled.  GERD--She was asked to change Prilosec to prn, rather than  taking nightly (after stopping Celebrex).  She rarely needs this, just a few times in the last month, prn for reflux.  Edema--usually more frequent with warmer temps. Not needing to use the lasix.   Osteopenia/low bone mass--She previously took Fosamax (for 10 years, per pt). She stopped it prior to dental work, about 5 years ago. She doesn't currently get any weight-bearing exercise other than  walking--advised at her last visit, but admits she hasn't started any weight-bearing exercise yet. She takes calcium +D--see med list. She had repeat DEXA in 09/2015 which showed mild thinning of bones--no significant change from last check in 2013. She was told to continue calcium, Vitamin D and start weight-bearing exercise  Immunization History  Administered Date(s) Administered  . Hepatitis A 11/06/1995, 08/25/1996  . IPV 11/06/1995  . Influenza Split 02/02/2011, 02/02/2012, 01/19/2013  . Influenza, High Dose Seasonal PF 02/24/2016  . Influenza-Unspecified 01/16/2014, 02/02/2015  . Meningococcal Polysaccharide 11/06/1995  . Pneumococcal Conjugate-13 05/01/2013  . Pneumococcal Polysaccharide-23 11/06/1995, 06/04/2005  . Tdap 11/06/1995, 06/04/2005, 06/16/2013, 12/06/2014  . Typhoid Live 11/15/1995, 04/29/2010, 06/10/2015  . Yellow Fever 04/04/2012  . Zoster 06/04/2005   Last Pap smear: 6/09  Last mammogram: 09/2015 Last colonoscopy: 12/2008  Last DEXA: 09/2015 Dentist: regular, 1-2 times/year (every 9 months) Ophtho: yearly  Exercise: walking 1.5 miles (40 minutes) 3-4 times a week. No weight-bearing exercise.   Other doctors caring for patient include: Ophtho: Dr. [place] Dentist: Dr. Philipp Ovens Oncologist: Dr. Humphrey Rolls (she has been released from her care) Derm: Dr. Ledell Peoples PA, Lennie Odor (will be changing for insurance reasons) GI: Dr. Wynetta Emery Podiatrist: Dr. Cannon Kettle Cardiologists: Dr. Tamala Julian, Dr. Rayann Heman   Depression screen:  Negative Fall screen: negative Functionatl status screen: no significant problems (trouble with remembering names, chronic/unchanged)--see full questionnaire in epic. She cannot recall which dose of detrol she is taking (which is upsetting her today)  End of Life Discussion:  Patient has a living will and medical power of attorney  Past Medical History:  Diagnosis Date  . Abnormal brain MRI 8/01   small hemorrhagic stroke and possible  cavernous hemangioma (Dr. Erling Cruz)  . Adenomatous colon polyp 1/03  . Arthritis    OA spine, hands  . Breast cancer ([place]) 2001   R breast (T3N1, ER/PR+, HER-2 +) s/p mastectomy, chemo and chest wall irradiation (Dr. Truddie Coco)  . DDD (degenerative disc disease), lumbar   . GERD (gastroesophageal reflux disease)   . Hearing loss 2012   wears hearing aids  . Herpes zoster 2003  . Hyperlipidemia   . Hypertension   . Onychomycosis 2003, 2008   treated with Lamisil  . Osteopenia    (DEXA's done by Dr. Truddie Coco); prev took Fosamax.  No change in DEXA after off Fosamax x 2 years  . Ovarian cyst 12/2008   right  . Postmenopausal bleeding 2000   benign EMB (Dr. Raphael Gibney)  . Urge urinary incontinence     Past Surgical History:  Procedure Laterality Date  . CATARACT EXTRACTION  summer 2011   bilateral  . CHOLECYSTECTOMY, LAPAROSCOPIC  1/05  . COLONOSCOPY  12/17/08   normal (Dr. Wynetta Emery)  . COLONOSCOPY W/ POLYPECTOMY  05/23/01   adenomatous polyp  . modified mastectomy and tram flap reconstruction  2001   R breast  . TONSILLECTOMY    . tram flap removal     problems with flap after radiation  . TUBAL LIGATION      Social History   Social History  . Marital status: Married  Spouse name: N/A  . Number of children: 3  . Years of education: N/A   Occupational History  . Not on file.   Social History Main Topics  . Smoking status: Never Smoker  . Smokeless tobacco: Never Used  . Alcohol use No  . Drug use: No  . Sexual activity: Not Currently     Comment: due to poor libido   Other Topics Concern  . Not on file   Social History Narrative   Lives with husband, cat.  1 daughter at [employer]; daughter in [place] (works in Conservator, museum/gallery at [employer]), son in [place]. 8 grandchildren, 2 great-grandchild    Family History  Problem Relation Age of Onset  . Heart disease Mother   . Hypertension Mother   . Heart disease Father   . Hypertension Father   . Diabetes Brother   . Heart  disease Brother   . Drug abuse Neg Hx   . Cancer Neg Hx     Outpatient Encounter Prescriptions as of 06/15/2016  Medication Sig Note  . acetaminophen (TYLENOL) 650 MG CR tablet Take 1,300 mg by mouth 2 (two) times daily.   Marland Kitchen allopurinol (ZYLOPRIM) 100 MG tablet TAKE TWO TABLETS BY MOUTH DAILY   . Calcium Carbonate-Vitamin D (CALTRATE 600+D) 600-400 MG-UNIT per tablet Take 1 tablet by mouth daily. Takes one in the am everyday and Monday,wednesday and Friday take one at night also   . diltiazem (CARDIZEM CD) 120 MG 24 hr capsule Take 1 capsule (120 mg total) by mouth daily.   Marland Kitchen lisinopril (PRINIVIL,ZESTRIL) 40 MG tablet Take 20 mg by mouth daily.   . Multiple Vitamins-Minerals (CENTRUM SILVER PO) Take 1 tablet by mouth daily.     . pravastatin (PRAVACHOL) 20 MG tablet TAKE ONE TABLET BY MOUTH DAILY   . Rivaroxaban (XARELTO) 15 MG TABS tablet Take 1 tablet (15 mg total) by mouth daily with supper. Discontinue Eliquis '5mg'$    . tolterodine (DETROL LA) 2 MG 24 hr capsule Take 2 mg by mouth daily. 06/15/2016: She believes she is taking '2mg'$ ; last refill sent to pharmacy was for '4mg'$ . She will clarify  . calcium carbonate (TUMS - DOSED IN MG ELEMENTAL CALCIUM) 500 MG chewable tablet Chew 1 tablet by mouth daily as needed for indigestion or heartburn. Reported on 07/10/2015 11/28/2015: Uses prn heartburn, every few months  . colchicine 0.6 MG tablet Use as directed. Take two tablets by mouth at start of gout flare up. Then one hour later take one tablet by mouth.   . diltiazem (CARDIZEM) 30 MG tablet Cardizem '30mg'$  -- take 1 tablet by mouth every 4 hours AS NEEDED for heart rate >100 as long as blood pressure >100. (Patient not taking: Reported on 06/15/2016)   . furosemide (LASIX) 20 MG tablet Take 0.5-1 tablets (10-20 mg total) by mouth daily as needed for fluid or edema. (Patient not taking: Reported on 06/15/2016) 05/23/2015: Rarely uses; swelling was worse in the heat  . omeprazole (PRILOSEC) 20 MG capsule Take  1 capsule (20 mg total) by mouth daily. Reported on 05/23/2015 (Patient not taking: Reported on 06/15/2016) 06/15/2016: Uses prn only, rarely  . tolterodine (DETROL LA) 4 MG 24 hr capsule TAKE ONE CAPSULE BY MOUTH DAILY (Patient not taking: Reported on 06/15/2016)    No facility-administered encounter medications on file as of 06/15/2016.     Allergies  Allergen Reactions  . Cephalexin Itching  . Ciprofloxacin Itching  . Diclofenac Nausea Only    Upset stomach  .  Shellfish-Derived Products Nausea And Vomiting    ROS: The patient denies anorexia, fever, weight changes, headaches, vision changes, ear pain, sore throat, breast concerns, chest pain, dizziness, syncope, dyspnea on exertion, cough, swelling, nausea, vomiting, diarrhea, constipation, melena, hematochezia, indigestion/heartburn, hematuria, dysuria, vaginal bleeding, discharge, odor or itch, genital lesions, weakness, tremor, suspicious skin lesions, depression, anxiety, abnormal bleeding/bruising, or enlarged lymph nodes.  ongoing numbness in fingers and toes since her chemo (most in the right hand); no weakness. +urge urinary incontinence (see HPI). +joint pains in hands/fingers/feet--treated with tylenol with adequate pain relief +hearing loss--has hearing aids.  Denies any thyroid symptoms Some persistent fatigue since pneumonia Abdominal bloating/pain per HPI. Intermittent loose stools and gas. Foot pain--now under care of podiatrist; improved.   PHYSICAL EXAM:  BP 132/72 (BP Location: Left Arm, Patient Position: Sitting, Cuff Size: Normal)   Pulse 72   Ht 5' (1.524 m)   Wt 160 lb 12.8 oz (72.9 kg)   BMI 31.40 kg/m    General Appearance:  Alert, cooperative, no distress, appears stated age   Head:  Normocephalic, without obvious abnormality, atraumatic   Eyes:  PERRL, conjunctiva/corneas clear, EOM's intact, fundi benign   Ears:  Wearing hearing aids. TM's and EAC's were normal  Nose:  Nares normal,  mucosa normal, no drainage or sinus tenderness   Throat:  Lips, mucosa, and tongue normal; teeth and gums normal.   Neck:  Supple, no lymphadenopathy; thyroid: no enlargement/tenderness/nodules; no carotid bruit or JVD   Back:  Spine nontender, no curvature, ROM normal, no CVA tenderness   Lungs:  Clear to auscultation bilaterally without wheezes, rales or ronchi; respirations unlabored   Chest Wall:  No tenderness or deformity   Heart:  Regular rate and rhythm, S1 and S2 normal, no murmur, rub or gallop. Occasional ectopic beats  Breast Exam:  R breast absent, well healed surgical scars, nontender.Egg-sized soft tissue swelling at proximal part of right pectoralis tendon--she feels is related to how she wears the compression sleeve on that arm, varies in size.  Nontender.  Feels cystic. L breast normal-- no masses, or nipple discharge. nipple is mildly inverted, chronic. No axillary lymphadenopathy   Abdomen:  Soft, non-tender, nondistended, normoactive bowel sounds, no masses, no hepatosplenomegaly. Very mild suprapubic tenderness  Genitalia:  Normal external genitalia without lesions. BUS and vagina normal, mild atrophic changes; no cervical motion tenderness. No abnormal vaginal discharge. Uterus normal size, nontender, no masses. No adnexal masses or tenderness; exam is somewhat limited by body habitus. Pap not performed   Rectal:  Normal tone, no masses or tenderness; guaiac negative stool   Extremities:  No clubbing or cyanosis. Bunion deformities noted bilaterally, R>L, slight hammertoe deformity of the R 2nd toe.  No tenderness. No edema.  Pulses:  2+ and symmetric all extremities   Skin:  Skin color, texture, turgor normal, no rashes or lesions. Many angiomas on abdomen.  Two new nevi on top of the right foot (larger slightly irregular in shape, both uniform in color)  Lymph nodes:  Cervical, supraclavicular, and axillary nodes normal    Neurologic:  CNII-XII intact, normal strength, sensation and gait; reflexes 2+ and symmetric throughout    Psych:   Normal mood, affect, hygiene and grooming       Chemistry      Component Value Date/Time   NA 141 06/09/2016 0857   NA 141 11/11/2012 1020   K 4.0 06/09/2016 0857   K 4.1 11/11/2012 1020   CL 105 06/09/2016 0857  CO2 28 06/09/2016 0857   CO2 26 11/11/2012 1020   BUN 30 (H) 06/09/2016 0857   BUN 31.6 (H) 11/11/2012 1020   CREATININE 1.13 (H) 06/09/2016 0857   CREATININE 1.5 (H) 11/11/2012 1020      Component Value Date/Time   CALCIUM 10.0 06/09/2016 0857   CALCIUM 10.1 11/11/2012 1020   ALKPHOS 71 06/09/2016 0857   ALKPHOS 78 11/11/2012 1020   AST 19 06/09/2016 0857   AST 19 11/11/2012 1020   ALT 16 06/09/2016 0857   ALT 17 11/11/2012 1020   BILITOT 0.5 06/09/2016 0857   BILITOT 0.43 11/11/2012 1020     Fasting glucose 91  Lab Results  Component Value Date   WBC 5.0 06/09/2016   HGB 13.2 06/09/2016   HCT 39.4 06/09/2016   MCV 92.1 06/09/2016   PLT 190 06/09/2016   Lab Results  Component Value Date   CHOL 155 06/09/2016   HDL 41 (L) 06/09/2016   LDLCALC 79 06/09/2016   TRIG 174 (H) 06/09/2016   CHOLHDL 3.8 06/09/2016   Lab Results  Component Value Date   TSH 3.41 06/09/2016     ASSESSMENT/PLAN:  Annual physical exam  Encounter for Medicare annual wellness exam  Essential hypertension, benign - controlled  Pure hypercholesterolemia - TG remains mildly elevated; discussed diet, exercise weight loss. Consider increasing statin dose if not improved on next check  Urge incontinence of urine - pt to check dose of Detrol, and trial of '4mg'$  if she hasn't done this yet - Plan: POCT Urinalysis Dipstick  Atrial fibrillation with rapid ventricular response (HCC) - paroxysmal. Currently in sinus rhythm. Continue anticoagulation  Long term current use of anticoagulant therapy  History of breast cancer  Gout of big  toe - continue allopurinol  Osteopenia, unspecified location - start regular weight-bearing exercise; continue Ca+D. DEXA due again 09/2017  Immunization due - Plan: Pneumococcal polysaccharide vaccine 23-valent greater than or equal to 2yo subcutaneous/IM  Bloating - reviewed diet, trial of Beano prior to meals vs Simethicone prn. Trial probiotics. Needs further eval if persists    Pneumovax boostertoday Urine dip due to suprapubic pain and bloating--normal.   Consider lovastatin bump to '40mg'$  (counseled re: diet, TG above goal--prefers to wait until next check) Declines change--leaving for a cruise. Counseled re: food choices and portion control on cruise, and need for weight loss.   Discussed monthly self breast exams and yearly mammograms; at least 30 minutes of aerobic activity at least 5 days/week; weight-bearing exercise at least 2x/wk; proper sunscreen use reviewed; healthy diet, including goals of calcium and vitamin D intake and alcohol recommendations (less than or equal to 1 drink/day) reviewed; regular seatbelt use; changing batteries in smoke detectors. Immunization recommendations discussed--pneuovax booster given; continue yearly high dose flu shots. Shingrix recommended. Colonoscopy recommendations reviewed--UTD. F/u not likely needed due to age, unless problems. Pap smear not indicated due to age and low risk.  DEXA--repeat 09/2017   She has a living will and a healthcare power of attorney. Full Code, Full Care. Encouraged to provide [place] a copy.    F/u in 6 months, labs prior:  c-met, lipid, uric acid    Continue to limit gas-producing foods in your diet (beans, brocolli, greens, etc).  If there are specific foods that trigger the pain and gas, try taking Bean-o ahead of time (prior to eating the meal).  Otherwise, try using simethicone (in Gas-X) as needed for pain and bloating. It may be worth trying a 2-4 week course of  probiotics (and continue them long-term if  helpful). Continue to use Lactaid and avoid dairy (if this is a trigger in your diet) such as cheese, creamy sauces/dressings, ice cream, etc).   Look at your bottles at home, and/or check with the pharmacy to see how long you have been taking the 62m.  If you have been taking it since July, and you don't notice any benefit, we can try changing to Myrbetriq.  If you really only started the 462mwith the last refill 2 weeks ago, we can give it a little longer.  Medicare Attestation I have personally reviewed: The patient's medical and social history Their use of alcohol, tobacco or illicit drugs Their current medications and supplements The patient's functional ability including ADLs,fall risks, home safety risks, cognitive, and hearing and visual impairment Diet and physical activities Evidence for depression or mood disorders  The patient's weight, height, and BMI have been recorded in the chart.  I have made referrals, counseling, and provided education to the patient based on review of the above and I have provided the patient with a written personalized care plan for preventive services.     Byrdie Miyazaki A, MD   06/14/2016

## 2016-06-15 ENCOUNTER — Encounter: Payer: Self-pay | Admitting: Family Medicine

## 2016-06-15 ENCOUNTER — Ambulatory Visit (INDEPENDENT_AMBULATORY_CARE_PROVIDER_SITE_OTHER): Payer: Medicare HMO | Admitting: Family Medicine

## 2016-06-15 VITALS — BP 132/72 | HR 72 | Ht 60.0 in | Wt 160.8 lb

## 2016-06-15 DIAGNOSIS — Z0000 Encounter for general adult medical examination without abnormal findings: Secondary | ICD-10-CM

## 2016-06-15 DIAGNOSIS — R140 Abdominal distension (gaseous): Secondary | ICD-10-CM

## 2016-06-15 DIAGNOSIS — M8580 Other specified disorders of bone density and structure, unspecified site: Secondary | ICD-10-CM | POA: Diagnosis not present

## 2016-06-15 DIAGNOSIS — N3941 Urge incontinence: Secondary | ICD-10-CM | POA: Diagnosis not present

## 2016-06-15 DIAGNOSIS — I10 Essential (primary) hypertension: Secondary | ICD-10-CM

## 2016-06-15 DIAGNOSIS — E7800 Pure hypercholesterolemia, unspecified: Secondary | ICD-10-CM | POA: Diagnosis not present

## 2016-06-15 DIAGNOSIS — M109 Gout, unspecified: Secondary | ICD-10-CM

## 2016-06-15 DIAGNOSIS — I4891 Unspecified atrial fibrillation: Secondary | ICD-10-CM

## 2016-06-15 DIAGNOSIS — Z23 Encounter for immunization: Secondary | ICD-10-CM

## 2016-06-15 DIAGNOSIS — Z7901 Long term (current) use of anticoagulants: Secondary | ICD-10-CM | POA: Diagnosis not present

## 2016-06-15 DIAGNOSIS — Z853 Personal history of malignant neoplasm of breast: Secondary | ICD-10-CM

## 2016-06-15 LAB — POCT URINALYSIS DIPSTICK
Bilirubin, UA: NEGATIVE
Blood, UA: NEGATIVE
Glucose, UA: NEGATIVE
Ketones, UA: NEGATIVE
Leukocytes, UA: NEGATIVE
Nitrite, UA: NEGATIVE
Protein, UA: NEGATIVE
Spec Grav, UA: >=1.03
Urobilinogen, UA: NEGATIVE
pH, UA: 6

## 2016-06-15 NOTE — Patient Instructions (Addendum)
HEALTH MAINTENANCE RECOMMENDATIONS:  It is recommended that you get at least 30 minutes of aerobic exercise at least 5 days/week (for weight loss, you may need as much as 60-90 minutes). This can be any activity that gets your heart rate up. This can be divided in 10-15 minute intervals if needed, but try and build up your endurance at least once a week.  Weight bearing exercise is also recommended twice weekly.  Eat a healthy diet with lots of vegetables, fruits and fiber.  "Colorful" foods have a lot of vitamins (ie green vegetables, tomatoes, red peppers, etc).  Limit sweet tea, regular sodas and alcoholic beverages, all of which has a lot of calories and sugar.  Up to 1 alcoholic drink daily may be beneficial for women (unless trying to lose weight, watch sugars).  Drink a lot of water.  Calcium recommendations are 1200-1500 mg daily (1500 mg for postmenopausal women or women without ovaries), and vitamin D 1000 IU daily.  This should be obtained from diet and/or supplements (vitamins), and calcium should not be taken all at once, but in divided doses.  Monthly self breast exams and yearly mammograms for women over the age of 27 is recommended.  Sunscreen of at least SPF 30 should be used on all sun-exposed parts of the skin when outside between the hours of 10 am and 4 pm (not just when at beach or pool, but even with exercise, golf, tennis, and yard work!)  Use a sunscreen that says "broad spectrum" so it covers both UVA and UVB rays, and make sure to reapply every 1-2 hours.  Remember to change the batteries in your smoke detectors when changing your clock times in the spring and fall.  Use your seat belt every time you are in a car, and please drive safely and not be distracted with cell phones and texting while driving.    Connie [name] , Thank you for taking time to come for your Medicare Wellness Visit. I appreciate your ongoing commitment to your health goals. Please review the  following plan we discussed and let me know if I can assist you in the future.   These are the goals we discussed: Goals    None      This is a list of the screening recommended for you and due dates:  Health Maintenance  Topic Date Due  . Tetanus Vaccine  12/05/2024  . Flu Shot  Completed  . DEXA scan (bone density measurement)  Completed  . Shingles Vaccine  Completed  . Pneumonia vaccines  Completed    Pneumovax booster was given today. Next bone density test is due 09/2017 (2 years from the last). Last colonoscopy was 12/2008. Not likely to be done again due to age, unless you have problems/concerns. Next mammogram is due 09/2016.  Look at your bottles at home, and/or check with the pharmacy to see how long you have been taking the 4mg .  If you have been taking it since July, and you don't notice any benefit, we can try changing to Myrbetriq.  If you really only started the 4mg  with the last refill 2 weeks ago, we can give it a little longer. Call us and let us know which medication to refill for 90d supply before your trip.  Continue to limit gas-producing foods in your diet (beans, brocolli, greens, etc).  If there are specific foods that trigger the pain and gas, try taking Bean-o ahead of time (prior to eating the meal).  Otherwise, try using simethicone (in Gas-X) as needed for pain and bloating. It may be worth trying a 2-4 week course of probiotics (and continue them long-term if helpful). Continue to use Lactaid and avoid dairy (if this is a trigger in your diet) such as cheese, creamy sauces/dressings, ice cream, etc).  I recommend getting the new shingles vaccine (Shingrix) when available. You will need to check with your insurance to see if it is covered, and if covered by Medicare Part D, you need to get from the pharmacy rather than our office.  It is a series of 2 injections, spaced 2 months apart.   Please get [place] updated copies of your Living Will and Healthcare power  of attorney.

## 2016-06-17 ENCOUNTER — Telehealth: Payer: Self-pay | Admitting: Family Medicine

## 2016-06-17 NOTE — Telephone Encounter (Signed)
Received a email from [employer] stated this year's forms are ready. This pt was recently seen for her medicare well visit and needs to have form completed. Sending back to be completed, please return to [place].

## 2016-06-22 ENCOUNTER — Telehealth: Payer: Self-pay | Admitting: Family Medicine

## 2016-06-22 NOTE — Telephone Encounter (Signed)
Pt reports she has been taking 4mg  of generic detrol for the last several months.  She prefers to stay on this until after her trip.  Please refill for 90d when pharmacy request comes (last was refilled x 30d).  After her trip, she will let us know if/when she wants to try a different medication

## 2016-06-22 NOTE — Telephone Encounter (Signed)
Pt dropped off power of attorney papers and a rx refill of medicine that you and her discussed she wrote a letter of what she wants, this is our copy pt can be reached at [phone] pt uses [facility] [address] [place] in [place]

## 2016-06-23 ENCOUNTER — Encounter: Payer: Self-pay | Admitting: Sports Medicine

## 2016-06-23 ENCOUNTER — Ambulatory Visit (INDEPENDENT_AMBULATORY_CARE_PROVIDER_SITE_OTHER): Payer: Medicare HMO | Admitting: Sports Medicine

## 2016-06-23 DIAGNOSIS — M21619 Bunion of unspecified foot: Secondary | ICD-10-CM | POA: Diagnosis not present

## 2016-06-23 DIAGNOSIS — M79672 Pain in left foot: Secondary | ICD-10-CM | POA: Diagnosis not present

## 2016-06-23 DIAGNOSIS — Z7901 Long term (current) use of anticoagulants: Secondary | ICD-10-CM

## 2016-06-23 DIAGNOSIS — R69 Illness, unspecified: Secondary | ICD-10-CM | POA: Diagnosis not present

## 2016-06-23 DIAGNOSIS — B351 Tinea unguium: Secondary | ICD-10-CM

## 2016-06-23 DIAGNOSIS — M79671 Pain in right foot: Secondary | ICD-10-CM

## 2016-06-23 NOTE — Progress Notes (Signed)
Subjective: Connie [name] is a 83 y.o. female patient seen today in office with complaint of painful thickened and elongated toenails; unable to trim. Patient denies changes with medical history; on Xalerto for Afib. Patient has no other pedal complaints at this time.   Patient Active Problem List   Diagnosis Date Noted  . Chronic anticoagulation 05/11/2016  . Atrial fibrillation with rapid ventricular response ([place]) 06/24/2015  . Gout 10/30/2013  . Gout of big toe 10/30/2013  . Osteoarthritis of multiple joints 10/30/2013  . Breast cancer, right breast ([place]) 11/25/2012  . Urge incontinence of urine 04/24/2011  . OA (osteoarthritis) 04/24/2011  . Essential hypertension, benign 10/23/2010  . Pure hypercholesterolemia 10/23/2010    Current Outpatient Prescriptions on File Prior to Visit  Medication Sig Dispense Refill  . acetaminophen (TYLENOL) 650 MG CR tablet Take 1,300 mg by mouth 2 (two) times daily.    Marland Kitchen allopurinol (ZYLOPRIM) 100 MG tablet TAKE TWO TABLETS BY MOUTH DAILY 180 tablet 0  . calcium carbonate (TUMS - DOSED IN MG ELEMENTAL CALCIUM) 500 MG chewable tablet Chew 1 tablet by mouth daily as needed for indigestion or heartburn. Reported on 07/10/2015    . Calcium Carbonate-Vitamin D (CALTRATE 600+D) 600-400 MG-UNIT per tablet Take 1 tablet by mouth daily. Takes one in the am everyday and Monday,wednesday and Friday take one at night also    . colchicine 0.6 MG tablet Use as directed. Take two tablets by mouth at start of gout flare up. Then one hour later take one tablet by mouth.    . diltiazem (CARDIZEM CD) 120 MG 24 hr capsule Take 1 capsule (120 mg total) by mouth daily. 90 capsule 3  . furosemide (LASIX) 20 MG tablet Take 0.5-1 tablets (10-20 mg total) by mouth daily as needed for fluid or edema. 90 tablet 0  . lisinopril (PRINIVIL,ZESTRIL) 40 MG tablet Take 20 mg by mouth daily.    . Multiple Vitamins-Minerals (CENTRUM SILVER PO) Take 1 tablet by mouth daily.      Marland Kitchen  omeprazole (PRILOSEC) 20 MG capsule Take 1 capsule (20 mg total) by mouth daily. Reported on 05/23/2015    . pravastatin (PRAVACHOL) 20 MG tablet TAKE ONE TABLET BY MOUTH DAILY 90 tablet 0  . Rivaroxaban (XARELTO) 15 MG TABS tablet Take 1 tablet (15 mg total) by mouth daily with supper. Discontinue Eliquis 5mg  90 tablet 3  . tolterodine (DETROL LA) 4 MG 24 hr capsule TAKE ONE CAPSULE BY MOUTH DAILY 30 capsule 0  . [DISCONTINUED] solifenacin (VESICARE) 5 MG tablet Take 1 tablet (5 mg total) by mouth daily. 90 tablet 0   No current facility-administered medications on file prior to visit.     Allergies  Allergen Reactions  . Cephalexin Itching  . Ciprofloxacin Itching  . Diclofenac Nausea Only    Upset stomach  . Shellfish-Derived Products Nausea And Vomiting    Objective: Physical Exam  General: Well developed, nourished, no acute distress, awake, alert and oriented x 3  Vascular: Dorsalis pedis artery 1/4 bilateral, Posterior tibial artery 1/4 bilateral, skin temperature warm to warm proximal to distal bilateral lower extremities, no varicosities, pedal hair present bilateral.  Neurological: Gross sensation present via light touch bilateral.   Dermatological: Skin is warm, dry, and supple bilateral, Nails 1-10 are tender, long, thick, and discolored with mild subungal debris, no webspace macerations present bilateral, no open lesions present bilateral, minimal reactive keratosis medial aspect of right bunion. No signs of infection bilateral.  Musculoskeletal: Asymptomatic bunion boney  deformities noted bilateral. Muscular strength within normal limits without painon range of motion. No pain with calf compression bilateral.  Assessment and Plan:  Problem List Items Addressed This Visit    None    Visit Diagnoses    Dermatophytosis of nail    -  Primary   Bunion       Foot pain, bilateral       Anticoagulant long-term use         -Examined patient.  -Discussed treatment  options for painful mycotic nails. -Mechanically debrided and reduced mycotic nails with sterile nail nipper and dremel nail file without incident. -Recommend good supportive shoes daily for foot type -Patient to return in 2.5 months for follow up evaluation or sooner if symptoms worsen.  Landis Martins, DPM

## 2016-06-24 ENCOUNTER — Other Ambulatory Visit: Payer: Self-pay | Admitting: Family Medicine

## 2016-06-24 DIAGNOSIS — N3941 Urge incontinence: Secondary | ICD-10-CM

## 2016-06-25 DIAGNOSIS — D2239 Melanocytic nevi of other parts of face: Secondary | ICD-10-CM | POA: Diagnosis not present

## 2016-06-25 DIAGNOSIS — D2271 Melanocytic nevi of right lower limb, including hip: Secondary | ICD-10-CM | POA: Diagnosis not present

## 2016-06-25 DIAGNOSIS — L989 Disorder of the skin and subcutaneous tissue, unspecified: Secondary | ICD-10-CM | POA: Diagnosis not present

## 2016-06-25 DIAGNOSIS — D1801 Hemangioma of skin and subcutaneous tissue: Secondary | ICD-10-CM | POA: Diagnosis not present

## 2016-06-25 DIAGNOSIS — D2272 Melanocytic nevi of left lower limb, including hip: Secondary | ICD-10-CM | POA: Diagnosis not present

## 2016-06-25 DIAGNOSIS — D485 Neoplasm of uncertain behavior of skin: Secondary | ICD-10-CM | POA: Diagnosis not present

## 2016-06-25 DIAGNOSIS — L821 Other seborrheic keratosis: Secondary | ICD-10-CM | POA: Diagnosis not present

## 2016-08-04 ENCOUNTER — Other Ambulatory Visit (HOSPITAL_COMMUNITY): Payer: Self-pay | Admitting: Nurse Practitioner

## 2016-08-17 ENCOUNTER — Other Ambulatory Visit: Payer: Self-pay | Admitting: Family Medicine

## 2016-09-01 DIAGNOSIS — R69 Illness, unspecified: Secondary | ICD-10-CM | POA: Diagnosis not present

## 2016-09-14 ENCOUNTER — Other Ambulatory Visit: Payer: Self-pay | Admitting: Family Medicine

## 2016-09-14 DIAGNOSIS — E7800 Pure hypercholesterolemia, unspecified: Secondary | ICD-10-CM

## 2016-09-22 ENCOUNTER — Ambulatory Visit (INDEPENDENT_AMBULATORY_CARE_PROVIDER_SITE_OTHER): Payer: Medicare HMO | Admitting: Sports Medicine

## 2016-09-22 ENCOUNTER — Encounter: Payer: Self-pay | Admitting: Sports Medicine

## 2016-09-22 DIAGNOSIS — M79671 Pain in right foot: Secondary | ICD-10-CM | POA: Diagnosis not present

## 2016-09-22 DIAGNOSIS — B351 Tinea unguium: Secondary | ICD-10-CM

## 2016-09-22 DIAGNOSIS — M79672 Pain in left foot: Secondary | ICD-10-CM

## 2016-09-22 DIAGNOSIS — Z7901 Long term (current) use of anticoagulants: Secondary | ICD-10-CM

## 2016-09-22 NOTE — Progress Notes (Signed)
Subjective: Connie [name] is a 83 y.o. female patient seen today in office with complaint of painful thickened and elongated toenails; unable to trim. Patient denies changes with medical history; on Xalerto for Afib. Patient has no other pedal complaints at this time.   Patient Active Problem List   Diagnosis Date Noted  . Chronic anticoagulation 05/11/2016  . Atrial fibrillation with rapid ventricular response ([place]) 06/24/2015  . Gout 10/30/2013  . Gout of big toe 10/30/2013  . Osteoarthritis of multiple joints 10/30/2013  . Breast cancer, right breast ([place]) 11/25/2012  . Urge incontinence of urine 04/24/2011  . OA (osteoarthritis) 04/24/2011  . Essential hypertension, benign 10/23/2010  . Pure hypercholesterolemia 10/23/2010    Current Outpatient Prescriptions on File Prior to Visit  Medication Sig Dispense Refill  . acetaminophen (TYLENOL) 650 MG CR tablet Take 1,300 mg by mouth 2 (two) times daily.    Marland Kitchen allopurinol (ZYLOPRIM) 100 MG tablet TAKE TWO TABLETS BY MOUTH DAILY 180 tablet 0  . calcium carbonate (TUMS - DOSED IN MG ELEMENTAL CALCIUM) 500 MG chewable tablet Chew 1 tablet by mouth daily as needed for indigestion or heartburn. Reported on 07/10/2015    . Calcium Carbonate-Vitamin D (CALTRATE 600+D) 600-400 MG-UNIT per tablet Take 1 tablet by mouth daily. Takes one in the am everyday and Monday,wednesday and Friday take one at night also    . CARTIA XT 120 MG 24 hr capsule TAKE 1 CAPSULE (120 MG TOTAL) BY MOUTH DAILY. 90 capsule 2  . colchicine 0.6 MG tablet Use as directed. Take two tablets by mouth at start of gout flare up. Then one hour later take one tablet by mouth.    . furosemide (LASIX) 20 MG tablet Take 0.5-1 tablets (10-20 mg total) by mouth daily as needed for fluid or edema. 90 tablet 0  . lisinopril (PRINIVIL,ZESTRIL) 40 MG tablet Take 20 mg by mouth daily.    . Multiple Vitamins-Minerals (CENTRUM SILVER PO) Take 1 tablet by mouth daily.      Marland Kitchen omeprazole  (PRILOSEC) 20 MG capsule Take 1 capsule (20 mg total) by mouth daily. Reported on 05/23/2015    . pravastatin (PRAVACHOL) 20 MG tablet TAKE ONE TABLET BY MOUTH DAILY 90 tablet 0  . Rivaroxaban (XARELTO) 15 MG TABS tablet Take 1 tablet (15 mg total) by mouth daily with supper. Discontinue Eliquis 5mg  90 tablet 3  . tolterodine (DETROL LA) 4 MG 24 hr capsule TAKE ONE CAPSULE BY MOUTH DAILY 30 capsule 0  . tolterodine (DETROL LA) 4 MG 24 hr capsule TAKE ONE CAPSULE BY MOUTH DAILY 90 capsule 0  . [DISCONTINUED] solifenacin (VESICARE) 5 MG tablet Take 1 tablet (5 mg total) by mouth daily. 90 tablet 0   No current facility-administered medications on file prior to visit.     Allergies  Allergen Reactions  . Cephalexin Itching  . Ciprofloxacin Itching  . Diclofenac Nausea Only    Upset stomach  . Shellfish-Derived Products Nausea And Vomiting    Objective: Physical Exam  General: Well developed, nourished, no acute distress, awake, alert and oriented x 3  Vascular: Dorsalis pedis artery 1/4 bilateral, Posterior tibial artery 1/4 bilateral, skin temperature warm to warm proximal to distal bilateral lower extremities, no varicosities, pedal hair present bilateral.  Neurological: Gross sensation present via light touch bilateral.   Dermatological: Skin is warm, dry, and supple bilateral, Nails 1-10 are tender, long, thick, and discolored with mild subungal debris, no webspace macerations present bilateral, no open lesions present bilateral,  minimal reactive keratosis medial aspect of right bunion. No signs of infection bilateral.  Musculoskeletal: Asymptomatic bunion boney deformities noted bilateral. Muscular strength within normal limits without painon range of motion. No pain with calf compression bilateral.  Assessment and Plan:  Problem List Items Addressed This Visit    None    Visit Diagnoses    Dermatophytosis of nail    -  Primary   Foot pain, bilateral       Anticoagulant  long-term use         -Examined patient.  -Discussed treatment options for painful mycotic nails. -Mechanically debrided and reduced mycotic nails with sterile nail nipper and dremel nail file without incident. -Recommend good supportive shoes daily for foot type -Patient to return in 2.5 to 3 months for follow up evaluation or sooner if symptoms worsen.  Landis Martins, DPM

## 2016-09-28 ENCOUNTER — Other Ambulatory Visit: Payer: Self-pay | Admitting: Family Medicine

## 2016-09-28 DIAGNOSIS — N3941 Urge incontinence: Secondary | ICD-10-CM

## 2016-10-02 ENCOUNTER — Telehealth: Payer: Self-pay | Admitting: Interventional Cardiology

## 2016-10-02 MED ORDER — LISINOPRIL 40 MG PO TABS: 20.0000 mg | ORAL_TABLET | Freq: Every day | ORAL | 1 refills | Status: DC

## 2016-10-02 NOTE — Telephone Encounter (Signed)
New Message      *STAT* If patient is at the pharmacy, call can be transferred to refill team.   1. Which medications need to be refilled? (please list name of each medication and dose if known)  lisinopril (PRINIVIL,ZESTRIL) 40 MG tablet Take 20 mg by mouth daily.     2. Which pharmacy/location (including street and city if local pharmacy) is medication to be sent to? [facility] [address]   3. Do they need a 30 day or 90 day supply? [place]

## 2016-10-02 NOTE — Telephone Encounter (Signed)
Pt's medication was sent to pt's pharmacy as requested. Confirmation received.  °

## 2016-11-08 NOTE — Progress Notes (Signed)
Cardiology Office Note    Date:  11/10/2016   ID:  Connie [name], DOB 10-16-1927, MRN [id number]  PCP:  Rita Ohara, MD  Cardiologist: Sinclair Grooms, MD   Chief Complaint  Patient presents with  . Atrial Fibrillation    History of Present Illness:  Connie [name] is a 83 y.o. female  follow-up of paroxysmal Atrial Fibrillation, history of hypertension, and hyperlipidemia.  Atrial fibrillation occurred in the setting of ambulatory pneumonia. She had spontaneous conversion. She is on anticoagulation therapy since that time. She does not believe she has had recurrences of atrial fibrillation since February 2017. No bleeding complications.  She denies chest pain, orthopnea, PND, edema, and syncope. No blood in her urine or stool.  Past Medical History:  Diagnosis Date  . Abnormal brain MRI 8/01   small hemorrhagic stroke and possible cavernous hemangioma (Dr. Erling Cruz)  . Adenomatous colon polyp 1/03  . Arthritis    OA spine, hands  . Breast cancer ([place]) 2001   R breast (T3N1, ER/PR+, HER-2 +) s/p mastectomy, chemo and chest wall irradiation (Dr. Truddie Coco)  . DDD (degenerative disc disease), lumbar   . GERD (gastroesophageal reflux disease)   . Hearing loss 2012   wears hearing aids  . Herpes zoster 2003  . Hyperlipidemia   . Hypertension   . Onychomycosis 2003, 2008   treated with Lamisil  . Osteopenia    (DEXA's done by Dr. Truddie Coco); prev took Fosamax.  No change in DEXA after off Fosamax x 2 years  . Ovarian cyst 12/2008   right  . Postmenopausal bleeding 2000   benign EMB (Dr. Raphael Gibney)  . Urge urinary incontinence     Past Surgical History:  Procedure Laterality Date  . CATARACT EXTRACTION  summer 2011   bilateral  . CHOLECYSTECTOMY, LAPAROSCOPIC  1/05  . COLONOSCOPY  12/17/08   normal (Dr. Wynetta Emery)  . COLONOSCOPY W/ POLYPECTOMY  05/23/01   adenomatous polyp  . modified mastectomy and tram flap reconstruction  2001   R breast  . TONSILLECTOMY    . tram  flap removal     problems with flap after radiation  . TUBAL LIGATION      Current Medications: Outpatient Medications Prior to Visit  Medication Sig Dispense Refill  . acetaminophen (TYLENOL) 650 MG CR tablet Take 1,300 mg by mouth 2 (two) times daily.    Marland Kitchen allopurinol (ZYLOPRIM) 100 MG tablet TAKE TWO TABLETS BY MOUTH DAILY 180 tablet 0  . calcium carbonate (TUMS - DOSED IN MG ELEMENTAL CALCIUM) 500 MG chewable tablet Chew 1 tablet by mouth daily as needed for indigestion or heartburn. Reported on 07/10/2015    . Calcium Carbonate-Vitamin D (CALTRATE 600+D) 600-400 MG-UNIT per tablet Take 1 tablet by mouth daily. Takes one in the am everyday and Monday,wednesday and Friday take one at night also    . CARTIA XT 120 MG 24 hr capsule TAKE 1 CAPSULE (120 MG TOTAL) BY MOUTH DAILY. 90 capsule 2  . colchicine 0.6 MG tablet Use as directed. Take two tablets by mouth at start of gout flare up. Then one hour later take one tablet by mouth.    . furosemide (LASIX) 20 MG tablet Take 0.5-1 tablets (10-20 mg total) by mouth daily as needed for fluid or edema. 90 tablet 0  . lisinopril (PRINIVIL,ZESTRIL) 40 MG tablet Take 0.5 tablets (20 mg total) by mouth daily. 90 tablet 1  . Multiple Vitamins-Minerals (CENTRUM SILVER PO) Take 1 tablet by mouth  daily.      . omeprazole (PRILOSEC) 20 MG capsule Take 1 capsule (20 mg total) by mouth daily. Reported on 05/23/2015    . pravastatin (PRAVACHOL) 20 MG tablet TAKE ONE TABLET BY MOUTH DAILY 90 tablet 0  . Rivaroxaban (XARELTO) 15 MG TABS tablet Take 1 tablet (15 mg total) by mouth daily with supper. Discontinue Eliquis 48m 90 tablet 3  . tolterodine (DETROL LA) 4 MG 24 hr capsule TAKE ONE CAPSULE BY MOUTH DAILY 30 capsule 0  . tolterodine (DETROL LA) 4 MG 24 hr capsule TAKE ONE CAPSULE BY MOUTH DAILY 90 capsule 0   No facility-administered medications prior to visit.      Allergies:   Cephalexin; Ciprofloxacin; Diclofenac; and Shellfish-derived products    Social History   Social History  . Marital status: Married    Spouse name: N/A  . Number of children: 3  . Years of education: N/A   Social History Main Topics  . Smoking status: Never Smoker  . Smokeless tobacco: Never Used  . Alcohol use No  . Drug use: No  . Sexual activity: Not Currently     Comment: due to poor libido   Other Topics Concern  . None   Social History Narrative   Lives with husband, cNeurosurgeon  1 daughter at [employer] daughter in [place](works in [employer], museum/galleryat [employer], son in [place] 8 grandchildren, 2 great-grandchild     Family History:  The patient's family history includes Diabetes in her brother; Heart disease in her brother, father, and mother; Hypertension in her father and mother.   ROS:   Please see the history of present illness.    Occasional muscle aches and soreness but otherwise no real complaints. Able to anticipate in all activities of daily living. Recent transcontinental trips to bodily without difficulty. Occasional fatigue.  All other systems reviewed and are negative.   PHYSICAL EXAM:   VS:  BP (!) 142/70 (BP Location: Left Arm)   Pulse 87   Ht '5\' 1"'  (1.549 m)   Wt 160 lb 9.6 oz (72.8 kg)   BMI 30.35 kg/m    GEN: Well nourished, well developed, in no acute distress  HEENT: normal  Neck: no JVD, carotid bruits, or masses Cardiac: RRR; no murmurs, rubs, or gallops,no edema  Respiratory:  clear to auscultation bilaterally, normal work of breathing GI: soft, nontender, nondistended, + BS MS: no deformity or atrophy  Skin: warm and dry, no rash Neuro:  Alert and Oriented x 3, Strength and sensation are intact Psych: euthymic mood, full affect  Wt Readings from Last 3 Encounters:  11/10/16 160 lb 9.6 oz (72.8 kg)  06/15/16 160 lb 12.8 oz (72.9 kg)  05/11/16 163 lb 12.8 oz (74.3 kg)      Studies/Labs Reviewed:   EKG:  EKG  The January tracing was personally reviewed. It revealed normal sinus rhythm.  Recent  Labs: 06/09/2016: ALT 16; BUN 30; Creat 1.13; Hemoglobin 13.2; Platelets 190; Potassium 4.0; Sodium 141; TSH 3.41   Lipid Panel    Component Value Date/Time   CHOL 155 06/09/2016 0857   TRIG 174 (H) 06/09/2016 0857   HDL 41 (L) 06/09/2016 0857   CHOLHDL 3.8 06/09/2016 0857   VLDL 35 (H) 06/09/2016 0857   LDLCALC 79 06/09/2016 0857    Additional studies/ records that were reviewed today include:  Echocardiogram 06/26/15:  Study Conclusions  - Left ventricle: The cavity size was normal. Wall thickness was   increased in a  pattern of mild LVH. Systolic function was normal.   The estimated ejection fraction was in the range of 50% to 55%.   Wall motion was normal; there were no regional wall motion   abnormalities. - Aortic valve: Trileaflet; mildly thickened, mildly calcified   leaflets. There was mild regurgitation. - Mitral valve: Calcified annulus. There was mild regurgitation. - Pericardium, extracardiac: A trivial pericardial effusion was   identified posterior to the heart.  ASSESSMENT:    1. Atrial fibrillation with rapid ventricular response ([place])   2. Essential hypertension, benign   3. Pure hypercholesterolemia   4. Chronic anticoagulation      PLAN:  In order of problems listed above:  1. Resolution of atrial fibrillation without apparent recurrence. Thirty-day continuous monitor and if no atrial fibrillation, anticoagulation will be discontinued. 2. Adequate blood pressure control. Was mildly elevated initially on check and blood 128/70 mmHg on recheck. 3. Not addressed. 4. Low-dose Xarelto.  Overall doing well. Raises question of whether anticoagulation will be ongoing. Echo demonstrated a structurally normal heart. Plan 30 day monitor and if no atrial fibrillation we will discontinue Xarelto and resume aspirin 81 mg per day. Clinical follow-up in one year.    Medication Adjustments/Labs and Tests Ordered: Current medicines are reviewed at length with the  patient today.  Concerns regarding medicines are outlined above.  Medication changes, Labs and Tests ordered today are listed in the Patient Instructions below. Patient Instructions  Medication Instructions:  None  Labwork: None  Testing/Procedures: Your physician has recommended that you wear an event monitor. Event monitors are medical devices that record the heart's electrical activity. Doctors most often [place] these monitors to diagnose arrhythmias. Arrhythmias are problems with the speed or rhythm of the heartbeat. The monitor is a small, portable device. You can wear one while you do your normal daily activities. This is usually used to diagnose what is causing palpitations/syncope (passing out).   Follow-Up: Your physician wants you to follow-up in: 1 year with Dr. Tamala Julian.  You will receive a reminder letter in the mail two months in advance. If you don't receive a letter, please call our office to schedule the follow-up appointment.   Any Other Special Instructions Will Be Listed Below (If Applicable).     If you need a refill on your cardiac medications before your next appointment, please call your pharmacy.      Signed, Sinclair Grooms, MD  11/10/2016 11:05 AM    [address], [place], [place]  [address] Phone: [phone]; Fax: [phone]

## 2016-11-10 ENCOUNTER — Encounter: Payer: Self-pay | Admitting: Interventional Cardiology

## 2016-11-10 ENCOUNTER — Ambulatory Visit (INDEPENDENT_AMBULATORY_CARE_PROVIDER_SITE_OTHER): Payer: Medicare HMO | Admitting: Interventional Cardiology

## 2016-11-10 VITALS — BP 142/70 | HR 87 | Ht 61.0 in | Wt 160.6 lb

## 2016-11-10 DIAGNOSIS — Z7901 Long term (current) use of anticoagulants: Secondary | ICD-10-CM

## 2016-11-10 DIAGNOSIS — I10 Essential (primary) hypertension: Secondary | ICD-10-CM

## 2016-11-10 DIAGNOSIS — E7800 Pure hypercholesterolemia, unspecified: Secondary | ICD-10-CM | POA: Diagnosis not present

## 2016-11-10 DIAGNOSIS — I4891 Unspecified atrial fibrillation: Secondary | ICD-10-CM | POA: Diagnosis not present

## 2016-11-10 NOTE — Patient Instructions (Signed)
Medication Instructions:  None  Labwork: None  Testing/Procedures: Your physician has recommended that you wear an event monitor. Event monitors are medical devices that record the heart's electrical activity. Doctors most often [place] these monitors to diagnose arrhythmias. Arrhythmias are problems with the speed or rhythm of the heartbeat. The monitor is a small, portable device. You can wear one while you do your normal daily activities. This is usually used to diagnose what is causing palpitations/syncope (passing out).   Follow-Up: Your physician wants you to follow-up in: 1 year with Dr. Tamala Julian.  You will receive a reminder letter in the mail two months in advance. If you don't receive a letter, please call our office to schedule the follow-up appointment.   Any Other Special Instructions Will Be Listed Below (If Applicable).     If you need a refill on your cardiac medications before your next appointment, please call your pharmacy.

## 2016-11-11 ENCOUNTER — Other Ambulatory Visit: Payer: Self-pay | Admitting: Family Medicine

## 2016-11-11 DIAGNOSIS — Z1231 Encounter for screening mammogram for malignant neoplasm of breast: Secondary | ICD-10-CM

## 2016-11-20 ENCOUNTER — Ambulatory Visit
Admission: RE | Admit: 2016-11-20 | Discharge: 2016-11-20 | Disposition: A | Discharge status: Home / Self Care | Payer: Medicare HMO | Source: Ambulatory Visit | Attending: Family Medicine | Admitting: Family Medicine

## 2016-11-20 DIAGNOSIS — Z1231 Encounter for screening mammogram for malignant neoplasm of breast: Secondary | ICD-10-CM

## 2016-11-20 HISTORY — DX: Personal history of irradiation: Z92.3

## 2016-11-20 HISTORY — DX: Personal history of antineoplastic chemotherapy: Z92.21

## 2016-11-24 ENCOUNTER — Ambulatory Visit (INDEPENDENT_AMBULATORY_CARE_PROVIDER_SITE_OTHER): Payer: Medicare HMO

## 2016-11-24 DIAGNOSIS — I4891 Unspecified atrial fibrillation: Secondary | ICD-10-CM | POA: Diagnosis not present

## 2016-11-25 ENCOUNTER — Telehealth: Payer: Self-pay | Admitting: Interventional Cardiology

## 2016-11-25 NOTE — Telephone Encounter (Signed)
Received EKG tracing from [facility].  Pt noted to be in Afib, rate 120 yesterday at 9:26pm.  Pt was asymptomatic.  States that she was sitting down playing a board game with her husband.  She said just prior to [facility] calling she had gotten very frustrated about something that had occurred and thinks that is what pushed her into Afib.  F/U tracings done at 10pm and 1AM showed pt still in Afib.  Pt states she checked her vitals this AM and they were 142/81, HR 83.  Pt states cuff showed normal rhythm, where last night it showed irregular.  Had pt push button to send in another f/u EKG to see if back in rhythm.  Still awaiting this to come through. Went to Dr. Caryl Comes, DOD and he said ok to send information to Dr. Tamala Julian and to continue current medications. Pt wanted to know if she needed to continue to wear monitor since we put this on her to begin with to try to get her off Xarelto?  Advised to keep on for now and I will send message to Dr. Tamala Julian to see if he wants her to wear it for the entire 30 days?

## 2016-11-25 NOTE — Telephone Encounter (Signed)
Okay to stop monitor. Anticoagulation will need to be continued indefinitely. Help her to understand that she could not tell when she is in atrial fibrillation and the risk of stroke is too high off blood thinner

## 2016-11-25 NOTE — Telephone Encounter (Signed)
Spoke with pt and made her aware of recommendations per Dr. Tamala Julian.  Pt aware of importance for continuing anticoag.  Pt appreciative for call.

## 2016-12-01 ENCOUNTER — Other Ambulatory Visit: Payer: Self-pay | Admitting: Family Medicine

## 2016-12-01 NOTE — Telephone Encounter (Signed)
Pt has an appt in august 

## 2016-12-08 ENCOUNTER — Other Ambulatory Visit: Payer: Medicare HMO

## 2016-12-08 DIAGNOSIS — M109 Gout, unspecified: Secondary | ICD-10-CM | POA: Diagnosis not present

## 2016-12-08 DIAGNOSIS — E7800 Pure hypercholesterolemia, unspecified: Secondary | ICD-10-CM | POA: Diagnosis not present

## 2016-12-08 DIAGNOSIS — I10 Essential (primary) hypertension: Secondary | ICD-10-CM | POA: Diagnosis not present

## 2016-12-08 LAB — COMPREHENSIVE METABOLIC PANEL
ALT: 17 U/L (ref 6–29)
AST: 20 U/L (ref 10–35)
Albumin: 4 g/dL (ref 3.6–5.1)
Alkaline Phosphatase: 71 U/L (ref 33–130)
BUN: 30 mg/dL — ABNORMAL HIGH (ref 7–25)
CO2: 26 mmol/L (ref 20–32)
Calcium: 10.3 mg/dL (ref 8.6–10.4)
Chloride: 106 mmol/L (ref 98–110)
Creat: 1.16 mg/dL — ABNORMAL HIGH (ref 0.60–0.88)
Glucose, Bld: 95 mg/dL (ref 65–99)
Potassium: 4.2 mmol/L (ref 3.5–5.3)
Sodium: 141 mmol/L (ref 135–146)
Total Bilirubin: 0.4 mg/dL (ref 0.2–1.2)
Total Protein: 6.2 g/dL (ref 6.1–8.1)

## 2016-12-08 LAB — LIPID PANEL
Cholesterol: 149 mg/dL (ref <200)
HDL: 41 mg/dL — ABNORMAL LOW (ref >50)
LDL Cholesterol: 67 mg/dL (ref <100)
Total CHOL/HDL Ratio: 3.6 Ratio (ref <5.0)
Triglycerides: 207 mg/dL — ABNORMAL HIGH (ref <150)
VLDL: 41 mg/dL — ABNORMAL HIGH (ref <30)

## 2016-12-09 LAB — URIC ACID: Uric Acid, Serum: 4.6 mg/dL (ref 2.5–7.0)

## 2016-12-13 NOTE — Progress Notes (Signed)
Chief Complaint  Patient presents with  . Hypertension    nonfasting med check, labs already done. Will remain on Xarelto due to a-fib while on cardiac monitor. Also would like to know if you would like to take over her bp meds instead of Dr.Smith?  Night time visits to the bathroom-any nrew meds?   She was complaining of lower abdominal pain and gas at her visit in February. This has improved. Just an occasional sharp, shooting pain into the right lower abdomen, just once every couple of weeks.  Hypertension follow-up: Recent blood pressures have been running 108-149/59-81, averaging about 130/70, pulse in the 80's, (up to 124 when in afib).  Denies dizziness, headaches, chest pain. Denies side effects of medications.   Atrial Fibrillation: She last saw Dr. Tamala Julian in July. She hadn't had any known atrial fibrillation since her pneumonia, so they were doing 30 day monitor--plan was if no atrial fibrillation, they would discontinue Xarelto and resume aspirin 81 mg per day. She reports that on the second day of monitoring she had atrial fibrillation.  She was running to the bathroom, had incontinence. She checked her BP, and even her monitor recognized the irregular rhythm.  This occurred 7/23. Once this was detected, the monitor was removed and sent back.  She also noted irregular rhythm 8/8 on her BP monitor.  She felt a little tired. She was not aware of any palpitations or heart irregularlity (other than what monitor said).  She has been taking Tylenol Arthritis 2 twice daily, but discomfort is tolerable (feet, hands, back); not as well controlled as with Celebrex, but Dr. Tamala Julian agrees she should not use this while on blood thinners.  Gout: She hasn't had any recurrent gout of her great toe, on her current dose of allopurinol (237m).   Hyperlipidemia: She had fasting labs prior to her visit (see below). She hasn't tolerated fish oil in the past (due to belching). She doesn't fry at home,  but does sometimes order fried food (just french fries, no other fried foods, or has fried fish when at the beach). She tries to follow a lowfat, low cholesterol diet. She is compliant with her medications and denies any side effects.  Urge urinary incontinence--Taking Detrol LA 456m  She continues to get up at least 2x/night, usually 3, sometimes up to 4 times. Denies dry mouth, constipation. Denies any dysuria, odor or hematuria.  She wears Poise pads daily.  We previously discussed trying Myrbetriq if not getting improvement in symptoms at the higher Detrol LA dose.  She would like to try and see if another medication is more effective than the Detrol LA.  GERD--She takes Prilosec prn (when taking Celebrex, prior to being on blood thinners, she took this nightly).  She rarely needs this, once every couple of weeks, if eating late or a spicy dinner.  Edema--usually more frequent with warmer temps.Not needing to use the lasix this summer, instead has been keeping her legs elevated.  Osteopenia/low bone mass--She previously took Fosamax (for 10 years, per pt). She stopped it prior to dental work, about 5-6 years ago. She doesn't currently get any weight-bearing exercise. She walks usually 5 days/week, not as often in the last month.  She takes calcium +D--see med list. She had repeat DEXA in 09/2015 which showed mild thinning of bones--no significant change from last check in 2013.   Past Medical History:  Diagnosis Date  . Abnormal brain MRI 8/01   small hemorrhagic stroke and possible cavernous hemangioma (  Dr. Erling Cruz)  . Adenomatous colon polyp 1/03  . Arthritis    OA spine, hands  . Breast cancer ([place]) 2001   R breast (T3N1, ER/PR+, HER-2 +) s/p mastectomy, chemo and chest wall irradiation (Dr. Truddie Coco)  . DDD (degenerative disc disease), lumbar   . GERD (gastroesophageal reflux disease)   . Hearing loss 2012   wears hearing aids  . Herpes zoster 2003  . Hyperlipidemia   .  Hypertension   . Onychomycosis 2003, 2008   treated with Lamisil  . Osteopenia    (DEXA's done by Dr. Truddie Coco); prev took Fosamax.  No change in DEXA after off Fosamax x 2 years  . Ovarian cyst 12/2008   right  . Personal history of chemotherapy   . Personal history of radiation therapy   . Postmenopausal bleeding 2000   benign EMB (Dr. Raphael Gibney)  . Urge urinary incontinence     Past Surgical History:  Procedure Laterality Date  . CATARACT EXTRACTION  summer 2011   bilateral  . CHOLECYSTECTOMY, LAPAROSCOPIC  1/05  . COLONOSCOPY  12/17/08   normal (Dr. Wynetta Emery)  . COLONOSCOPY W/ POLYPECTOMY  05/23/01   adenomatous polyp  . MASTECTOMY Right 2001  . modified mastectomy and tram flap reconstruction  2001   R breast  . TONSILLECTOMY    . tram flap removal     problems with flap after radiation  . TUBAL LIGATION      Social History   Social History  . Marital status: Married    Spouse name: N/A  . Number of children: 3  . Years of education: N/A   Occupational History  . Not on file.   Social History Main Topics  . Smoking status: Never Smoker  . Smokeless tobacco: Never Used  . Alcohol use No  . Drug use: No  . Sexual activity: Not Currently     Comment: due to poor libido   Other Topics Concern  . Not on file   Social History Narrative   Lives with husband, cat.  1 daughter at [employer]; daughter in [place] (works in Conservator, museum/gallery at [employer]), son in [place]. 8 grandchildren, 2 great-grandchild    Family History  Problem Relation Age of Onset  . Heart disease Mother   . Hypertension Mother   . Heart disease Father   . Hypertension Father   . Diabetes Brother   . Heart disease Brother   . Drug abuse Neg Hx   . Cancer Neg Hx   . Breast cancer Neg Hx     Outpatient Encounter Prescriptions as of 12/14/2016  Medication Sig Note  . acetaminophen (TYLENOL) 650 MG CR tablet Take 1,300 mg by mouth 2 (two) times daily.   Marland Kitchen allopurinol (ZYLOPRIM) 100 MG tablet TAKE  TWO TABLETS BY MOUTH DAILY   . Calcium Carbonate-Vitamin D (CALTRATE 600+D) 600-400 MG-UNIT per tablet Take 1 tablet by mouth daily. Takes one in the am everyday and Monday,wednesday and Friday take one at night also   . CARTIA XT 120 MG 24 hr capsule TAKE 1 CAPSULE (120 MG TOTAL) BY MOUTH DAILY.   Marland Kitchen lisinopril (PRINIVIL,ZESTRIL) 40 MG tablet Take 0.5 tablets (20 mg total) by mouth daily.   . Multiple Vitamins-Minerals (CENTRUM SILVER PO) Take 1 tablet by mouth daily.     . pravastatin (PRAVACHOL) 20 MG tablet TAKE ONE TABLET BY MOUTH DAILY   . Probiotic Product (PROBIOTIC-10 PO) Take 2 capsules by mouth daily.   . Rivaroxaban (XARELTO) 15 MG  TABS tablet Take 1 tablet (15 mg total) by mouth daily with supper. Discontinue Eliquis 1m   . tolterodine (DETROL LA) 4 MG 24 hr capsule TAKE ONE CAPSULE BY MOUTH DAILY   . calcium carbonate (TUMS - DOSED IN MG ELEMENTAL CALCIUM) 500 MG chewable tablet Chew 1 tablet by mouth daily as needed for indigestion or heartburn. Reported on 07/10/2015 11/28/2015: Uses prn heartburn, every few months  . colchicine 0.6 MG tablet Use as directed. Take two tablets by mouth at start of gout flare up. Then one hour later take one tablet by mouth.   . furosemide (LASIX) 20 MG tablet Take 0.5-1 tablets (10-20 mg total) by mouth daily as needed for fluid or edema. (Patient not taking: Reported on 12/14/2016) 05/23/2015: Rarely uses; swelling was worse in the heat  . omeprazole (PRILOSEC) 20 MG capsule Take 1 capsule (20 mg total) by mouth daily. Reported on 05/23/2015 (Patient not taking: Reported on 12/14/2016) 06/15/2016: Uses prn only, rarely  . [DISCONTINUED] tolterodine (DETROL LA) 4 MG 24 hr capsule TAKE ONE CAPSULE BY MOUTH DAILY    No facility-administered encounter medications on file as of 12/14/2016.     Allergies  Allergen Reactions  . Cephalexin Itching  . Ciprofloxacin Itching  . Diclofenac Nausea Only    Upset stomach  . Shellfish-Derived Products Nausea And  Vomiting    ROS: no fever, chills, URI symptoms, headaches, dizziness, chest pain, shortness of breath, edema, nausea, vomiting, heartburn, bowel changes, bleeding, bruising, rash.  +arthritis,, manageable with Tylenol Arthritis per HPI. +urinary incontinence as per HPI.  Denies vaginal discharge, bleeding. Denies depression or other complaints. See HPI.   PHYSICAL EXAM:  BP 130/70 (BP Location: Left Arm, Patient Position: Sitting, Cuff Size: Normal)   Pulse 88   Ht '5\' 1"'  (1.549 m)   Wt 161 lb (73 kg)   BMI 30.42 kg/m    Wt Readings from Last 3 Encounters:  11/10/16 160 lb 9.6 oz (72.8 kg)  06/15/16 160 lb 12.8 oz (72.9 kg)  05/11/16 163 lb 12.8 oz (74.3 kg)   Well developed, pleasant female, in good spirits HEENT: PERRL, EOMI, conjunctiva clear.OP is clear Neck: no lymphadenopathy, thyromegaly or carotid bruit Heart: regular rate and rhythm. No heart murmur Lungs: clear bilaterally Back: no CVA or spinal tenderness Abdomen: soft, nontender, no organomegaly or mass Extremities: No edema. 2+ pulses. Bunion deformity. Skin: no bruising, rashes.  Neuro: alert and oriented. Cranial nerves, strength, sensation and gait all normal Psych: normal mood, affect, hygiene and grooming   Lab Results  Component Value Date   CHOL 149 12/08/2016   HDL 41 (L) 12/08/2016   LDLCALC 67 12/08/2016   TRIG 207 (H) 12/08/2016   CHOLHDL 3.6 12/08/2016     Chemistry      Component Value Date/Time   NA 141 12/08/2016 0755   NA 141 11/11/2012 1020   K 4.2 12/08/2016 0755   K 4.1 11/11/2012 1020   CL 106 12/08/2016 0755   CO2 26 12/08/2016 0755   CO2 26 11/11/2012 1020   BUN 30 (H) 12/08/2016 0755   BUN 31.6 (H) 11/11/2012 1020   CREATININE 1.16 (H) 12/08/2016 0755   CREATININE 1.5 (H) 11/11/2012 1020      Component Value Date/Time   CALCIUM 10.3 12/08/2016 0755   CALCIUM 10.1 11/11/2012 1020   ALKPHOS 71 12/08/2016 0755   ALKPHOS 78 11/11/2012 1020   AST 20 12/08/2016 0755    AST 19 11/11/2012 1020   ALT 17 12/08/2016  0755   ALT 17 11/11/2012 1020   BILITOT 0.4 12/08/2016 0755   BILITOT 0.43 11/11/2012 1020     Lab Results  Component Value Date   LABURIC 4.6 12/08/2016    ASSESSMENT/PLAN:  Essential hypertension, benign - controlled  Paroxysmal atrial fibrillation with rapid ventricular response (HCC) - recurrence during monitoring.  Continue Ca blocker, anticoagulation  Long term current use of anticoagulant therapy - no evidence of complications  Gout of big toe - well controlled on allopurinol. reviewed interaction of colchicine with pravastatin (increasing myopathy) if has flare  Pure hypercholesterolemia - LDL at goal, elevated TG. Intolerant of fish oil and on lowfat diet. Trial higher dose pravastatin to see if gets TG to goal - Plan: pravastatin (PRAVACHOL) 40 MG tablet  Urge incontinence of urine - not adequately managed with Detrol, trial of Myrbetriq. 4 weeks samples given 14m, to call of rx (for 2105mvs 5036mepending on efficacy) - Plan: mirabegron ER (MYRBETRIQ) 25 MG TB24 tablet   shingrix recommended--risks/side effects reviewed, to look into getting from pharmacy in January (when more readily available)  Increase pravastatin to 8m78mn attempt to get TG down; intolerant of fish oil) Continue lowfat diet.  Myrbetriq trial  [place]  F/u 2-3 months, fasting Recheck lipids, LFT's at visit, f/u bladder.

## 2016-12-14 ENCOUNTER — Encounter: Payer: Self-pay | Admitting: Family Medicine

## 2016-12-14 ENCOUNTER — Ambulatory Visit (INDEPENDENT_AMBULATORY_CARE_PROVIDER_SITE_OTHER): Payer: Medicare HMO | Admitting: Family Medicine

## 2016-12-14 VITALS — BP 130/70 | HR 88 | Ht 61.0 in | Wt 161.0 lb

## 2016-12-14 DIAGNOSIS — Z7901 Long term (current) use of anticoagulants: Secondary | ICD-10-CM

## 2016-12-14 DIAGNOSIS — N3941 Urge incontinence: Secondary | ICD-10-CM

## 2016-12-14 DIAGNOSIS — E7800 Pure hypercholesterolemia, unspecified: Secondary | ICD-10-CM

## 2016-12-14 DIAGNOSIS — I10 Essential (primary) hypertension: Secondary | ICD-10-CM

## 2016-12-14 DIAGNOSIS — I480 Paroxysmal atrial fibrillation: Secondary | ICD-10-CM | POA: Diagnosis not present

## 2016-12-14 DIAGNOSIS — M109 Gout, unspecified: Secondary | ICD-10-CM

## 2016-12-14 MED ORDER — PRAVASTATIN SODIUM 40 MG PO TABS: 40.0000 mg | ORAL_TABLET | Freq: Every day | ORAL | 0 refills | Status: DC

## 2016-12-14 MED ORDER — MIRABEGRON ER 25 MG PO TB24: 25.0000 mg | ORAL_TABLET | Freq: Every day | ORAL | 0 refills | Status: DC

## 2016-12-14 NOTE — Patient Instructions (Addendum)
Get the high dose flu shot in September/October (it isn't yet available here).  I recommend getting the new shingles vaccine (Shingrix). You will need to check with your insurance to see if it is covered, and if covered by Medicare Part D, you need to get from the pharmacy rather than our office.  It is a series of 2 injections, spaced 2 months apart. I recommend waiting until you see commercials on TV or until 2019 when it is more readily available (to ensure that you can get both doses within 6 months).  Double up on your pravastatin 20mg  that you have left at home.  The new prescription is for 40mg .  We are increasing the dose in attempt to get the triglycerides down some.  Please continue to try and limit your fried foods and sweets.  There is an interaction between pravastatin and colchicine.  If you need to use colchicine for a gout flare, please let us know, and hold the pravastatin temporarily.  Start the Myrbetriq once daily.  You were given 4 weeks of samples.  If this is effective, let us know and we can call in the prescription to your pharmacy.  If it isn't effective enough, we can try the higher dose.  Let us know, and we will follow up on how you're doing at the next visit.  Return in 2-3 months for a fasting visit--to recheck the cholesterol, as well as follow up on your bladder medication.

## 2016-12-29 ENCOUNTER — Ambulatory Visit (INDEPENDENT_AMBULATORY_CARE_PROVIDER_SITE_OTHER): Payer: Medicare HMO | Admitting: Sports Medicine

## 2016-12-29 ENCOUNTER — Encounter: Payer: Self-pay | Admitting: Sports Medicine

## 2016-12-29 DIAGNOSIS — M79671 Pain in right foot: Secondary | ICD-10-CM | POA: Diagnosis not present

## 2016-12-29 DIAGNOSIS — Z7901 Long term (current) use of anticoagulants: Secondary | ICD-10-CM

## 2016-12-29 DIAGNOSIS — M79672 Pain in left foot: Secondary | ICD-10-CM

## 2016-12-29 DIAGNOSIS — B351 Tinea unguium: Secondary | ICD-10-CM

## 2016-12-29 NOTE — Progress Notes (Signed)
Subjective: Connie [name] is a 83 y.o. female patient seen today in office with complaint of painful thickened and elongated toenails; unable to trim. Patient denies changes with medical history; on Xalerto for Afib. Patient has no other pedal complaints at this time.   Patient Active Problem List   Diagnosis Date Noted  . Chronic anticoagulation 05/11/2016  . Atrial fibrillation with rapid ventricular response ([place] [facility]) 06/24/2015  . Gout 10/30/2013  . Gout of big toe 10/30/2013  . Osteoarthritis of multiple joints 10/30/2013  . Breast cancer, right breast ([facility]) 11/25/2012  . Urge incontinence of urine 04/24/2011  . OA (osteoarthritis) 04/24/2011  . Essential hypertension, benign 10/23/2010  . Pure hypercholesterolemia 10/23/2010    Current Outpatient Prescriptions on File Prior to Visit  Medication Sig Dispense Refill  . acetaminophen (TYLENOL) 650 MG CR tablet Take 1,300 mg by mouth 2 (two) times daily.    Marland Kitchen allopurinol (ZYLOPRIM) 100 MG tablet TAKE TWO TABLETS BY MOUTH DAILY 180 tablet 0  . calcium carbonate (TUMS - DOSED IN MG ELEMENTAL CALCIUM) 500 MG chewable tablet Chew 1 tablet by mouth daily as needed for indigestion or heartburn. Reported on 07/10/2015    . Calcium Carbonate-Vitamin D (CALTRATE 600+D) 600-400 MG-UNIT per tablet Take 1 tablet by mouth daily. Takes one in the am everyday and Monday,wednesday and Friday take one at night also    . CARTIA XT 120 MG 24 hr capsule TAKE 1 CAPSULE (120 MG TOTAL) BY MOUTH DAILY. 90 capsule 2  . colchicine 0.6 MG tablet Use as directed. Take two tablets by mouth at start of gout flare up. Then one hour later take one tablet by mouth.    . furosemide (LASIX) 20 MG tablet Take 0.5-1 tablets (10-20 mg total) by mouth daily as needed for fluid or edema. 90 tablet 0  . lisinopril (PRINIVIL,ZESTRIL) 40 MG tablet Take 0.5 tablets (20 mg total) by mouth daily. 90 tablet 1  . mirabegron ER (MYRBETRIQ) 25 MG TB24 tablet Take 1 tablet (25 mg total)  by mouth daily. 28 tablet 0  . Multiple Vitamins-Minerals (CENTRUM SILVER PO) Take 1 tablet by mouth daily.      Marland Kitchen omeprazole (PRILOSEC) 20 MG capsule Take 1 capsule (20 mg total) by mouth daily. Reported on 05/23/2015    . pravastatin (PRAVACHOL) 40 MG tablet Take 1 tablet (40 mg total) by mouth daily. 90 tablet 0  . Probiotic Product (PROBIOTIC-10 PO) Take 2 capsules by mouth daily.    . Rivaroxaban (XARELTO) 15 MG TABS tablet Take 1 tablet (15 mg total) by mouth daily with supper. Discontinue Eliquis 5mg  90 tablet 3  . [DISCONTINUED] solifenacin (VESICARE) 5 MG tablet Take 1 tablet (5 mg total) by mouth daily. 90 tablet 0   No current facility-administered medications on file prior to visit.     Allergies  Allergen Reactions  . Cephalexin Itching  . Ciprofloxacin Itching  . Diclofenac Nausea Only    Upset stomach  . Shellfish-Derived Products Nausea And Vomiting    Objective: Physical Exam  General: Well developed, nourished, no acute distress, awake, alert and oriented x 3  Vascular: Dorsalis pedis artery 1/4 bilateral, Posterior tibial artery 1/4 bilateral, skin temperature warm to warm proximal to distal bilateral lower extremities, no varicosities, pedal hair present bilateral.  Neurological: Gross sensation present via light touch bilateral.   Dermatological: Skin is warm, dry, and supple bilateral, Nails 1-10 are tender, long, thick, and discolored with mild subungal debris, no webspace macerations present bilateral, no  open lesions present bilateral, minimal reactive keratosis medial aspect of right bunion. No signs of infection bilateral.  Musculoskeletal: Asymptomatic bunion boney deformities noted bilateral. Muscular strength within normal limits without painon range of motion. No pain with calf compression bilateral.  Assessment and Plan:  Problem List Items Addressed This Visit    None    Visit Diagnoses    Dermatophytosis of nail    -  Primary   Foot pain,  bilateral       Anticoagulant long-term use         -Examined patient.  -Discussed treatment options for painful mycotic nails. -Mechanically debrided and reduced mycotic nails with sterile nail nipper and dremel nail file without incident. -Recommend good supportive shoes daily for foot type -Patient to return in 2.5 to 3 months for follow up evaluation or sooner if symptoms worsen.  Landis Martins, DPM

## 2017-01-07 ENCOUNTER — Telehealth: Payer: Self-pay | Admitting: Family Medicine

## 2017-01-07 NOTE — Telephone Encounter (Signed)
Pt called and stated that the new medication she was given for her bladder is not work. She states if took care of the urgency but now she has not bladder control. She would like to be palced back on one if the other medications she was on before. Pt uses [facility] [address] and can be reached at [phone].

## 2017-01-07 NOTE — Telephone Encounter (Signed)
She was prescribed 25mg  of myrbetriq (given samples)--did she ever try taking 2 together?  Increasing to the 50mg  dose is an option.  See if we have 50mg  samples (as she likely finished the prior samples of 25mg , or is almost out) and if she would like to try that.  If she doesn't, okay to refill her prior Detrol LA 4mg 

## 2017-01-08 MED ORDER — TOLTERODINE TARTRATE ER 4 MG PO CP24: 4.0000 mg | ORAL_CAPSULE | Freq: Every day | ORAL | 0 refills | Status: DC

## 2017-01-08 NOTE — Telephone Encounter (Signed)
Called and spoke to pt, she did not take 2 tablets together and doesn't want to try 50mg . She wants to go on Detrol LA 4mg . I have sent in med to pharmacy

## 2017-02-01 ENCOUNTER — Encounter: Payer: Self-pay | Admitting: *Deleted

## 2017-02-01 DIAGNOSIS — R69 Illness, unspecified: Secondary | ICD-10-CM | POA: Diagnosis not present

## 2017-03-01 ENCOUNTER — Other Ambulatory Visit: Payer: Self-pay | Admitting: Family Medicine

## 2017-03-16 ENCOUNTER — Ambulatory Visit: Payer: Medicare HMO | Admitting: Sports Medicine

## 2017-03-16 DIAGNOSIS — Z961 Presence of intraocular lens: Secondary | ICD-10-CM | POA: Diagnosis not present

## 2017-03-17 NOTE — Progress Notes (Signed)
Chief Complaint  Patient presents with  . Hyperlipidemia    fasting med check.    Patient presents for 3 month follow-up on her cholesterol and urinary incontinence. She had a wonderful family trip to [place] last month.  Next trip will be a cruise through [place] ([place], [place], [employer]) to [place] for 35 d in June.  Mixed hyperlipidemia:  LDL was at goal, but TG was elevated on last check on 20mg  pravastatin. She didn't tolerate fish oil, so pravastatin dose was increased to 40mg . This change was made at the same time Myrbetriq was added--see below. She is tolerating this without side effects, and due for repeat fasting lipids today. (She did have some abdominal pain when dose was increased at the same time that myrbetriq was added, ?which caused.  She is now back to taking 40mg  and has no problems). She has been back on the 40mg  dose for at least 4 weeks. Lab Results  Component Value Date   CHOL 149 12/08/2016   HDL 41 (L) 12/08/2016   LDLCALC 67 12/08/2016   TRIG 207 (H) 12/08/2016   CHOLHDL 3.6 12/08/2016    Urinary incontinence:  In August, she reported that Detrol didn't adequately manage her symptoms. She was given a trial of myrbetriq. She tried th 25mg , never increased dose to 50mg . She felt it helped some with the urgency, noted within the first week of taking, but not at all for bladder control.  It helped with the urgency right away, but after a week or so she developed lower abdominal pain, and wasn't able to control her urine. Never had dysuria, odor or blood in the urine. She stopped the myrbetriq and decreased the pravastatin to 20mg  (since both med changes started at the same time).  It took a while for the abdominal pain to resolve.  She subsequently went back up to the 40mg  pravastatin and hasn't had further problems with lower abdominal pain.  She has been back on the Detrol.  It seems to be working better for her than it did in the past.  Urgency isn't as bad, and control is a  little better.  She would like to stay on the Detrol.  HTN--sometimes she sees 90/50 and feels fatigued.  It was low for about a week, but it is now back up to her usual range (120's-130).  She doesn't know why the BP was low (no diarrhea, dehydration that she is aware of).   Atrial fibrillation--she was wearing a monitor to see if she had any episodes of afib--the first day she wore it, she got upset when she had urinary accident, and got a call that she was in atrial fib.  So, she happily is staying on xarelto, and didn't finish the month-long monitoring process. She cannot tell when she is in atrial fibrillation, but her blood pressure monitor usually shows "irregular heartbeat".  It didn't show this IHB when her BP was running low.  PMH, PSH, SH reviewed  Outpatient Encounter Medications as of 03/18/2017  Medication Sig Note  . acetaminophen (TYLENOL) 650 MG CR tablet Take 1,300 mg by mouth 2 (two) times daily.   Marland Kitchen allopurinol (ZYLOPRIM) 100 MG tablet TAKE TWO TABLETS BY MOUTH DAILY   . Calcium Carbonate-Vitamin D (CALTRATE 600+D) 600-400 MG-UNIT per tablet Take 1 tablet by mouth daily. Takes one in the am everyday and Monday,wednesday and Friday take one at night also   . CARTIA XT 120 MG 24 hr capsule TAKE 1 CAPSULE (120 MG  TOTAL) BY MOUTH DAILY.   Marland Kitchen lisinopril (PRINIVIL,ZESTRIL) 40 MG tablet Take 0.5 tablets (20 mg total) by mouth daily.   . Multiple Vitamins-Minerals (CENTRUM SILVER PO) Take 1 tablet by mouth daily.     . pravastatin (PRAVACHOL) 40 MG tablet Take 1 tablet (40 mg total) by mouth daily.   . Rivaroxaban (XARELTO) 15 MG TABS tablet Take 1 tablet (15 mg total) by mouth daily with supper. Discontinue Eliquis 5mg    . tolterodine (DETROL LA) 4 MG 24 hr capsule Take 1 capsule (4 mg total) by mouth daily.   . calcium carbonate (TUMS - DOSED IN MG ELEMENTAL CALCIUM) 500 MG chewable tablet Chew 1 tablet by mouth daily as needed for indigestion or heartburn. Reported on 07/10/2015  11/28/2015: Uses prn heartburn, every few months  . colchicine 0.6 MG tablet Use as directed. Take two tablets by mouth at start of gout flare up. Then one hour later take one tablet by mouth.   . furosemide (LASIX) 20 MG tablet Take 0.5-1 tablets (10-20 mg total) by mouth daily as needed for fluid or edema. (Patient not taking: Reported on 03/18/2017) 05/23/2015: Rarely uses; swelling was worse in the heat  . omeprazole (PRILOSEC) 20 MG capsule Take 1 capsule (20 mg total) by mouth daily. Reported on 05/23/2015 (Patient not taking: Reported on 03/18/2017) 06/15/2016: Uses prn only, rarely  . [DISCONTINUED] Probiotic Product (PROBIOTIC-10 PO) Take 2 capsules by mouth daily.    No facility-administered encounter medications on file as of 03/18/2017.    No longer taking probiotics (didn't help). Rarely needs Tums or prilosec, much less often.  Allergies  Allergen Reactions  . Cephalexin Itching  . Ciprofloxacin Itching  . Diclofenac Nausea Only    Upset stomach  . Shellfish-Derived Products Nausea And Vomiting   ROS: no fever, chills, headaches, dizziness, chest pain, shortness of breath, IG or GU complaints except as noted in HPI.  Fatigue only when BP was low, resolved.  No bleeding, bruising, rash. Mood are good.   PHYSICAL EXAM:  BP 134/64   Pulse 76   Ht 5\' 1"  (1.549 m)   Wt 156 lb (70.8 kg)   BMI 29.48 kg/m   Wt Readings from Last 3 Encounters:  03/18/17 156 lb (70.8 kg)  12/14/16 161 lb (73 kg)  11/10/16 160 lb 9.6 oz (72.8 kg)   Well appearing, pleasant elderly female in no distress. Appears somewhat younger than stated age 83: conjunctiva and sclera are clear, EOMI. OP is clear Neck: no lymphadenopathy or mass Heart: regular rate and rhythm, no murmur Lungs: clear bilaterally Back: no CVA tenderness Abdomen: soft, nontender, no mass Extremities: no edema Skin: normal tugor, no rash Psych: normal mood, affect, hygiene and grooming Neuro: alert and oriented, cranial  nerves intact, normal gait.   ASSESSMENT/PLAN:  Mixed hyperlipidemia - due fo rrecheck since pravastatin dose was increased to 40mg  (short time when she was taking only 20mg , back on 40mg  x 4 wks) - Plan: Lipid panel, Hepatic function panel  Urge incontinence of urine - back on Detrol, and doing better.  Discussed that we could re-try Myrbetriq in future, if needed (and try the higher dose), not needed now  Paroxysmal atrial fibrillation with rapid ventricular response ([place]) - proven that she is still going into afib sporadically, showing the need for ongoing anticoagulation.  no complications  Essential hypertension, benign - well controlled; low blood pressures short-lived, resolved  Pure hypercholesterolemia - Plan: pravastatin (PRAVACHOL) 40 MG tablet   Lipids, LFT  F/u as scheduled in Feb for physical

## 2017-03-18 ENCOUNTER — Ambulatory Visit: Payer: Medicare HMO | Admitting: Family Medicine

## 2017-03-18 ENCOUNTER — Encounter: Payer: Self-pay | Admitting: Family Medicine

## 2017-03-18 VITALS — BP 134/64 | HR 76 | Ht 61.0 in | Wt 156.0 lb

## 2017-03-18 DIAGNOSIS — I10 Essential (primary) hypertension: Secondary | ICD-10-CM | POA: Diagnosis not present

## 2017-03-18 DIAGNOSIS — I480 Paroxysmal atrial fibrillation: Secondary | ICD-10-CM

## 2017-03-18 DIAGNOSIS — E7800 Pure hypercholesterolemia, unspecified: Secondary | ICD-10-CM

## 2017-03-18 DIAGNOSIS — N3941 Urge incontinence: Secondary | ICD-10-CM

## 2017-03-18 DIAGNOSIS — E782 Mixed hyperlipidemia: Secondary | ICD-10-CM

## 2017-03-18 LAB — LIPID PANEL
Cholesterol: 160 mg/dL (ref <200)
HDL: 45 mg/dL — ABNORMAL LOW (ref >50)
LDL Cholesterol (Calc): 84 mg/dL (calc)
Non-HDL Cholesterol (Calc): 115 mg/dL (calc) (ref <130)
Total CHOL/HDL Ratio: 3.6 (calc) (ref <5.0)
Triglycerides: 216 mg/dL — ABNORMAL HIGH (ref <150)

## 2017-03-18 LAB — HEPATIC FUNCTION PANEL
AG Ratio: 1.8 (calc) (ref 1.0–2.5)
ALT: 15 U/L (ref 6–29)
AST: 21 U/L (ref 10–35)
Albumin: 4.2 g/dL (ref 3.6–5.1)
Alkaline phosphatase (APISO): 74 U/L (ref 33–130)
Bilirubin, Direct: 0.1 mg/dL (ref 0.0–0.2)
Globulin: 2.4 g/dL (calc) (ref 1.9–3.7)
Indirect Bilirubin: 0.4 mg/dL (calc) (ref 0.2–1.2)
Total Bilirubin: 0.5 mg/dL (ref 0.2–1.2)
Total Protein: 6.6 g/dL (ref 6.1–8.1)

## 2017-03-18 LAB — EXTRA LAV TOP TUBE

## 2017-03-18 MED ORDER — PRAVASTATIN SODIUM 40 MG PO TABS: 40.0000 mg | ORAL_TABLET | Freq: Every day | ORAL | 1 refills | Status: DC

## 2017-03-30 ENCOUNTER — Ambulatory Visit (INDEPENDENT_AMBULATORY_CARE_PROVIDER_SITE_OTHER): Payer: Medicare HMO

## 2017-03-30 ENCOUNTER — Encounter: Payer: Self-pay | Admitting: Sports Medicine

## 2017-03-30 ENCOUNTER — Other Ambulatory Visit: Payer: Self-pay | Admitting: Sports Medicine

## 2017-03-30 ENCOUNTER — Ambulatory Visit: Payer: Medicare HMO | Admitting: Sports Medicine

## 2017-03-30 DIAGNOSIS — M7671 Peroneal tendinitis, right leg: Secondary | ICD-10-CM

## 2017-03-30 DIAGNOSIS — B351 Tinea unguium: Secondary | ICD-10-CM | POA: Diagnosis not present

## 2017-03-30 DIAGNOSIS — M779 Enthesopathy, unspecified: Secondary | ICD-10-CM

## 2017-03-30 DIAGNOSIS — M79671 Pain in right foot: Secondary | ICD-10-CM | POA: Diagnosis not present

## 2017-03-30 DIAGNOSIS — R69 Illness, unspecified: Secondary | ICD-10-CM | POA: Diagnosis not present

## 2017-03-30 DIAGNOSIS — Z7901 Long term (current) use of anticoagulants: Secondary | ICD-10-CM

## 2017-03-30 NOTE — Progress Notes (Signed)
Subjective: Connie [name] is a 83 y.o. female patient seen today in office with complaint of painful thickened and elongated toenails; unable to trim. Patient denies changes with medical history; on Xalerto for Afib. Patient states for the last 5 weeks has noticed some pain with 1st steps out of bed or at end of day at the side of her right foot. States that the pain is off and on about 4/10 but not consistent. Tylenol helps. Denies known injury or changing shoes or activity. Patient has no other pedal complaints at this time.   Patient Active Problem List   Diagnosis Date Noted  . Chronic anticoagulation 05/11/2016  . Atrial fibrillation with rapid ventricular response ([place]) 06/24/2015  . Gout 10/30/2013  . Gout of big toe 10/30/2013  . Osteoarthritis of multiple joints 10/30/2013  . Breast cancer, right breast ([place]) 11/25/2012  . Urge incontinence of urine 04/24/2011  . OA (osteoarthritis) 04/24/2011  . Essential hypertension, benign 10/23/2010  . Pure hypercholesterolemia 10/23/2010    Current Outpatient Medications on File Prior to Visit  Medication Sig Dispense Refill  . acetaminophen (TYLENOL) 650 MG CR tablet Take 1,300 mg by mouth 2 (two) times daily.    Marland Kitchen allopurinol (ZYLOPRIM) 100 MG tablet TAKE TWO TABLETS BY MOUTH DAILY 180 tablet 0  . calcium carbonate (TUMS - DOSED IN MG ELEMENTAL CALCIUM) 500 MG chewable tablet Chew 1 tablet by mouth daily as needed for indigestion or heartburn. Reported on 07/10/2015    . Calcium Carbonate-Vitamin D (CALTRATE 600+D) 600-400 MG-UNIT per tablet Take 1 tablet by mouth daily. Takes one in the am everyday and Monday,wednesday and Friday take one at night also    . CARTIA XT 120 MG 24 hr capsule TAKE 1 CAPSULE (120 MG TOTAL) BY MOUTH DAILY. 90 capsule 2  . colchicine 0.6 MG tablet Use as directed. Take two tablets by mouth at start of gout flare up. Then one hour later take one tablet by mouth.    . furosemide (LASIX) 20 MG tablet Take 0.5-1  tablets (10-20 mg total) by mouth daily as needed for fluid or edema. (Patient not taking: Reported on 03/18/2017) 90 tablet 0  . lisinopril (PRINIVIL,ZESTRIL) 40 MG tablet Take 0.5 tablets (20 mg total) by mouth daily. 90 tablet 1  . Multiple Vitamins-Minerals (CENTRUM SILVER PO) Take 1 tablet by mouth daily.      Marland Kitchen omeprazole (PRILOSEC) 20 MG capsule Take 1 capsule (20 mg total) by mouth daily. Reported on 05/23/2015 (Patient not taking: Reported on 03/18/2017)    . pravastatin (PRAVACHOL) 40 MG tablet Take 1 tablet (40 mg total) daily by mouth. 90 tablet 1  . Rivaroxaban (XARELTO) 15 MG TABS tablet Take 1 tablet (15 mg total) by mouth daily with supper. Discontinue Eliquis 16m 90 tablet 3  . tolterodine (DETROL LA) 4 MG 24 hr capsule Take 1 capsule (4 mg total) by mouth daily. 90 capsule 0  . [DISCONTINUED] solifenacin (VESICARE) 5 MG tablet Take 1 tablet (5 mg total) by mouth daily. 90 tablet 0   No current facility-administered medications on file prior to visit.     Allergies  Allergen Reactions  . Cephalexin Itching  . Ciprofloxacin Itching  . Diclofenac Nausea Only    Upset stomach  . Shellfish-Derived Products Nausea And Vomiting    Objective: Physical Exam  General: Well developed, nourished, no acute distress, awake, alert and oriented x 3  Vascular: Dorsalis pedis artery 1/4 bilateral, Posterior tibial artery 1/4 bilateral, skin temperature warm  to warm proximal to distal bilateral lower extremities, no varicosities, pedal hair present bilateral.  Neurological: Gross sensation present via light touch bilateral.   Dermatological: Skin is warm, dry, and supple bilateral, Nails 1-10 are tender, long, thick, and discolored with mild subungal debris, no webspace macerations present bilateral, no open lesions present bilateral, minimal reactive keratosis medial aspect of right bunion. No signs of infection bilateral.  Musculoskeletal: There is mild tenderness at Peroneal  insertion at 5th met base on right. Asymptomatic bunion boney deformities noted bilateral. Muscular strength within normal limits without painon range of motion. No pain with calf compression bilateral.  Xray right foot-  No acute findings, bunion deformity, midtarsal joint arthritis, mild soft tissue swelling 5th met base.   Assessment and Plan:  Problem List Items Addressed This Visit    None    Visit Diagnoses    Peroneal tendonitis, right    -  Primary   Foot pain, right       Dermatophytosis of nail       Anticoagulant long-term use         -Examined patient.  -Discussed treatment options for painful mycotic nails and tendonitis. -Xrays reviewed  -Patient refused injection or any treatment for tendonitis at this time; recommend topical pain rubs, rest, ice, and good supportive shoes meanwhile.  -Mechanically debrided and reduced mycotic nails with sterile nail nipper and dremel nail file without incident. -Patient to return in 2.5  months for follow up evaluation or sooner if symptoms worsen.  Landis Martins, DPM

## 2017-04-14 ENCOUNTER — Other Ambulatory Visit (HOSPITAL_COMMUNITY): Payer: Self-pay | Admitting: Interventional Cardiology

## 2017-04-26 ENCOUNTER — Other Ambulatory Visit: Payer: Self-pay | Admitting: Family Medicine

## 2017-06-07 ENCOUNTER — Other Ambulatory Visit: Payer: Self-pay | Admitting: Family Medicine

## 2017-06-08 ENCOUNTER — Encounter: Payer: Self-pay | Admitting: Sports Medicine

## 2017-06-08 ENCOUNTER — Ambulatory Visit: Payer: Medicare HMO | Admitting: Sports Medicine

## 2017-06-08 DIAGNOSIS — B351 Tinea unguium: Secondary | ICD-10-CM

## 2017-06-08 DIAGNOSIS — Z7901 Long term (current) use of anticoagulants: Secondary | ICD-10-CM | POA: Diagnosis not present

## 2017-06-08 DIAGNOSIS — M79675 Pain in left toe(s): Secondary | ICD-10-CM

## 2017-06-08 DIAGNOSIS — M79674 Pain in right toe(s): Secondary | ICD-10-CM | POA: Diagnosis not present

## 2017-06-08 NOTE — Progress Notes (Signed)
Subjective: Connie [name] is a 84 y.o. female patient seen today in office with complaint of painful thickened and elongated toenails; unable to trim. Patient denies changes with medical history; still on Xalerto for Afib. Patient states that pain on right foot that was there last visit is better. Patient has no other pedal complaints at this time.   Patient Active Problem List   Diagnosis Date Noted  . Chronic anticoagulation 05/11/2016  . Atrial fibrillation with rapid ventricular response ([place]) 06/24/2015  . Gout 10/30/2013  . Gout of big toe 10/30/2013  . Osteoarthritis of multiple joints 10/30/2013  . Breast cancer, right breast ([place]) 11/25/2012  . Urge incontinence of urine 04/24/2011  . OA (osteoarthritis) 04/24/2011  . Essential hypertension, benign 10/23/2010  . Pure hypercholesterolemia 10/23/2010    Current Outpatient Medications on File Prior to Visit  Medication Sig Dispense Refill  . acetaminophen (TYLENOL) 650 MG CR tablet Take 1,300 mg by mouth 2 (two) times daily.    Marland Kitchen allopurinol (ZYLOPRIM) 100 MG tablet TAKE TWO TABLETS BY MOUTH DAILY 180 tablet 0  . calcium carbonate (TUMS - DOSED IN MG ELEMENTAL CALCIUM) 500 MG chewable tablet Chew 1 tablet by mouth daily as needed for indigestion or heartburn. Reported on 07/10/2015    . Calcium Carbonate-Vitamin D (CALTRATE 600+D) 600-400 MG-UNIT per tablet Take 1 tablet by mouth daily. Takes one in the am everyday and Monday,wednesday and Friday take one at night also    . CARTIA XT 120 MG 24 hr capsule TAKE ONE CAPSULE BY MOUTH DAILY 90 capsule 2  . colchicine 0.6 MG tablet Use as directed. Take two tablets by mouth at start of gout flare up. Then one hour later take one tablet by mouth.    . furosemide (LASIX) 20 MG tablet Take 0.5-1 tablets (10-20 mg total) by mouth daily as needed for fluid or edema. 90 tablet 0  . lisinopril (PRINIVIL,ZESTRIL) 40 MG tablet Take 0.5 tablets (20 mg total) by mouth daily. 90 tablet 1  .  Multiple Vitamins-Minerals (CENTRUM SILVER PO) Take 1 tablet by mouth daily.      Marland Kitchen omeprazole (PRILOSEC) 20 MG capsule Take 1 capsule (20 mg total) by mouth daily. Reported on 05/23/2015    . pravastatin (PRAVACHOL) 40 MG tablet Take 1 tablet (40 mg total) daily by mouth. 90 tablet 1  . Rivaroxaban (XARELTO) 15 MG TABS tablet Take 1 tablet (15 mg total) by mouth daily with supper. Discontinue Eliquis 73m 90 tablet 3  . tolterodine (DETROL LA) 4 MG 24 hr capsule TAKE ONE CAPSULE BY MOUTH DAILY 90 capsule 0  . [DISCONTINUED] solifenacin (VESICARE) 5 MG tablet Take 1 tablet (5 mg total) by mouth daily. 90 tablet 0   No current facility-administered medications on file prior to visit.     Allergies  Allergen Reactions  . Cephalexin Itching  . Ciprofloxacin Itching  . Diclofenac Nausea Only    Upset stomach  . Shellfish-Derived Products Nausea And Vomiting    Objective: Physical Exam  General: Well developed, nourished, no acute distress, awake, alert and oriented x 3  Vascular: Dorsalis pedis artery 1/4 bilateral, Posterior tibial artery 1/4 bilateral, skin temperature warm to warm proximal to distal bilateral lower extremities, no varicosities, pedal hair present bilateral.  Neurological: Gross sensation present via light touch bilateral.   Dermatological: Skin is warm, dry, and supple bilateral, Nails 1-10 are tender, long, thick, and discolored with mild subungal debris, no webspace macerations present bilateral, no open lesions present bilateral,  minimal reactive keratosis medial aspect of right bunion. No signs of infection bilateral.  Musculoskeletal: There is no residual tenderness at Peroneal insertion at 5th met base on right. Asymptomatic bunion boney deformities noted bilateral. Muscular strength within normal limits without painon range of motion. No pain with calf compression bilateral.   Assessment and Plan:  Problem List Items Addressed This Visit    None    Visit  Diagnoses    Dermatophytosis of nail    -  Primary   Anticoagulant long-term use       Toe pain, bilateral         -Examined patient.  -Discussed treatment options for painful mycotic nails  -Mechanically debrided and reduced mycotic nails with sterile nail nipper and dremel nail file without incident. -Patient to return in 2.5  months for follow up evaluation or sooner if symptoms worsen.  Landis Martins, DPM

## 2017-06-14 ENCOUNTER — Other Ambulatory Visit: Payer: Self-pay | Admitting: Interventional Cardiology

## 2017-06-14 NOTE — Telephone Encounter (Signed)
Xarelto 15mg  refill request received; pt is 84 yrs old, wt- 70.8kg, Crea-1.16 on 12/08/16, last seen by Dr. Tamala Julian on 11/10/16, CrCl-38.24ml/min; will send in refill to requested pharmacy.

## 2017-06-27 NOTE — Progress Notes (Signed)
Chief Complaint  Patient presents with  . Medicare Wellness    nonfasting annual wellness with pelvic. Energy level is down, thinks due to not walking.     Connie [name] is a 84 y.o. female who presents for annual wellness visit and follow-up on chronic medical conditions.  She has the following concerns:  She has some decreased energy.  Hypertension follow-up: Hasn't been checking her blood pressures recently.  She did check it a few days ago and recalls that it was 825 systolic.  Denies missed pills.  She admits to having chinese food twice recently.  Denies dizziness, headaches, chest pain, edema. Denies side effects of medications.   Atrial Fibrillation: She last saw Dr. Tamala Julian in July. She is not aware of palpitations or tachycardia (only knows when in afib if says irregular heartbeat on her monitor). She continues on Xarelto without complications. She has been taking Tylenol Arthritis 2 twice daily, but discomfort is tolerable (feet, hands, back); not as well controlled as with Celebrex, but Dr. Tamala Julian agrees she should not use this while on blood thinners.  Gout: She hasn't had any recurrent gout of her great toe, on her current dose of allopurinol (258m).   Hyperlipidemia: Lipids weren't at goal on 272mof pravastatin.  Dose was increased to 4059m Lipids were not at goal on recheck in November (but had cut the dose for a bit).  She hasn't tolerated fish oil in the past (due to belching). She doesn't fry at home, but does sometimes order fried food (just french fries, no other fried foods, or has fried fish when at the beach). She tries to follow a lowfat, low cholesterol diet. She is compliant with her medications and denies any side effects. Due for recheck. She is not fasting today.  Urge urinary incontinence--Taking Detrol LA 4mg43mhe continues to get up at least 2-3x/night (which is better than it was). Denies dry mouth, constipation. Denies any dysuria, odor or  hematuria. She wears Poise pads daily.  She tried low dose of Myrbetriq (at the same time she increased pravastatin, had some abdominal pain), preferred to go back to [employer]rBig Lots mentioned considering the higher dose (lower dose wasn't effective), if Detrol not working as well. She is pleased with how the Detrol is currently working.  GERD--She takes Prilosec prn (when taking Celebrex, prior to being on blood thinners, she took this nightly). She rarely needs this (once or twice a month or less), once every couple of weeks, if eating late or a spicy dinner.  Edema--usually more frequent with warmer temps.Not needing to use Lasix.  Osteopenia/low bone mass--She previously took Fosamax (for 10 years, per pt). She stopped it prior to dental work, about 5-6 years ago. She doesn't currently get any weight-bearing exercise. She takes calcium +D--see med list. She had repeat DEXA in 09/2015 which showed mild thinning of bones--no significant change from last check in 2013.    Immunization History  Administered Date(s) Administered  . Hepatitis A 11/06/1995, 08/25/1996  . IPV 11/06/1995  . Influenza Split 02/02/2011, 02/02/2012, 01/19/2013  . Influenza, High Dose Seasonal PF 02/24/2016, 02/01/2017  . Influenza-Unspecified 01/16/2014, 02/02/2015  . Meningococcal Polysaccharide 11/06/1995  . Pneumococcal Conjugate-13 05/01/2013  . Pneumococcal Polysaccharide-23 11/06/1995, 06/04/2005, 06/15/2016  . Tdap 11/06/1995, 06/04/2005, 06/16/2013, 12/06/2014  . Typhoid Live 11/15/1995, 04/29/2010, 06/10/2015  . Yellow Fever 04/04/2012  . Zoster 06/04/2005   Last Pap smear: 6/09  Last mammogram: 11/2016 Last colonoscopy: 12/2008  Last DEXA: 09/2015 Dentist: regular,  1-2 times/year (every 9 months) Ophtho: yearly  Exercise:None in the last 2 months (weather, and having a cold); previously routine was walking 1.5 miles (40 minutes) 3-4 times a week. No weight-bearing exercise.   Other doctors  caring for patient include: Ophtho: Dr. [place] Dentist: Dr. Altamese Milton (due soon, new dentist, hasn't seen yet) Oncologist: Dr. Humphrey Rolls (she has been released from her care) Derm: Dr. Derrel Nip GI: Dr. Wynetta Emery Podiatrist: Dr. Cannon Kettle Cardiologists: Dr. Tamala Julian, Dr. Rayann Heman   Depression screen: Negative Fall screen: negative Functional status screen: some trouble with her hearing aids (not working as well), trouble with remembering names, chronic/unchanged, some knee trouble starting--see full questionnaire in epic.   End of Life Discussion: Patient hasa living will and medical power of attorney  Past Medical History:  Diagnosis Date  . Abnormal brain MRI 8/01   small hemorrhagic stroke and possible cavernous hemangioma (Dr. Erling Cruz)  . Adenomatous colon polyp 1/03  . Arthritis    OA spine, hands  . Breast cancer ([place]) 2001   R breast (T3N1, ER/PR+, HER-2 +) s/p mastectomy, chemo and chest wall irradiation (Dr. Truddie Coco)  . DDD (degenerative disc disease), lumbar   . GERD (gastroesophageal reflux disease)   . Hearing loss 2012   wears hearing aids  . Herpes zoster 2003  . Hyperlipidemia   . Hypertension   . Onychomycosis 2003, 2008   treated with Lamisil  . Osteopenia    (DEXA's done by Dr. Truddie Coco); prev took Fosamax.  No change in DEXA after off Fosamax x 2 years  . Ovarian cyst 12/2008   right  . Personal history of chemotherapy   . Personal history of radiation therapy   . Postmenopausal bleeding 2000   benign EMB (Dr. Raphael Gibney)  . Urge urinary incontinence     Past Surgical History:  Procedure Laterality Date  . CATARACT EXTRACTION  summer 2011   bilateral  . CHOLECYSTECTOMY, LAPAROSCOPIC  1/05  . COLONOSCOPY  12/17/08   normal (Dr. Wynetta Emery)  . COLONOSCOPY W/ POLYPECTOMY  05/23/01   adenomatous polyp  . MASTECTOMY Right 2001  . modified mastectomy and tram flap reconstruction  2001   R breast  . TONSILLECTOMY    . tram flap removal     problems with flap after radiation   . TUBAL LIGATION      Social History   Socioeconomic History  . Marital status: Married    Spouse name: Not on file  . Number of children: 3  . Years of education: Not on file  . Highest education level: Not on file  Social Needs  . Financial resource strain: Not on file  . Food insecurity - worry: Not on file  . Food insecurity - inability: Not on file  . Transportation needs - medical: Not on file  . Transportation needs - non-medical: Not on file  Occupational History  . Not on file  Tobacco Use  . Smoking status: Never Smoker  . Smokeless tobacco: Never Used  Substance and Sexual Activity  . Alcohol use: No  . Drug use: No  . Sexual activity: Not Currently    Comment: due to poor libido  Other Topics Concern  . Not on file  Social History Narrative   Lives with husband, cat.  1 daughter at [employer]; daughter in [place] (works in Conservator, museum/gallery at [employer]), son in [place]. 8 grandchildren, 2 great-grandchildren, expecting a 14rd in May    Family History  Problem Relation Age of Onset  . Heart disease  Mother   . Hypertension Mother   . Heart disease Father   . Hypertension Father   . Diabetes Brother   . Heart disease Brother   . Drug abuse Neg Hx   . Cancer Neg Hx   . Breast cancer Neg Hx     Outpatient Encounter Medications as of 06/28/2017  Medication Sig Note  . acetaminophen (TYLENOL) 650 MG CR tablet Take 1,300 mg by mouth 2 (two) times daily.   Marland Kitchen allopurinol (ZYLOPRIM) 100 MG tablet TAKE TWO TABLETS BY MOUTH DAILY   . calcium carbonate (TUMS - DOSED IN MG ELEMENTAL CALCIUM) 500 MG chewable tablet Chew 1 tablet by mouth daily as needed for indigestion or heartburn. Reported on 07/10/2015 11/28/2015: Uses prn heartburn, every few months  . Calcium Carbonate-Vitamin D (CALTRATE 600+D) 600-400 MG-UNIT per tablet Take 1 tablet by mouth daily. Takes one in the am everyday and Monday,wednesday and Friday take one at night also   . CARTIA XT 120 MG 24 hr capsule TAKE  ONE CAPSULE BY MOUTH DAILY   . lisinopril (PRINIVIL,ZESTRIL) 40 MG tablet Take 0.5 tablets (20 mg total) by mouth daily.   . Multiple Vitamins-Minerals (CENTRUM SILVER PO) Take 1 tablet by mouth daily.     . pravastatin (PRAVACHOL) 40 MG tablet Take 1 tablet (40 mg total) daily by mouth. 06/28/2017: Taking 1/2 tablet daily  . tolterodine (DETROL LA) 4 MG 24 hr capsule TAKE ONE CAPSULE BY MOUTH DAILY   . XARELTO 15 MG TABS tablet TAKE ONE TABLET BY MOUTH DAILY WITH SUPPER. DISCONTINUE ELIQUIS 5MG   . colchicine 0.6 MG tablet Use as directed. Take two tablets by mouth at start of gout flare up. Then one hour later take one tablet by mouth.   . furosemide (LASIX) 20 MG tablet Take 0.5-1 tablets (10-20 mg total) by mouth daily as needed for fluid or edema. (Patient not taking: Reported on 06/28/2017) 05/23/2015: Rarely uses; swelling was worse in the heat  . omeprazole (PRILOSEC) 20 MG capsule Take 1 capsule (20 mg total) by mouth daily. Reported on 05/23/2015 (Patient not taking: Reported on 06/28/2017) 06/15/2016: Uses prn only, rarely  . [DISCONTINUED] solifenacin (VESICARE) 5 MG tablet Take 1 tablet (5 mg total) by mouth daily.    No facility-administered encounter medications on file as of 06/28/2017.     Allergies  Allergen Reactions  . Cephalexin Itching  . Ciprofloxacin Itching  . Diclofenac Nausea Only    Upset stomach  . Shellfish-Derived Products Nausea And Vomiting    ROS: The patient denies anorexia, fever, weight changes, headaches, vision changes, ear pain, sore throat, breast concerns, chest pain, dizziness, syncope, dyspnea on exertion, cough, swelling, nausea, vomiting, diarrhea, constipation, melena, hematochezia, indigestion/heartburn, hematuria, dysuria, vaginal bleeding, discharge, odor or itch, genital lesions, weakness, tremor, suspicious skin lesions, depression, anxiety, abnormal bleeding/bruising, or enlarged lymph nodes.  ongoing numbness in fingers and toes since her chemo  (most in the right hand, just at the tips, mild); no weakness. +urge urinary incontinence (see HPI). +joint pains in hands/fingers/feet--treated with tylenol with adequate pain relief; recently started having some knee pain with stairs, mild +hearing loss--has hearing aids Denies any thyroid symptoms other than slight fatigue since not exercising Right lateral foot pain, tendonitis, didn't want injection, saw podiatrist  Recent URI, slight residual postnasal drainage. Mucus is thick, not discolored.   PHYSICAL EXAM:  BP (!) 158/74   Pulse 84   Ht 4' 11.75" (1.518 m)   Wt 156 lb 9.6 oz (71  kg)   BMI 30.84 kg/m   154/60 on repeat by MD  Wt Readings from Last 3 Encounters:  06/28/17 156 lb 9.6 oz (71 kg)  03/18/17 156 lb (70.8 kg)  12/14/16 161 lb (73 kg)    General Appearance:  Alert, cooperative, no distress, appears stated age   Head:  Normocephalic, without obvious abnormality, atraumatic   Eyes:  PERRL, conjunctiva/corneas clear, EOM's intact, fundi benign   Ears:  Wearing hearing aids. TM's and EAC's were normal  Nose:  Nares normal, mucosa mildly edematous with white mucus noted bilaterally; no erythema or sinus tenderness   Throat:  Lips, mucosa, and tongue normal; teeth and gums normal.   Neck:  Supple, no lymphadenopathy; thyroid: no enlargement/ tenderness/nodules; no carotid bruit or JVD   Back:  Spine nontender, no curvature, ROM normal, no CVA tenderness   Lungs:  Clear to auscultation bilaterally without wheezes, rales or ronchi; respirations unlabored   Chest Wall:  No tenderness or deformity   Heart:  Regular rate and rhythm, S1 and S2 normal, no murmur, rub or gallop.Slight pause after every other beat, regular.  Breast Exam:  R breast absent, well healed surgical scars, nontender.Egg-sized soft tissue, cystic-swelling at proximal part of right pectoralis tendon--she feels is related to how she wears the compression  sleeve on that arm, varies in size.  Nontender. L breast normal-- no masses, or nipple discharge. nipple is mildly inverted, chronic. No axillary lymphadenopathy   Abdomen:  Soft, non-tender, nondistended, normoactive bowel sounds, no masses, no hepatosplenomegaly.   Genitalia:  Normal external genitalia without lesions. BUS and vagina normal,  + atrophic changes; no cervical motion tenderness. No abnormal vaginal discharge. Uterus normal size, nontender, no masses. No adnexal masses or tenderness; exam is somewhat limited by body habitus. Pap not performed   Rectal:  Normal tone, no masses or tenderness; guaiac negative stool   Extremities:  No clubbing or cyanosis. Bunion deformities noted bilaterally, R>L, slight hammertoe deformity of the R 2nd toe. Tender at right lateral 5th metatarsal no overlying erythema, soft tissue swelling or warmth. No edema.  Pulses:  2+ and symmetric all extremities   Skin:  Skin color, texture, turgor normal, no rashes or lesions. Many angiomas on abdomen.   Lymph nodes:  Cervical, supraclavicular, and axillary nodes normal   Neurologic:  CNII-XII intact, normal strength, sensation and gait; reflexes 2+ and symmetric throughout    Psych:   Normal mood, affect, hygiene and grooming   ASSESSMENT/PLAN:  Encounter for Medicare annual wellness exam  Essential hypertension, benign - elevated today; counseled re: need to resume daily exercise, low sodium diet. Monitor regularly and f/u if persistently elevated - Plan: Comprehensive metabolic panel  Long term current use of anticoagulant therapy - no evidence of complications - Plan: CBC with Differential/Platelet  Paroxysmal atrial fibrillation with rapid ventricular response (HCC) - continue blood thinners, rate control; doing well  Gout of big toe - continue allopurinol, no flares  Urge incontinence of urine - continue Detrol LA   Mixed hyperlipidemia - due for  recheck, as she wasn't meeting goals on last check. Return for fasting labs (not fasting today) - Plan: Lipid panel  Fatigue, unspecified type - check labs; resume regular exercise - Plan: TSH, Comprehensive metabolic panel, CBC with Differential/Platelet   Pt to return for fasting labs--TSH, c-met, lipid, CBC  Restart mucinex   Discussed monthly self breast exams and yearly mammograms; at least 30 minutes of aerobic activity at least 5 days/week; weight-bearing exercise  at least 2x/wk; proper sunscreen use reviewed; healthy diet, including goals of calcium and vitamin D intake and alcohol recommendations (less than or equal to 1 drink/day) reviewed; regular seatbelt use; changing batteries in smoke detectors. Immunization recommendations discussed--pneuovax booster given; continue yearly high dose flu shots. Shingrix recommended. Colonoscopy recommendations reviewed--UTD. F/u not likely needed due to age, unless problems. Pap smear not indicated due to age and low risk.  DEXA--consider repeating 09/2017   She has a living will and a healthcare power of attorney. Full Code, Full Care. Encouraged to provide [place] a copy.      Medicare Attestation I have personally reviewed: The patient's medical and social history Their use of alcohol, tobacco or illicit drugs Their current medications and supplements The patient's functional ability including ADLs,fall risks, home safety risks, cognitive, and hearing and visual impairment Diet and physical activities Evidence for depression or mood disorders  The patient's weight, height and BMI have been recorded in the chart.  I have made referrals, counseling, and provided education to the patient based on review of the above and I have provided the patient with a written personalized care plan for preventive services.

## 2017-06-28 ENCOUNTER — Ambulatory Visit (INDEPENDENT_AMBULATORY_CARE_PROVIDER_SITE_OTHER): Payer: Medicare HMO | Admitting: Family Medicine

## 2017-06-28 ENCOUNTER — Encounter: Payer: Self-pay | Admitting: Family Medicine

## 2017-06-28 VITALS — BP 154/60 | HR 84 | Ht 59.75 in | Wt 156.6 lb

## 2017-06-28 DIAGNOSIS — Z0000 Encounter for general adult medical examination without abnormal findings: Secondary | ICD-10-CM | POA: Diagnosis not present

## 2017-06-28 DIAGNOSIS — R5383 Other fatigue: Secondary | ICD-10-CM | POA: Diagnosis not present

## 2017-06-28 DIAGNOSIS — N3941 Urge incontinence: Secondary | ICD-10-CM | POA: Diagnosis not present

## 2017-06-28 DIAGNOSIS — Z7901 Long term (current) use of anticoagulants: Secondary | ICD-10-CM | POA: Diagnosis not present

## 2017-06-28 DIAGNOSIS — I10 Essential (primary) hypertension: Secondary | ICD-10-CM | POA: Diagnosis not present

## 2017-06-28 DIAGNOSIS — I480 Paroxysmal atrial fibrillation: Secondary | ICD-10-CM

## 2017-06-28 DIAGNOSIS — E782 Mixed hyperlipidemia: Secondary | ICD-10-CM | POA: Diagnosis not present

## 2017-06-28 DIAGNOSIS — M109 Gout, unspecified: Secondary | ICD-10-CM

## 2017-06-28 NOTE — Patient Instructions (Addendum)
  HEALTH MAINTENANCE RECOMMENDATIONS:  It is recommended that you get at least 30 minutes of aerobic exercise at least 5 days/week (for weight loss, you may need as much as 60-90 minutes). This can be any activity that gets your heart rate up. This can be divided in 10-15 minute intervals if needed, but try and build up your endurance at least once a week.  Weight bearing exercise is also recommended twice weekly.  Eat a healthy diet with lots of vegetables, fruits and fiber.  "Colorful" foods have a lot of vitamins (ie green vegetables, tomatoes, red peppers, etc).  Limit sweet tea, regular sodas and alcoholic beverages, all of which has a lot of calories and sugar.  Up to 1 alcoholic drink daily may be beneficial for women (unless trying to lose weight, watch sugars).  Drink a lot of water.  Calcium recommendations are 1200-1500 mg daily (1500 mg for postmenopausal women or women without ovaries), and vitamin D 1000 IU daily.  This should be obtained from diet and/or supplements (vitamins), and calcium should not be taken all at once, but in divided doses.  Monthly self breast exams and yearly mammograms for women over the age of 74 is recommended.  Sunscreen of at least SPF 30 should be used on all sun-exposed parts of the skin when outside between the hours of 10 am and 4 pm (not just when at beach or pool, but even with exercise, golf, tennis, and yard work!)  Use a sunscreen that says "broad spectrum" so it covers both UVA and UVB rays, and make sure to reapply every 1-2 hours.  Remember to change the batteries in your smoke detectors when changing your clock times in the spring and fall.  Use your seat belt every time you are in a car, and please drive safely and not be distracted with cell phones and texting while driving.   Ms. Connie [name] , Thank you for taking time to come for your Medicare Wellness Visit. I appreciate your ongoing commitment to your health goals. Please review the  following plan we discussed and let me know if I can assist you in the future.   These are the goals we discussed: Goals    None      This is a list of the screening recommended for you and due dates:  Health Maintenance  Topic Date Due  . Tetanus Vaccine  12/05/2024  . Flu Shot  Completed  . DEXA scan (bone density measurement)  Completed  . Pneumonia vaccines  Completed   I recommend getting the new shingles vaccine (Shingrix). You will need to check with your insurance to see if it is covered, and if covered by Medicare Part D, you need to get from the pharmacy rather than our office.  It is a series of 2 injections, spaced 2 months apart.  Continue yearly mammograms (due next in July 2019).

## 2017-06-29 DIAGNOSIS — D225 Melanocytic nevi of trunk: Secondary | ICD-10-CM | POA: Diagnosis not present

## 2017-06-29 DIAGNOSIS — D2271 Melanocytic nevi of right lower limb, including hip: Secondary | ICD-10-CM | POA: Diagnosis not present

## 2017-06-29 DIAGNOSIS — L570 Actinic keratosis: Secondary | ICD-10-CM | POA: Diagnosis not present

## 2017-06-29 DIAGNOSIS — D220 Melanocytic nevi of lip: Secondary | ICD-10-CM | POA: Diagnosis not present

## 2017-06-29 DIAGNOSIS — D2272 Melanocytic nevi of left lower limb, including hip: Secondary | ICD-10-CM | POA: Diagnosis not present

## 2017-06-29 DIAGNOSIS — D2261 Melanocytic nevi of right upper limb, including shoulder: Secondary | ICD-10-CM | POA: Diagnosis not present

## 2017-06-29 DIAGNOSIS — D1801 Hemangioma of skin and subcutaneous tissue: Secondary | ICD-10-CM | POA: Diagnosis not present

## 2017-06-29 DIAGNOSIS — D2262 Melanocytic nevi of left upper limb, including shoulder: Secondary | ICD-10-CM | POA: Diagnosis not present

## 2017-07-01 ENCOUNTER — Other Ambulatory Visit: Payer: Medicare HMO

## 2017-07-01 DIAGNOSIS — R5383 Other fatigue: Secondary | ICD-10-CM

## 2017-07-01 DIAGNOSIS — Z7901 Long term (current) use of anticoagulants: Secondary | ICD-10-CM

## 2017-07-01 DIAGNOSIS — I10 Essential (primary) hypertension: Secondary | ICD-10-CM | POA: Diagnosis not present

## 2017-07-01 DIAGNOSIS — E782 Mixed hyperlipidemia: Secondary | ICD-10-CM

## 2017-07-02 LAB — COMPREHENSIVE METABOLIC PANEL
ALT: 18 IU/L (ref 0–32)
AST: 19 IU/L (ref 0–40)
Albumin/Globulin Ratio: 1.9 (ref 1.2–2.2)
Albumin: 4.1 g/dL (ref 3.5–4.7)
Alkaline Phosphatase: 87 IU/L (ref 39–117)
BUN/Creatinine Ratio: 18 (ref 12–28)
BUN: 19 mg/dL (ref 8–27)
Bilirubin Total: 0.4 mg/dL (ref 0.0–1.2)
CO2: 28 mmol/L (ref 20–29)
Calcium: 9.9 mg/dL (ref 8.7–10.3)
Chloride: 103 mmol/L (ref 96–106)
Creatinine, Ser: 1.06 mg/dL — ABNORMAL HIGH (ref 0.57–1.00)
GFR calc Af Amer: 55 mL/min/{1.73_m2} — ABNORMAL LOW (ref >59)
GFR calc non Af Amer: 47 mL/min/{1.73_m2} — ABNORMAL LOW (ref >59)
Globulin, Total: 2.2 g/dL (ref 1.5–4.5)
Glucose: 99 mg/dL (ref 65–99)
Potassium: 3.9 mmol/L (ref 3.5–5.2)
Sodium: 144 mmol/L (ref 134–144)
Total Protein: 6.3 g/dL (ref 6.0–8.5)

## 2017-07-02 LAB — CBC WITH DIFFERENTIAL/PLATELET
Basophils Absolute: 0 10*3/uL (ref 0.0–0.2)
Basos: 0 %
EOS (ABSOLUTE): 0.2 10*3/uL (ref 0.0–0.4)
Eos: 3 %
Hematocrit: 41.2 % (ref 34.0–46.6)
Hemoglobin: 13.6 g/dL (ref 11.1–15.9)
Immature Grans (Abs): 0 10*3/uL (ref 0.0–0.1)
Immature Granulocytes: 0 %
Lymphocytes Absolute: 1.7 10*3/uL (ref 0.7–3.1)
Lymphs: 29 %
MCH: 30.7 pg (ref 26.6–33.0)
MCHC: 33 g/dL (ref 31.5–35.7)
MCV: 93 fL (ref 79–97)
Monocytes Absolute: 0.4 10*3/uL (ref 0.1–0.9)
Monocytes: 7 %
Neutrophils Absolute: 3.8 10*3/uL (ref 1.4–7.0)
Neutrophils: 61 %
Platelets: 191 10*3/uL (ref 150–379)
RBC: 4.43 x10E6/uL (ref 3.77–5.28)
RDW: 14 % (ref 12.3–15.4)
WBC: 6.1 10*3/uL (ref 3.4–10.8)

## 2017-07-02 LAB — LIPID PANEL
Chol/HDL Ratio: 3.3 ratio (ref 0.0–4.4)
Cholesterol, Total: 153 mg/dL (ref 100–199)
HDL: 47 mg/dL (ref >39)
LDL Calculated: 64 mg/dL (ref 0–99)
Triglycerides: 209 mg/dL — ABNORMAL HIGH (ref 0–149)
VLDL Cholesterol Cal: 42 mg/dL — ABNORMAL HIGH (ref 5–40)

## 2017-07-02 LAB — TSH: TSH: 2.79 u[IU]/mL (ref 0.450–4.500)

## 2017-07-26 ENCOUNTER — Other Ambulatory Visit: Payer: Self-pay | Admitting: Family Medicine

## 2017-07-27 ENCOUNTER — Encounter: Payer: Self-pay | Admitting: Family Medicine

## 2017-07-27 ENCOUNTER — Ambulatory Visit (INDEPENDENT_AMBULATORY_CARE_PROVIDER_SITE_OTHER): Payer: Medicare HMO | Admitting: Family Medicine

## 2017-07-27 VITALS — BP 120/78 | HR 112 | Temp 97.8°F | Resp 16 | Wt 155.4 lb

## 2017-07-27 DIAGNOSIS — J329 Chronic sinusitis, unspecified: Secondary | ICD-10-CM | POA: Diagnosis not present

## 2017-07-27 DIAGNOSIS — J40 Bronchitis, not specified as acute or chronic: Secondary | ICD-10-CM

## 2017-07-27 DIAGNOSIS — I4891 Unspecified atrial fibrillation: Secondary | ICD-10-CM | POA: Diagnosis not present

## 2017-07-27 DIAGNOSIS — J069 Acute upper respiratory infection, unspecified: Secondary | ICD-10-CM | POA: Diagnosis not present

## 2017-07-27 MED ORDER — AZITHROMYCIN 250 MG PO TABS: ORAL_TABLET | ORAL | 0 refills | Status: DC

## 2017-07-27 NOTE — Progress Notes (Signed)
Chief Complaint  Patient presents with  . sick    drainage, cough, mucus dark and green out of nose and coughing it up too.- symptoms started thursday, no fever, stuffy   Noted some hoarseness in her voice 4 days ago, postnasal drainage. Right side of nose is draining clear mucus, but the left side is congested. The phlegm she is coughing up is thick and yellow, and is getting darker.  Today it was dark, with a "green cast".  No blood noticed.  No fevers.  She has some pressure across her forehead, ear pressure, and eyes seem like they are starting to drain.  She feels very fatigued. Up frequently last night (to void, and coughing).  Some chest congestion.  No shortness of breath. She drank a lot of water yesterday, was very thirsty (likely why she was up so much last night).  She used throat spray last week and cough drops.  No other OTC meds. Recent plane travel.  PMH, PSH, SH reviewed  Outpatient Encounter Medications as of 07/27/2017  Medication Sig Note  . acetaminophen (TYLENOL) 650 MG CR tablet Take 1,300 mg by mouth 2 (two) times daily.   Marland Kitchen allopurinol (ZYLOPRIM) 100 MG tablet TAKE TWO TABLETS BY MOUTH DAILY   . calcium carbonate (TUMS - DOSED IN MG ELEMENTAL CALCIUM) 500 MG chewable tablet Chew 1 tablet by mouth daily as needed for indigestion or heartburn. Reported on 07/10/2015 11/28/2015: Uses prn heartburn, every few months  . Calcium Carbonate-Vitamin D (CALTRATE 600+D) 600-400 MG-UNIT per tablet Take 1 tablet by mouth daily. Takes one in the am everyday and Monday,wednesday and Friday take one at night also   . CARTIA XT 120 MG 24 hr capsule TAKE ONE CAPSULE BY MOUTH DAILY   . colchicine 0.6 MG tablet Use as directed. Take two tablets by mouth at start of gout flare up. Then one hour later take one tablet by mouth.   . furosemide (LASIX) 20 MG tablet Take 0.5-1 tablets (10-20 mg total) by mouth daily as needed for fluid or edema. 05/23/2015: Rarely uses; swelling was worse in the  heat  . lisinopril (PRINIVIL,ZESTRIL) 40 MG tablet Take 0.5 tablets (20 mg total) by mouth daily.   . Multiple Vitamins-Minerals (CENTRUM SILVER PO) Take 1 tablet by mouth daily.     Marland Kitchen omeprazole (PRILOSEC) 20 MG capsule Take 1 capsule (20 mg total) by mouth daily. Reported on 05/23/2015 06/15/2016: Uses prn only, rarely  . pravastatin (PRAVACHOL) 40 MG tablet Take 1 tablet (40 mg total) daily by mouth. 06/28/2017: Taking 1/2 tablet daily  . tolterodine (DETROL LA) 4 MG 24 hr capsule TAKE ONE CAPSULE BY MOUTH DAILY   . XARELTO 15 MG TABS tablet TAKE ONE TABLET BY MOUTH DAILY WITH SUPPER. DISCONTINUE ELIQUIS 5MG    . [DISCONTINUED] solifenacin (VESICARE) 5 MG tablet Take 1 tablet (5 mg total) by mouth daily.    No facility-administered encounter medications on file as of 07/27/2017.    Allergies  Allergen Reactions  . Cephalexin Itching  . Ciprofloxacin Itching  . Diclofenac Nausea Only    Upset stomach  . Shellfish-Derived Products Nausea And Vomiting    Takes tylenol ES 2 twice daily, last dose was an hour ago.  ROS: no nausea, vomiting. She had diarrhea yesterday, 3x over 2 hours, once later that night.  Normal stool this morning.  No urinary complaints (just the frequency).  No bleeding, bruising or rashes.  No chest pain or shortness of breath.  PHYSICAL EXAM: BP  120/78   Pulse (!) 112   Temp 97.8 F (36.6 C) (Oral)   Resp 16   Wt 155 lb 6.4 oz (70.5 kg)   SpO2 98%   BMI 30.60 kg/m   Well-appearing, talkative female in no distress. No coughing during visit HEENT: conjunctiva and sclera are clear, EOMI. TM's and EAc's normal (only partially visualized on the right due to cerumen). Nasal mucosa is mildly edematous, clear mucus on the left. Sinuses are nontender, slightly tender at temporalis muscles. OP is notable for erythema down the right posterior throat. Neck: no lymphadenopathy or mass Heart: tachycardic, with irregularly irregular rhythm Lungs: clear bilaterally. No  wheezes, rales, ronchi Extremities: no edema Psych: normal mood, affect, hygiene and grooming. Neuro: alert and oriented, cranial nerves intact  ASSESSMENT/PLAN:  Upper respiratory tract infection, unspecified type  Sinobronchitis - Plan: azithromycin (ZITHROMAX) 250 MG tablet  Atrial fibrillation with rapid ventricular response ([place]) - advised to monitor pulse, and contact cardio if persistently >100    Drink plenty of fluids. (avoid dairy until your diarrhea resolves). Use Mucinex or other guaifenesin product (ie Robitussin). Consider sinus rinses if you are getting increased sinus pressure. Start the antibiotic if there is any fever (check tonight after tylenol has worn off). If you are not improving in the next 1-2 days.

## 2017-07-27 NOTE — Patient Instructions (Signed)
  Drink plenty of fluids. (avoid dairy until your diarrhea resolves). Use Mucinex or other guaifenesin product (ie Robitussin). Consider sinus rinses if you are getting increased sinus pressure. Start the antibiotic if there is any fever (check tonight after tylenol has worn off). If you are not improving in the next 1-2 days.

## 2017-08-05 ENCOUNTER — Telehealth: Payer: Self-pay | Admitting: *Deleted

## 2017-08-05 NOTE — Telephone Encounter (Signed)
We received a letter from [place] and I called patient to see if she has tried [place], oxybutynin or myrbetriq. They have given her a temporary supply of her Detrol LA 4mg  until we get back to them with this information. The letter is on my desk.

## 2017-08-16 ENCOUNTER — Telehealth: Payer: Self-pay

## 2017-08-16 NOTE — Telephone Encounter (Signed)
This was sent to me in response from the phone call that I placed last week to her-I am bringing you the PA, Dr Tomi Bamberger would like you to check on it. Thanks.

## 2017-08-16 NOTE — Telephone Encounter (Signed)
Patient stated she was taking Oxybutynin. Patient stated she would like to continue on the Tolterodine. She stated she has tried other generic and they didn't work. Please advise.

## 2017-08-19 ENCOUNTER — Telehealth: Payer: Self-pay | Admitting: Family Medicine

## 2017-08-19 NOTE — Telephone Encounter (Signed)
P.A. TOLTERADINE ER completed

## 2017-08-20 NOTE — Telephone Encounter (Signed)
P.A. Approved til 05/03/18  °

## 2017-08-21 NOTE — Telephone Encounter (Signed)
See 08/19/17 telephone call

## 2017-08-24 NOTE — Telephone Encounter (Signed)
Called Connie [name] and pt just picked up today $100 with insurance for 30 days.  Called pt and informed.  Cash price is $199

## 2017-09-06 ENCOUNTER — Other Ambulatory Visit: Payer: Self-pay | Admitting: Family Medicine

## 2017-09-07 ENCOUNTER — Encounter: Payer: Self-pay | Admitting: Sports Medicine

## 2017-09-07 ENCOUNTER — Ambulatory Visit: Payer: Medicare HMO | Admitting: Sports Medicine

## 2017-09-07 DIAGNOSIS — Z7901 Long term (current) use of anticoagulants: Secondary | ICD-10-CM | POA: Diagnosis not present

## 2017-09-07 DIAGNOSIS — M79674 Pain in right toe(s): Secondary | ICD-10-CM | POA: Diagnosis not present

## 2017-09-07 DIAGNOSIS — M79675 Pain in left toe(s): Secondary | ICD-10-CM | POA: Diagnosis not present

## 2017-09-07 DIAGNOSIS — B351 Tinea unguium: Secondary | ICD-10-CM | POA: Diagnosis not present

## 2017-09-07 NOTE — Progress Notes (Signed)
Subjective: Connie [name] is a 84 y.o. female patient seen today in office with complaint of painful thickened and elongated toenails; unable to trim. Patient denies changes with medical history; still on Xalerto for Afib. Patient has no other pedal complaints at this time.   Patient Active Problem List   Diagnosis Date Noted  . Chronic anticoagulation 05/11/2016  . Atrial fibrillation with rapid ventricular response ([place]) 06/24/2015  . Gout 10/30/2013  . Gout of big toe 10/30/2013  . Osteoarthritis of multiple joints 10/30/2013  . Breast cancer, right breast ([place]) 11/25/2012  . Urge incontinence of urine 04/24/2011  . OA (osteoarthritis) 04/24/2011  . Essential hypertension, benign 10/23/2010  . Pure hypercholesterolemia 10/23/2010    Current Outpatient Medications on File Prior to Visit  Medication Sig Dispense Refill  . acetaminophen (TYLENOL) 650 MG CR tablet Take 1,300 mg by mouth 2 (two) times daily.    Marland Kitchen allopurinol (ZYLOPRIM) 100 MG tablet TAKE TWO TABLETS BY MOUTH DAILY 180 tablet 0  . azithromycin (ZITHROMAX) 250 MG tablet Take 2 tablets by mouth on first day, then 1 tablet by mouth on days 2 through 5 6 tablet 0  . calcium carbonate (TUMS - DOSED IN MG ELEMENTAL CALCIUM) 500 MG chewable tablet Chew 1 tablet by mouth daily as needed for indigestion or heartburn. Reported on 07/10/2015    . Calcium Carbonate-Vitamin D (CALTRATE 600+D) 600-400 MG-UNIT per tablet Take 1 tablet by mouth daily. Takes one in the am everyday and Monday,wednesday and Friday take one at night also    . CARTIA XT 120 MG 24 hr capsule TAKE ONE CAPSULE BY MOUTH DAILY 90 capsule 2  . colchicine 0.6 MG tablet Use as directed. Take two tablets by mouth at start of gout flare up. Then one hour later take one tablet by mouth.    . furosemide (LASIX) 20 MG tablet Take 0.5-1 tablets (10-20 mg total) by mouth daily as needed for fluid or edema. 90 tablet 0  . lisinopril (PRINIVIL,ZESTRIL) 40 MG tablet Take 0.5  tablets (20 mg total) by mouth daily. 90 tablet 1  . Multiple Vitamins-Minerals (CENTRUM SILVER PO) Take 1 tablet by mouth daily.      Marland Kitchen omeprazole (PRILOSEC) 20 MG capsule Take 1 capsule (20 mg total) by mouth daily. Reported on 05/23/2015    . pravastatin (PRAVACHOL) 40 MG tablet Take 1 tablet (40 mg total) daily by mouth. 90 tablet 1  . tolterodine (DETROL LA) 4 MG 24 hr capsule TAKE ONE CAPSULE BY MOUTH DAILY 90 capsule 1  . XARELTO 15 MG TABS tablet TAKE ONE TABLET BY MOUTH DAILY WITH SUPPER. DISCONTINUE ELIQUIS 5MG  90 tablet 2  . [DISCONTINUED] solifenacin (VESICARE) 5 MG tablet Take 1 tablet (5 mg total) by mouth daily. 90 tablet 0   No current facility-administered medications on file prior to visit.     Allergies  Allergen Reactions  . Cephalexin Itching  . Ciprofloxacin Itching  . Diclofenac Nausea Only    Upset stomach  . Shellfish-Derived Products Nausea And Vomiting    Objective: Physical Exam  General: Well developed, nourished, no acute distress, awake, alert and oriented x 3  Vascular: Dorsalis pedis artery 1/4 bilateral, Posterior tibial artery 1/4 bilateral, skin temperature warm to warm proximal to distal bilateral lower extremities, no varicosities, pedal hair present bilateral.  Neurological: Gross sensation present via light touch bilateral.   Dermatological: Skin is warm, dry, and supple bilateral, Nails 1-10 are tender, long, thick, and discolored with mild subungal debris,  no webspace macerations present bilateral, no open lesions present bilateral, minimal reactive keratosis medial aspect of right bunion. No signs of infection bilateral.  Musculoskeletal: Asymptomatic bunion boney deformities noted bilateral. Muscular strength within normal limits without painon range of motion. No pain with calf compression bilateral.   Assessment and Plan:  Problem List Items Addressed This Visit    None    Visit Diagnoses    Dermatophytosis of nail    -  Primary    Anticoagulant long-term use       Toe pain, bilateral        -Examined patient.  -Discussed treatment options for painful mycotic nails  -Mechanically debrided and reduced mycotic nails with sterile nail nipper and dremel nail file without incident. -Patient to return in 2.5 months for follow up evaluation or sooner if symptoms worsen.  Landis Martins, DPM

## 2017-09-25 ENCOUNTER — Other Ambulatory Visit: Payer: Self-pay | Admitting: Family Medicine

## 2017-09-25 ENCOUNTER — Other Ambulatory Visit: Payer: Self-pay | Admitting: Interventional Cardiology

## 2017-09-25 DIAGNOSIS — E7800 Pure hypercholesterolemia, unspecified: Secondary | ICD-10-CM

## 2017-09-28 ENCOUNTER — Telehealth: Payer: Self-pay | Admitting: Family Medicine

## 2017-09-28 NOTE — Telephone Encounter (Signed)
Pt traveling to the [place], [place] areas on Monday. She is requesting Zpak or some antibiotic to take with her for traveling. Pt is allergic to Cipro and Cephalex which pt said are the common antibiotics kept on hand on cruise ship & given to pt's most time. Pt wants to have her own antibiotic that she is not allergic to in case she needs it. Send to [place]

## 2017-09-28 NOTE — Telephone Encounter (Signed)
I do not usually do this.  I will let you decide whether Eve does this with her patients.

## 2017-09-29 ENCOUNTER — Other Ambulatory Visit: Payer: Self-pay | Admitting: *Deleted

## 2017-09-29 DIAGNOSIS — J329 Chronic sinusitis, unspecified: Secondary | ICD-10-CM

## 2017-09-29 DIAGNOSIS — J40 Bronchitis, not specified as acute or chronic: Principal | ICD-10-CM

## 2017-09-29 MED ORDER — AZITHROMYCIN 250 MG PO TABS: ORAL_TABLET | ORAL | 0 refills | Status: DC

## 2017-09-29 NOTE — Telephone Encounter (Signed)
Looked back in chart and she was given zpak in 2015 for cruise travel and she states again last year just in case she gets a respiratory illness while on cruise. I will send a zpak for her-cleared with Dr.Lalonde and he approved. Just an FYI so you know.

## 2017-11-16 ENCOUNTER — Ambulatory Visit: Payer: Medicare HMO | Admitting: Sports Medicine

## 2017-11-16 ENCOUNTER — Encounter: Payer: Self-pay | Admitting: Sports Medicine

## 2017-11-16 DIAGNOSIS — B351 Tinea unguium: Secondary | ICD-10-CM

## 2017-11-16 DIAGNOSIS — M79675 Pain in left toe(s): Secondary | ICD-10-CM | POA: Diagnosis not present

## 2017-11-16 DIAGNOSIS — M79674 Pain in right toe(s): Secondary | ICD-10-CM | POA: Diagnosis not present

## 2017-11-16 DIAGNOSIS — Z7901 Long term (current) use of anticoagulants: Secondary | ICD-10-CM | POA: Diagnosis not present

## 2017-11-16 NOTE — Progress Notes (Signed)
Subjective: Connie [name] is a 84 y.o. female patient seen today in office with complaint of painful thickened and elongated toenails; unable to trim. Patient denies changes with medical history; still on Xalerto for Afib. Patient has no other pedal complaints at this time.   Patient Active Problem List   Diagnosis Date Noted  . Chronic anticoagulation 05/11/2016  . Atrial fibrillation with rapid ventricular response ([place]) 06/24/2015  . Gout 10/30/2013  . Gout of big toe 10/30/2013  . Osteoarthritis of multiple joints 10/30/2013  . Breast cancer, right breast ([place]) 11/25/2012  . Urge incontinence of urine 04/24/2011  . OA (osteoarthritis) 04/24/2011  . Essential hypertension, benign 10/23/2010  . Pure hypercholesterolemia 10/23/2010    Current Outpatient Medications on File Prior to Visit  Medication Sig Dispense Refill  . acetaminophen (TYLENOL) 650 MG CR tablet Take 1,300 mg by mouth 2 (two) times daily.    Marland Kitchen allopurinol (ZYLOPRIM) 100 MG tablet TAKE TWO TABLETS BY MOUTH DAILY 180 tablet 0  . azithromycin (ZITHROMAX) 250 MG tablet Take 2 tablets by mouth on first day, then 1 tablet by mouth on days 2 through 5 6 tablet 0  . calcium carbonate (TUMS - DOSED IN MG ELEMENTAL CALCIUM) 500 MG chewable tablet Chew 1 tablet by mouth daily as needed for indigestion or heartburn. Reported on 07/10/2015    . Calcium Carbonate-Vitamin D (CALTRATE 600+D) 600-400 MG-UNIT per tablet Take 1 tablet by mouth daily. Takes one in the am everyday and Monday,wednesday and Friday take one at night also    . CARTIA XT 120 MG 24 hr capsule TAKE ONE CAPSULE BY MOUTH DAILY 90 capsule 2  . colchicine 0.6 MG tablet Use as directed. Take two tablets by mouth at start of gout flare up. Then one hour later take one tablet by mouth.    . furosemide (LASIX) 20 MG tablet Take 0.5-1 tablets (10-20 mg total) by mouth daily as needed for fluid or edema. 90 tablet 0  . lisinopril (PRINIVIL,ZESTRIL) 40 MG tablet TAKE  ONE-HALF TABLET (20 MG TOTAL) BY MOUTH DAILY 45 tablet 0  . Multiple Vitamins-Minerals (CENTRUM SILVER PO) Take 1 tablet by mouth daily.      Marland Kitchen omeprazole (PRILOSEC) 20 MG capsule Take 1 capsule (20 mg total) by mouth daily. Reported on 05/23/2015    . pravastatin (PRAVACHOL) 40 MG tablet TAKE ONE TABLET BY MOUTH DAILY 90 tablet 0  . tolterodine (DETROL LA) 4 MG 24 hr capsule TAKE ONE CAPSULE BY MOUTH DAILY 90 capsule 1  . XARELTO 15 MG TABS tablet TAKE ONE TABLET BY MOUTH DAILY WITH SUPPER. DISCONTINUE ELIQUIS 5MG  90 tablet 2  . [DISCONTINUED] solifenacin (VESICARE) 5 MG tablet Take 1 tablet (5 mg total) by mouth daily. 90 tablet 0   No current facility-administered medications on file prior to visit.     Allergies  Allergen Reactions  . Cephalexin Itching  . Ciprofloxacin Itching  . Diclofenac Nausea Only    Upset stomach  . Shellfish-Derived Products Nausea And Vomiting    Objective: Physical Exam  General: Well developed, nourished, no acute distress, awake, alert and oriented x 3  Vascular: Dorsalis pedis artery 1/4 bilateral, Posterior tibial artery 1/4 bilateral, skin temperature warm to warm proximal to distal bilateral lower extremities, no varicosities, pedal hair present bilateral.  Neurological: Gross sensation present via light touch bilateral.   Dermatological: Skin is warm, dry, and supple bilateral, Nails 1-10 are tender, long, thick, and discolored with mild subungal debris, no webspace macerations  present bilateral, no open lesions present bilateral, mild reactive keratosis medial aspect of right bunion. No signs of infection bilateral.  Musculoskeletal: Asymptomatic bunion boney deformities noted bilateral. Muscular strength within normal limits without painon range of motion. No pain with calf compression bilateral.   Assessment and Plan:  Problem List Items Addressed This Visit    None    Visit Diagnoses    Dermatophytosis of nail    -  Primary    Anticoagulant long-term use       Toe pain, bilateral        -Examined patient.  -Discussed treatment options for painful mycotic nails  -Mechanically debrided and reduced mycotic nails with sterile nail nipper and dremel nail file without incident and smoothed callus at right bunion without incident -Patient to return in 2.5 months for follow up evaluation or sooner if symptoms worsen.  Landis Martins, DPM

## 2017-12-14 ENCOUNTER — Other Ambulatory Visit: Payer: Self-pay | Admitting: Family Medicine

## 2017-12-14 ENCOUNTER — Ambulatory Visit: Payer: Medicare HMO | Admitting: Sports Medicine

## 2017-12-16 ENCOUNTER — Encounter: Payer: Self-pay | Admitting: Interventional Cardiology

## 2017-12-20 ENCOUNTER — Other Ambulatory Visit: Payer: Self-pay | Admitting: Family Medicine

## 2017-12-26 NOTE — Progress Notes (Signed)
Chief Complaint  Patient presents with  . Hyperlipidemia    med check. Patient is not fasting.    Has been more tired recently.  Possibly due to the heat, and also she has been busy. Denies changes to hair/skin/bowels/weight, and overall feels pretty good.  Hypertension follow-up: Reports compliance with her medications, denies side effects. Hasn't been checking BP elsewhere. Denies dizziness, headaches, chest pain, edema. Denies side effects of medications.  She has been cutting the '40mg'$  tablets in half, and is asking for the lower dose tablet so she doesn't have to cut them. She reports that this rx usually comes from Dr. Tamala Julian, but she will run out before her appointment with him next month, she only has a few pills left. Had ham recently and a reuben for lunch today.  Atrial Fibrillation: She last saw Dr. Tamala Julian in July, 2018, scheduled again for September. She is not aware of palpitations or tachycardia.  She continues on Xarelto without complications. She has been taking Tylenol Arthritis 2 twice daily,  And discomfort is tolerable (feet, hands, back); not as well controlled as with Celebrex, but Dr. Tamala Julian agrees she should not use this while on blood thinners. (her husband asked to to ask again today about taking Celebrex).  Gout: She hasn't had any recurrent gout of her great toe, on her current dose of allopurinol ('200mg'$ ).   Hyperlipidemia: Compliant with '40mg'$  of pravastatin without side effects.  She hasn't tolerated fish oil in the past (due to belching). She doesn't fry at home, but does sometimes order fried food (just french fries, no other fried foods, or has fried fish when at the beach). She tries to follow a lowfat, low cholesterol diet.  Due for recheck. She is not fasting today.  Urge urinary incontinence--TakingDetrol LA'4mg'$ . She continues to get up at least 2-3x/night (which is better than it was). Deniesdry mouth, constipation.Denies any dysuria, odor or  hematuria. She wearsPoise pads daily, due to the urgency and incontinence if she is not close enough to the bathroom..  She tried low dose of Myrbetriq (at the same time she increased pravastatin, had some abdominal pain), preferred to go back to [employer]. We mentioned considering the higher dose (lower dose wasn't effective), if Detrol not working as well. Detrol is currently working okay, still has plenty. May consider re-trying Vesicare at some point. Sometimes she will have a few days in a row that are much worse, but then it reverts back to the normal routine.  She sometimes has caffeine around 5, and has at lunch.  GERD--ShetakesPrilosec prn, only rarely needs (once or twice a month or less)--if eating late or spicy food.  (when taking Celebrex, prior to being on blood thinners, she took this nightly).  Edema--usually more frequent with warmer temps--no problems this summer (just very slight at the ankles), hasn't taken any lasix in 2 years.  PMH, PSH, SH reviewed  Meds:No longer taking probiotic. Hasn't taken lasix in over 2 years.  Outpatient Encounter Medications as of 12/27/2017  Medication Sig Note  . acetaminophen (TYLENOL) 650 MG CR tablet Take 1,300 mg by mouth 2 (two) times daily.   Marland Kitchen allopurinol (ZYLOPRIM) 100 MG tablet TAKE TWO TABLETS BY MOUTH DAILY   . calcium carbonate (TUMS - DOSED IN MG ELEMENTAL CALCIUM) 500 MG chewable tablet Chew 1 tablet by mouth daily as needed for indigestion or heartburn. Reported on 07/10/2015 11/28/2015: Uses prn heartburn, every few months  . Calcium Carbonate-Vitamin D (CALTRATE 600+D) 600-400 MG-UNIT per tablet  Take 1 tablet by mouth daily. Takes one in the am everyday and Monday,wednesday and Friday take one at night also   . CARTIA XT 120 MG 24 hr capsule TAKE ONE CAPSULE BY MOUTH DAILY   . lisinopril (PRINIVIL,ZESTRIL) 40 MG tablet TAKE ONE-HALF TABLET (20 MG TOTAL) BY MOUTH DAILY   . Multiple Vitamins-Minerals (CENTRUM SILVER PO) Take 1  tablet by mouth daily.     Marland Kitchen omeprazole (PRILOSEC) 20 MG capsule Take 1 capsule (20 mg total) by mouth daily. Reported on 05/23/2015 06/15/2016: Uses prn only, rarely  . pravastatin (PRAVACHOL) 40 MG tablet TAKE ONE TABLET BY MOUTH DAILY   . tolterodine (DETROL LA) 4 MG 24 hr capsule TAKE ONE CAPSULE BY MOUTH DAILY   . XARELTO 15 MG TABS tablet TAKE ONE TABLET BY MOUTH DAILY WITH SUPPER. DISCONTINUE ELIQUIS 5MG   . colchicine 0.6 MG tablet Use as directed. Take two tablets by mouth at start of gout flare up. Then one hour later take one tablet by mouth.   . [DISCONTINUED] azithromycin (ZITHROMAX) 250 MG tablet Take 2 tablets by mouth on first day, then 1 tablet by mouth on days 2 through 5   . [DISCONTINUED] furosemide (LASIX) 20 MG tablet Take 0.5-1 tablets (10-20 mg total) by mouth daily as needed for fluid or edema. 05/23/2015: Rarely uses; swelling was worse in the heat  . [DISCONTINUED] solifenacin (VESICARE) 5 MG tablet Take 1 tablet (5 mg total) by mouth daily.    No facility-administered encounter medications on file as of 12/27/2017.    Allergies  Allergen Reactions  . Cephalexin Itching  . Ciprofloxacin Itching  . Diclofenac Nausea Only    Upset stomach  . Shellfish-Derived Products Nausea And Vomiting    ROS: no fever, chills, URI symptoms, headaches, dizziness, chest pain, shortness of breath, edema, nausea, vomiting, heartburn, bowel changes, bleeding, bruising, rash. +arthritis,, pain manageable with Tylenol Arthritis per HPI. +urinary incontinence as per HPI. Denies vaginal discharge, bleeding. Denies depression or other complaints. Some mild fatigue recently. See HPI.   PHYSICAL EXAM:  BP (!) 150/70 (BP Location: Left Arm, Cuff Size: Normal)   Pulse 84   Ht 4' 11.75" (1.518 m)   Wt 159 lb 12.8 oz (72.5 kg)   BMI 31.47 kg/m   Wt Readings from Last 3 Encounters:  12/27/17 159 lb 12.8 oz (72.5 kg)  07/27/17 155 lb 6.4 oz (70.5 kg)  06/28/17 156 lb 9.6 oz (71 kg)     Well developed, pleasant female, in good spirits HEENT: PERRL, EOMI, conjunctiva clear.OP is clear Neck: no lymphadenopathy, thyromegaly or carotid bruit Heart: regular rate and rhythm. No heart murmur Lungs: clear bilaterally Back: no CVA or spinal tenderness Abdomen: soft, nontender, no organomegaly or mass Extremities: No edema. 2+ pulses.  Skin: no bruising, rashes.  Neuro: alert and oriented. Cranial nerves, strength, sensation and gait all normal Psych: normal mood, affect, hygiene and grooming   ASSESSMENT/PLAN:  Essential hypertension, benign - elevated today, higher Na recently. Reviewed low Na diet; monitor BP elsewhere and record. Cont current meds; bring BP list to f/u with Dr. Tamala Julian and drop off - Plan: Comprehensive metabolic panel, lisinopril (PRINIVIL,ZESTRIL) 20 MG tablet  Mixed hyperlipidemia - Plan: Lipid panel  Long term current use of anticoagulant therapy - Plan: CBC with Differential/Platelet  Paroxysmal atrial fibrillation with rapid ventricular response ([facility]) - in NSR today; anticoagulated without adverse effects.  f/u with Dr. Tamala Julian as scheduled  Gout of big toe - continue allopurinol - Plan:  Uric acid  Urge incontinence of urine - Continue Detrol; will let us know if wants to re-try Vesicare or Myrbetriq  Medication monitoring encounter - Plan: Uric acid, CBC with Differential/Platelet, Lipid panel, Comprehensive metabolic panel  To return for fasting labs--Lipid, c-met, CBC, uric acid  Lisinopril--Change to 71m so no longer needs to cut Given 30d per pt request, to last unitl she sees Dr. STamala Julian Low Na diet reviewed. Reviewed recommendations re: Celebrex--NOT recommended for regular use, due to her being on oral anticoagulants.  Preferable to use Tylenol Arthritis for her pain (and/or topicals).  May use Celebrex sparingly, short-term only, as infrequent as possible.  Pt understands.   Check your blood pressure more regularly over the next  few weeks. Continue to limit the sodium in your diet and get regular exercise. Mail/fax/call/drop off your list sometime in the next month so that I can make sure your medications do not need to be adjusted.  Try and limit your caffeine intake, no more than 1/day and no later than 2pm.

## 2017-12-27 ENCOUNTER — Encounter: Payer: Self-pay | Admitting: Family Medicine

## 2017-12-27 ENCOUNTER — Ambulatory Visit (INDEPENDENT_AMBULATORY_CARE_PROVIDER_SITE_OTHER): Payer: Medicare HMO | Admitting: Family Medicine

## 2017-12-27 VITALS — BP 150/70 | HR 84 | Ht 59.75 in | Wt 159.8 lb

## 2017-12-27 DIAGNOSIS — Z7901 Long term (current) use of anticoagulants: Secondary | ICD-10-CM

## 2017-12-27 DIAGNOSIS — I10 Essential (primary) hypertension: Secondary | ICD-10-CM | POA: Diagnosis not present

## 2017-12-27 DIAGNOSIS — E782 Mixed hyperlipidemia: Secondary | ICD-10-CM | POA: Diagnosis not present

## 2017-12-27 DIAGNOSIS — M109 Gout, unspecified: Secondary | ICD-10-CM | POA: Diagnosis not present

## 2017-12-27 DIAGNOSIS — I480 Paroxysmal atrial fibrillation: Secondary | ICD-10-CM

## 2017-12-27 DIAGNOSIS — N3941 Urge incontinence: Secondary | ICD-10-CM | POA: Diagnosis not present

## 2017-12-27 DIAGNOSIS — Z5181 Encounter for therapeutic drug level monitoring: Secondary | ICD-10-CM

## 2017-12-27 MED ORDER — LISINOPRIL 20 MG PO TABS: 20.0000 mg | ORAL_TABLET | Freq: Every day | ORAL | 0 refills | Status: DC

## 2017-12-27 NOTE — Patient Instructions (Addendum)
Check your blood pressure more regularly over the next few weeks. Continue to limit the sodium in your diet and get regular exercise. Mail/fax/call/drop off your list sometime in the next month so that I can make sure your medications do not need to be adjusted. You should also bring this list to Dr. Tamala Julian when you see him in September.  Limit your lunch meats, processed foods. See below.  Try and limit your caffeine intake, no more than 1/day and no later than 2pm.   Low-Sodium Eating Plan Sodium, which is an element that makes up salt, helps you maintain a healthy balance of fluids in your body. Too much sodium can increase your blood pressure and cause fluid and waste to be held in your body. Your health care provider or dietitian may recommend following this plan if you have high blood pressure (hypertension), kidney disease, liver disease, or heart failure. Eating less sodium can help lower your blood pressure, reduce swelling, and protect your heart, liver, and kidneys. What are tips for following this plan? General guidelines  Most people on this plan should limit their sodium intake to 1,500-2,000 mg (milligrams) of sodium each day. Reading food labels  The Nutrition Facts label lists the amount of sodium in one serving of the food. If you eat more than one serving, you must multiply the listed amount of sodium by the number of servings.  Choose foods with less than 140 mg of sodium per serving.  Avoid foods with 300 mg of sodium or more per serving. Shopping  Look for lower-sodium products, often labeled as "low-sodium" or "no salt added."  Always check the sodium content even if foods are labeled as "unsalted" or "no salt added".  Buy fresh foods. ? Avoid canned foods and premade or frozen meals. ? Avoid canned, cured, or processed meats  Buy breads that have less than 80 mg of sodium per slice. Cooking  Eat more home-cooked food and less restaurant, buffet, and fast  food.  Avoid adding salt when cooking. Use salt-free seasonings or herbs instead of table salt or sea salt. Check with your health care provider or pharmacist before using salt substitutes.  Cook with plant-based oils, such as canola, sunflower, or olive oil. Meal planning  When eating at a restaurant, ask that your food be prepared with less salt or no salt, if possible.  Avoid foods that contain MSG (monosodium glutamate). MSG is sometimes added to [place] food, bouillon, and some canned foods. What foods are recommended? The items listed may not be a complete list. Talk with your dietitian about what dietary choices are best for you. Grains Low-sodium cereals, including oats, puffed wheat and rice, and shredded wheat. Low-sodium crackers. Unsalted rice. Unsalted pasta. Low-sodium bread. Whole-grain breads and whole-grain pasta. Vegetables Fresh or frozen vegetables. "No salt added" canned vegetables. "No salt added" tomato sauce and paste. Low-sodium or reduced-sodium tomato and vegetable juice. Fruits Fresh, frozen, or canned fruit. Fruit juice. Meats and other protein foods Fresh or frozen (no salt added) meat, poultry, seafood, and fish. Low-sodium canned tuna and salmon. Unsalted nuts. Dried peas, beans, and lentils without added salt. Unsalted canned beans. Eggs. Unsalted nut butters. Dairy Milk. Soy milk. Cheese that is naturally low in sodium, such as ricotta cheese, fresh mozzarella, or Swiss cheese Low-sodium or reduced-sodium cheese. Cream cheese. Yogurt. Fats and oils Unsalted butter. Unsalted margarine with no trans fat. Vegetable oils such as canola or olive oils. Seasonings and other foods Fresh and dried herbs  and spices. Salt-free seasonings. Low-sodium mustard and ketchup. Sodium-free salad dressing. Sodium-free light mayonnaise. Fresh or refrigerated horseradish. Lemon juice. Vinegar. Homemade, reduced-sodium, or low-sodium soups. Unsalted popcorn and pretzels. Low-salt  or salt-free chips. What foods are not recommended? The items listed may not be a complete list. Talk with your dietitian about what dietary choices are best for you. Grains Instant hot cereals. Bread stuffing, pancake, and biscuit mixes. Croutons. Seasoned rice or pasta mixes. Noodle soup cups. Boxed or frozen macaroni and cheese. Regular salted crackers. Self-rising flour. Vegetables Sauerkraut, pickled vegetables, and relishes. Olives. [place] fries. Onion rings. Regular canned vegetables (not low-sodium or reduced-sodium). Regular canned tomato sauce and paste (not low-sodium or reduced-sodium). Regular tomato and vegetable juice (not low-sodium or reduced-sodium). Frozen vegetables in sauces. Meats and other protein foods Meat or fish that is salted, canned, smoked, spiced, or pickled. Bacon, ham, sausage, hotdogs, corned beef, chipped beef, packaged lunch meats, salt pork, jerky, pickled herring, anchovies, regular canned tuna, sardines, salted nuts. Dairy Processed cheese and cheese spreads. Cheese curds. Blue cheese. Feta cheese. String cheese. Regular cottage cheese. Buttermilk. Canned milk. Fats and oils Salted butter. Regular margarine. Ghee. Bacon fat. Seasonings and other foods Onion salt, garlic salt, seasoned salt, table salt, and sea salt. Canned and packaged gravies. Worcestershire sauce. Tartar sauce. Barbecue sauce. Teriyaki sauce. Soy sauce, including reduced-sodium. Steak sauce. Fish sauce. Oyster sauce. Cocktail sauce. Horseradish that you find on the shelf. Regular ketchup and mustard. Meat flavorings and tenderizers. Bouillon cubes. Hot sauce and Tabasco sauce. Premade or packaged marinades. Premade or packaged taco seasonings. Relishes. Regular salad dressings. Salsa. Potato and tortilla chips. Corn chips and puffs. Salted popcorn and pretzels. Canned or dried soups. Pizza. Frozen entrees and pot pies. Summary  Eating less sodium can help lower your blood pressure, reduce  swelling, and protect your heart, liver, and kidneys.  Most people on this plan should limit their sodium intake to 1,500-2,000 mg (milligrams) of sodium each day.  Canned, boxed, and frozen foods are high in sodium. Restaurant foods, fast foods, and pizza are also very high in sodium. You also get sodium by adding salt to food.  Try to cook at home, eat more fresh fruits and vegetables, and eat less fast food, canned, processed, or prepared foods. This information is not intended to replace advice given to you by your health care provider. Make sure you discuss any questions you have with your health care provider. Document Released: 10/10/2001 Document Revised: 04/13/2016 Document Reviewed: 04/13/2016 Elsevier Interactive Patient Education  [employer].

## 2017-12-29 ENCOUNTER — Other Ambulatory Visit: Payer: Self-pay | Admitting: Family Medicine

## 2017-12-29 DIAGNOSIS — Z1231 Encounter for screening mammogram for malignant neoplasm of breast: Secondary | ICD-10-CM

## 2018-01-10 ENCOUNTER — Ambulatory Visit: Payer: Medicare HMO | Admitting: Interventional Cardiology

## 2018-01-10 ENCOUNTER — Encounter: Payer: Self-pay | Admitting: Interventional Cardiology

## 2018-01-10 VITALS — BP 156/72 | HR 75 | Ht 61.0 in | Wt 160.8 lb

## 2018-01-10 DIAGNOSIS — I4891 Unspecified atrial fibrillation: Secondary | ICD-10-CM | POA: Diagnosis not present

## 2018-01-10 DIAGNOSIS — I10 Essential (primary) hypertension: Secondary | ICD-10-CM

## 2018-01-10 DIAGNOSIS — Z7901 Long term (current) use of anticoagulants: Secondary | ICD-10-CM

## 2018-01-10 DIAGNOSIS — E7800 Pure hypercholesterolemia, unspecified: Secondary | ICD-10-CM

## 2018-01-10 MED ORDER — DILTIAZEM HCL ER COATED BEADS 120 MG PO CP24: 120.0000 mg | ORAL_CAPSULE | Freq: Every day | ORAL | 3 refills | Status: DC

## 2018-01-10 MED ORDER — LISINOPRIL-HYDROCHLOROTHIAZIDE 20-12.5 MG PO TABS: 1.0000 | ORAL_TABLET | Freq: Every day | ORAL | 3 refills | Status: DC

## 2018-01-10 NOTE — Progress Notes (Signed)
Cardiology Office Note:    Date:  01/10/2018   ID:  Julian Reil, DOB Jan 09, 1928, MRN [id number]  PCP:  Rita Ohara, MD  Cardiologist:  No primary care provider on file.   Referring MD: Rita Ohara, MD   Chief Complaint  Patient presents with  . Atrial Fibrillation    History of Present Illness:    Connie [name] is a 84 y.o. female with a hx of paroxysmal Atrial Fibrillation, history of hypertension, and hyperlipidemia.  She has some moderate fatigue otherwise no complaints.  Still attempts to exercise.  No falls or bleeding on Xarelto.  She wonders if atrial fib could be causing her fatigue.  States that she is fatigued today.  Concerned that her blood pressure has been elevated above 431 mmHg systolic.  Past Medical History:  Diagnosis Date  . Abnormal brain MRI 8/01   small hemorrhagic stroke and possible cavernous hemangioma (Dr. Erling Cruz)  . Adenomatous colon polyp 1/03  . Arthritis    OA spine, hands  . Breast cancer ([place]) 2001   R breast (T3N1, ER/PR+, HER-2 +) s/p mastectomy, chemo and chest wall irradiation (Dr. Truddie Coco)  . DDD (degenerative disc disease), lumbar   . GERD (gastroesophageal reflux disease)   . Hearing loss 2012   wears hearing aids  . Herpes zoster 2003  . Hyperlipidemia   . Hypertension   . Onychomycosis 2003, 2008   treated with Lamisil  . Osteopenia    (DEXA's done by Dr. Truddie Coco); prev took Fosamax.  No change in DEXA after off Fosamax x 2 years  . Ovarian cyst 12/2008   right  . Personal history of chemotherapy   . Personal history of radiation therapy   . Postmenopausal bleeding 2000   benign EMB (Dr. Raphael Gibney)  . Urge urinary incontinence     Past Surgical History:  Procedure Laterality Date  . CATARACT EXTRACTION  summer 2011   bilateral  . CHOLECYSTECTOMY, LAPAROSCOPIC  1/05  . COLONOSCOPY  12/17/08   normal (Dr. Wynetta Emery)  . COLONOSCOPY W/ POLYPECTOMY  05/23/01   adenomatous polyp  . MASTECTOMY Right 2001  . modified  mastectomy and tram flap reconstruction  2001   R breast  . TONSILLECTOMY    . tram flap removal     problems with flap after radiation  . TUBAL LIGATION      Current Medications: Current Meds  Medication Sig  . acetaminophen (TYLENOL) 650 MG CR tablet Take 1,300 mg by mouth 2 (two) times daily.  Marland Kitchen allopurinol (ZYLOPRIM) 100 MG tablet TAKE TWO TABLETS BY MOUTH DAILY  . calcium carbonate (TUMS - DOSED IN MG ELEMENTAL CALCIUM) 500 MG chewable tablet Chew 1 tablet by mouth daily as needed for indigestion or heartburn. Reported on 07/10/2015  . Calcium Carbonate-Vitamin D (CALTRATE 600+D) 600-400 MG-UNIT per tablet Take 1 tablet by mouth daily. Takes one in the am everyday and Monday,wednesday and Friday take one at night also  . CARTIA XT 120 MG 24 hr capsule TAKE ONE CAPSULE BY MOUTH DAILY  . colchicine 0.6 MG tablet Use as directed. Take two tablets by mouth at start of gout flare up. Then one hour later take one tablet by mouth.  Marland Kitchen lisinopril (PRINIVIL,ZESTRIL) 20 MG tablet Take 1 tablet (20 mg total) by mouth daily.  . Multiple Vitamins-Minerals (CENTRUM SILVER PO) Take 1 tablet by mouth daily.    Marland Kitchen omeprazole (PRILOSEC) 20 MG capsule Take 1 capsule (20 mg total) by mouth daily. Reported on 05/23/2015  .  pravastatin (PRAVACHOL) 40 MG tablet TAKE ONE TABLET BY MOUTH DAILY  . tolterodine (DETROL LA) 4 MG 24 hr capsule TAKE ONE CAPSULE BY MOUTH DAILY  . XARELTO 15 MG TABS tablet TAKE ONE TABLET BY MOUTH DAILY WITH SUPPER. DISCONTINUE ELIQUIS 5MG     Allergies:   Cephalexin; Ciprofloxacin; Diclofenac; and Shellfish-derived products   Social History   Socioeconomic History  . Marital status: Married    Spouse name: Not on file  . Number of children: 3  . Years of education: Not on file  . Highest education level: Not on file  Occupational History  . Not on file  Social Needs  . Financial resource strain: Not on file  . Food insecurity:    Worry: Not on file    Inability: Not on file   . Transportation needs:    Medical: Not on file    Non-medical: Not on file  Tobacco Use  . Smoking status: Never Smoker  . Smokeless tobacco: Never Used  Substance and Sexual Activity  . Alcohol use: No  . Drug use: No  . Sexual activity: Not Currently    Comment: due to poor libido  Lifestyle  . Physical activity:    Days per week: Not on file    Minutes per session: Not on file  . Stress: Not on file  Relationships  . Social connections:    Talks on phone: Not on file    Gets together: Not on file    Attends religious service: Not on file    Active member of club or organization: Not on file    Attends meetings of clubs or organizations: Not on file    Relationship status: Not on file  Other Topics Concern  . Not on file  Social History Narrative   Lives with husband, cat.  1 daughter at [employer]; daughter in [place] (works in Conservator, museum/gallery at [employer]), son in [place]. 8 grandchildren, 2 great-grandchildren, expecting a 3rd in May     Family History: The patient's family history includes Diabetes in her brother; Heart disease in her brother, father, and mother; Hypertension in her father and mother. There is no history of Drug abuse, Cancer, or Breast cancer.  ROS:   Please see the history of present illness.    Back pain, knee pain, some difficulty with balance, and fatigue as noted above.  All other systems reviewed and are negative.  EKGs/Labs/Other Studies Reviewed:    The following studies were reviewed today: None  EKG:  EKG is  ordered today.  The ekg ordered today demonstrates sinus rhythm with poor R wave progression and nonspecific ST T abnormalities.  Recent Labs: 07/01/2017: ALT 18; BUN 19; Creatinine, Ser 1.06; Hemoglobin 13.6; Platelets 191; Potassium 3.9; Sodium 144; TSH 2.790  Recent Lipid Panel    Component Value Date/Time   CHOL 153 07/01/2017 0956   TRIG 209 (H) 07/01/2017 0956   HDL 47 07/01/2017 0956   CHOLHDL 3.3 07/01/2017 0956   CHOLHDL  3.6 03/18/2017 1343   VLDL 41 (H) 12/08/2016 0755   LDLCALC 64 07/01/2017 0956   LDLCALC 84 03/18/2017 1343    Physical Exam:    VS:  BP (!) 156/72   Pulse 75   Ht '5\' 1"'  (1.549 m)   Wt 160 lb 12.8 oz (72.9 kg)   BMI 30.38 kg/m     Wt Readings from Last 3 Encounters:  01/10/18 160 lb 12.8 oz (72.9 kg)  12/27/17 159 lb 12.8 oz (  72.5 kg)  07/27/17 155 lb 6.4 oz (70.5 kg)     GEN:  Well nourished, well developed in no acute distress HEENT: Normal NECK: No JVD. LYMPHATICS: No lymphadenopathy CARDIAC: RRR, no murmur, no gallop, no edema. VASCULAR: 2+ radial bilateral pulses.  No bruits. RESPIRATORY:  Clear to auscultation without rales, wheezing or rhonchi  ABDOMEN: Soft, non-tender, non-distended, No pulsatile mass, MUSCULOSKELETAL: No deformity  SKIN: Warm and dry NEUROLOGIC:  Alert and oriented x 3 PSYCHIATRIC:  Normal affect   ASSESSMENT:    1. Atrial fibrillation with rapid ventricular response ([place])   2. Essential hypertension, benign   3. Chronic anticoagulation   4. Pure hypercholesterolemia    PLAN:    In order of problems listed above:  1. No evidence of atrial fibrillation on today's exam.  In sinus rhythm.  It seems unlikely that her complaint of fatigue has to do with atrial fibrillation.  30-day monitor demonstrated less than 20% incidence. 2. Blood pressure is elevated at 156/72.  Plan to intensify antihypertensive regimen by adding HCTZ 12.5 mg/day.  Therefore change lisinopril to lisinopril HCTZ 20/12.5 mg.  Be met in 7 to 14 days.  Monitor blood pressure at home. 3. Continue Xarelto. 4. Did not discuss lipids.  Clinical follow-up likely in 1 year prior to the end of blood pressure remains elevated.  Basic metabolic panel in 1 week after medication changes noted above.   Medication Adjustments/Labs and Tests Ordered: Current medicines are reviewed at length with the patient today.  Concerns regarding medicines are outlined above.  No orders of the  defined types were placed in this encounter.  No orders of the defined types were placed in this encounter.   There are no Patient Instructions on file for this visit.   Signed, Sinclair Grooms, MD  01/10/2018 4:40 PM    [facility]

## 2018-01-10 NOTE — Patient Instructions (Signed)
Medication Instructions:  Stop lisinopril. Start lisinopril /hctz  20/12.5 mg by mouth daily.   Labwork: Your physician recommends that you return for lab work on September 18,2019. The lab opens at 7:30 AM   Testing/Procedures: none  Follow-Up: Your physician wants you to follow-up in: 12 months.  You will receive a reminder letter in the mail two months in advance. If you don't receive a letter, please call our office to schedule the follow-up appointment.   Any Other Special Instructions Will Be Listed Below (If Applicable).  Check blood pressure at home and keep record. Call us in about a month with these readings.     If you need a refill on your cardiac medications before your next appointment, please call your pharmacy.

## 2018-01-13 ENCOUNTER — Other Ambulatory Visit: Payer: Medicare HMO

## 2018-01-13 DIAGNOSIS — I10 Essential (primary) hypertension: Secondary | ICD-10-CM | POA: Diagnosis not present

## 2018-01-13 DIAGNOSIS — E782 Mixed hyperlipidemia: Secondary | ICD-10-CM

## 2018-01-13 DIAGNOSIS — Z5181 Encounter for therapeutic drug level monitoring: Secondary | ICD-10-CM | POA: Diagnosis not present

## 2018-01-13 DIAGNOSIS — Z7901 Long term (current) use of anticoagulants: Secondary | ICD-10-CM

## 2018-01-13 DIAGNOSIS — M109 Gout, unspecified: Secondary | ICD-10-CM | POA: Diagnosis not present

## 2018-01-14 LAB — LIPID PANEL
Chol/HDL Ratio: 3.6 ratio (ref 0.0–4.4)
Cholesterol, Total: 153 mg/dL (ref 100–199)
HDL: 43 mg/dL (ref >39)
LDL Calculated: 78 mg/dL (ref 0–99)
Triglycerides: 159 mg/dL — ABNORMAL HIGH (ref 0–149)
VLDL Cholesterol Cal: 32 mg/dL (ref 5–40)

## 2018-01-14 LAB — CBC WITH DIFFERENTIAL/PLATELET
Basophils Absolute: 0.1 10*3/uL (ref 0.0–0.2)
Basos: 1 %
EOS (ABSOLUTE): 0.2 10*3/uL (ref 0.0–0.4)
Eos: 3 %
Hematocrit: 38.5 % (ref 34.0–46.6)
Hemoglobin: 13 g/dL (ref 11.1–15.9)
Immature Grans (Abs): 0 10*3/uL (ref 0.0–0.1)
Immature Granulocytes: 0 %
Lymphocytes Absolute: 2 10*3/uL (ref 0.7–3.1)
Lymphs: 33 %
MCH: 30.8 pg (ref 26.6–33.0)
MCHC: 33.8 g/dL (ref 31.5–35.7)
MCV: 91 fL (ref 79–97)
Monocytes Absolute: 0.4 10*3/uL (ref 0.1–0.9)
Monocytes: 7 %
Neutrophils Absolute: 3.3 10*3/uL (ref 1.4–7.0)
Neutrophils: 56 %
Platelets: 170 10*3/uL (ref 150–450)
RBC: 4.22 x10E6/uL (ref 3.77–5.28)
RDW: 12.9 % (ref 12.3–15.4)
WBC: 6 10*3/uL (ref 3.4–10.8)

## 2018-01-14 LAB — COMPREHENSIVE METABOLIC PANEL
ALT: 15 IU/L (ref 0–32)
AST: 22 IU/L (ref 0–40)
Albumin/Globulin Ratio: 2 (ref 1.2–2.2)
Albumin: 4.2 g/dL (ref 3.5–4.7)
Alkaline Phosphatase: 84 IU/L (ref 39–117)
BUN/Creatinine Ratio: 24 (ref 12–28)
BUN: 28 mg/dL — ABNORMAL HIGH (ref 8–27)
Bilirubin Total: 0.3 mg/dL (ref 0.0–1.2)
CO2: 23 mmol/L (ref 20–29)
Calcium: 10 mg/dL (ref 8.7–10.3)
Chloride: 100 mmol/L (ref 96–106)
Creatinine, Ser: 1.19 mg/dL — ABNORMAL HIGH (ref 0.57–1.00)
GFR calc Af Amer: 47 mL/min/{1.73_m2} — ABNORMAL LOW (ref >59)
GFR calc non Af Amer: 41 mL/min/{1.73_m2} — ABNORMAL LOW (ref >59)
Globulin, Total: 2.1 g/dL (ref 1.5–4.5)
Glucose: 101 mg/dL — ABNORMAL HIGH (ref 65–99)
Potassium: 4.4 mmol/L (ref 3.5–5.2)
Sodium: 139 mmol/L (ref 134–144)
Total Protein: 6.3 g/dL (ref 6.0–8.5)

## 2018-01-14 LAB — URIC ACID: Uric Acid: 4.5 mg/dL (ref 2.5–7.1)

## 2018-01-16 ENCOUNTER — Encounter: Payer: Self-pay | Admitting: Family Medicine

## 2018-01-18 ENCOUNTER — Other Ambulatory Visit: Payer: Self-pay | Admitting: Family Medicine

## 2018-01-18 ENCOUNTER — Ambulatory Visit: Payer: Medicare HMO | Admitting: Sports Medicine

## 2018-01-18 ENCOUNTER — Encounter: Payer: Self-pay | Admitting: Sports Medicine

## 2018-01-18 DIAGNOSIS — M79675 Pain in left toe(s): Secondary | ICD-10-CM

## 2018-01-18 DIAGNOSIS — M79671 Pain in right foot: Secondary | ICD-10-CM

## 2018-01-18 DIAGNOSIS — E7800 Pure hypercholesterolemia, unspecified: Secondary | ICD-10-CM

## 2018-01-18 DIAGNOSIS — M79674 Pain in right toe(s): Secondary | ICD-10-CM

## 2018-01-18 DIAGNOSIS — Z7901 Long term (current) use of anticoagulants: Secondary | ICD-10-CM | POA: Diagnosis not present

## 2018-01-18 DIAGNOSIS — B351 Tinea unguium: Secondary | ICD-10-CM

## 2018-01-18 NOTE — Progress Notes (Signed)
Subjective: Connie [name] is a 84 y.o. female patient seen today in office with complaint of painful thickened and elongated toenails; unable to trim. Patient denies changes with medical history; still on Xalerto for Afib. Patient also admits to a little bit of discomfort at the thickened callus over her right bunion.  Patient has no other pedal complaints at this time.   Patient Active Problem List   Diagnosis Date Noted  . Chronic anticoagulation 05/11/2016  . Atrial fibrillation with rapid ventricular response ([place]) 06/24/2015  . Gout 10/30/2013  . Gout of big toe 10/30/2013  . Osteoarthritis of multiple joints 10/30/2013  . Breast cancer, right breast ([place]) 11/25/2012  . Urge incontinence of urine 04/24/2011  . OA (osteoarthritis) 04/24/2011  . Essential hypertension, benign 10/23/2010  . Pure hypercholesterolemia 10/23/2010    Current Outpatient Medications on File Prior to Visit  Medication Sig Dispense Refill  . acetaminophen (TYLENOL) 650 MG CR tablet Take 1,300 mg by mouth 2 (two) times daily.    Marland Kitchen allopurinol (ZYLOPRIM) 100 MG tablet TAKE TWO TABLETS BY MOUTH DAILY 180 tablet 0  . calcium carbonate (TUMS - DOSED IN MG ELEMENTAL CALCIUM) 500 MG chewable tablet Chew 1 tablet by mouth daily as needed for indigestion or heartburn. Reported on 07/10/2015    . Calcium Carbonate-Vitamin D (CALTRATE 600+D) 600-400 MG-UNIT per tablet Take 1 tablet by mouth daily. Takes one in the am everyday and Monday,wednesday and Friday take one at night also    . colchicine 0.6 MG tablet Use as directed. Take two tablets by mouth at start of gout flare up. Then one hour later take one tablet by mouth.    . diltiazem (CARTIA XT) 120 MG 24 hr capsule Take 1 capsule (120 mg total) by mouth daily. 90 capsule 3  . lisinopril-hydrochlorothiazide (ZESTORETIC) 20-12.5 MG tablet Take 1 tablet by mouth daily. 90 tablet 3  . Multiple Vitamins-Minerals (CENTRUM SILVER PO) Take 1 tablet by mouth daily.       Marland Kitchen omeprazole (PRILOSEC) 20 MG capsule Take 1 capsule (20 mg total) by mouth daily. Reported on 05/23/2015    . tolterodine (DETROL LA) 4 MG 24 hr capsule TAKE ONE CAPSULE BY MOUTH DAILY 90 capsule 0  . XARELTO 15 MG TABS tablet TAKE ONE TABLET BY MOUTH DAILY WITH SUPPER. DISCONTINUE ELIQUIS 5MG  90 tablet 2  . [DISCONTINUED] solifenacin (VESICARE) 5 MG tablet Take 1 tablet (5 mg total) by mouth daily. 90 tablet 0   No current facility-administered medications on file prior to visit.     Allergies  Allergen Reactions  . Cephalexin Itching  . Ciprofloxacin Itching  . Diclofenac Nausea Only    Upset stomach  . Shellfish-Derived Products Nausea And Vomiting    Objective: Physical Exam  General: Well developed, nourished, no acute distress, awake, alert and oriented x 3  Vascular: Dorsalis pedis artery 1/4 bilateral, Posterior tibial artery 0/4 bilateral, skin temperature warm to warm proximal to distal bilateral lower extremities, trace edema at ankles bilateral, pedal hair present bilateral.  Neurological: Gross sensation present via light touch bilateral.   Dermatological: Skin is warm, dry, and supple bilateral, Nails 1-10 are tender, long, thick, and discolored with mild subungal debris, no webspace macerations present bilateral, no open lesions present bilateral, mild reactive keratosis medial aspect of right bunion. No signs of infection bilateral.  Musculoskeletal: Asymptomatic bunion boney deformities noted bilateral. Muscular strength within normal limits without painon range of motion. No pain with calf compression bilateral.   Assessment  and Plan:  Problem List Items Addressed This Visit    None    Visit Diagnoses    Dermatophytosis of nail    -  Primary   Anticoagulant long-term use       Toe pain, bilateral       Foot pain, right        -Examined patient.  -Discussed treatment options for painful mycotic nails  -Mechanically debrided and reduced mycotic nails with  sterile nail nipper and dremel nail file without incident and smoothed callus at right bunion without incident like previous -Patient to return in 2.5 months for follow up evaluation or sooner if symptoms worsen.  Landis Martins, DPM

## 2018-01-19 ENCOUNTER — Other Ambulatory Visit: Payer: Medicare HMO | Admitting: *Deleted

## 2018-01-19 DIAGNOSIS — I4891 Unspecified atrial fibrillation: Secondary | ICD-10-CM | POA: Diagnosis not present

## 2018-01-19 DIAGNOSIS — I10 Essential (primary) hypertension: Secondary | ICD-10-CM

## 2018-01-20 LAB — BASIC METABOLIC PANEL
BUN/Creatinine Ratio: 33 — ABNORMAL HIGH (ref 12–28)
BUN: 41 mg/dL — ABNORMAL HIGH (ref 8–27)
CO2: 26 mmol/L (ref 20–29)
Calcium: 10.4 mg/dL — ABNORMAL HIGH (ref 8.7–10.3)
Chloride: 101 mmol/L (ref 96–106)
Creatinine, Ser: 1.26 mg/dL — ABNORMAL HIGH (ref 0.57–1.00)
GFR calc Af Amer: 44 mL/min/{1.73_m2} — ABNORMAL LOW (ref >59)
GFR calc non Af Amer: 38 mL/min/{1.73_m2} — ABNORMAL LOW (ref >59)
Glucose: 86 mg/dL (ref 65–99)
Potassium: 4.5 mmol/L (ref 3.5–5.2)
Sodium: 143 mmol/L (ref 134–144)

## 2018-01-21 ENCOUNTER — Telehealth: Payer: Self-pay | Admitting: *Deleted

## 2018-01-21 DIAGNOSIS — I4891 Unspecified atrial fibrillation: Secondary | ICD-10-CM

## 2018-01-21 DIAGNOSIS — I10 Essential (primary) hypertension: Secondary | ICD-10-CM

## 2018-01-21 NOTE — Telephone Encounter (Signed)
Spoke with pt and went over lab results and recommendations.  Pt has been monitoring her BP off and on.  States last BP was 140's/??.  Advised pt to monitor BP over the next 2 weeks and call me with those recordings.  Pt will come for f/u labs on 10/18.  Pt verbalized understanding and was in agreement with this plan.

## 2018-01-21 NOTE — Telephone Encounter (Signed)
-----   Message from Belva Crome, MD sent at 01/21/2018  1:53 PM EDT ----- Let the patient know that kidney numbers are little worse and that blood work suggests the possibility of mild dehydration.  Make sure she is taking adequate fluid.  Has she been measuring her blood pressure at home?  If so she needs to give this some data.  Basic metabolic panel in 1 month. A copy will be sent to Rita Ohara, MD

## 2018-02-01 ENCOUNTER — Ambulatory Visit
Admission: RE | Admit: 2018-02-01 | Discharge: 2018-02-01 | Disposition: A | Discharge status: Home / Self Care | Payer: Medicare HMO | Source: Ambulatory Visit | Attending: Family Medicine | Admitting: Family Medicine

## 2018-02-01 DIAGNOSIS — Z1231 Encounter for screening mammogram for malignant neoplasm of breast: Secondary | ICD-10-CM

## 2018-02-17 DIAGNOSIS — R69 Illness, unspecified: Secondary | ICD-10-CM | POA: Diagnosis not present

## 2018-02-18 ENCOUNTER — Other Ambulatory Visit: Payer: Medicare HMO | Admitting: *Deleted

## 2018-02-18 ENCOUNTER — Telehealth: Payer: Self-pay | Admitting: Family Medicine

## 2018-02-18 DIAGNOSIS — I10 Essential (primary) hypertension: Secondary | ICD-10-CM

## 2018-02-18 DIAGNOSIS — I4891 Unspecified atrial fibrillation: Secondary | ICD-10-CM

## 2018-02-18 LAB — BASIC METABOLIC PANEL
BUN/Creatinine Ratio: 19 (ref 12–28)
BUN: 28 mg/dL — ABNORMAL HIGH (ref 8–27)
CO2: 23 mmol/L (ref 20–29)
Calcium: 9.7 mg/dL (ref 8.7–10.3)
Chloride: 101 mmol/L (ref 96–106)
Creatinine, Ser: 1.49 mg/dL — ABNORMAL HIGH (ref 0.57–1.00)
GFR calc Af Amer: 36 mL/min/{1.73_m2} — ABNORMAL LOW (ref >59)
GFR calc non Af Amer: 31 mL/min/{1.73_m2} — ABNORMAL LOW (ref >59)
Glucose: 97 mg/dL (ref 65–99)
Potassium: 4.1 mmol/L (ref 3.5–5.2)
Sodium: 141 mmol/L (ref 134–144)

## 2018-02-18 NOTE — Telephone Encounter (Signed)
Pt dropped of copy of  Her blood pressure log put in your folder for your review, pt can be reached at [phone] informed pt that your was out of the office today

## 2018-02-21 ENCOUNTER — Telehealth: Payer: Self-pay | Admitting: *Deleted

## 2018-02-21 DIAGNOSIS — Z79899 Other long term (current) drug therapy: Secondary | ICD-10-CM

## 2018-02-21 NOTE — Telephone Encounter (Signed)
Copy of BP list reviewed, note attached that she also provided list to Dr. Tamala Julian.  114-176 (varies widely from 120-150 mostly)/59-80. Pulse mostlyl 70's-80's. Once noted "afib", pulse was 109

## 2018-02-21 NOTE — Telephone Encounter (Signed)
-----   Message from Belva Crome, MD sent at 02/20/2018  9:59 PM EDT ----- Let the patient know there is slight decrease in kidney function. Need to hydrate and repeat BMET 1 week/ A copy will be sent to Rita Ohara, MD

## 2018-02-25 ENCOUNTER — Other Ambulatory Visit: Payer: Medicare HMO | Admitting: *Deleted

## 2018-02-25 DIAGNOSIS — Z79899 Other long term (current) drug therapy: Secondary | ICD-10-CM | POA: Diagnosis not present

## 2018-02-25 LAB — BASIC METABOLIC PANEL
BUN/Creatinine Ratio: 23 (ref 12–28)
BUN: 27 mg/dL (ref 8–27)
CO2: 25 mmol/L (ref 20–29)
Calcium: 10.2 mg/dL (ref 8.7–10.3)
Chloride: 101 mmol/L (ref 96–106)
Creatinine, Ser: 1.19 mg/dL — ABNORMAL HIGH (ref 0.57–1.00)
GFR calc Af Amer: 47 mL/min/{1.73_m2} — ABNORMAL LOW (ref >59)
GFR calc non Af Amer: 41 mL/min/{1.73_m2} — ABNORMAL LOW (ref >59)
Glucose: 96 mg/dL (ref 65–99)
Potassium: 4.1 mmol/L (ref 3.5–5.2)
Sodium: 142 mmol/L (ref 134–144)

## 2018-03-10 ENCOUNTER — Telehealth: Payer: Self-pay | Admitting: Family Medicine

## 2018-03-10 NOTE — Telephone Encounter (Signed)
Pt came in and dropped off a form to be completed. This is for [employer] and is a Warehouse manager information form. Pt requested completed form be faxed. Pt request form was completed giving authorization to do that. Sending form and fax request back.

## 2018-03-14 ENCOUNTER — Other Ambulatory Visit: Payer: Self-pay | Admitting: Interventional Cardiology

## 2018-03-14 ENCOUNTER — Other Ambulatory Visit: Payer: Self-pay | Admitting: Family Medicine

## 2018-03-24 ENCOUNTER — Other Ambulatory Visit: Payer: Self-pay | Admitting: Family Medicine

## 2018-03-29 ENCOUNTER — Encounter: Payer: Self-pay | Admitting: Sports Medicine

## 2018-03-29 ENCOUNTER — Ambulatory Visit: Payer: Medicare HMO | Admitting: Sports Medicine

## 2018-03-29 DIAGNOSIS — B351 Tinea unguium: Secondary | ICD-10-CM

## 2018-03-29 DIAGNOSIS — Z7901 Long term (current) use of anticoagulants: Secondary | ICD-10-CM | POA: Diagnosis not present

## 2018-03-29 DIAGNOSIS — M79674 Pain in right toe(s): Secondary | ICD-10-CM

## 2018-03-29 DIAGNOSIS — M79675 Pain in left toe(s): Secondary | ICD-10-CM

## 2018-03-29 NOTE — Progress Notes (Signed)
Subjective: Connie [name] is a 84 y.o. female patient seen today in office with complaint of painful thickened and elongated toenails; unable to trim patient admits to history of pain at the calluses especially on the plantar aspect of her left foot greater than her right foot. Patient denies changes with medical history; still on Xalerto for Afib. Patient has no other pedal complaints at this time.   Patient Active Problem List   Diagnosis Date Noted  . Chronic anticoagulation 05/11/2016  . Atrial fibrillation with rapid ventricular response ([place]) 06/24/2015  . Gout 10/30/2013  . Gout of big toe 10/30/2013  . Osteoarthritis of multiple joints 10/30/2013  . Breast cancer, right breast ([place]) 11/25/2012  . Urge incontinence of urine 04/24/2011  . OA (osteoarthritis) 04/24/2011  . Essential hypertension, benign 10/23/2010  . Pure hypercholesterolemia 10/23/2010    Current Outpatient Medications on File Prior to Visit  Medication Sig Dispense Refill  . acetaminophen (TYLENOL) 650 MG CR tablet Take 1,300 mg by mouth 2 (two) times daily.    Marland Kitchen allopurinol (ZYLOPRIM) 100 MG tablet TAKE TWO TABLETS BY MOUTH DAILY 180 tablet 1  . calcium carbonate (TUMS - DOSED IN MG ELEMENTAL CALCIUM) 500 MG chewable tablet Chew 1 tablet by mouth daily as needed for indigestion or heartburn. Reported on 07/10/2015    . Calcium Carbonate-Vitamin D (CALTRATE 600+D) 600-400 MG-UNIT per tablet Take 1 tablet by mouth daily. Takes one in the am everyday and Monday,wednesday and Friday take one at night also    . colchicine 0.6 MG tablet Use as directed. Take two tablets by mouth at start of gout flare up. Then one hour later take one tablet by mouth.    . diltiazem (CARTIA XT) 120 MG 24 hr capsule Take 1 capsule (120 mg total) by mouth daily. 90 capsule 3  . lisinopril-hydrochlorothiazide (ZESTORETIC) 20-12.5 MG tablet Take 1 tablet by mouth daily. 90 tablet 3  . Multiple Vitamins-Minerals (CENTRUM SILVER PO) Take 1  tablet by mouth daily.      Marland Kitchen omeprazole (PRILOSEC) 20 MG capsule Take 1 capsule (20 mg total) by mouth daily. Reported on 05/23/2015    . pravastatin (PRAVACHOL) 40 MG tablet TAKE ONE TABLET BY MOUTH DAILY 90 tablet 0  . Rivaroxaban (XARELTO) 15 MG TABS tablet Take 1 tablet (15 mg total) by mouth daily with supper. 90 tablet 1  . tolterodine (DETROL LA) 4 MG 24 hr capsule TAKE ONE CAPSULE BY MOUTH DAILY 90 capsule 0  . tolterodine (DETROL LA) 4 MG 24 hr capsule TAKE ONE CAPSULE BY MOUTH DAILY 90 capsule 0  . [DISCONTINUED] solifenacin (VESICARE) 5 MG tablet Take 1 tablet (5 mg total) by mouth daily. 90 tablet 0   No current facility-administered medications on file prior to visit.     Allergies  Allergen Reactions  . Cephalexin Itching  . Ciprofloxacin Itching  . Diclofenac Nausea Only    Upset stomach  . Shellfish-Derived Products Nausea And Vomiting    Objective: Physical Exam  General: Well developed, nourished, no acute distress, awake, alert and oriented x 3  Vascular: Dorsalis pedis artery 1/4 bilateral, Posterior tibial artery 0/4 bilateral, skin temperature warm to warm proximal to distal bilateral lower extremities, trace edema at ankles bilateral, pedal hair present bilateral.  Neurological: Gross sensation present via light touch bilateral.   Dermatological: Skin is warm, dry, and supple bilateral, Nails 1-10 are tender, long, thick, and discolored with mild subungal debris, no webspace macerations present bilateral, no open lesions present  bilateral, mild reactive keratosis medial aspect of right bunion and plantar left forefoot. No signs of infection bilateral.  Musculoskeletal: Asymptomatic bunion boney deformities noted bilateral. Muscular strength within normal limits without painon range of motion. No pain with calf compression bilateral.   Assessment and Plan:  Problem List Items Addressed This Visit    None    Visit Diagnoses    Dermatophytosis of nail    -   Primary   Anticoagulant long-term use       Toe pain, bilateral        -Examined patient.  -Discussed treatment options for painful mycotic nails  -Mechanically debrided and reduced mycotic nails with sterile nail nipper and dremel nail file without incident and at no additional charge mechanically debrided calluses x2 using a sterile chisel blade without incident -Recommend continue with the supportive shoes for foot type -Patient to return in 2.5 months for follow up evaluation or sooner if symptoms worsen.  Landis Martins, DPM

## 2018-04-05 DIAGNOSIS — H5211 Myopia, right eye: Secondary | ICD-10-CM | POA: Diagnosis not present

## 2018-04-05 DIAGNOSIS — Z961 Presence of intraocular lens: Secondary | ICD-10-CM | POA: Diagnosis not present

## 2018-04-05 DIAGNOSIS — H52222 Regular astigmatism, left eye: Secondary | ICD-10-CM | POA: Diagnosis not present

## 2018-04-05 DIAGNOSIS — H524 Presbyopia: Secondary | ICD-10-CM | POA: Diagnosis not present

## 2018-04-15 ENCOUNTER — Other Ambulatory Visit: Payer: Self-pay | Admitting: Family Medicine

## 2018-04-15 DIAGNOSIS — E7800 Pure hypercholesterolemia, unspecified: Secondary | ICD-10-CM

## 2018-06-07 ENCOUNTER — Encounter: Payer: Self-pay | Admitting: Sports Medicine

## 2018-06-07 ENCOUNTER — Ambulatory Visit: Payer: Medicare HMO | Admitting: Sports Medicine

## 2018-06-07 DIAGNOSIS — L84 Corns and callosities: Secondary | ICD-10-CM

## 2018-06-07 DIAGNOSIS — Z7901 Long term (current) use of anticoagulants: Secondary | ICD-10-CM

## 2018-06-07 DIAGNOSIS — M79674 Pain in right toe(s): Secondary | ICD-10-CM | POA: Diagnosis not present

## 2018-06-07 DIAGNOSIS — M79675 Pain in left toe(s): Secondary | ICD-10-CM

## 2018-06-07 DIAGNOSIS — B351 Tinea unguium: Secondary | ICD-10-CM

## 2018-06-07 NOTE — Progress Notes (Signed)
Subjective: Connie [name] is a 85 y.o. female patient seen today in office with complaint of painful thickened and elongated toenails; unable to trim patient admits a little pain today to her calluses. Patient denies changes with medical history; still on Xalerto for Afib. Patient has no other pedal complaints at this time.   Patient Active Problem List   Diagnosis Date Noted  . Chronic anticoagulation 05/11/2016  . Atrial fibrillation with rapid ventricular response ([place]) 06/24/2015  . Gout 10/30/2013  . Gout of big toe 10/30/2013  . Osteoarthritis of multiple joints 10/30/2013  . Breast cancer, right breast ([place]) 11/25/2012  . Urge incontinence of urine 04/24/2011  . OA (osteoarthritis) 04/24/2011  . Essential hypertension, benign 10/23/2010  . Pure hypercholesterolemia 10/23/2010    Current Outpatient Medications on File Prior to Visit  Medication Sig Dispense Refill  . acetaminophen (TYLENOL) 650 MG CR tablet Take 1,300 mg by mouth 2 (two) times daily.    Marland Kitchen allopurinol (ZYLOPRIM) 100 MG tablet TAKE TWO TABLETS BY MOUTH DAILY 180 tablet 1  . calcium carbonate (TUMS - DOSED IN MG ELEMENTAL CALCIUM) 500 MG chewable tablet Chew 1 tablet by mouth daily as needed for indigestion or heartburn. Reported on 07/10/2015    . Calcium Carbonate-Vitamin D (CALTRATE 600+D) 600-400 MG-UNIT per tablet Take 1 tablet by mouth daily. Takes one in the am everyday and Monday,wednesday and Friday take one at night also    . colchicine 0.6 MG tablet Use as directed. Take two tablets by mouth at start of gout flare up. Then one hour later take one tablet by mouth.    . diltiazem (CARTIA XT) 120 MG 24 hr capsule Take 1 capsule (120 mg total) by mouth daily. 90 capsule 3  . lisinopril-hydrochlorothiazide (ZESTORETIC) 20-12.5 MG tablet Take 1 tablet by mouth daily. 90 tablet 3  . Multiple Vitamins-Minerals (CENTRUM SILVER PO) Take 1 tablet by mouth daily.      Marland Kitchen omeprazole (PRILOSEC) 20 MG capsule Take 1  capsule (20 mg total) by mouth daily. Reported on 05/23/2015    . pravastatin (PRAVACHOL) 40 MG tablet TAKE ONE TABLET BY MOUTH DAILY 90 tablet 0  . Rivaroxaban (XARELTO) 15 MG TABS tablet Take 1 tablet (15 mg total) by mouth daily with supper. 90 tablet 1  . tolterodine (DETROL LA) 4 MG 24 hr capsule TAKE ONE CAPSULE BY MOUTH DAILY 90 capsule 0  . [DISCONTINUED] solifenacin (VESICARE) 5 MG tablet Take 1 tablet (5 mg total) by mouth daily. 90 tablet 0   No current facility-administered medications on file prior to visit.     Allergies  Allergen Reactions  . Cephalexin Itching  . Ciprofloxacin Itching  . Diclofenac Nausea Only    Upset stomach  . Shellfish-Derived Products Nausea And Vomiting    Objective: Physical Exam  General: Well developed, nourished, no acute distress, awake, alert and oriented x 3  Vascular: Dorsalis pedis artery 1/4 bilateral, Posterior tibial artery 0/4 bilateral, skin temperature warm to warm proximal to distal bilateral lower extremities, trace edema at ankles bilateral, pedal hair present bilateral.  Neurological: Gross sensation present via light touch bilateral.   Dermatological: Skin is warm, dry, and supple bilateral, Nails 1-10 are tender, long, thick, and discolored with mild subungal debris, no webspace macerations present bilateral, no open lesions present bilateral, mild reactive keratosis medial aspect of right bunion and plantar left forefoot. No signs of infection bilateral.  Musculoskeletal: Asymptomatic bunion boney deformities noted bilateral. Muscular strength within normal limits without painon  range of motion. No pain with calf compression bilateral.   Assessment and Plan:  Problem List Items Addressed This Visit    None    Visit Diagnoses    Dermatophytosis of nail    -  Primary   Anticoagulant long-term use       Toe pain, bilateral       Callus of foot        -Examined patient.  -Discussed treatment options for painful mycotic  nails  -Mechanically debrided and reduced mycotic nails with sterile nail nipper and dremel nail file without incident and at no additional charge mechanically debrided calluses x2 using a sterile chisel blade without incident -Recommend continue with the supportive shoes for foot type like previous -Patient to return in 2.5 months for follow up evaluation or sooner if symptoms worsen.  Landis Martins, DPM

## 2018-06-20 ENCOUNTER — Other Ambulatory Visit: Payer: Self-pay | Admitting: Family Medicine

## 2018-07-03 NOTE — Progress Notes (Signed)
Chief Complaint  Patient presents with  . Medicare Wellness    AWV with pelvic if needed. Patient is not fasting. Just used restroom, will try to give UA on way out. Has had cough for 3 weeks. Mucus is yellow/greenish and solid. No other symptoms. Has been having joint issues, her right hand has been giving her trouble recently.     Connie [name] is a 85 y.o. female who presents for annual physical exam, Medicare wellness visit and follow-up on chronic medical conditions.  She has the following concerns:  She has had a cough for 3 weeks. Phlegm is very thick, only coughs to clear up the thick phlegm, about 3-5x during the day, sometimes at night.  She hasn't taken any guaifenesin or other medications.  It looked yellow the last time she looked. No fever, chills. She denies any nasal congestion or runny nose.  She moved last week to [place], into a two bedroom apartment. Just signed a contract on her house, and her daughter took their cat. She hasn't been able to check her BP related to the move.   She has had some flare of pain and swelling in her fingers of her right hand.  Right pinkie was very painful 2 nights ago. She feels it is her arthritis, not gout. The joints affected were DIP's of the 4th and 5th fingers. Was worse 2 days ago, better since yesterday morning.  Hypertension follow-up: Reports compliance with her medications, denies side effects. BP was elevated on last visit to cardiologist in September, so her lisinopril was changed to lisinopril HCTZ 20/12.5m. b-met after starting the HCTZ showed that her Cr bumped up to 1.49.  She was encouraged to push fluids, and repeat Cr was improved at 1.19.  Potassium was normal. Hasn't checked BP since Thanksgiving, when she put her house on the market.  Can't remember if BP's were better after the change or not.  She did have some more fast food and change in diet during the moving process. Denies dizziness, headaches, chest  pain, edema, muscle cramps. Eating 1/2 banana daily.  Atrial Fibrillation: She last saw Dr. STamala Julianin 01/2018. She was in sinus rhythm at that time. She denies palpitations or tachycardia.  She continues on Xarelto without complications. She has been taking Tylenol Arthritis 2 twice daily, occasionally needs a dose mid-day. Overall, discomfort is tolerable (feet, hands, back); not as well controlled as with Celebrex, but Dr. STamala Julianagrees she should not use this while on blood thinners.   Gout: She hasn't had any recurrent gout of her great toe, on her current dose of allopurinol (2075m. Some swelling of DIP's of the right hand just recently, as reported above. HCTZ has been started since last check of uric acid. Due for recheck. Lab Results  Component Value Date   LABURIC 4.5 01/13/2018    Hyperlipidemia: Compliant with 4051mf pravastatin without side effects. She hasn't tolerated fish oil in the past (due to belching). She just moved, and is having 1 large meal a day at the dining hall. She tries to follow a lowfat, low cholesterol diet.  Admits to some worse food choices (fast food) during the moving process. Lab Results  Component Value Date   CHOL 153 01/13/2018   HDL 43 01/13/2018   LDLCALC 78 01/13/2018   TRIG 159 (H) 01/13/2018   CHOLHDL 3.6 01/13/2018   Urge urinary incontinence--TakingDetrol LA4mg24mhe continues to get up at least 2-3x/night(usually twice). Deniesdry mouth, constipation.Denies any  dysuria, odor or hematuria. She wearsPoise pads daily, due to the urgency and incontinence if she is not close enough to the bathroom.. She previously tried low dose of Myrbetriq (at the same time she increased pravastatin, had some abdominal pain), preferred to go back to [employer]. We previously mentioned considering the higher dose (lower dose wasn't effective), if Detrol not working as well.Detrol is currently working okay. Can also consider re-trying Vesicare at some  point if/when Detrol isn't as effective as desired. Sometimes she will have a few days in a row that are much worse, but then it reverts back to the normal routine. Has caffeine or tea occasionally, not daily.  GERD--ShetakesPrilosec prn, only rarely needs (once or twice a month or less)--if eating late or spicy food.  Gained a little weight after moving, and change in eating pattern/diet (dining hall for 1 large meal/day).  Reflux remains in control.   Immunization History  Administered Date(s) Administered  . Hepatitis A 11/06/1995, 08/25/1996  . IPV 11/06/1995  . Influenza Split 02/02/2011, 02/02/2012, 01/19/2013  . Influenza, High Dose Seasonal PF 02/24/2016, 02/01/2017, 02/17/2018  . Influenza-Unspecified 01/16/2014, 02/02/2015  . Meningococcal Polysaccharide 11/06/1995  . Pneumococcal Conjugate-13 05/01/2013  . Pneumococcal Polysaccharide-23 11/06/1995, 06/04/2005, 06/15/2016  . Tdap 11/06/1995, 06/04/2005, 06/16/2013, 12/06/2014  . Typhoid Live 11/15/1995, 04/29/2010, 06/10/2015  . Yellow Fever 04/04/2012  . Zoster 06/04/2005  . Zoster Recombinat (Shingrix) 08/20/2017, 11/18/2017   Last Pap smear: 6/09  Last mammogram:02/2018 Last colonoscopy: 12/2008  Last DEXA:09/2015 T-1.1 L fem neck Dentist: regular, 1-2 times/year(every 9 months) Ophtho: yearly  Exercise:No regular exercise since November (packing/moving). Plans to restart walking soon. No weight bearing exercise. She reports she has access to [employer]  where she now lives.  Other doctors caring for patient include: Ophtho: Dr. [place] Dentist: Dr. Altamese Pollock Oncologist: Dr. Humphrey Rolls (she has been released from her care) Derm: Dr. Derrel Nip GI: Dr. Wynetta Emery Podiatrist: Dr. Cannon Kettle Cardiologists: Dr. Tamala Julian, Dr. Rayann Heman   Depression screen: Negative Fall screen: negative Functional status screen: some trouble with her hearing aids (not working as well, plans to get checked, not wearing them today),  trouble with remembering names, chronic/unchanged; some knee pain with stairs only. Mini-Cog screen normal. See full questionnaires in epic.  End of Life Discussion: Patient hasa living will and medical power of attorney  Past Medical History:  Diagnosis Date  . Abnormal brain MRI 8/01   small hemorrhagic stroke and possible cavernous hemangioma (Dr. Erling Cruz)  . Adenomatous colon polyp 1/03  . Arthritis    OA spine, hands  . Breast cancer ([place]) 2001   R breast (T3N1, ER/PR+, HER-2 +) s/p mastectomy, chemo and chest wall irradiation (Dr. Truddie Coco)  . DDD (degenerative disc disease), lumbar   . GERD (gastroesophageal reflux disease)   . Hearing loss 2012   wears hearing aids  . Herpes zoster 2003  . Hyperlipidemia   . Hypertension   . Onychomycosis 2003, 2008   treated with Lamisil  . Osteopenia    (DEXA's done by Dr. Truddie Coco); prev took Fosamax.  No change in DEXA after off Fosamax x 2 years  . Ovarian cyst 12/2008   right  . Personal history of chemotherapy   . Personal history of radiation therapy   . Postmenopausal bleeding 2000   benign EMB (Dr. Raphael Gibney)  . Urge urinary incontinence     Past Surgical History:  Procedure Laterality Date  . CATARACT EXTRACTION  summer 2011   bilateral  . CHOLECYSTECTOMY, LAPAROSCOPIC  1/05  .  COLONOSCOPY  12/17/08   normal (Dr. Wynetta Emery)  . COLONOSCOPY W/ POLYPECTOMY  05/23/01   adenomatous polyp  . MASTECTOMY Right 2001  . modified mastectomy and tram flap reconstruction  2001   R breast  . TONSILLECTOMY    . tram flap removal     problems with flap after radiation  . TUBAL LIGATION      Social History   Socioeconomic History  . Marital status: Married    Spouse name: Not on file  . Number of children: 3  . Years of education: Not on file  . Highest education level: Not on file  Occupational History  . Not on file  Social Needs  . Financial resource strain: Not on file  . Food insecurity:    Worry: Not on file     Inability: Not on file  . Transportation needs:    Medical: Not on file    Non-medical: Not on file  Tobacco Use  . Smoking status: Never Smoker  . Smokeless tobacco: Never Used  Substance and Sexual Activity  . Alcohol use: No  . Drug use: No  . Sexual activity: Not Currently    Comment: due to poor libido and pain  Lifestyle  . Physical activity:    Days per week: Not on file    Minutes per session: Not on file  . Stress: Not on file  Relationships  . Social connections:    Talks on phone: Not on file    Gets together: Not on file    Attends religious service: Not on file    Active member of club or organization: Not on file    Attends meetings of clubs or organizations: Not on file    Relationship status: Not on file  . Intimate partner violence:    Fear of current or ex partner: Not on file    Emotionally abused: Not on file    Physically abused: Not on file    Forced sexual activity: Not on file  Other Topics Concern  . Not on file  Social History Narrative   Moved 06/2018 to [place]. Lives with husband.   1 daughter at [facility]--hospitalist; daughter in [place] (works in Conservator, museum/gallery at [employer] took in Barrister's clerk), son in [place]. 8 grandchildren, 4 great-grandchildren    Family History  Problem Relation Age of Onset  . Heart disease Mother   . Hypertension Mother   . Heart disease Father   . Hypertension Father   . Diabetes Brother   . Heart disease Brother   . Drug abuse Neg Hx   . Cancer Neg Hx   . Breast cancer Neg Hx     Outpatient Encounter Medications as of 07/04/2018  Medication Sig Note  . acetaminophen (TYLENOL) 650 MG CR tablet Take 1,300 mg by mouth 2 (two) times daily.   Marland Kitchen allopurinol (ZYLOPRIM) 100 MG tablet TAKE TWO TABLETS BY MOUTH DAILY   . calcium carbonate (TUMS - DOSED IN MG ELEMENTAL CALCIUM) 500 MG chewable tablet Chew 1 tablet by mouth daily as needed for indigestion or heartburn. Reported on 07/10/2015 11/28/2015: Uses prn heartburn,  every few months  . Calcium Carbonate-Vitamin D (CALTRATE 600+D) 600-400 MG-UNIT per tablet Take 1 tablet by mouth daily. Takes one in the am everyday and Monday,wednesday and Friday take one at night also   . diltiazem (CARTIA XT) 120 MG 24 hr capsule Take 1 capsule (120 mg total) by mouth daily.   Marland Kitchen lisinopril-hydrochlorothiazide (ZESTORETIC)  20-12.5 MG tablet Take 1 tablet by mouth daily.   . Multiple Vitamins-Minerals (CENTRUM SILVER PO) Take 1 tablet by mouth daily.     Marland Kitchen omeprazole (PRILOSEC) 20 MG capsule Take 1 capsule (20 mg total) by mouth daily. Reported on 05/23/2015 06/15/2016: Uses prn only, rarely  . pravastatin (PRAVACHOL) 40 MG tablet TAKE ONE TABLET BY MOUTH DAILY   . Rivaroxaban (XARELTO) 15 MG TABS tablet Take 1 tablet (15 mg total) by mouth daily with supper.   . colchicine 0.6 MG tablet Use as directed. Take two tablets by mouth at start of gout flare up. Then one hour later take one tablet by mouth.   . tolterodine (DETROL LA) 4 MG 24 hr capsule TAKE ONE CAPSULE BY MOUTH DAILY   . [DISCONTINUED] solifenacin (VESICARE) 5 MG tablet Take 1 tablet (5 mg total) by mouth daily.    No facility-administered encounter medications on file as of 07/04/2018.     Allergies  Allergen Reactions  . Cephalexin Itching  . Ciprofloxacin Itching  . Diclofenac Nausea Only    Upset stomach  . Shellfish-Derived Products Nausea And Vomiting    ROS: The patient denies anorexia, fever, weight changes (reports recent gain related to moving), headaches, vision changes, ear pain, sore throat, breast concerns, chest pain, dizziness, syncope, dyspnea on exertion, cough, swelling, nausea, vomiting, diarrhea, constipation, melena, hematochezia, indigestion/heartburn, hematuria, dysuria, vaginal bleeding, discharge, odor or itch, genital lesions, weakness, tremor, suspicious skin lesions, depression, anxiety, abnormal bleeding/bruising, or enlarged lymph nodes.  ongoing numbness in fingers and toes since  her chemo (most in the right hand, just at the tips, mild); no weakness. Mild, unchanged. +urge urinary incontinence (see HPI). +joint pains in hands/fingers/feet--treated with tylenol, per HPI; slight knee pain with stairs, intermittent +hearing loss--has hearing aids, needs new ones Cough x 3 weeks, slight throat irritation. Rare twinge of pain in her right lower abdomen, once or twice a week, short-lived.    PHYSICAL EXAM:  BP (!) 154/76   Pulse 88   Ht 4' 11.5" (1.511 m)   Wt 155 lb 3.2 oz (70.4 kg)   BMI 30.82 kg/m   Wt Readings from Last 3 Encounters:  07/04/18 155 lb 3.2 oz (70.4 kg)  01/10/18 160 lb 12.8 oz (72.9 kg)  12/27/17 159 lb 12.8 oz (72.5 kg)    General Appearance:  Alert, cooperative, no distress, appears stated age   Head:  Normocephalic, without obvious abnormality, atraumatic   Eyes:  PERRL, conjunctiva/corneas clear, EOM's intact, fundi benign   Ears:  Not wearing hearing aids.  Cerumen noted on the right. Left TM and EAC is normal  Nose:  Nares normal, mucosa is moderately edematous with clear mucus in nares, and distal crusting noted; no erythema or sinus tenderness   Throat:  Lips, mucosa, and tongue normal; teeth and gums normal.   Neck:  Supple, no lymphadenopathy; thyroid: no enlargement/ tenderness/nodules; no carotid bruit or JVD   Back:  Spine nontender, no curvature, ROM normal, no CVA tenderness   Lungs:  Clear to auscultation bilaterally without wheezes, rales or ronchi; respirations unlabored   Chest Wall:  No tenderness or deformity   Heart:  Regular rate and rhythm, S1 and S2 normal, no murmur, rub or gallop.  Breast Exam:  R breast absent, well healed surgical scars, nontender.Egg-sized soft tissue, cystic-swelling at proximal part of right pectoralis tendon (below proximal humerus), soft, mobile, nontender.  This is unchanged (she feels is related to how she wears the compression sleeve on  that arm,  reports that it varies in size--not wearing compression sleeve today and is same size as in past when she was wearing it). L breast normal-- no masses, or nipple discharge. nipple is mildly inverted, chronic. No axillary lymphadenopathy   Abdomen:  Soft, non-tender, nondistended, normoactive bowel sounds, no masses, no hepatosplenomegaly.   Genitalia:  Normal external genitalia without lesions. BUS and vagina normal,  + atrophic changes; no cervical motion tenderness. No abnormal vaginal discharge. Uterus normal size, nontender, no masses. No adnexal masses or tenderness; exam is somewhat limited by body habitus. Pap not performed   Rectal:  Normal tone, no masses or tenderness; guaiac negative stool   Extremities:  No clubbing or cyanosis. Bunion deformities noted bilaterally, R>L, slight hammertoe deformity of the R 2nd toe.  No edema.  Pulses:  2+ and symmetric all extremities   Skin:  Skin color, texture, turgor normal, no rashes or lesions. Many angiomason trunk  Lymph nodes:  Cervical, supraclavicular, and axillary nodes normal   Neurologic:  CNII-XII intact, normal strength, sensation and gait; reflexes 2+ and symmetric throughout    Psych: Normal mood, affect, hygiene and grooming    ASSESSMENT/PLAN:   Annual physical exam  Medicare annual wellness visit, subsequent  Essential hypertension, benign - BP elevated, recent poor diet and no exercise. Monitor regularly, low Na diet, exercise. f/u if remains above goal in 4-6 weeks - Plan: Comprehensive metabolic panel  Mixed hyperlipidemia - Plan: Lipid panel  Long term current use of anticoagulant therapy - Plan: CBC with Differential/Platelet  Paroxysmal atrial fibrillation with rapid ventricular response (HCC) - currently in NSR; on anticoagulant without complication  Urge incontinence of urine - doing okay on Detrol, declines changes  Gout of big toe - recheck uric acid level  since HCTZ was added by cardiologist. - Plan: Uric acid  Medication monitoring encounter - Plan: Comprehensive metabolic panel, Lipid panel, CBC with Differential/Platelet, Uric acid   Return for fasting labs c-met, lipid, CBC, uric acid (since HCTZ was started)  Discussed monthly self breast exams and yearly mammograms; at least 30 minutes of aerobic activity at least 5 days/week; weight-bearing exercise at least 2x/wk; proper sunscreen use reviewed; healthy diet, including goals of calcium and vitamin D intake and alcohol recommendations (less than or equal to 1 drink/day) reviewed; regular seatbelt use; changing batteries in smoke detectors. Immunization recommendations discussed--UTD, continue yearly high dose flu shots. Colonoscopy recommendations reviewed--last done 12/2008. F/u not likely needed due to age, unless problems. Pap smear not indicated due to age and low risk.  (DEXA--consider repeating in the next few years)  MOST form was reviewed and updated.  Full Code, full care.    Medicare Attestation I have personally reviewed: The patient's medical and social history Their use of alcohol, tobacco or illicit drugs Their current medications and supplements The patient's functional ability including ADLs,fall risks, home safety risks, cognitive, and hearing and visual impairment Diet and physical activities Evidence for depression or mood disorders  The patient's weight, height and BMI have been recorded in the chart.  I have made referrals, counseling, and provided education to the patient based on review of the above and I have provided the patient with a written personalized care plan for preventive services.

## 2018-07-03 NOTE — Patient Instructions (Addendum)
HEALTH MAINTENANCE RECOMMENDATIONS:  It is recommended that you get at least 30 minutes of aerobic exercise at least 5 days/week (for weight loss, you may need as much as 60-90 minutes). This can be any activity that gets your heart rate up. This can be divided in 10-15 minute intervals if needed, but try and build up your endurance at least once a week.  Weight bearing exercise is also recommended twice weekly.  Eat a healthy diet with lots of vegetables, fruits and fiber.  "Colorful" foods have a lot of vitamins (ie green vegetables, tomatoes, red peppers, etc).  Limit sweet tea, regular sodas and alcoholic beverages, all of which has a lot of calories and sugar.  Up to 1 alcoholic drink daily may be beneficial for women (unless trying to lose weight, watch sugars).  Drink a lot of water.  Calcium recommendations are 1200-1500 mg daily (1500 mg for postmenopausal women or women without ovaries), and vitamin D 1000 IU daily.  This should be obtained from diet and/or supplements (vitamins), and calcium should not be taken all at once, but in divided doses.  Monthly self breast exams and yearly mammograms for women over the age of 34 is recommended.  Sunscreen of at least SPF 30 should be used on all sun-exposed parts of the skin when outside between the hours of 10 am and 4 pm (not just when at beach or pool, but even with exercise, golf, tennis, and yard work!)  Use a sunscreen that says "broad spectrum" so it covers both UVA and UVB rays, and make sure to reapply every 1-2 hours.  Remember to change the batteries in your smoke detectors when changing your clock times in the spring and fall.  Use your seat belt every time you are in a car, and please drive safely and not be distracted with cell phones and texting while driving.   Ms. Sidman , Thank you for taking time to come for your Medicare Wellness Visit. I appreciate your ongoing commitment to your health goals. Please review the  following plan we discussed and let me know if I can assist you in the future.   This is a list of the screening recommended for you and due dates:  Health Maintenance  Topic Date Due  . Tetanus Vaccine  12/05/2024  . Flu Shot  Completed  . DEXA scan (bone density measurement)  Completed  . Pneumonia vaccines  Completed   Look into the [employer]  where you live.  Try taking guaifenesin (Mucinex) to help loosen up the phlegm and with chest congestion. This should make it thinner, and when thinner it will be easier to get up, and probably lighter in color.  If it isn't improving, please let us know.  I think the drainage is coming from your nose. Use saline spray regularly, and consider starting claritin once daily (for allergies/drainage).  Your blood pressure was high today.  Please monitor regularly elsewhere, and return in 1-2 months if you find they are consistently running 140 or higher. Limit the sodium in your diet.   Low-Sodium Eating Plan Sodium, which is an element that makes up salt, helps you maintain a healthy balance of fluids in your body. Too much sodium can increase your blood pressure and cause fluid and waste to be held in your body. Your health care provider or dietitian may recommend following this plan if you have high blood pressure (hypertension), kidney disease, liver disease, or heart failure. Eating less sodium can  help lower your blood pressure, reduce swelling, and protect your heart, liver, and kidneys. What are tips for following this plan? General guidelines  Most people on this plan should limit their sodium intake to 1,500-2,000 mg (milligrams) of sodium each day. Reading food labels   The Nutrition Facts label lists the amount of sodium in one serving of the food. If you eat more than one serving, you must multiply the listed amount of sodium by the number of servings.  Choose foods with less than 140 mg of sodium per serving.  Avoid  foods with 300 mg of sodium or more per serving. Shopping  Look for lower-sodium products, often labeled as "low-sodium" or "no salt added."  Always check the sodium content even if foods are labeled as "unsalted" or "no salt added".  Buy fresh foods. ? Avoid canned foods and premade or frozen meals. ? Avoid canned, cured, or processed meats  Buy breads that have less than 80 mg of sodium per slice. Cooking  Eat more home-cooked food and less restaurant, buffet, and fast food.  Avoid adding salt when cooking. Use salt-free seasonings or herbs instead of table salt or sea salt. Check with your health care provider or pharmacist before using salt substitutes.  Cook with plant-based oils, such as canola, sunflower, or olive oil. Meal planning  When eating at a restaurant, ask that your food be prepared with less salt or no salt, if possible.  Avoid foods that contain MSG (monosodium glutamate). MSG is sometimes added to [place] food, bouillon, and some canned foods. What foods are recommended? The items listed may not be a complete list. Talk with your dietitian about what dietary choices are best for you. Grains Low-sodium cereals, including oats, puffed wheat and rice, and shredded wheat. Low-sodium crackers. Unsalted rice. Unsalted pasta. Low-sodium bread. Whole-grain breads and whole-grain pasta. Vegetables Fresh or frozen vegetables. "No salt added" canned vegetables. "No salt added" tomato sauce and paste. Low-sodium or reduced-sodium tomato and vegetable juice. Fruits Fresh, frozen, or canned fruit. Fruit juice. Meats and other protein foods Fresh or frozen (no salt added) meat, poultry, seafood, and fish. Low-sodium canned tuna and salmon. Unsalted nuts. Dried peas, beans, and lentils without added salt. Unsalted canned beans. Eggs. Unsalted nut butters. Dairy Milk. Soy milk. Cheese that is naturally low in sodium, such as ricotta cheese, fresh mozzarella, or Swiss cheese  Low-sodium or reduced-sodium cheese. Cream cheese. Yogurt. Fats and oils Unsalted butter. Unsalted margarine with no trans fat. Vegetable oils such as canola or olive oils. Seasonings and other foods Fresh and dried herbs and spices. Salt-free seasonings. Low-sodium mustard and ketchup. Sodium-free salad dressing. Sodium-free light mayonnaise. Fresh or refrigerated horseradish. Lemon juice. Vinegar. Homemade, reduced-sodium, or low-sodium soups. Unsalted popcorn and pretzels. Low-salt or salt-free chips. What foods are not recommended? The items listed may not be a complete list. Talk with your dietitian about what dietary choices are best for you. Grains Instant hot cereals. Bread stuffing, pancake, and biscuit mixes. Croutons. Seasoned rice or pasta mixes. Noodle soup cups. Boxed or frozen macaroni and cheese. Regular salted crackers. Self-rising flour. Vegetables Sauerkraut, pickled vegetables, and relishes. Olives. [place] fries. Onion rings. Regular canned vegetables (not low-sodium or reduced-sodium). Regular canned tomato sauce and paste (not low-sodium or reduced-sodium). Regular tomato and vegetable juice (not low-sodium or reduced-sodium). Frozen vegetables in sauces. Meats and other protein foods Meat or fish that is salted, canned, smoked, spiced, or pickled. Bacon, ham, sausage, hotdogs, corned beef, chipped beef, packaged lunch  meats, salt pork, jerky, pickled herring, anchovies, regular canned tuna, sardines, salted nuts. Dairy Processed cheese and cheese spreads. Cheese curds. Blue cheese. Feta cheese. String cheese. Regular cottage cheese. Buttermilk. Canned milk. Fats and oils Salted butter. Regular margarine. Ghee. Bacon fat. Seasonings and other foods Onion salt, garlic salt, seasoned salt, table salt, and sea salt. Canned and packaged gravies. Worcestershire sauce. Tartar sauce. Barbecue sauce. Teriyaki sauce. Soy sauce, including reduced-sodium. Steak sauce. Fish sauce. Oyster  sauce. Cocktail sauce. Horseradish that you find on the shelf. Regular ketchup and mustard. Meat flavorings and tenderizers. Bouillon cubes. Hot sauce and Tabasco sauce. Premade or packaged marinades. Premade or packaged taco seasonings. Relishes. Regular salad dressings. Salsa. Potato and tortilla chips. Corn chips and puffs. Salted popcorn and pretzels. Canned or dried soups. Pizza. Frozen entrees and pot pies. Summary  Eating less sodium can help lower your blood pressure, reduce swelling, and protect your heart, liver, and kidneys.  Most people on this plan should limit their sodium intake to 1,500-2,000 mg (milligrams) of sodium each day.  Canned, boxed, and frozen foods are high in sodium. Restaurant foods, fast foods, and pizza are also very high in sodium. You also get sodium by adding salt to food.  Try to cook at home, eat more fresh fruits and vegetables, and eat less fast food, canned, processed, or prepared foods. This information is not intended to replace advice given to you by your health care provider. Make sure you discuss any questions you have with your health care provider. Document Released: 10/10/2001 Document Revised: 04/13/2016 Document Reviewed: 04/13/2016 Elsevier Interactive Patient Education  2019 [employer].

## 2018-07-04 ENCOUNTER — Ambulatory Visit (INDEPENDENT_AMBULATORY_CARE_PROVIDER_SITE_OTHER): Payer: Medicare HMO | Admitting: Family Medicine

## 2018-07-04 ENCOUNTER — Encounter: Payer: Self-pay | Admitting: Family Medicine

## 2018-07-04 VITALS — BP 154/76 | HR 88 | Ht 59.5 in | Wt 155.2 lb

## 2018-07-04 DIAGNOSIS — E782 Mixed hyperlipidemia: Secondary | ICD-10-CM

## 2018-07-04 DIAGNOSIS — Z0000 Encounter for general adult medical examination without abnormal findings: Secondary | ICD-10-CM

## 2018-07-04 DIAGNOSIS — I480 Paroxysmal atrial fibrillation: Secondary | ICD-10-CM

## 2018-07-04 DIAGNOSIS — I10 Essential (primary) hypertension: Secondary | ICD-10-CM

## 2018-07-04 DIAGNOSIS — Z7901 Long term (current) use of anticoagulants: Secondary | ICD-10-CM

## 2018-07-04 DIAGNOSIS — N3941 Urge incontinence: Secondary | ICD-10-CM | POA: Diagnosis not present

## 2018-07-04 DIAGNOSIS — Z5181 Encounter for therapeutic drug level monitoring: Secondary | ICD-10-CM | POA: Diagnosis not present

## 2018-07-04 DIAGNOSIS — M109 Gout, unspecified: Secondary | ICD-10-CM

## 2018-07-05 DIAGNOSIS — D2261 Melanocytic nevi of right upper limb, including shoulder: Secondary | ICD-10-CM | POA: Diagnosis not present

## 2018-07-05 DIAGNOSIS — L814 Other melanin hyperpigmentation: Secondary | ICD-10-CM | POA: Diagnosis not present

## 2018-07-05 DIAGNOSIS — L821 Other seborrheic keratosis: Secondary | ICD-10-CM | POA: Diagnosis not present

## 2018-07-05 DIAGNOSIS — D1801 Hemangioma of skin and subcutaneous tissue: Secondary | ICD-10-CM | POA: Diagnosis not present

## 2018-07-05 DIAGNOSIS — L720 Epidermal cyst: Secondary | ICD-10-CM | POA: Diagnosis not present

## 2018-07-07 ENCOUNTER — Other Ambulatory Visit: Payer: Medicare HMO

## 2018-07-08 ENCOUNTER — Other Ambulatory Visit: Payer: Medicare HMO

## 2018-07-08 DIAGNOSIS — I10 Essential (primary) hypertension: Secondary | ICD-10-CM | POA: Diagnosis not present

## 2018-07-08 DIAGNOSIS — Z5181 Encounter for therapeutic drug level monitoring: Secondary | ICD-10-CM | POA: Diagnosis not present

## 2018-07-08 DIAGNOSIS — M109 Gout, unspecified: Secondary | ICD-10-CM | POA: Diagnosis not present

## 2018-07-08 DIAGNOSIS — E782 Mixed hyperlipidemia: Secondary | ICD-10-CM

## 2018-07-08 DIAGNOSIS — Z7901 Long term (current) use of anticoagulants: Secondary | ICD-10-CM

## 2018-07-09 LAB — CBC WITH DIFFERENTIAL/PLATELET
Basophils Absolute: 0 10*3/uL (ref 0.0–0.2)
Basos: 1 %
EOS (ABSOLUTE): 0.2 10*3/uL (ref 0.0–0.4)
Eos: 3 %
Hematocrit: 38.2 % (ref 34.0–46.6)
Hemoglobin: 13 g/dL (ref 11.1–15.9)
Immature Grans (Abs): 0 10*3/uL (ref 0.0–0.1)
Immature Granulocytes: 0 %
Lymphocytes Absolute: 1.8 10*3/uL (ref 0.7–3.1)
Lymphs: 28 %
MCH: 31.3 pg (ref 26.6–33.0)
MCHC: 34 g/dL (ref 31.5–35.7)
MCV: 92 fL (ref 79–97)
Monocytes Absolute: 0.5 10*3/uL (ref 0.1–0.9)
Monocytes: 8 %
Neutrophils Absolute: 3.9 10*3/uL (ref 1.4–7.0)
Neutrophils: 60 %
Platelets: 182 10*3/uL (ref 150–450)
RBC: 4.15 x10E6/uL (ref 3.77–5.28)
RDW: 13 % (ref 11.7–15.4)
WBC: 6.5 10*3/uL (ref 3.4–10.8)

## 2018-07-09 LAB — LIPID PANEL
Chol/HDL Ratio: 3.8 ratio (ref 0.0–4.4)
Cholesterol, Total: 160 mg/dL (ref 100–199)
HDL: 42 mg/dL (ref >39)
LDL Calculated: 90 mg/dL (ref 0–99)
Triglycerides: 140 mg/dL (ref 0–149)
VLDL Cholesterol Cal: 28 mg/dL (ref 5–40)

## 2018-07-09 LAB — COMPREHENSIVE METABOLIC PANEL
ALT: 15 IU/L (ref 0–32)
AST: 20 IU/L (ref 0–40)
Albumin/Globulin Ratio: 2.1 (ref 1.2–2.2)
Albumin: 4.4 g/dL (ref 3.6–4.6)
Alkaline Phosphatase: 78 IU/L (ref 39–117)
BUN/Creatinine Ratio: 25 (ref 12–28)
BUN: 33 mg/dL — ABNORMAL HIGH (ref 8–27)
Bilirubin Total: 0.4 mg/dL (ref 0.0–1.2)
CO2: 26 mmol/L (ref 20–29)
Calcium: 10.3 mg/dL (ref 8.7–10.3)
Chloride: 101 mmol/L (ref 96–106)
Creatinine, Ser: 1.32 mg/dL — ABNORMAL HIGH (ref 0.57–1.00)
GFR calc Af Amer: 42 mL/min/{1.73_m2} — ABNORMAL LOW (ref >59)
GFR calc non Af Amer: 36 mL/min/{1.73_m2} — ABNORMAL LOW (ref >59)
Globulin, Total: 2.1 g/dL (ref 1.5–4.5)
Glucose: 99 mg/dL (ref 65–99)
Potassium: 4.3 mmol/L (ref 3.5–5.2)
Sodium: 142 mmol/L (ref 134–144)
Total Protein: 6.5 g/dL (ref 6.0–8.5)

## 2018-07-09 LAB — URIC ACID: Uric Acid: 6.1 mg/dL (ref 2.5–7.1)

## 2018-07-20 ENCOUNTER — Other Ambulatory Visit: Payer: Self-pay | Admitting: Family Medicine

## 2018-07-20 DIAGNOSIS — E7800 Pure hypercholesterolemia, unspecified: Secondary | ICD-10-CM

## 2018-08-16 ENCOUNTER — Ambulatory Visit: Payer: Medicare HMO | Admitting: Sports Medicine

## 2018-08-30 ENCOUNTER — Other Ambulatory Visit: Payer: Self-pay | Admitting: Interventional Cardiology

## 2018-09-27 ENCOUNTER — Other Ambulatory Visit: Payer: Self-pay | Admitting: Family Medicine

## 2018-09-27 NOTE — Telephone Encounter (Signed)
Is this okay to refill? 

## 2018-10-19 ENCOUNTER — Other Ambulatory Visit: Payer: Self-pay | Admitting: Family Medicine

## 2018-10-19 DIAGNOSIS — E7800 Pure hypercholesterolemia, unspecified: Secondary | ICD-10-CM

## 2018-10-25 ENCOUNTER — Ambulatory Visit: Payer: Medicare HMO | Admitting: Sports Medicine

## 2018-10-25 ENCOUNTER — Encounter: Payer: Self-pay | Admitting: Sports Medicine

## 2018-10-25 ENCOUNTER — Other Ambulatory Visit: Payer: Self-pay

## 2018-10-25 VITALS — Temp 97.7°F

## 2018-10-25 DIAGNOSIS — M79675 Pain in left toe(s): Secondary | ICD-10-CM

## 2018-10-25 DIAGNOSIS — Z7901 Long term (current) use of anticoagulants: Secondary | ICD-10-CM

## 2018-10-25 DIAGNOSIS — M79674 Pain in right toe(s): Secondary | ICD-10-CM | POA: Diagnosis not present

## 2018-10-25 DIAGNOSIS — B351 Tinea unguium: Secondary | ICD-10-CM | POA: Diagnosis not present

## 2018-10-25 NOTE — Progress Notes (Signed)
Subjective: Connie [name] is a 85 y.o. female patient seen today in office with complaint of painful thickened and elongated toenails; unable to trim patient admits a little pain today to her callus on the right at the bunion. Patient denies changes with medical history; still on Xalerto for Afib. Patient has no other pedal complaints at this time.   Patient Active Problem List   Diagnosis Date Noted  . Chronic anticoagulation 05/11/2016  . Atrial fibrillation with rapid ventricular response ([place]) 06/24/2015  . Gout 10/30/2013  . Gout of big toe 10/30/2013  . Osteoarthritis of multiple joints 10/30/2013  . Breast cancer, right breast ([place]) 11/25/2012  . Urge incontinence of urine 04/24/2011  . OA (osteoarthritis) 04/24/2011  . Essential hypertension, benign 10/23/2010  . Pure hypercholesterolemia 10/23/2010    Current Outpatient Medications on File Prior to Visit  Medication Sig Dispense Refill  . acetaminophen (TYLENOL) 650 MG CR tablet Take 1,300 mg by mouth 2 (two) times daily.    Marland Kitchen allopurinol (ZYLOPRIM) 100 MG tablet TAKE TWO TABLETS BY MOUTH DAILY 180 tablet 0  . calcium carbonate (TUMS - DOSED IN MG ELEMENTAL CALCIUM) 500 MG chewable tablet Chew 1 tablet by mouth daily as needed for indigestion or heartburn. Reported on 07/10/2015    . Calcium Carbonate-Vitamin D (CALTRATE 600+D) 600-400 MG-UNIT per tablet Take 1 tablet by mouth daily. Takes one in the am everyday and Monday,wednesday and Friday take one at night also    . colchicine 0.6 MG tablet Use as directed. Take two tablets by mouth at start of gout flare up. Then one hour later take one tablet by mouth.    . diltiazem (CARTIA XT) 120 MG 24 hr capsule Take 1 capsule (120 mg total) by mouth daily. 90 capsule 3  . lisinopril-hydrochlorothiazide (ZESTORETIC) 20-12.5 MG tablet Take 1 tablet by mouth daily. 90 tablet 3  . Multiple Vitamins-Minerals (CENTRUM SILVER PO) Take 1 tablet by mouth daily.      Marland Kitchen omeprazole  (PRILOSEC) 20 MG capsule Take 1 capsule (20 mg total) by mouth daily. Reported on 05/23/2015    . pravastatin (PRAVACHOL) 40 MG tablet TAKE ONE TABLET BY MOUTH DAILY 90 tablet 0  . tolterodine (DETROL LA) 4 MG 24 hr capsule TAKE ONE CAPSULE BY MOUTH DAILY 90 capsule 0  . XARELTO 15 MG TABS tablet TAKE ONE TABLET BY MOUTH DAILY WITH SUPPER. 90 tablet 3  . [DISCONTINUED] solifenacin (VESICARE) 5 MG tablet Take 1 tablet (5 mg total) by mouth daily. 90 tablet 0   No current facility-administered medications on file prior to visit.     Allergies  Allergen Reactions  . Cephalexin Itching  . Ciprofloxacin Itching  . Diclofenac Nausea Only    Upset stomach  . Shellfish-Derived Products Nausea And Vomiting    Objective: Physical Exam  General: Well developed, nourished, no acute distress, awake, alert and oriented x 3  Vascular: Dorsalis pedis artery 1/4 bilateral, Posterior tibial artery 0/4 bilateral, skin temperature warm to warm proximal to distal bilateral lower extremities, trace edema at ankles bilateral, pedal hair present bilateral.  Neurological: Gross sensation present via light touch bilateral.   Dermatological: Skin is warm, dry, and supple bilateral, Nails 1-10 are tender, long, thick, and discolored with mild subungal debris, no webspace macerations present bilateral, no open lesions present bilateral, mild reactive keratosis medial aspect of right bunion greater than plantar left forefoot. No signs of infection bilateral.  Musculoskeletal: Asymptomatic bunion boney deformities noted bilateral. Muscular strength within normal  limits without painon range of motion. No pain with calf compression bilateral.   Assessment and Plan:  Problem List Items Addressed This Visit    None    Visit Diagnoses    Dermatophytosis of nail    -  Primary   Toe pain, bilateral       Anticoagulant long-term use        -Examined patient.  -Discussed treatment options for painful mycotic nails   -Mechanically debrided and reduced mycotic nails with sterile nail nipper and dremel nail file without incident and at no additional charge mechanically debrided calluses x1 using a sterile chisel blade without incident -Patient to return in 2.5 months for follow up evaluation or sooner if symptoms worsen.  Landis Martins, DPM

## 2018-12-20 ENCOUNTER — Other Ambulatory Visit: Payer: Self-pay | Admitting: Family Medicine

## 2018-12-25 ENCOUNTER — Other Ambulatory Visit: Payer: Self-pay | Admitting: Family Medicine

## 2018-12-25 DIAGNOSIS — M109 Gout, unspecified: Secondary | ICD-10-CM

## 2018-12-25 DIAGNOSIS — I10 Essential (primary) hypertension: Secondary | ICD-10-CM

## 2018-12-25 DIAGNOSIS — Z5181 Encounter for therapeutic drug level monitoring: Secondary | ICD-10-CM

## 2018-12-25 DIAGNOSIS — Z7901 Long term (current) use of anticoagulants: Secondary | ICD-10-CM

## 2018-12-26 ENCOUNTER — Other Ambulatory Visit: Payer: Self-pay | Admitting: Family Medicine

## 2018-12-27 ENCOUNTER — Other Ambulatory Visit: Payer: Self-pay

## 2018-12-27 ENCOUNTER — Encounter: Payer: Self-pay | Admitting: Sports Medicine

## 2018-12-27 ENCOUNTER — Ambulatory Visit: Payer: Medicare HMO | Admitting: Sports Medicine

## 2018-12-27 VITALS — Temp 98.2°F

## 2018-12-27 DIAGNOSIS — M79674 Pain in right toe(s): Secondary | ICD-10-CM | POA: Diagnosis not present

## 2018-12-27 DIAGNOSIS — M79672 Pain in left foot: Secondary | ICD-10-CM | POA: Diagnosis not present

## 2018-12-27 DIAGNOSIS — M79671 Pain in right foot: Secondary | ICD-10-CM

## 2018-12-27 DIAGNOSIS — L84 Corns and callosities: Secondary | ICD-10-CM

## 2018-12-27 DIAGNOSIS — B351 Tinea unguium: Secondary | ICD-10-CM | POA: Diagnosis not present

## 2018-12-27 DIAGNOSIS — M79675 Pain in left toe(s): Secondary | ICD-10-CM

## 2018-12-27 DIAGNOSIS — Z7901 Long term (current) use of anticoagulants: Secondary | ICD-10-CM

## 2018-12-27 NOTE — Progress Notes (Signed)
Subjective: Connie [name] is a 85 y.o. female patient seen today in office with complaint of painful thickened and elongated toenails and callus; unable to trim. Patient denies changes with medical history; still on Xalerto for Afib. Patient has no other pedal complaints at this time.   Patient Active Problem List   Diagnosis Date Noted  . Chronic anticoagulation 05/11/2016  . Atrial fibrillation with rapid ventricular response ([place]) 06/24/2015  . Gout 10/30/2013  . Gout of big toe 10/30/2013  . Osteoarthritis of multiple joints 10/30/2013  . Breast cancer, right breast ([place]) 11/25/2012  . Urge incontinence of urine 04/24/2011  . OA (osteoarthritis) 04/24/2011  . Essential hypertension, benign 10/23/2010  . Pure hypercholesterolemia 10/23/2010    Current Outpatient Medications on File Prior to Visit  Medication Sig Dispense Refill  . acetaminophen (TYLENOL) 650 MG CR tablet Take 1,300 mg by mouth 2 (two) times daily.    Marland Kitchen allopurinol (ZYLOPRIM) 100 MG tablet TAKE TWO TABLETS BY MOUTH DAILY 180 tablet 0  . calcium carbonate (TUMS - DOSED IN MG ELEMENTAL CALCIUM) 500 MG chewable tablet Chew 1 tablet by mouth daily as needed for indigestion or heartburn. Reported on 07/10/2015    . Calcium Carbonate-Vitamin D (CALTRATE 600+D) 600-400 MG-UNIT per tablet Take 1 tablet by mouth daily. Takes one in the am everyday and Monday,wednesday and Friday take one at night also    . colchicine 0.6 MG tablet Use as directed. Take two tablets by mouth at start of gout flare up. Then one hour later take one tablet by mouth.    . diltiazem (CARTIA XT) 120 MG 24 hr capsule Take 1 capsule (120 mg total) by mouth daily. 90 capsule 3  . lisinopril-hydrochlorothiazide (ZESTORETIC) 20-12.5 MG tablet Take 1 tablet by mouth daily. 90 tablet 3  . Multiple Vitamins-Minerals (CENTRUM SILVER PO) Take 1 tablet by mouth daily.      Marland Kitchen omeprazole (PRILOSEC) 20 MG capsule Take 1 capsule (20 mg total) by mouth daily.  Reported on 05/23/2015    . pravastatin (PRAVACHOL) 40 MG tablet TAKE ONE TABLET BY MOUTH DAILY 90 tablet 0  . tolterodine (DETROL LA) 4 MG 24 hr capsule TAKE ONE CAPSULE BY MOUTH DAILY 90 capsule 0  . XARELTO 15 MG TABS tablet TAKE ONE TABLET BY MOUTH DAILY WITH SUPPER. 90 tablet 3   No current facility-administered medications on file prior to visit.     Allergies  Allergen Reactions  . Cephalexin Itching  . Ciprofloxacin Itching  . Diclofenac Nausea Only    Upset stomach  . Shellfish-Derived Products Nausea And Vomiting    Objective: Physical Exam  General: Well developed, nourished, no acute distress, awake, alert and oriented x 3  Vascular: Dorsalis pedis artery 1/4 bilateral, Posterior tibial artery 0/4 bilateral, skin temperature warm to warm proximal to distal bilateral lower extremities, trace edema at ankles bilateral, pedal hair present bilateral.  Neurological: Gross sensation present via light touch bilateral.   Dermatological: Skin is warm, dry, and supple bilateral, Nails 1-10 are tender, long, thick, and discolored with mild subungal debris, no webspace macerations present bilateral, no open lesions present bilateral, mild reactive keratosis medial aspect of right bunion greater than plantar left forefoot. No signs of infection bilateral.  Musculoskeletal: Asymptomatic bunion boney deformities noted bilateral. Muscular strength within normal limits without painon range of motion. No pain with calf compression bilateral.   Assessment and Plan:  Problem List Items Addressed This Visit    None    Visit Diagnoses  Pain due to onychomycosis of toenails of both feet    -  Primary   Callus of foot       Foot pain, bilateral       Anticoagulant long-term use         -Examined patient.  -Discussed treatment options for painful mycotic nails  -Mechanically debrided and reduced mycotic nails with sterile nail nipper and dremel nail file without incident and at no  additional charge mechanically debrided calluses x1 using a sterile chisel blade without incident -Continue with good supportive shoes for foot type -Patient to return in 2.5 to 3 months for follow up evaluation or sooner if symptoms worsen.  Landis Martins, DPM

## 2019-01-01 ENCOUNTER — Other Ambulatory Visit: Payer: Self-pay | Admitting: Interventional Cardiology

## 2019-01-03 NOTE — Progress Notes (Signed)
Chief Complaint  Patient presents with  . Hypertension    fsating med check. Going to see Dr.Smith next week, having issues bp going up and down. Is starting to notice when her heart goes out of rhythm.     She moved to [facility] about 6 months ago, into a two bedroom apartment. Her daughter took their cat (who is losing weight due to thyroid issue). It has been a little difficult to make new friends there, related to the COVID-19 pandemic. Temperature and pulse ox is being checked weekly. She misses her church friends, but otherwise is doing okay. They are getting out and walking some.  Hypertension follow-up: Reports compliance with her medications, denies side effects. BP was elevated on last visit to cardiologist in September 2019, so her lisinopril was changed to lisinopril HCTZ 20/12.39m. Swelling in legs has gone down since starting the diuretic. b-met after starting the HCTZ showed that her Cr bumped up to 1.49.  She was encouraged to push fluids, and repeat Cr was improved at 1.19. Creatinine was up to 1.32 again at her last check in March, was advised to drink more water. Due for recheck.  Potassium has been normal. She has f/u appt with cardiologist next week. Denies headaches, chest pain, edema, muscle cramps. Eating 1/2 banana daily. BP's have been down intermittently, and felt "not quite with it", a little weak, possibly dizzy.  Most of the times she felt bad and BP was low was when her monitor told her she was in afib.  Once she felt lightheaded, needed to sit down or else she felt like she could pass out.  She was in afib at that time, pulse was 107. She felt the worst when her Pulse was 107, other episodes of afib her pulse was 79, 86. BP's range from 90/59 up to 142/67, only 2 times below 100 (when in afib; other time she was in afib BP was 105/64). She tries to drink a lot of water, but does report that her urine looks pretty yellow (not pale, darkest in the  morning).  Atrial Fibrillation: She last saw Dr. STamala Julianin 01/2018. She was in sinus rhythm at that time. She has cardiology appt next week.  She denies palpitations or tachycardia.She feels a little dizzy/weak, "not feeling good" when she is in afib (her BP monitor tells her this).She continues on Xarelto without complications.Some bruising only if she bumps into something; no bleeding otherwise. She has been taking Tylenol Arthritis 2 twice daily. Overall, discomfort is tolerable (feet, hands, back); not as well controlled as with Celebrex, but Dr. STamala Julianagrees she should not use this while on blood thinners.  Gout: She hasn't had any recurrent gout of her great toe, on her current dose of allopurinol (2034m. Uric acid increased from 4.5 to 6.1 since HCTZ was added. Due for recheck. Occasional has pain "flare" in her left thumb and right pinkie, but NOT like gout--short-lived.  Lab Results  Component Value Date   LABURIC 6.1 07/08/2018   Hyperlipidemia: Compliant with [id number] pravastatin without side effects.She hasn't tolerated fish oil in the past (due to belching). She tries to follow a lowfat, low cholesterol diet. Her diet has changed a lot since she moved and her main meal of the day is prepared for her. She has a lot more cooked greens and carrots.  She has frequent BLT's, beef about once a week. 2 eggs/week or less. She eats a lot of pork and chicken. Lab Results  Component Value Date   CHOL 160 07/08/2018   HDL 42 07/08/2018   LDLCALC 90 07/08/2018   TRIG 140 07/08/2018   CHOLHDL 3.8 07/08/2018   Urge urinary incontinence--TakingDetrol LA25m. Most of the time it works well.  Occasionally has a night where she is up every 2 hours at night (no frequency during the day).  She usually gets up just 2x/night. Deniesdry mouth, constipation.Denies any dysuria, odor or hematuria. She wearsPoise pads daily, due to the urgencyand incontinence if she is notclose enough to the  bathroom.. She previously tried low dose of Myrbetriq (at the same time she increased pravastatin, had some abdominal pain), preferred to go back to [employer] We previously mentioned considering the higher dose (lower dose wasn't effective), if Detrol not working as well.Detrol is currently workingokay.   She gets unsweetened decaf tea with her meals.  GERD--ShetakesPrilosec prn, only rarely needs (once every 3-4 weeks).  Food served isn't spicy, and eats at 6pm. Denies dysphagia.   PMH, PSH, SH reviewed  Outpatient Encounter Medications as of 01/04/2019  Medication Sig Note  . acetaminophen (TYLENOL) 650 MG CR tablet Take 1,300 mg by mouth 2 (two) times daily.   .Marland Kitchenallopurinol (ZYLOPRIM) 100 MG tablet TAKE TWO TABLETS BY MOUTH DAILY   . calcium carbonate (TUMS - DOSED IN MG ELEMENTAL CALCIUM) 500 MG chewable tablet Chew 1 tablet by mouth daily as needed for indigestion or heartburn. Reported on 07/10/2015 11/28/2015: Uses prn heartburn, every few months  . Calcium Carbonate-Vitamin D (CALTRATE 600+D) 600-400 MG-UNIT per tablet Take 1 tablet by mouth daily. Takes one in the am everyday and Monday,wednesday and Friday take one at night also   . diltiazem (CARTIA XT) 120 MG 24 hr capsule Take 1 capsule (120 mg total) by mouth daily.   .Marland Kitchenlisinopril-hydrochlorothiazide (ZESTORETIC) 20-12.5 MG tablet TAKE ONE TABLET BY MOUTH DAILY   . Multiple Vitamins-Minerals (CENTRUM SILVER PO) Take 1 tablet by mouth daily.     .Marland Kitchenomeprazole (PRILOSEC) 20 MG capsule Take 1 capsule (20 mg total) by mouth daily. Reported on 05/23/2015 06/15/2016: Uses prn only, rarely  . pravastatin (PRAVACHOL) 40 MG tablet TAKE ONE TABLET BY MOUTH DAILY   . tolterodine (DETROL LA) 4 MG 24 hr capsule TAKE ONE CAPSULE BY MOUTH DAILY   . XARELTO 15 MG TABS tablet TAKE ONE TABLET BY MOUTH DAILY WITH SUPPER.   .Marland Kitchencolchicine 0.6 MG tablet Use as directed. Take two tablets by mouth at start of gout flare up. Then one hour later take one  tablet by mouth.   . [DISCONTINUED] lisinopril-hydrochlorothiazide (ZESTORETIC) 20-12.5 MG tablet Take 1 tablet by mouth daily.    No facility-administered encounter medications on file as of 01/04/2019.    Allergies  Allergen Reactions  . Cephalexin Itching  . Ciprofloxacin Itching  . Diclofenac Nausea Only    Upset stomach  . Shellfish-Derived Products Nausea And Vomiting   ROS: no fever, chills, headaches.  Dizziness/pre-syncope/low BP and afib per HPI.  No URI symptoms. She has some PND--loses voice late in the evening, has some phlegm in her throat in the morning, usually is clear. She sometimes sees discolored, but it always clears up. Dry mouth at night, sips a lot. No constipation, diarrhea, bowel changes, nausea, vomiting. No dysuria or hematuria (just frequency as per HPI) No bleeding, bruising or rashes.  She has gotten some bug bites on her lower legs from her evening walks. No bleeding or skin rashes, some bruising only if she bumps into  something. Moods are good.    PHYSICAL EXAM:  BP (!) 148/78   Pulse 80   Temp 98.4 F (36.9 C) (Other (Comment))   Ht 5' (1.524 m)   Wt 156 lb 12.8 oz (71.1 kg)   BMI 30.62 kg/m    132/68 on repeat by MD  Wt Readings from Last 3 Encounters:  01/04/19 156 lb 12.8 oz (71.1 kg)  07/04/18 155 lb 3.2 oz (70.4 kg)  01/10/18 160 lb 12.8 oz (72.9 kg)    Well developed, pleasant female, in good spirits, talkative. HEENT: PERRL, EOMI, conjunctiva clear. Sinuses are nontender. Wearing mask due to COVID-19 pandemic Neck: no lymphadenopathy, thyromegaly or carotid bruit Heart: regular rate and rhythm. No heart murmur Lungs: clear bilaterally Back: no CVA or spinal tenderness Abdomen: soft, nontender, no organomegaly or mass Extremities: No edema. 2+ pulses.  Skin: no rashes. Normal turgor. One bruise on her left forearm. Wearing compression sleeve on the left.  Some red papules (bug bites) on the lower legs/feet. Neuro: alert and  oriented. Normal gait. Psych: normal mood, affect, hygiene and grooming   ASSESSMENT/PLAN:  Essential hypertension, benign - BP normal today (higher initially); low when in afib.  To discuss with cardiology next week. Encouraged proper hydration - Plan: Comprehensive metabolic panel  Medication monitoring encounter - Plan: Comprehensive metabolic panel, Uric acid, CBC with Differential/Platelet  Gout of big toe - no gout flares; reviewed proper diet. Uric acid level bumped up since HCTZ added, recheck today and adjust dose of allpurinol if needed - Plan: Uric acid  Long term current use of anticoagulant therapy - Plan: CBC with Differential/Platelet  Need for influenza vaccination - Plan: Flu Vaccine QUAD High Dose(Fluad)  Mixed hyperlipidemia - rechecking lipids today due to change in diet since moving to [place]: Lipid panel  Paroxysmal atrial fibrillation with rapid ventricular response ([place]) - to d/w cardiologist, as she has been having episodes of afib where she isn't feeling well (low BP, pulse just a little fast). cont anticoag  Urge incontinence of urine - continue Detrol; discussed drinking more fluids earlier in the day.    Increase allopurinol dose if uric acid increased; not having flares. Rechecking lipids given change in diet.  Send to cardiologist, since she has appt next week.   F/u 6 mos CPE/AWV/med check with fasting labs prior c-met, lipid, CBC

## 2019-01-04 ENCOUNTER — Ambulatory Visit (INDEPENDENT_AMBULATORY_CARE_PROVIDER_SITE_OTHER): Payer: Medicare HMO | Admitting: Family Medicine

## 2019-01-04 ENCOUNTER — Other Ambulatory Visit: Payer: Self-pay

## 2019-01-04 ENCOUNTER — Encounter: Payer: Self-pay | Admitting: Family Medicine

## 2019-01-04 VITALS — BP 132/68 | HR 80 | Temp 98.4°F | Ht 60.0 in | Wt 156.8 lb

## 2019-01-04 DIAGNOSIS — Z23 Encounter for immunization: Secondary | ICD-10-CM | POA: Diagnosis not present

## 2019-01-04 DIAGNOSIS — Z7901 Long term (current) use of anticoagulants: Secondary | ICD-10-CM | POA: Diagnosis not present

## 2019-01-04 DIAGNOSIS — E782 Mixed hyperlipidemia: Secondary | ICD-10-CM | POA: Diagnosis not present

## 2019-01-04 DIAGNOSIS — E7800 Pure hypercholesterolemia, unspecified: Secondary | ICD-10-CM

## 2019-01-04 DIAGNOSIS — Z5181 Encounter for therapeutic drug level monitoring: Secondary | ICD-10-CM

## 2019-01-04 DIAGNOSIS — N3941 Urge incontinence: Secondary | ICD-10-CM

## 2019-01-04 DIAGNOSIS — I10 Essential (primary) hypertension: Secondary | ICD-10-CM

## 2019-01-04 DIAGNOSIS — M109 Gout, unspecified: Secondary | ICD-10-CM

## 2019-01-04 DIAGNOSIS — I480 Paroxysmal atrial fibrillation: Secondary | ICD-10-CM | POA: Diagnosis not present

## 2019-01-04 NOTE — Progress Notes (Signed)
Cardiology Office Note   Date:  01/11/2019   ID:  Connie [name], DOB 11-Jan-1928, MRN [id number]  PCP:  Rita Ohara, MD  Cardiologist: Dr. Daneen Schick, MD  Chief Complaint  Patient presents with  . Follow-up  . Atrial Fibrillation    History of Present Illness: Connie [name] is a 85 y.o. female with a history of paroxysmal atrial fibrillation, hypertension and hyperlipidemia.  Patient wore a monitor 11/24/2016 which showed PAF present 19% of the time.  She was continued on anticoagulation to prevent CVA.  She was last seen by Dr. Tamala Julian on 01/10/2018 in follow-up for atrial fibrillation.  She had some moderate complaints of fatigue but otherwise was doing well.  She was continued on Xarelto.  She was in normal sinus rhythm at that time. Given elevated BP of 156/72 HCTZ 12.5 mg was added to her regimen>> HCTZ 20/12.5 mg  She presents today for one-year follow-up seen for Dr. Tamala Julian. She is doing well from a cardiac perspective however reports possible episodes of atrial fibrillation. She comes in with an extensive BP and heart rate log. On 2 recent occasions 12/14/2018 and 12/21/2018 her BP was relatively low with SBP in the high 90-100 range in which her monitor reported that she was in atrial fibrillation.  Patient states that she was feeling poorly but with no palpitations, dizziness or shortness of breath.  This was concerning given her overall stability over the last several years.  Denies chest pain, shortness of breath, palpitations, LE swelling, orthopnea, dizziness or syncope.   Past Medical History:  Diagnosis Date  . Abnormal brain MRI 8/01   small hemorrhagic stroke and possible cavernous hemangioma (Dr. Erling Cruz)  . Adenomatous colon polyp 1/03  . Arthritis    OA spine, hands  . Breast cancer ([place]) 2001   R breast (T3N1, ER/PR+, HER-2 +) s/p mastectomy, chemo and chest wall irradiation (Dr. Truddie Coco)  . DDD (degenerative disc disease), lumbar   . GERD  (gastroesophageal reflux disease)   . Hearing loss 2012   wears hearing aids  . Herpes zoster 2003  . Hyperlipidemia   . Hypertension   . Onychomycosis 2003, 2008   treated with Lamisil  . Osteopenia    (DEXA's done by Dr. Truddie Coco); prev took Fosamax.  No change in DEXA after off Fosamax x 2 years  . Ovarian cyst 12/2008   right  . Personal history of chemotherapy   . Personal history of radiation therapy   . Postmenopausal bleeding 2000   benign EMB (Dr. Raphael Gibney)  . Urge urinary incontinence     Past Surgical History:  Procedure Laterality Date  . CATARACT EXTRACTION  summer 2011   bilateral  . CHOLECYSTECTOMY, LAPAROSCOPIC  1/05  . COLONOSCOPY  12/17/08   normal (Dr. Wynetta Emery)  . COLONOSCOPY W/ POLYPECTOMY  05/23/01   adenomatous polyp  . MASTECTOMY Right 2001  . modified mastectomy and tram flap reconstruction  2001   R breast  . TONSILLECTOMY    . tram flap removal     problems with flap after radiation  . TUBAL LIGATION       Current Outpatient Medications  Medication Sig Dispense Refill  . acetaminophen (TYLENOL) 650 MG CR tablet Take 1,300 mg by mouth 2 (two) times daily.    Marland Kitchen allopurinol (ZYLOPRIM) 100 MG tablet TAKE TWO TABLETS BY MOUTH DAILY 180 tablet 0  . calcium carbonate (TUMS - DOSED IN MG ELEMENTAL CALCIUM) 500 MG chewable tablet Chew  1 tablet by mouth daily as needed for indigestion or heartburn. Reported on 07/10/2015    . Calcium Carbonate-Vitamin D (CALTRATE 600+D) 600-400 MG-UNIT per tablet Take 1 tablet by mouth daily. Takes one in the am everyday and Monday,wednesday and Friday take one at night also    . colchicine 0.6 MG tablet Use as directed. Take two tablets by mouth at start of gout flare up. Then one hour later take one tablet by mouth.    . diltiazem (CARTIA XT) 120 MG 24 hr capsule Take 1 capsule (120 mg total) by mouth daily. 90 capsule 3  . lisinopril-hydrochlorothiazide (ZESTORETIC) 20-12.5 MG tablet TAKE ONE TABLET BY MOUTH DAILY 90 tablet 2   . Multiple Vitamins-Minerals (CENTRUM SILVER PO) Take 1 tablet by mouth daily.      Marland Kitchen omeprazole (PRILOSEC) 20 MG capsule Take 1 capsule (20 mg total) by mouth daily. Reported on 05/23/2015    . pravastatin (PRAVACHOL) 40 MG tablet Take 1 tablet (40 mg total) by mouth daily. 90 tablet 1  . tolterodine (DETROL LA) 4 MG 24 hr capsule TAKE ONE CAPSULE BY MOUTH DAILY 90 capsule 0  . XARELTO 15 MG TABS tablet TAKE ONE TABLET BY MOUTH DAILY WITH SUPPER. 90 tablet 3   No current facility-administered medications for this visit.     Allergies:   Cephalexin, Ciprofloxacin, Diclofenac, and Shellfish-derived products    Social History:  The patient  reports that she has never smoked. She has never used smokeless tobacco. She reports that she does not drink alcohol or use drugs.   Family History:  The patient's family history includes Diabetes in her brother; Heart disease in her brother, father, and mother; Hypertension in her father and mother.    ROS:  Please see the history of present illness. Otherwise, review of systems are positive for none.  All other systems are reviewed and negative.    PHYSICAL EXAM: VS:  BP (!) 142/80   Pulse 81   Ht 5' (1.524 m)   Wt 156 lb 3.2 oz (70.9 kg)   SpO2 98%   BMI 30.51 kg/m  , BMI Body mass index is 30.51 kg/m.   General: Elderly, NAD Neck: Negative for carotid bruits. No JVD Lungs:Clear to ausculation bilaterally. No wheezes, rales, or rhonchi. Breathing is unlabored. Cardiovascular: RRR with S1 S2. No murmurs Extremities: No edema. No clubbing or cyanosis. DP pulses 2+ bilaterally Neuro: Alert and oriented. No focal deficits. No facial asymmetry. MAE spontaneously. Psych: Responds to questions appropriately with normal affect.     EKG:  EKG is ordered today. The ekg ordered today demonstrates NSR, HR 81 bpm with no acute changes.   Recent Labs: 01/04/2019: ALT 14; BUN 33; Creatinine, Ser 1.29; Hemoglobin 13.3; Platelets 167; Potassium 4.4;  Sodium 140    Lipid Panel    Component Value Date/Time   CHOL 160 01/04/2019 1134   TRIG 228 (H) 01/04/2019 1134   HDL 44 01/04/2019 1134   CHOLHDL 3.6 01/04/2019 1134   CHOLHDL 3.6 03/18/2017 1343   VLDL 41 (H) 12/08/2016 0755   LDLCALC 90 07/08/2018 1030   LDLCALC 84 03/18/2017 1343      Wt Readings from Last 3 Encounters:  01/11/19 156 lb 3.2 oz (70.9 kg)  01/04/19 156 lb 12.8 oz (71.1 kg)  07/04/18 155 lb 3.2 oz (70.4 kg)    Other studies Reviewed: Additional studies/ records that were reviewed today include:   Cardiac telemetry monitoring 11/24/2016:  Normal sinus rhythm with average HR  88 bpm  Paroxysmal atrial fibrillation with RVR, present 19% of the time   Echocardiogram 06/26/2015: Study Conclusions  - Left ventricle: The cavity size was normal. Wall thickness was   increased in a pattern of mild LVH. Systolic function was normal.   The estimated ejection fraction was in the range of 50% to 55%.   Wall motion was normal; there were no regional wall motion   abnormalities. - Aortic valve: Trileaflet; mildly thickened, mildly calcified   leaflets. There was mild regurgitation. - Mitral valve: Calcified annulus. There was mild regurgitation. - Pericardium, extracardiac: A trivial pericardial effusion was   identified posterior to the heart.  ASSESSMENT AND PLAN:  1.  Atrial fibrillation with RVR: -Stable, EKG today shows NSR however patient reports 2 episodes of recent possible atrial fibrillation in which her BP was low and her home BP monitor reported AF.  This is concerning given her overall stability in the last several years -We will place ZIO monitor to confirm and see her back in close follow-up -May need increase of diltiazem at that time based on monitor results -Anticoagulation with Xarelto, no complaints of acute bleeding in stool or urine -Continue diltiazem 120 mg daily -CBC from 01/04/2019, hemoglobin 13.3 CHA2DS2VASc =4 (age, female, HTN)    2.  Essential hypertension: -Elevated today, 142/80 however patient has had a few low BP readings of her extensive BP log therefore we will keep diltiazem at current dose.  If more episodes of AF may need to increase diltiazem to 240 -HCTZ 20/12.5 was added to her regimen at last office visit for elevated pressure 156/72 -Creatinine, 1.29 on 01/04/2019 which appears to be at her baseline   3.  Chronic anticoagulation: -Anticoagulated with Xarelto 20 mg daily   Current medicines are reviewed at length with the patient today.  The patient does not have concerns regarding medicines.  The following changes have been made:  no change  Labs/ tests ordered today include: 2-week monitor  Orders Placed This Encounter  Procedures  . LONG TERM MONITOR-LIVE TELEMETRY (3-14 DAYS)  . EKG 12-Lead    Disposition:   FU with Dr. Tamala Julian or app after monitor results   Signed, Kathyrn Drown, NP  01/11/2019 10:31 AM    [address], [place], [place]  [address] Phone: [phone]; Fax: [phone]

## 2019-01-04 NOTE — Patient Instructions (Addendum)
Continue your current medications. Bring your blood pressure list to show the cardiologist.  Also bring your monitor so they can verify the accuracy.  Be sure to drink more water, and drink it earlier in the day--your urine should be pale yellow.  Try and drink your water earlier in the day, limiting how much you drink with/after dinner.  If your mouth is dry, try the spray (instead of taking sips of water at night).  Dry mouth can due to your medications, but also due to mouth-breathing if you are congested in your nose.  You can use claritin or other antihistamine if needed for allergies, as well as nasal saline spray.   Low-Purine Eating Plan A low-purine eating plan involves making food choices to limit your intake of purine. Purine is a kind of uric acid. Too much uric acid in your blood can cause certain conditions, such as gout and kidney stones. Eating a low-purine diet can help control these conditions. What are tips for following this plan? Reading food labels   Avoid foods with saturated or Trans fat.  Check the ingredient list of grains-based foods, such as bread and cereal, to make sure that they contain whole grains.  Check the ingredient list of sauces or soups to make sure they do not contain meat or fish.  When choosing soft drinks, check the ingredient list to make sure they do not contain high-fructose corn syrup. Shopping  Buy plenty of fresh fruits and vegetables.  Avoid buying canned or fresh fish.  Buy dairy products labeled as low-fat or nonfat.  Avoid buying premade or processed foods. These foods are often high in fat, salt (sodium), and added sugar. Cooking  Use olive oil instead of butter when cooking. Oils like olive oil, canola oil, and sunflower oil contain healthy fats. Meal planning  Learn which foods do or do not affect you. If you find out that a food tends to cause your gout symptoms to flare up, avoid eating that food. You can enjoy foods that do  not cause problems. If you have any questions about a food item, talk with your dietitian or health care provider.  Limit foods high in fat, especially saturated fat. Fat makes it harder for your body to get rid of uric acid.  Choose foods that are lower in fat and are lean sources of protein. General guidelines  Limit alcohol intake to no more than 1 drink a day for nonpregnant women and 2 drinks a day for men. One drink equals 12 oz of beer, 5 oz of wine, or 1 oz of hard liquor. Alcohol can affect the way your body gets rid of uric acid.  Drink plenty of water to keep your urine clear or pale yellow. Fluids can help remove uric acid from your body.  If directed by your health care provider, take a vitamin C supplement.  Work with your health care provider and dietitian to develop a plan to achieve or maintain a healthy weight. Losing weight can help reduce uric acid in your blood. What foods are recommended? The items listed may not be a complete list. Talk with your dietitian about what dietary choices are best for you.  Foods low in purines Foods low in purines do not need to be limited. These include:  All fruits.  All low-purine vegetables, pickles, and olives.  Breads, pasta, rice, cornbread, and popcorn. Cake and other baked goods.  All dairy foods.  Eggs, nuts, and nut butters.  Spices  and condiments, such as salt, herbs, and vinegar.  Plant oils, butter, and margarine.  Water, sugar-free soft drinks, tea, coffee, and cocoa.  Vegetable-based soups, broths, sauces, and gravies.   Foods moderate in purines Foods moderate in purines should be limited to the amounts listed.   cup of asparagus, cauliflower, spinach, mushrooms, or green peas, each day.  2/3 cup uncooked oatmeal, each day.   cup dry wheat bran or wheat germ, each day.  2-3 ounces of meat or poultry, each day.  4-6 ounces of shellfish, such as crab, lobster, oysters, or shrimp, each day.  1 cup  cooked beans, peas, or lentils, each day. Soup, broths, or bouillon made from meat or fish. Limit these foods as much as possible.  What foods are not recommended? The items listed may not be a complete list. Talk with your dietitian about what dietary choices are best for you. Limit your intake of foods high in purines, including:  Beer and other alcohol.  Meat-based gravy or sauce.  Canned or fresh fish, such as: ? Anchovies, sardines, herring, and tuna. ? Mussels and scallops. ? Codfish, trout, and haddock.  Berniece Salines.  Organ meats, such as: ? Liver or kidney. ? Tripe. ? Sweetbreads (thymus gland or pancreas).  Wild [employer] biochemist.  Yeast or yeast extract supplements.  Drinks sweetened with high-fructose corn syrup.   Summary  Eating a low-purine diet can help control conditions caused by too much uric acid in the body, such as gout or kidney stones.  Choose low-purine foods, limit alcohol, and limit foods high in fat.  You will learn over time which foods do or do not affect you. If you find out that a food tends to cause your gout symptoms to flare up, avoid eating that food. This information is not intended to replace advice given to you by your health care provider. Make sure you discuss any questions you have with your health care provider. Document Released: 08/15/2010 Document Revised: 04/02/2017 Document Reviewed: 06/03/2016 Elsevier Patient Education  2020 [employer].

## 2019-01-05 ENCOUNTER — Encounter: Payer: Self-pay | Admitting: Family Medicine

## 2019-01-05 LAB — CBC WITH DIFFERENTIAL/PLATELET
Basophils Absolute: 0 10*3/uL (ref 0.0–0.2)
Basos: 1 %
EOS (ABSOLUTE): 0.2 10*3/uL (ref 0.0–0.4)
Eos: 3 %
Hematocrit: 38.5 % (ref 34.0–46.6)
Hemoglobin: 13.3 g/dL (ref 11.1–15.9)
Immature Grans (Abs): 0 10*3/uL (ref 0.0–0.1)
Immature Granulocytes: 0 %
Lymphocytes Absolute: 1.5 10*3/uL (ref 0.7–3.1)
Lymphs: 26 %
MCH: 31.7 pg (ref 26.6–33.0)
MCHC: 34.5 g/dL (ref 31.5–35.7)
MCV: 92 fL (ref 79–97)
Monocytes Absolute: 0.4 10*3/uL (ref 0.1–0.9)
Monocytes: 7 %
Neutrophils Absolute: 3.7 10*3/uL (ref 1.4–7.0)
Neutrophils: 63 %
Platelets: 167 10*3/uL (ref 150–450)
RBC: 4.2 x10E6/uL (ref 3.77–5.28)
RDW: 12.7 % (ref 11.7–15.4)
WBC: 5.9 10*3/uL (ref 3.4–10.8)

## 2019-01-05 LAB — COMPREHENSIVE METABOLIC PANEL
ALT: 14 IU/L (ref 0–32)
AST: 21 IU/L (ref 0–40)
Albumin/Globulin Ratio: 1.8 (ref 1.2–2.2)
Albumin: 4.2 g/dL (ref 3.6–4.6)
Alkaline Phosphatase: 76 IU/L (ref 39–117)
BUN/Creatinine Ratio: 26 (ref 12–28)
BUN: 33 mg/dL — ABNORMAL HIGH (ref 8–27)
Bilirubin Total: 0.4 mg/dL (ref 0.0–1.2)
CO2: 23 mmol/L (ref 20–29)
Calcium: 10.3 mg/dL (ref 8.7–10.3)
Chloride: 102 mmol/L (ref 96–106)
Creatinine, Ser: 1.29 mg/dL — ABNORMAL HIGH (ref 0.57–1.00)
GFR calc Af Amer: 42 mL/min/{1.73_m2} — ABNORMAL LOW (ref >59)
GFR calc non Af Amer: 37 mL/min/{1.73_m2} — ABNORMAL LOW (ref >59)
Globulin, Total: 2.3 g/dL (ref 1.5–4.5)
Glucose: 105 mg/dL — ABNORMAL HIGH (ref 65–99)
Potassium: 4.4 mmol/L (ref 3.5–5.2)
Sodium: 140 mmol/L (ref 134–144)
Total Protein: 6.5 g/dL (ref 6.0–8.5)

## 2019-01-05 LAB — LIPID PANEL
Chol/HDL Ratio: 3.6 ratio (ref 0.0–4.4)
Cholesterol, Total: 160 mg/dL (ref 100–199)
HDL: 44 mg/dL (ref >39)
LDL Chol Calc (NIH): 78 mg/dL (ref 0–99)
Triglycerides: 228 mg/dL — ABNORMAL HIGH (ref 0–149)
VLDL Cholesterol Cal: 38 mg/dL (ref 5–40)

## 2019-01-05 LAB — URIC ACID: Uric Acid: 5.1 mg/dL (ref 2.5–7.1)

## 2019-01-05 MED ORDER — PRAVASTATIN SODIUM 40 MG PO TABS: 40.0000 mg | ORAL_TABLET | Freq: Every day | ORAL | 1 refills | Status: DC

## 2019-01-11 ENCOUNTER — Encounter: Payer: Self-pay | Admitting: Cardiology

## 2019-01-11 ENCOUNTER — Telehealth: Payer: Self-pay

## 2019-01-11 ENCOUNTER — Other Ambulatory Visit: Payer: Self-pay

## 2019-01-11 ENCOUNTER — Ambulatory Visit: Payer: Medicare HMO | Admitting: Cardiology

## 2019-01-11 VITALS — BP 142/80 | HR 81 | Ht 60.0 in | Wt 156.2 lb

## 2019-01-11 DIAGNOSIS — I4891 Unspecified atrial fibrillation: Secondary | ICD-10-CM | POA: Diagnosis not present

## 2019-01-11 DIAGNOSIS — Z7901 Long term (current) use of anticoagulants: Secondary | ICD-10-CM

## 2019-01-11 DIAGNOSIS — I10 Essential (primary) hypertension: Secondary | ICD-10-CM | POA: Diagnosis not present

## 2019-01-11 NOTE — Telephone Encounter (Signed)
The patient has been notified of the result and verbalized understanding.  All questions (if any) were answered. Wilma Flavin, RN 01/11/2019 1:20 PM

## 2019-01-11 NOTE — Patient Instructions (Signed)
Medication Instructions:   Your physician recommends that you continue on your current medications as directed. Please refer to the Current Medication list given to you today.  If you need a refill on your cardiac medications before your next appointment, please call your pharmacy.   Lab work:  None ordered today  If you have labs (blood work) drawn today and your tests are completely normal, you will receive your results only by: Marland Kitchen MyChart Message (if you have MyChart) OR . A paper copy in the mail If you have any lab test that is abnormal or we need to change your treatment, we will call you to review the results.  Testing/Procedures:  Your physician has recommended that you wear an event monitor. Event monitors are medical devices that record the heart's electrical activity. Doctors most often [place] these monitors to diagnose arrhythmias. Arrhythmias are problems with the speed or rhythm of the heartbeat. The monitor is a small, portable device. You can wear one while you do your normal daily activities. This is usually used to diagnose what is causing palpitations/syncope (passing out).  Follow-Up:  After you monitor results come back.

## 2019-01-12 ENCOUNTER — Other Ambulatory Visit: Payer: Self-pay | Admitting: Interventional Cardiology

## 2019-01-17 ENCOUNTER — Telehealth: Payer: Self-pay | Admitting: *Deleted

## 2019-01-17 NOTE — Telephone Encounter (Signed)
14 day ZIO AT long term live telemetry monitor to be mailed to patients home.  Patient will be going out of town starting Sunday, 01/22/19, for one week.  [employer] representative [employer], will be notified to expedite mailing.  Instructions reviewed briefly with patient as they will be included in the monitor kit.

## 2019-01-21 ENCOUNTER — Ambulatory Visit (INDEPENDENT_AMBULATORY_CARE_PROVIDER_SITE_OTHER): Payer: Medicare HMO

## 2019-01-21 DIAGNOSIS — I4891 Unspecified atrial fibrillation: Secondary | ICD-10-CM | POA: Diagnosis not present

## 2019-01-30 ENCOUNTER — Telehealth: Payer: Self-pay | Admitting: Interventional Cardiology

## 2019-01-30 NOTE — Telephone Encounter (Signed)
New Message     Ramvir from [facility] calling in to report an abnormal EKG for patient wearing Zio patch.  Please give them a call back ref# [id number]

## 2019-01-30 NOTE — Telephone Encounter (Signed)
Spoke with  I rhytm and ZIO patch shows afib at rate of 102-143 for approx 90 sec on 01/30/19 at 8:43 EST.Spoke with pt's husband and pt was probably showering during this episode no mention of symptoms .Informed pt's husband to have pt  call back if has any questions. Otherwise will forward to Dr Tamala Julian for review and recommendations ./cy

## 2019-01-31 ENCOUNTER — Other Ambulatory Visit: Payer: Self-pay | Admitting: Family Medicine

## 2019-01-31 DIAGNOSIS — Z1231 Encounter for screening mammogram for malignant neoplasm of breast: Secondary | ICD-10-CM

## 2019-01-31 NOTE — Telephone Encounter (Signed)
Noted. No changes necessary.

## 2019-01-31 NOTE — Telephone Encounter (Signed)
PT AWARE DR Tamala Julian REVIEWED MESSAGE AND NO CHANGES ARE NECESSARY AT THIS TIME .Adonis Housekeeper

## 2019-03-03 IMAGING — MG DIGITAL SCREENING UNILATERAL LEFT MAMMOGRAM WITH CAD AND TOMO
6 series · 6 of 18 positions shown · non-contrast
Comparison: Previous exam(s).

CLINICAL DATA: Screening.

EXAM:
DIGITAL SCREENING UNILATERAL LEFT MAMMOGRAM WITH CAD AND TOMO

[L CC synth-2D (1 of 2)]
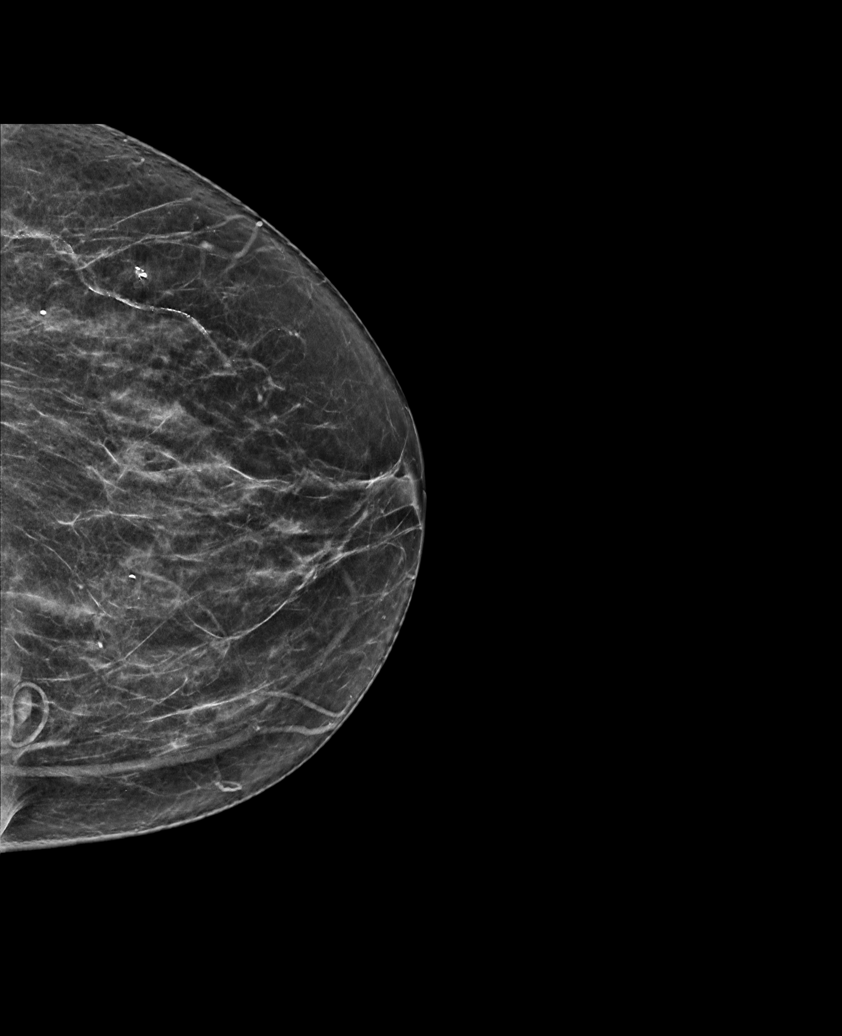

[L CC synth-2D (2 of 2)]
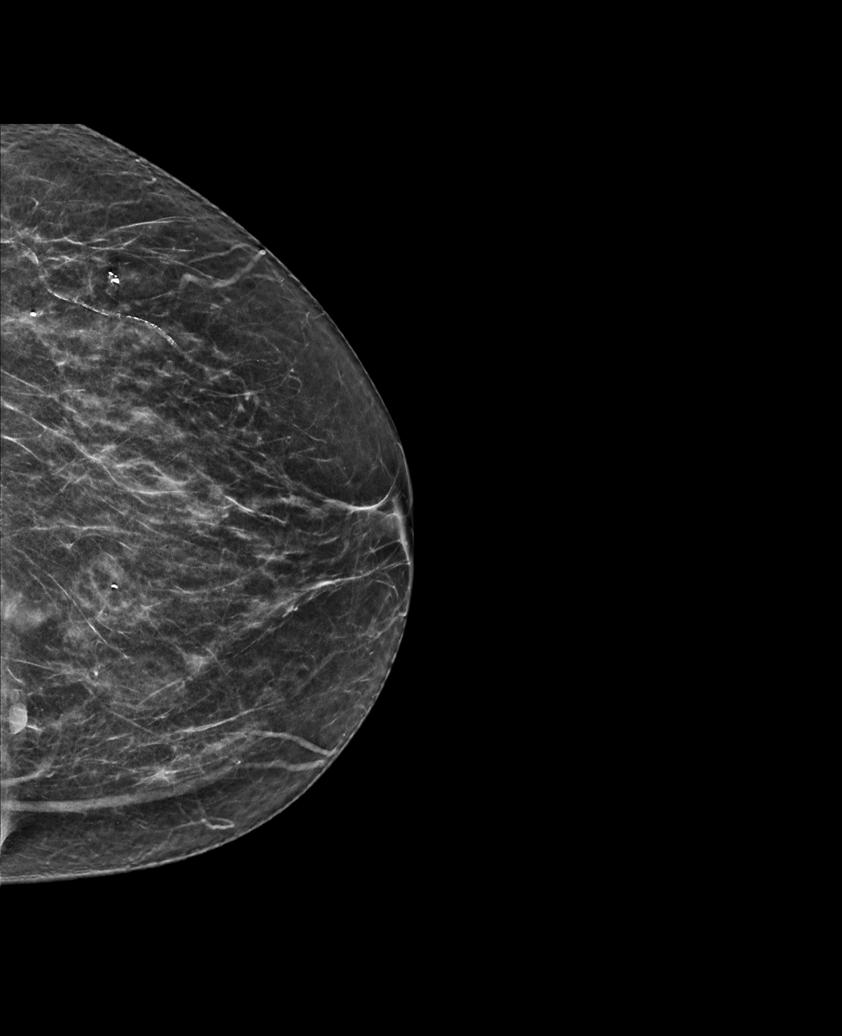

[L MLO synth-2D]
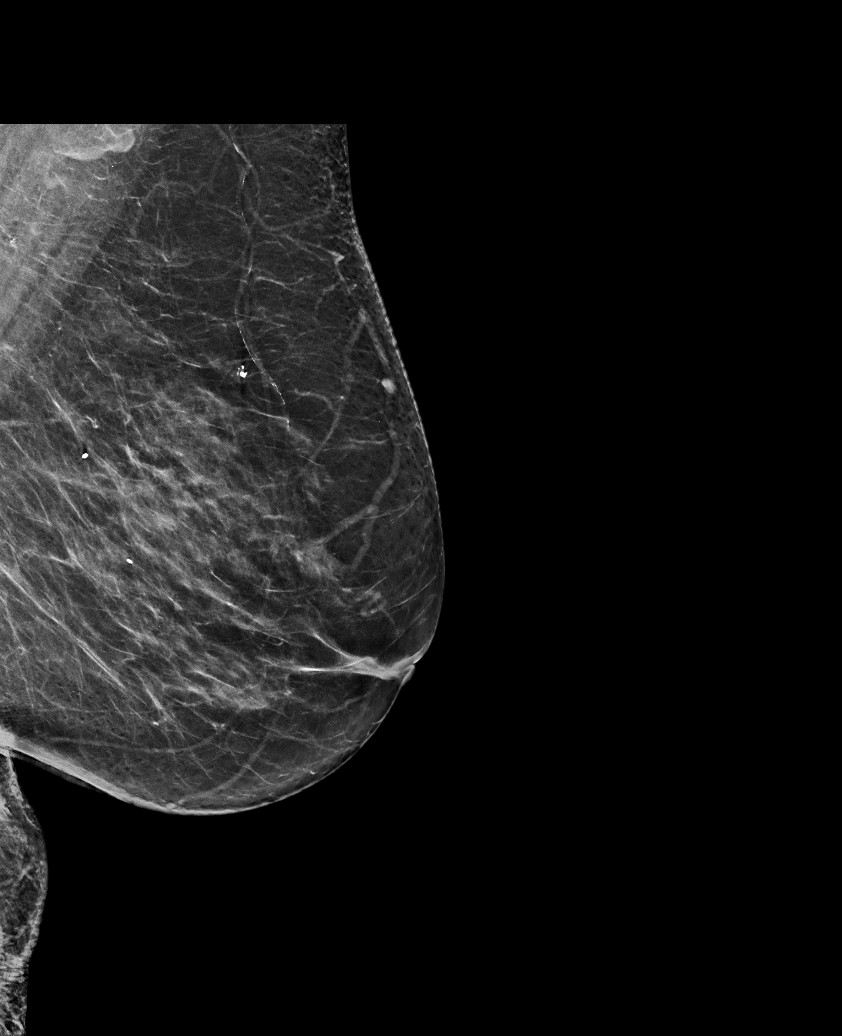

[L CC tomo (1 of 2) · tomo slice 35/68.0]
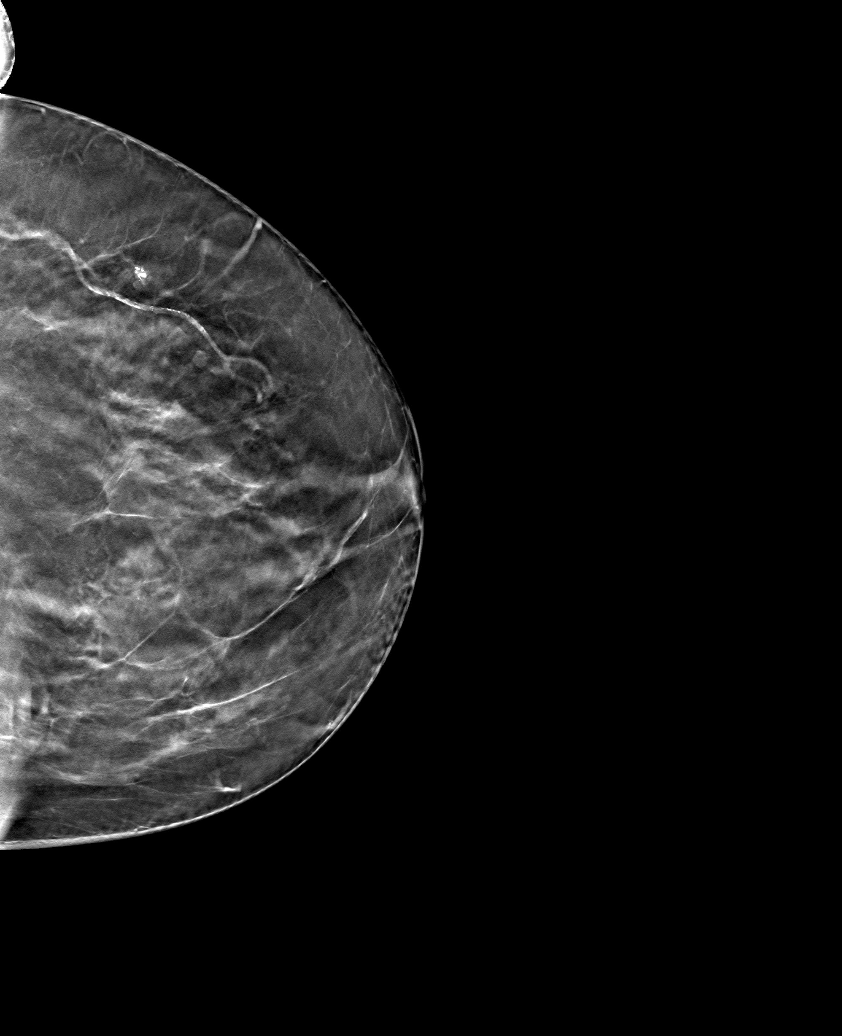

[L MLO tomo · tomo slice 37/73.0]
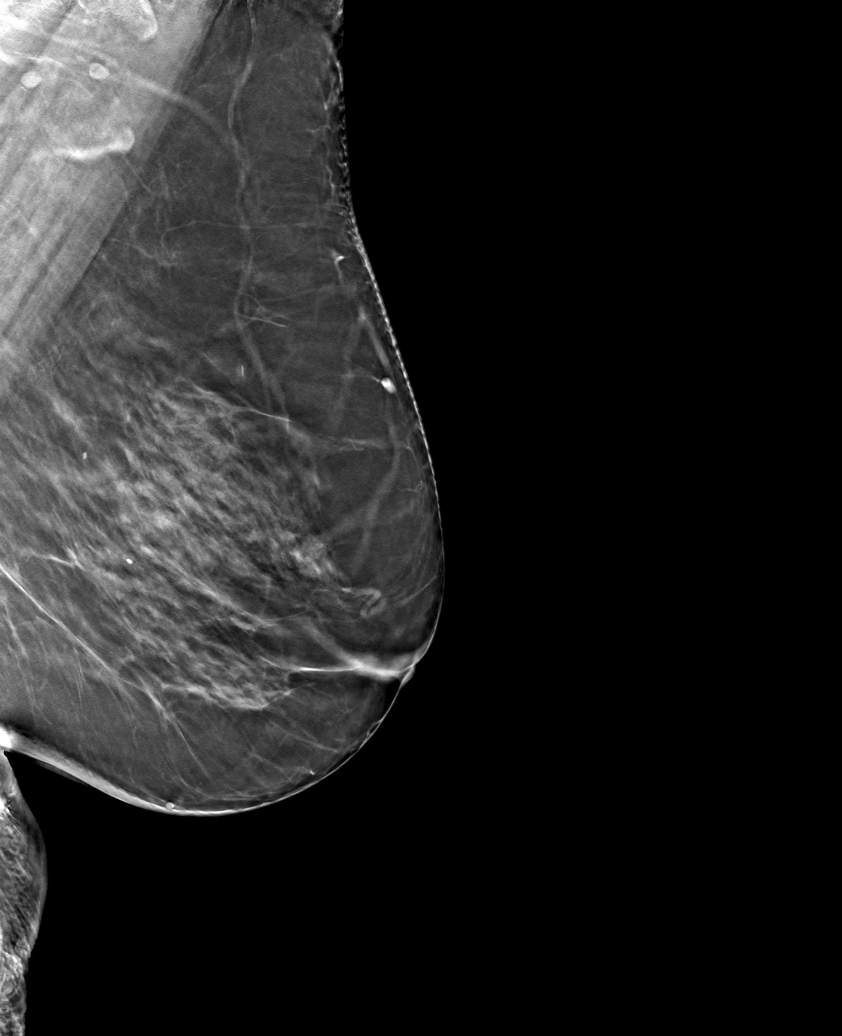

[L CC tomo (2 of 2) · tomo slice 31/62.0]
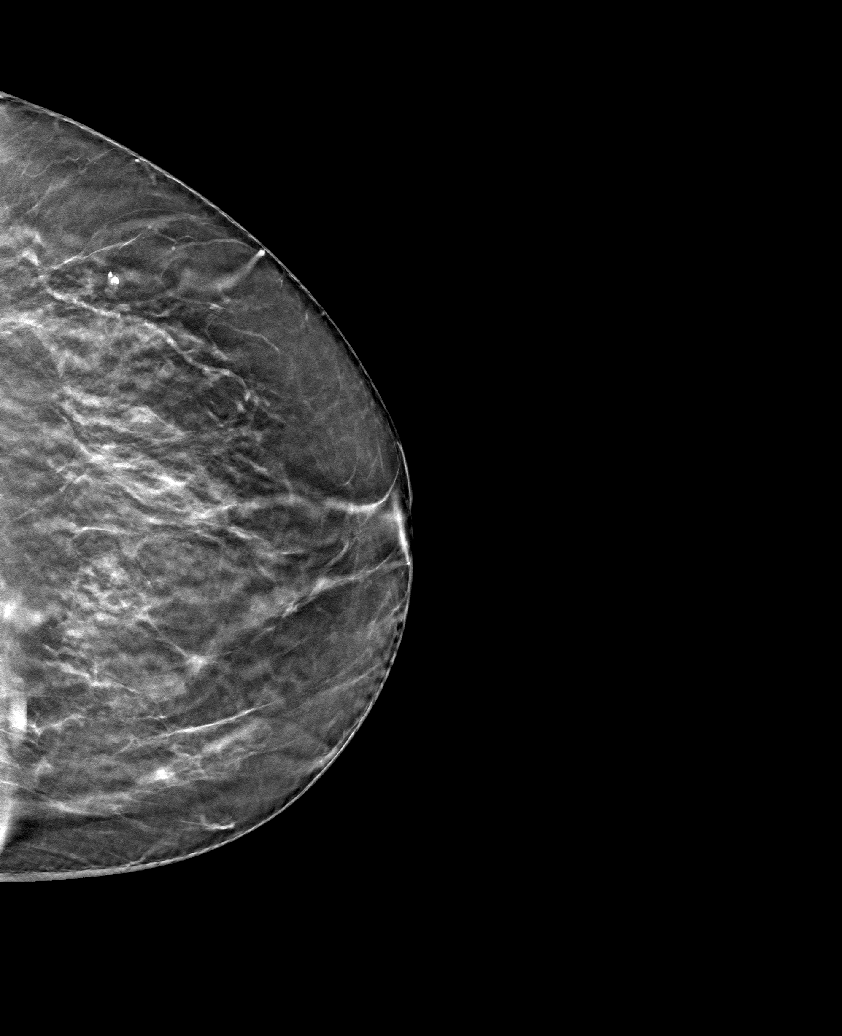

[6 of 18 positions shown; findings below may reference images not displayed]

ACR Breast Density Category c: The breast tissue is heterogeneously
dense, which may obscure small masses.
FINDINGS: The patient has had a right mastectomy. There are no findings
suspicious for malignancy.

Images were processed with CAD.
IMPRESSION: No mammographic evidence of malignancy. A result letter of this
screening mammogram will be mailed directly to the patient.

RECOMMENDATION:
Screening mammogram in one year.  (Code:SI-6-R6I)

BI-RADS CATEGORY  1: Negative.

## 2019-03-20 ENCOUNTER — Other Ambulatory Visit: Payer: Self-pay | Admitting: Family Medicine

## 2019-03-21 ENCOUNTER — Ambulatory Visit
Admission: RE | Admit: 2019-03-21 | Discharge: 2019-03-21 | Disposition: A | Discharge status: Home / Self Care | Payer: Medicare HMO | Source: Ambulatory Visit | Attending: Family Medicine | Admitting: Family Medicine

## 2019-03-21 ENCOUNTER — Other Ambulatory Visit: Payer: Self-pay

## 2019-03-21 DIAGNOSIS — Z1231 Encounter for screening mammogram for malignant neoplasm of breast: Secondary | ICD-10-CM | POA: Diagnosis not present

## 2019-03-27 ENCOUNTER — Other Ambulatory Visit: Payer: Self-pay | Admitting: Family Medicine

## 2019-03-28 ENCOUNTER — Ambulatory Visit: Payer: Medicare HMO | Admitting: Sports Medicine

## 2019-03-28 ENCOUNTER — Other Ambulatory Visit: Payer: Self-pay

## 2019-03-28 ENCOUNTER — Encounter: Payer: Self-pay | Admitting: Sports Medicine

## 2019-03-28 DIAGNOSIS — Z7901 Long term (current) use of anticoagulants: Secondary | ICD-10-CM

## 2019-03-28 DIAGNOSIS — M79674 Pain in right toe(s): Secondary | ICD-10-CM

## 2019-03-28 DIAGNOSIS — L84 Corns and callosities: Secondary | ICD-10-CM

## 2019-03-28 DIAGNOSIS — B351 Tinea unguium: Secondary | ICD-10-CM

## 2019-03-28 DIAGNOSIS — M79675 Pain in left toe(s): Secondary | ICD-10-CM

## 2019-03-28 DIAGNOSIS — M79672 Pain in left foot: Secondary | ICD-10-CM

## 2019-03-28 DIAGNOSIS — M79671 Pain in right foot: Secondary | ICD-10-CM | POA: Diagnosis not present

## 2019-03-28 NOTE — Progress Notes (Signed)
Subjective: Connie [name] is a 85 y.o. female patient seen today in office with complaint of painful thickened and elongated toenails and callus; unable to trim. Patient denies changes with medical history; still on Xalerto for Afib like before. Patient has no other pedal complaints at this time.   Patient Active Problem List   Diagnosis Date Noted  . Chronic anticoagulation 05/11/2016  . Atrial fibrillation with rapid ventricular response ([place]) 06/24/2015  . Gout 10/30/2013  . Gout of big toe 10/30/2013  . Osteoarthritis of multiple joints 10/30/2013  . Breast cancer, right breast ([place]) 11/25/2012  . Urge incontinence of urine 04/24/2011  . OA (osteoarthritis) 04/24/2011  . Essential hypertension, benign 10/23/2010  . Pure hypercholesterolemia 10/23/2010    Current Outpatient Medications on File Prior to Visit  Medication Sig Dispense Refill  . acetaminophen (TYLENOL) 650 MG CR tablet Take 1,300 mg by mouth 2 (two) times daily.    Marland Kitchen allopurinol (ZYLOPRIM) 100 MG tablet TAKE TWO TABLETS BY MOUTH DAILY 180 tablet 0  . calcium carbonate (TUMS - DOSED IN MG ELEMENTAL CALCIUM) 500 MG chewable tablet Chew 1 tablet by mouth daily as needed for indigestion or heartburn. Reported on 07/10/2015    . Calcium Carbonate-Vitamin D (CALTRATE 600+D) 600-400 MG-UNIT per tablet Take 1 tablet by mouth daily. Takes one in the am everyday and Monday,wednesday and Friday take one at night also    . CARTIA XT 120 MG 24 hr capsule TAKE ONE CAPSULE BY MOUTH DAILY 90 capsule 3  . colchicine 0.6 MG tablet Use as directed. Take two tablets by mouth at start of gout flare up. Then one hour later take one tablet by mouth.    Marland Kitchen lisinopril-hydrochlorothiazide (ZESTORETIC) 20-12.5 MG tablet TAKE ONE TABLET BY MOUTH DAILY 90 tablet 2  . Multiple Vitamins-Minerals (CENTRUM SILVER PO) Take 1 tablet by mouth daily.      Marland Kitchen omeprazole (PRILOSEC) 20 MG capsule Take 1 capsule (20 mg total) by mouth daily. Reported on  05/23/2015    . pravastatin (PRAVACHOL) 40 MG tablet Take 1 tablet (40 mg total) by mouth daily. 90 tablet 1  . tolterodine (DETROL LA) 4 MG 24 hr capsule TAKE ONE CAPSULE BY MOUTH DAILY 90 capsule 0  . XARELTO 15 MG TABS tablet TAKE ONE TABLET BY MOUTH DAILY WITH SUPPER. 90 tablet 3   No current facility-administered medications on file prior to visit.     Allergies  Allergen Reactions  . Cephalexin Itching  . Ciprofloxacin Itching  . Diclofenac Nausea Only    Upset stomach  . Shellfish-Derived Products Nausea And Vomiting    Objective: Physical Exam  General: Well developed, nourished, no acute distress, awake, alert and oriented x 3  Vascular: Dorsalis pedis artery 1/4 bilateral, Posterior tibial artery 0/4 bilateral, skin temperature warm to warm proximal to distal bilateral lower extremities, trace edema at ankles bilateral, pedal hair present bilateral.  Neurological: Gross sensation present via light touch bilateral.   Dermatological: Skin is warm, dry, and supple bilateral, Nails 1-10 are tender, long, thick, and discolored with mild subungal debris, no webspace macerations present bilateral, no open lesions present bilateral, mild reactive keratosis medial aspect of right bunion greater than plantar left forefoot. No signs of infection bilateral.  Musculoskeletal: Asymptomatic bunion boney deformities noted bilateral. Muscular strength within normal limits without painon range of motion. No pain with calf compression bilateral.   Assessment and Plan:  Problem List Items Addressed This Visit    None    Visit  Diagnoses    Pain due to onychomycosis of toenails of both feet    -  Primary   Callus of foot       Foot pain, bilateral       Anticoagulant long-term use         -Examined patient.  -Re-discussed treatment options for painful mycotic nails  -Mechanically debrided and reduced mycotic nails with sterile nail nipper and dremel nail file without incident and at no  additional charge mechanically debrided calluses x1 using a sterile chisel blade without incident -Continue with good supportive shoes for foot type -Patient to return in 2.5 to 3 months for follow up evaluation or sooner if symptoms worsen.  Landis Martins, DPM

## 2019-05-19 DIAGNOSIS — S8992XA Unspecified injury of left lower leg, initial encounter: Secondary | ICD-10-CM | POA: Diagnosis not present

## 2019-05-19 DIAGNOSIS — S7012XA Contusion of left thigh, initial encounter: Secondary | ICD-10-CM | POA: Diagnosis not present

## 2019-05-19 DIAGNOSIS — S8991XA Unspecified injury of right lower leg, initial encounter: Secondary | ICD-10-CM | POA: Diagnosis not present

## 2019-05-22 ENCOUNTER — Other Ambulatory Visit: Payer: Self-pay

## 2019-05-22 ENCOUNTER — Encounter: Payer: Self-pay | Admitting: Nurse Practitioner

## 2019-05-22 ENCOUNTER — Ambulatory Visit: Payer: Medicare HMO | Admitting: Nurse Practitioner

## 2019-05-22 ENCOUNTER — Telehealth: Payer: Self-pay | Admitting: Interventional Cardiology

## 2019-05-22 VITALS — BP 136/78 | HR 114 | Ht 60.0 in | Wt 160.0 lb

## 2019-05-22 DIAGNOSIS — Z7189 Other specified counseling: Secondary | ICD-10-CM | POA: Diagnosis not present

## 2019-05-22 DIAGNOSIS — E7800 Pure hypercholesterolemia, unspecified: Secondary | ICD-10-CM | POA: Diagnosis not present

## 2019-05-22 DIAGNOSIS — I4891 Unspecified atrial fibrillation: Secondary | ICD-10-CM

## 2019-05-22 DIAGNOSIS — Z79899 Other long term (current) drug therapy: Secondary | ICD-10-CM

## 2019-05-22 DIAGNOSIS — I10 Essential (primary) hypertension: Secondary | ICD-10-CM | POA: Diagnosis not present

## 2019-05-22 DIAGNOSIS — I480 Paroxysmal atrial fibrillation: Secondary | ICD-10-CM | POA: Diagnosis not present

## 2019-05-22 DIAGNOSIS — Z7901 Long term (current) use of anticoagulants: Secondary | ICD-10-CM | POA: Diagnosis not present

## 2019-05-22 MED ORDER — DILTIAZEM HCL ER COATED BEADS 120 MG PO CP24: 240.0000 mg | ORAL_CAPSULE | Freq: Every day | ORAL | 3 refills | Status: DC

## 2019-05-22 MED ORDER — HYDROCHLOROTHIAZIDE 25 MG PO TABS: 25.0000 mg | ORAL_TABLET | Freq: Every day | ORAL | 3 refills | Status: DC

## 2019-05-22 NOTE — Patient Instructions (Addendum)
After Visit Summary:  We will be checking the following labs today - BMET, CBC, TSH and Mag level   Medication Instructions:    Continue with your current medicines. BUT  Increase the Cartia to 2 pills a day - we will do this for the next week - start this today.   STOP Lisinopril HCTZ  START HCTZ 25 mg a day - this is at your pharmacy.    If you need a refill on your cardiac medications before your next appointment, please call your pharmacy.     Testing/Procedures To Be Arranged:  N/A  Follow-Up:   See me or Dr. Tamala Julian in about a week with repeat EKG    At [facility], you and your health needs are our priority.  As part of our continuing mission to provide you with exceptional heart care, we have created designated Provider Care Teams.  These Care Teams include your primary Cardiologist (physician) and Advanced Practice Providers (APPs -  Physician Assistants and Nurse Practitioners) who all work together to provide you with the care you need, when you need it.  Special Instructions:  . Stay safe, stay home, wash your hands for at least 20 seconds and wear a mask when out in public.  . It was good to talk with you both today.    Call the [place] office at [phone] if you have any questions, problems or concerns.

## 2019-05-22 NOTE — Progress Notes (Signed)
CARDIOLOGY OFFICE NOTE  Date:  05/22/2019    Attala Date of Birth: [birth year] Medical Record #[id number]  PCP:  Rita Ohara, MD  Cardiologist:  Tamala Julian    Chief Complaint  Patient presents with  . Atrial Fibrillation    Work in visit - seen for Dr. Tamala Julian    History of Present Illness: Connie [name] is a 86 y.o. female who presents today for a work in visit. Seen for Dr. Tamala Julian.   She has a history of PAF, HTN and HLD. She is on anticoagulation. Prior monitor from 2018 showed 19% burden.   Last seen by Dr. Tamala Julian in 01/2018 - doing ok - BP was up - HCTZ was added.   Seen this past September 2020 by Sharee Pimple - had had some recurrent AF reported - monitor was updated - anticoagulation was continued.   Phone call today -    Spoke with pt and she states she has been in Afib x 2 days.  This is only the second time this has happened.  Last time was two weeks ago.  Usually goes into Afib for a couple of hours and then converts.  Complains of dizziness.  Had episodes where she had to sit down quickly because she thought she was going to pass out. HR has been 103-113.  Pt did not want to go to hospital. Scheduled pt to see Truitt Merle, NP today at 1:30pm.  Pt appreciative for call.        The patient does not have symptoms concerning for COVID-19 infection (fever, chills, cough, or new shortness of breath).   Comes in today. Here with her husband. She is in a wheelchair. She has had a spell of AF 2 weeks ago - lasted longer than normal and now this spell that started yesterday and has not converted back. She has felt dizzy and pre syncopal over the past 24 hours. She has not passed out. She fell in the grocery store - not related - last week - some abrasions noted and she is in a wheelchair today. Little more swelling but typically does not have and feels like this is from the fall. No chest pain. She remains on her Xarelto. She is wondering if her medicines need to be  adjusted. She does not wish to go the hospital.   Last echo from 2017 with LAE at upper limits of normal.   Past Medical History:  Diagnosis Date  . Abnormal brain MRI 8/01   small hemorrhagic stroke and possible cavernous hemangioma (Dr. Erling Cruz)  . Adenomatous colon polyp 1/03  . Arthritis    OA spine, hands  . Breast cancer ([place]) 2001   R breast (T3N1, ER/PR+, HER-2 +) s/p mastectomy, chemo and chest wall irradiation (Dr. Truddie Coco)  . DDD (degenerative disc disease), lumbar   . GERD (gastroesophageal reflux disease)   . Hearing loss 2012   wears hearing aids  . Herpes zoster 2003  . Hyperlipidemia   . Hypertension   . Onychomycosis 2003, 2008   treated with Lamisil  . Osteopenia    (DEXA's done by Dr. Truddie Coco); prev took Fosamax.  No change in DEXA after off Fosamax x 2 years  . Ovarian cyst 12/2008   right  . Personal history of chemotherapy   . Personal history of radiation therapy   . Postmenopausal bleeding 2000   benign EMB (Dr. Raphael Gibney)  . Urge urinary incontinence     Past Surgical History:  Procedure  Laterality Date  . CATARACT EXTRACTION  summer 2011   bilateral  . CHOLECYSTECTOMY, LAPAROSCOPIC  1/05  . COLONOSCOPY  12/17/08   normal (Dr. Wynetta Emery)  . COLONOSCOPY W/ POLYPECTOMY  05/23/01   adenomatous polyp  . MASTECTOMY Right 2001  . modified mastectomy and tram flap reconstruction  2001   R breast  . TONSILLECTOMY    . tram flap removal     problems with flap after radiation  . TUBAL LIGATION       Medications: Current Meds  Medication Sig  . acetaminophen (TYLENOL) 650 MG CR tablet Take 1,300 mg by mouth 2 (two) times daily.  Marland Kitchen allopurinol (ZYLOPRIM) 100 MG tablet TAKE TWO TABLETS BY MOUTH DAILY  . calcium carbonate (TUMS - DOSED IN MG ELEMENTAL CALCIUM) 500 MG chewable tablet Chew 1 tablet by mouth daily as needed for indigestion or heartburn. Reported on 07/10/2015  . Calcium Carbonate-Vitamin D (CALTRATE 600+D) 600-400 MG-UNIT per tablet Take 1 tablet by  mouth daily. Takes one in the am everyday and Monday,wednesday and Friday take one at night also  . colchicine 0.6 MG tablet Use as directed. Take two tablets by mouth at start of gout flare up. Then one hour later take one tablet by mouth.  . diltiazem (CARTIA XT) 120 MG 24 hr capsule Take 2 capsules (240 mg total) by mouth daily.  . furosemide (LASIX) 20 MG tablet Take 20 mg by mouth as needed.  . Multiple Vitamins-Minerals (CENTRUM SILVER PO) Take 1 tablet by mouth daily.    Marland Kitchen omeprazole (PRILOSEC) 20 MG capsule Take 1 capsule (20 mg total) by mouth daily. Reported on 05/23/2015  . pravastatin (PRAVACHOL) 40 MG tablet Take 1 tablet (40 mg total) by mouth daily.  Marland Kitchen tolterodine (DETROL LA) 4 MG 24 hr capsule TAKE ONE CAPSULE BY MOUTH DAILY  . XARELTO 15 MG TABS tablet TAKE ONE TABLET BY MOUTH DAILY WITH SUPPER.  . [DISCONTINUED] CARTIA XT 120 MG 24 hr capsule TAKE ONE CAPSULE BY MOUTH DAILY  . [DISCONTINUED] lisinopril-hydrochlorothiazide (ZESTORETIC) 20-12.5 MG tablet TAKE ONE TABLET BY MOUTH DAILY     Allergies: Allergies  Allergen Reactions  . Cephalexin Itching  . Ciprofloxacin Itching  . Diclofenac Nausea Only    Upset stomach  . Shellfish-Derived Products Nausea And Vomiting    Social History: The patient  reports that she has never smoked. She has never used smokeless tobacco. She reports that she does not drink alcohol or use drugs.   Family History: The patient's family history includes Diabetes in her brother; Heart disease in her brother, father, and mother; Hypertension in her father and mother.   Review of Systems: Please see the history of present illness.   All other systems are reviewed and negative.   Physical Exam: VS:  BP 136/78   Pulse (!) 114   Ht 5' (1.524 m)   Wt 160 lb (72.6 kg)   BMI 31.25 kg/m  .  BMI Body mass index is 31.25 kg/m.  Wt Readings from Last 3 Encounters:  05/22/19 160 lb (72.6 kg)  01/11/19 156 lb 3.2 oz (70.9 kg)  01/04/19 156 lb  12.8 oz (71.1 kg)    General: Pleasant. Elderly and in no acute distress. She is in a wheelchair.   HEENT: Normal.  Neck: Supple, no JVD, carotid bruits, or masses noted.  Cardiac: Irregular irregular rhythm. Rate is a little fast. HR about 114 by me. Trace edema.  Respiratory:  Lungs are clear to auscultation bilaterally with  normal work of breathing.  GI: Soft and nontender.  MS: No deformity or atrophy. Gait not tested. She has abrasions over both shins - right > left. Skin is taunt. No overt infection.  Skin: Warm and dry. Color is normal.  Neuro:  Strength and sensation are intact and no gross focal deficits noted.  Psych: Alert, appropriate and with normal affect.   LABORATORY DATA:  EKG:  EKG is ordered today. This demonstrates AF with VR of 114 with one PVC noted.   Lab Results  Component Value Date   WBC 5.9 01/04/2019   HGB 13.3 01/04/2019   HCT 38.5 01/04/2019   PLT 167 01/04/2019   GLUCOSE 105 (H) 01/04/2019   CHOL 160 01/04/2019   TRIG 228 (H) 01/04/2019   HDL 44 01/04/2019   LDLCALC 78 01/04/2019   ALT 14 01/04/2019   AST 21 01/04/2019   NA 140 01/04/2019   K 4.4 01/04/2019   CL 102 01/04/2019   CREATININE 1.29 (H) 01/04/2019   BUN 33 (H) 01/04/2019   CO2 23 01/04/2019   TSH 2.790 07/01/2017     BNP (last 3 results) No results for input(s): BNP in the last 8760 hours.  ProBNP (last 3 results) No results for input(s): PROBNP in the last 8760 hours.   Other Studies Reviewed Today:  Zio Monitor Study Highlights 02/2019   Paroxysmal atrial fibrillation, total burden 2%. Longest episode greater than 5 hours.  7 beat wide-complex tachycardia  Predominant rhythm normal sinus with PACs and PVCs.  No sustained ventricular tachycardia.     Belva Crome, MD  02/24/2019 2:03 PM EDT    Let the patient know has paroxysmal AF. Needs to stay on anticoagulation therapy. A copy will be sent to Rita Ohara, MD    Cardiac telemetry monitoring  11/24/2016:  Normal sinus rhythm with average HR 88 bpm  Paroxysmal atrial fibrillation with RVR, present 19% of the time   Echocardiogram 06/26/2015: Study Conclusions  - Left ventricle: The cavity size was normal. Wall thickness was increased in a pattern of mild LVH. Systolic function was normal. The estimated ejection fraction was in the range of 50% to 55%. Wall motion was normal; there were no regional wall motion abnormalities. - Aortic valve: Trileaflet; mildly thickened, mildly calcified leaflets. There was mild regurgitation. - Mitral valve: Calcified annulus. There was mild regurgitation. - Pericardium, extracardiac: A trivial pericardial effusion was identified posterior to the heart.    ASSESSMENT AND PLAN:  1. AF with RVR - she is back in AF today - rate is not controlled - she remains on anticoagulation - I am increasing the Cartia to 240 mg a day - she will start this today since she has not had her dose for today. Check lab today as well. See back in one week. She does not wish to have cardioversion but could consider but would focus on rate control option.    2. HTN - BP typically soft with her AF - will stop her Lisinopril HCT and place only on HCTZ. Will follow.   3. Chronic anticoagulation - lab today - no problems noted - her fall last week was more of tripping over a cart that she did not see due to wearing a mask.   4. Advanced age  59. COVID-19 Education: The signs and symptoms of COVID-19 were discussed with the patient and how to seek care for testing (follow up with PCP or arrange E-visit).  The importance of social distancing, staying  at home, hand hygiene and wearing a mask when out in public were discussed today. They have had their first COVID vaccine.   Current medicines are reviewed with the patient today.  The patient does not have concerns regarding medicines other than what has been noted above.  The following changes have been  made:  See above.  Labs/ tests ordered today include:    Orders Placed This Encounter  Procedures  . Basic metabolic panel  . CBC  . TSH  . Magnesium  . EKG 12-Lead     Disposition:   FU with [place] in a week with repeat EKG. They asked if I would update their daughter - Dr. Theodoro Doing at [phone].   Patient is agreeable to this plan and will call if any problems develop in the interim.   SignedTruitt Merle, NP  05/22/2019 2:22 PM  [place] [address] [place], [place]  [address] Phone: [phone] Fax: [phone]

## 2019-05-22 NOTE — Telephone Encounter (Signed)
Per Daughter's call pt is experiencing extremely symptomatic Afib for the pass two days for extended periods of time and has to lay down to avid passing out.  Daughter stated pt's husband sent a Mychart message giving permission to speak two her.  Pt needs a call back now please.

## 2019-05-22 NOTE — Telephone Encounter (Signed)
Spoke with pt and she states she has been in Afib x 2 days.  This is only the second time this has happened.  Last time was two weeks ago.  Usually goes into Afib for a couple of hours and then converts.  Complains of dizziness.  Had episodes where she had to sit down quickly because she thought she was going to pass out. HR has been 103-113.  Pt did not want to go to hospital. Scheduled pt to see Truitt Merle, NP today at 1:30pm.  Pt appreciative for call.

## 2019-05-23 ENCOUNTER — Ambulatory Visit (INDEPENDENT_AMBULATORY_CARE_PROVIDER_SITE_OTHER): Payer: Medicare HMO | Admitting: Family Medicine

## 2019-05-23 ENCOUNTER — Encounter: Payer: Self-pay | Admitting: Family Medicine

## 2019-05-23 VITALS — BP 170/64 | HR 85 | Temp 97.3°F | Wt 160.8 lb

## 2019-05-23 DIAGNOSIS — S8010XA Contusion of unspecified lower leg, initial encounter: Secondary | ICD-10-CM

## 2019-05-23 LAB — MAGNESIUM: Magnesium: 2.3 mg/dL (ref 1.6–2.3)

## 2019-05-23 LAB — BASIC METABOLIC PANEL
BUN/Creatinine Ratio: 21 (ref 12–28)
BUN: 25 mg/dL (ref 8–27)
CO2: 25 mmol/L (ref 20–29)
Calcium: 9.6 mg/dL (ref 8.7–10.3)
Chloride: 100 mmol/L (ref 96–106)
Creatinine, Ser: 1.18 mg/dL — ABNORMAL HIGH (ref 0.57–1.00)
GFR calc Af Amer: 47 mL/min/{1.73_m2} — ABNORMAL LOW (ref >59)
GFR calc non Af Amer: 41 mL/min/{1.73_m2} — ABNORMAL LOW (ref >59)
Glucose: 96 mg/dL (ref 65–99)
Potassium: 4.2 mmol/L (ref 3.5–5.2)
Sodium: 139 mmol/L (ref 134–144)

## 2019-05-23 LAB — CBC
Hematocrit: 34.9 % (ref 34.0–46.6)
Hemoglobin: 11.8 g/dL (ref 11.1–15.9)
MCH: 31.3 pg (ref 26.6–33.0)
MCHC: 33.8 g/dL (ref 31.5–35.7)
MCV: 93 fL (ref 79–97)
Platelets: 200 10*3/uL (ref 150–450)
RBC: 3.77 x10E6/uL (ref 3.77–5.28)
RDW: 13 % (ref 11.7–15.4)
WBC: 7.9 10*3/uL (ref 3.4–10.8)

## 2019-05-23 LAB — TSH: TSH: 2.84 u[IU]/mL (ref 0.450–4.500)

## 2019-05-23 NOTE — Progress Notes (Signed)
   Subjective:    Patient ID: Connie [name], female    DOB: 18-Nov-1927, 86 y.o.   MRN: [id number]  HPI She is here for evaluation of multiple contusions to her legs.  She injured herself at the grocery store last Thursday.  She was seen in an urgent care center and x-rays were taken and were negative.  She is here for follow-up on that concerned about possible infection.   Review of Systems     Objective:   Physical Exam Alert and in no distress.  Contusions are noted on the anterior distal shins bilaterally, right greater than left.  Discoloration and swelling is noted with a small eschar on the right.  Negative Homans' sign.  It is not warm or tender.       Assessment & Plan:  Multiple leg contusions, unspecified laterality, initial encounter Heat for 20 minutes 3 times per day.  You can walk as much as you want and when you are sitting down keep your legs elevated If it gets hot red or tender ,call She will return here on Monday for a recheck.  There is a small eschar and I have concern over how well it will heal.

## 2019-05-23 NOTE — Patient Instructions (Signed)
Heat for 20 minutes 3 times per day.  You can walk as much as you want and when you are sitting down keep your legs elevated If it gets hot red or tender ,call

## 2019-05-25 NOTE — Progress Notes (Signed)
CARDIOLOGY OFFICE NOTE  Date:  05/30/2019    Connie [name] Date of Birth: April 28, 1928 Medical Record #[id number]  PCP:  Rita Ohara, MD  Cardiologist:  Jennings Books   Chief Complaint  Patient presents with  . Follow-up    Seen for Dr. Tamala Julian    History of Present Illness: Connie [name] is a 86 y.o. female who presents today for a one week check.  Seen for Dr. Tamala Julian.   She has a history of PAF, HTN and HLD. She is on anticoagulation. Prior monitor from 2018 showed 19% burden.   Last seen by Dr. Tamala Julian in 01/2018 - doing ok - BP was up - HCTZ was added. Seen this past September 2020 by Sharee Pimple - had had some recurrent AF reported - monitor was updated - anticoagulation was continued.   I saw her last week - as a work in - she was back in AF with RVR.  We elected to increase her CCB. She remains on anticoagulation. Little more swelling. BP tends to be low when she is in AF and I stopped her ACE.   Last echo from 2017 with LAE at upper limits of normal.   The patient does not have symptoms concerning for COVID-19 infection (fever, chills, cough, or new shortness of breath).   Comes in today. Here with her husband. She is not sure if she has been back in rhythm or not - HR still variable - fast here today. Not very short of breath. Still recovering from the fall she had had - still with abrasions on both lower legs - she is walking more in the apartment but not doing her 1 mile walks as she was previously. Both legs from the fall have swelling - she notes it tends to go down overnight. She has not used any prn lasix. No chest pain. BP is ok - not too soft.   To get 2nd COVID vaccine on Monday.   Past Medical History:  Diagnosis Date  . Abnormal brain MRI 8/01   small hemorrhagic stroke and possible cavernous hemangioma (Dr. Erling Cruz)  . Adenomatous colon polyp 1/03  . Arthritis    OA spine, hands  . Breast cancer ([place]) 2001   R breast (T3N1, ER/PR+, HER-2 +)  s/p mastectomy, chemo and chest wall irradiation (Dr. Truddie Coco)  . DDD (degenerative disc disease), lumbar   . GERD (gastroesophageal reflux disease)   . Hearing loss 2012   wears hearing aids  . Herpes zoster 2003  . Hyperlipidemia   . Hypertension   . Onychomycosis 2003, 2008   treated with Lamisil  . Osteopenia    (DEXA's done by Dr. Truddie Coco); prev took Fosamax.  No change in DEXA after off Fosamax x 2 years  . Ovarian cyst 12/2008   right  . Personal history of chemotherapy   . Personal history of radiation therapy   . Postmenopausal bleeding 2000   benign EMB (Dr. Raphael Gibney)  . Urge urinary incontinence     Past Surgical History:  Procedure Laterality Date  . CATARACT EXTRACTION  summer 2011   bilateral  . CHOLECYSTECTOMY, LAPAROSCOPIC  1/05  . COLONOSCOPY  12/17/08   normal (Dr. Wynetta Emery)  . COLONOSCOPY W/ POLYPECTOMY  05/23/01   adenomatous polyp  . MASTECTOMY Right 2001  . modified mastectomy and tram flap reconstruction  2001   R breast  . TONSILLECTOMY    . tram flap removal     problems with flap after  radiation  . TUBAL LIGATION       Medications: Current Meds  Medication Sig  . acetaminophen (TYLENOL) 650 MG CR tablet Take 1,300 mg by mouth 2 (two) times daily.  Marland Kitchen allopurinol (ZYLOPRIM) 100 MG tablet TAKE TWO TABLETS BY MOUTH DAILY  . calcium carbonate (TUMS - DOSED IN MG ELEMENTAL CALCIUM) 500 MG chewable tablet Chew 1 tablet by mouth daily as needed for indigestion or heartburn. Reported on 07/10/2015  . Calcium Carbonate-Vitamin D (CALTRATE 600+D) 600-400 MG-UNIT per tablet Take 1 tablet by mouth daily. Takes one in the am everyday and Monday,wednesday and Friday take one at night also  . colchicine 0.6 MG tablet Use as directed. Take two tablets by mouth at start of gout flare up. Then one hour later take one tablet by mouth.  . furosemide (LASIX) 20 MG tablet Take 1 tablet (20 mg total) by mouth as needed.  . Multiple Vitamins-Minerals (CENTRUM SILVER PO) Take 1  tablet by mouth daily.    Marland Kitchen omeprazole (PRILOSEC) 20 MG capsule Take 1 capsule (20 mg total) by mouth daily. Reported on 05/23/2015  . pravastatin (PRAVACHOL) 40 MG tablet Take 1 tablet (40 mg total) by mouth daily.  . Rivaroxaban (XARELTO) 15 MG TABS tablet TAKE ONE TABLET BY MOUTH DAILY WITH SUPPER.  Marland Kitchen tolterodine (DETROL LA) 4 MG 24 hr capsule TAKE ONE CAPSULE BY MOUTH DAILY  . [DISCONTINUED] diltiazem (CARTIA XT) 120 MG 24 hr capsule Take 2 capsules (240 mg total) by mouth daily.  . [DISCONTINUED] furosemide (LASIX) 20 MG tablet Take 20 mg by mouth as needed.  . [DISCONTINUED] hydrochlorothiazide (HYDRODIURIL) 25 MG tablet Take 1 tablet (25 mg total) by mouth daily.  . [DISCONTINUED] XARELTO 15 MG TABS tablet TAKE ONE TABLET BY MOUTH DAILY WITH SUPPER.     Allergies: Allergies  Allergen Reactions  . Cephalexin Itching  . Ciprofloxacin Itching  . Diclofenac Nausea Only    Upset stomach  . Shellfish-Derived Products Nausea And Vomiting    Social History: The patient  reports that she has never smoked. She has never used smokeless tobacco. She reports that she does not drink alcohol or use drugs.   Family History: The patient's family history includes Diabetes in her brother; Heart disease in her brother, father, and mother; Hypertension in her father and mother.   Review of Systems: Please see the history of present illness.   All other systems are reviewed and negative.   Physical Exam: VS:  BP 118/62   Pulse (!) 121   Ht 5' (1.524 m)   Wt 158 lb 1.9 oz (71.7 kg)   BMI 30.88 kg/m  .  BMI Body mass index is 30.88 kg/m.  Wt Readings from Last 3 Encounters:  05/30/19 158 lb 1.9 oz (71.7 kg)  05/29/19 157 lb 9.6 oz (71.5 kg)  05/23/19 160 lb 12.8 oz (72.9 kg)    General: Pleasant. Elderly. Alert and in no acute distress. She is in a wheelchair.   HEENT: Normal.  Neck: Supple, no JVD, carotid bruits, or masses noted.  Cardiac: Irregular irregular and still fast.  No  murmurs, rubs, or gallops. Lower legs/shins with mild edema.  Respiratory:  Lungs are clear to auscultation bilaterally with normal work of breathing.  GI: Soft and nontender.  MS: No deformity or atrophy. Gait and ROM intact.  Skin: Warm and dry. Color is normal.  Neuro:  Strength and sensation are intact and no gross focal deficits noted.  Psych: Alert, appropriate and with  normal affect.   LABORATORY DATA:  EKG:  EKG is ordered today. This demonstrates AF with RVR - HR is 121 today.  Lab Results  Component Value Date   WBC 7.9 05/22/2019   HGB 11.8 05/22/2019   HCT 34.9 05/22/2019   PLT 200 05/22/2019   GLUCOSE 96 05/22/2019   CHOL 160 01/04/2019   TRIG 228 (H) 01/04/2019   HDL 44 01/04/2019   LDLCALC 78 01/04/2019   ALT 14 01/04/2019   AST 21 01/04/2019   NA 139 05/22/2019   K 4.2 05/22/2019   CL 100 05/22/2019   CREATININE 1.18 (H) 05/22/2019   BUN 25 05/22/2019   CO2 25 05/22/2019   TSH 2.840 05/22/2019     BNP (last 3 results) No results for input(s): BNP in the last 8760 hours.  ProBNP (last 3 results) No results for input(s): PROBNP in the last 8760 hours.   Other Studies Reviewed Today:  [facility] Monitor Study Highlights 02/2019   Paroxysmal atrial fibrillation, total burden 2%. Longest episode greater than 5 hours.  7 beat wide-complex tachycardia  Predominant rhythm normal sinus with PACs and PVCs.  No sustained ventricular tachycardia.    Belva Crome, MD  02/24/2019 2:03 PM EDT    Let the patient know has paroxysmal AF. Needs to stay on anticoagulation therapy. A copy will be sent to Rita Ohara, MD    Cardiac telemetry monitoring 11/24/2016:  Normal sinus rhythm with average HR 88 bpm  Paroxysmal atrial fibrillation with RVR, present 19% of the time   Echocardiogram 06/26/2015: Study Conclusions  - Left ventricle: The cavity size was normal. Wall thickness was increased in a pattern of mild LVH. Systolic function was  normal. The estimated ejection fraction was in the range of 50% to 55%. Wall motion was normal; there were no regional wall motion abnormalities. - Aortic valve: Trileaflet; mildly thickened, mildly calcified leaflets. There was mild regurgitation. - Mitral valve: Calcified annulus. There was mild regurgitation. - Pericardium, extracardiac: A trivial pericardial effusion was identified posterior to the heart.    ASSESSMENT AND PLAN:  1. AF with RVR - unclear if this is persistent by her notes - still may be going back and forth - she does not wish to have cardioversion or be at the hospital until she is fully vaccinated. Left atrium just at upper level of normal on last echo - should cardiovert but may revert back. She is hesitant about AAD therapy. She is opting for continued attempts at rate control. Adding Metoprolol 25 mg BID today. Will leave on Cartia XT 240 mg a day.   Daughter Lisbeth Renshaw was updated by phone today.   2. HTN - BP still little soft - stopping HCTZ today. She will use her Lasix prn. She remains off her ACE as well.   3. Chronic anticoagulation - Xarelto refilled today.   4. Prior fall - legs are slowly healing up.   5. Advanced age  11. COVID-19 Education: The signs and symptoms of COVID-19 were discussed with the patient and how to seek care for testing (follow up with PCP or arrange E-visit).  The importance of social distancing, staying at home, hand hygiene and wearing a mask when out in public were discussed today.  Current medicines are reviewed with the patient today.  The patient does not have concerns regarding medicines other than what has been noted above.  The following changes have been made:  See above.  Labs/ tests ordered today include:  Orders Placed This Encounter  Procedures  . EKG 12-Lead     Disposition:   FU with me next week with EKG.  Patient is agreeable to this plan and will call if any problems develop in the  interim.   SignedTruitt Merle, NP  05/30/2019 2:35 PM  [facility] [address] [place], [place]  [address] Phone: [phone] Fax: [phone]

## 2019-05-28 NOTE — Progress Notes (Signed)
Chief Complaint  Patient presents with  . Follow-up    on leg contusions and bruising.    Patient presents for recheck of wounds on legs. She sustained a fall on 05/18/19 (tripped over a cart she couldn't see due to her mask). She had been seen at [facility], had reportedly negative x-rays. She saw Dr. Redmond School on 1/19 for f/u, who requested a recheck today. Concern about eschar and poor healing at R shin.  Her pain has improved--now able to walk better. She has been applying warm compresses to the swollen areas of her shins.  She has left the wound open to air.  She reports no drainage, the redness has improved significantly.  Denies fever.  She feels like it is improving. Husband brought phone with photos to document changes in appearance.  She reports ongoing swelling in the shins.  The bruising in the feet is improving, but as expected, she is seeing more bruising into her calfs (since elevating legs).  Denies pain in feet or calves.  She saw cardiologist on 1/18, in atrial fibrillation, not well controlled.  They made some adjustments to her meds--cardizem dose was increased to 240mg , and lisinopril HCT was changed to just HCTZ (and increased to 25mg ).  She doesn't feel great when her BP's are on the lower end.  Brings in paper with BP's ranging from [id number] to 0000000 systolic. She will bring this paper to her cardiology f/u appt.  She is back in atrial fibrillation.  Currently feels fine--no dizziness or SOB.  Occasionally swelling noted in legs (gone by morning), even with higher dose of HCTZ.   PMH, PSH, SH reviewed.  She states she got first dose of Moderna COVID vaccine (at [facility])--abstracted. Scheduled for 2nd dose.  They are hoping to be able to take a cruise from [place] (to [place]) in Mid-May.  Outpatient Encounter Medications as of 05/29/2019  Medication Sig Note  . acetaminophen (TYLENOL) 650 MG CR tablet Take 1,300 mg by mouth 2 (two) times daily.   Marland Kitchen allopurinol (ZYLOPRIM) 100  MG tablet TAKE TWO TABLETS BY MOUTH DAILY   . Calcium Carbonate-Vitamin D (CALTRATE 600+D) 600-400 MG-UNIT per tablet Take 1 tablet by mouth daily. Takes one in the am everyday and Monday,wednesday and Friday take one at night also   . diltiazem (CARTIA XT) 120 MG 24 hr capsule Take 2 capsules (240 mg total) by mouth daily.   . hydrochlorothiazide (HYDRODIURIL) 25 MG tablet Take 1 tablet (25 mg total) by mouth daily.   . Multiple Vitamins-Minerals (CENTRUM SILVER PO) Take 1 tablet by mouth daily.     . pravastatin (PRAVACHOL) 40 MG tablet Take 1 tablet (40 mg total) by mouth daily.   Marland Kitchen tolterodine (DETROL LA) 4 MG 24 hr capsule TAKE ONE CAPSULE BY MOUTH DAILY   . XARELTO 15 MG TABS tablet TAKE ONE TABLET BY MOUTH DAILY WITH SUPPER.   . calcium carbonate (TUMS - DOSED IN MG ELEMENTAL CALCIUM) 500 MG chewable tablet Chew 1 tablet by mouth daily as needed for indigestion or heartburn. Reported on 07/10/2015 11/28/2015: Uses prn heartburn, every few months  . colchicine 0.6 MG tablet Use as directed. Take two tablets by mouth at start of gout flare up. Then one hour later take one tablet by mouth.   . furosemide (LASIX) 20 MG tablet Take 20 mg by mouth as needed.   Marland Kitchen omeprazole (PRILOSEC) 20 MG capsule Take 1 capsule (20 mg total) by mouth daily. Reported on 05/23/2015 (Patient  not taking: Reported on 05/29/2019) 06/15/2016: Uses prn only, rarely   No facility-administered encounter medications on file as of 05/29/2019.   Allergies  Allergen Reactions  . Cephalexin Itching  . Ciprofloxacin Itching  . Diclofenac Nausea Only    Upset stomach  . Shellfish-Derived Products Nausea And Vomiting   ROS:  No fever, chills, URI symptoms, cough, shortness of breath, chest pain, palpitations.  Intermittently lower BP's where she feels a little weak. Some intermittent edema, worse at end of day, gone by morning. No bleeding.  +ongoing/resolving bruising related to her injury. No rash or other  concerns.   PHYSICAL EXAM:  BP (!) 150/80   Pulse 88   Temp (!) 96.2 F (35.7 C) (Other (Comment))   Ht 5' (1.524 m)   Wt 157 lb 9.6 oz (71.5 kg)   BMI 30.78 kg/m   Wt Readings from Last 3 Encounters:  05/23/19 160 lb 12.8 oz (72.9 kg)  05/22/19 160 lb (72.6 kg)  01/11/19 156 lb 3.2 oz (70.9 kg)   Well-appearing, talkative female, accompanied by her husband.  She is in good spirits, in no distress  Neck: no lymphadenopathy or mass Heart: irregularly irregular, approx 90-100 Lungs: clear Extremities:   37mm (width) x 7mm (height) eschar noted in the mid-shin area. Compared to photo from last week (on patient's phone), the height portion is smaller, width appears about the same. Photo showed some drainage, which is no longer present; area is dry. Significantly less erythema and decrease in swelling from last week.  Currently there is still significant light yellow ecchymosis throughout both shins, and blue-green discoloration (healing ecchymosis) on her feet. Raised area (contusion, +/- hematoma) at bilateral shins, improved per pt. No pitting edema  Neuro: alert and oriented, normal gait Psych: normal mood, affect, hygiene and grooming.   ASSESSMENT/PLAN:  Contusion of multiple sites of lower extremity, unspecified laterality, subsequent encounter - improving slowly, may take weeks to heal.  reviewed s/sx infection, to contact us if not healing for referral to wound clinic. Cont heat, elevation.  Essential hypertension, benign - wide range of BP's, feels a little weak when at the lower end (only 2-3 values on page of over 20). has f/u with cardiologist  Paroxysmal atrial fibrillation with rapid ventricular response ([place]) - Currently in atrial fibrillation, feeling well.  f/u with cardiology as planned. Cont xarelto

## 2019-05-29 ENCOUNTER — Encounter: Payer: Self-pay | Admitting: Family Medicine

## 2019-05-29 ENCOUNTER — Ambulatory Visit (INDEPENDENT_AMBULATORY_CARE_PROVIDER_SITE_OTHER): Payer: Medicare HMO | Admitting: Family Medicine

## 2019-05-29 ENCOUNTER — Other Ambulatory Visit: Payer: Self-pay

## 2019-05-29 VITALS — BP 150/80 | HR 88 | Temp 96.2°F | Ht 60.0 in | Wt 157.6 lb

## 2019-05-29 DIAGNOSIS — S8010XD Contusion of unspecified lower leg, subsequent encounter: Secondary | ICD-10-CM | POA: Diagnosis not present

## 2019-05-29 DIAGNOSIS — I10 Essential (primary) hypertension: Secondary | ICD-10-CM | POA: Diagnosis not present

## 2019-05-29 DIAGNOSIS — I480 Paroxysmal atrial fibrillation: Secondary | ICD-10-CM | POA: Diagnosis not present

## 2019-05-29 NOTE — Patient Instructions (Signed)
Your leg looks much better than last week.  It still has a long while to heal completely (at least a couple of weeks).  Continue to apply heat to the swollen parts of the shin. Continue to keep your skin open to air.  You may use bacitracin just over the wound on the right shin twice daily. Keep the legs elevated, when able to.  Contact us if it isn't healing at all (looks the same), so we can send you to a wound clinic. Seek re-evaluation if any weeping/drainage, increased swelling, redness, fever or other concerns for infection.

## 2019-05-30 ENCOUNTER — Ambulatory Visit: Payer: Medicare HMO | Admitting: Nurse Practitioner

## 2019-05-30 ENCOUNTER — Encounter: Payer: Self-pay | Admitting: Nurse Practitioner

## 2019-05-30 VITALS — BP 118/62 | HR 121 | Ht 60.0 in | Wt 158.1 lb

## 2019-05-30 DIAGNOSIS — I4891 Unspecified atrial fibrillation: Secondary | ICD-10-CM | POA: Diagnosis not present

## 2019-05-30 DIAGNOSIS — I10 Essential (primary) hypertension: Secondary | ICD-10-CM | POA: Diagnosis not present

## 2019-05-30 DIAGNOSIS — Z7189 Other specified counseling: Secondary | ICD-10-CM | POA: Diagnosis not present

## 2019-05-30 DIAGNOSIS — I480 Paroxysmal atrial fibrillation: Secondary | ICD-10-CM | POA: Diagnosis not present

## 2019-05-30 DIAGNOSIS — E7800 Pure hypercholesterolemia, unspecified: Secondary | ICD-10-CM

## 2019-05-30 MED ORDER — FUROSEMIDE 20 MG PO TABS: 20.0000 mg | ORAL_TABLET | ORAL | 6 refills | Status: DC | PRN

## 2019-05-30 MED ORDER — DILTIAZEM HCL ER COATED BEADS 240 MG PO CP24: 240.0000 mg | ORAL_CAPSULE | Freq: Every day | ORAL | 3 refills | Status: DC

## 2019-05-30 MED ORDER — RIVAROXABAN 15 MG PO TABS: ORAL_TABLET | ORAL | 3 refills | Status: DC

## 2019-05-30 MED ORDER — METOPROLOL TARTRATE 25 MG PO TABS: 25.0000 mg | ORAL_TABLET | Freq: Two times a day (BID) | ORAL | 3 refills | Status: DC

## 2019-05-30 NOTE — Patient Instructions (Addendum)
After Visit Summary:  We will be checking the following labs today - NONE   Medication Instructions:    Continue with your current medicines. BUT  I want you to STOP HCTZ  Use your Furosemide for swelling that does not go away over night - I did refill this today  I am adding Lopressor 25 mg to take twice a day - this is to help lower your heart rate.   I will send you in a RX for the 240 mg of the Cartia - this will be just once a day  I have refilled the Xarelto    If you need a refill on your cardiac medications before your next appointment, please call your pharmacy.     Testing/Procedures To Be Arranged:  N/A  Follow-Up:   See me next week with repeat EKG    At [facility], you and your health needs are our priority.  As part of our continuing mission to provide you with exceptional heart care, we have created designated Provider Care Teams.  These Care Teams include your primary Cardiologist (physician) and Advanced Practice Providers (APPs -  Physician Assistants and Nurse Practitioners) who all work together to provide you with the care you need, when you need it.  Special Instructions:  . Stay safe, stay home, wash your hands for at least 20 seconds and wear a mask when out in public.  . It was good to talk with you today.    Call the [place] office at [phone] if you have any questions, problems or concerns.

## 2019-05-31 NOTE — Progress Notes (Deleted)
CARDIOLOGY OFFICE NOTE  Date:  05/31/2019    Connie [name] Date of Birth: May 19, 1927 Medical Record #[id number]  PCP:  Rita Ohara, MD  Cardiologist:  Jennings Books   No chief complaint on file.   History of Present Illness: Connie [name] is a 86 y.o. female who presents today for a one week check.  Seen for Dr. Tamala Julian.   She has a history of PAF, HTN and HLD. She is on anticoagulation. Prior monitor from 2018 showed 19% burden.   Last seen by Dr. Tamala Julian in 01/2018 - doing ok - BP was up - HCTZ was added. Seen this past September 2020 by Sharee Pimple - had had some recurrent AF reported - monitor was updated - anticoagulation was continued.   I saw her about 2 weeks ago - as a work in - she was back in AF with RVR.  We elected to increase her CCB. She remains on anticoagulation. Little more swelling. BP tends to be low when she is in AF and I stopped her ACE. At follow up last week, HR still variable - low dose beta blocker started. I stopped her HCTZ and encouraged her to use her Lasix prn. She did not wish to proceed with cardioversion until she was fully vaccinated.   Last echo from 2017 with LAE at upper limits of normal.  The patient {does/does not:200015} have symptoms concerning for COVID-19 infection (fever, chills, cough, or new shortness of breath).   Comes in today. Here with   Past Medical History:  Diagnosis Date  . Abnormal brain MRI 8/01   small hemorrhagic stroke and possible cavernous hemangioma (Dr. Erling Cruz)  . Adenomatous colon polyp 1/03  . Arthritis    OA spine, hands  . Breast cancer ([place]) 2001   R breast (T3N1, ER/PR+, HER-2 +) s/p mastectomy, chemo and chest wall irradiation (Dr. Truddie Coco)  . DDD (degenerative disc disease), lumbar   . GERD (gastroesophageal reflux disease)   . Hearing loss 2012   wears hearing aids  . Herpes zoster 2003  . Hyperlipidemia   . Hypertension   . Onychomycosis 2003, 2008   treated with Lamisil  .  Osteopenia    (DEXA's done by Dr. Truddie Coco); prev took Fosamax.  No change in DEXA after off Fosamax x 2 years  . Ovarian cyst 12/2008   right  . Personal history of chemotherapy   . Personal history of radiation therapy   . Postmenopausal bleeding 2000   benign EMB (Dr. Raphael Gibney)  . Urge urinary incontinence     Past Surgical History:  Procedure Laterality Date  . CATARACT EXTRACTION  summer 2011   bilateral  . CHOLECYSTECTOMY, LAPAROSCOPIC  1/05  . COLONOSCOPY  12/17/08   normal (Dr. Wynetta Emery)  . COLONOSCOPY W/ POLYPECTOMY  05/23/01   adenomatous polyp  . MASTECTOMY Right 2001  . modified mastectomy and tram flap reconstruction  2001   R breast  . TONSILLECTOMY    . tram flap removal     problems with flap after radiation  . TUBAL LIGATION       Medications: No outpatient medications have been marked as taking for the 06/06/19 encounter (Appointment) with Burtis Junes, NP.     Allergies: Allergies  Allergen Reactions  . Cephalexin Itching  . Ciprofloxacin Itching  . Diclofenac Nausea Only    Upset stomach  . Shellfish-Derived Products Nausea And Vomiting    Social History: The patient  reports that she has never  smoked. She has never used smokeless tobacco. She reports that she does not drink alcohol or use drugs.   Family History: The patient's ***family history includes Diabetes in her brother; Heart disease in her brother, father, and mother; Hypertension in her father and mother.   Review of Systems: Please see the history of present illness.   All other systems are reviewed and negative.   Physical Exam: VS:  There were no vitals taken for this visit. Marland Kitchen  BMI There is no height or weight on file to calculate BMI.  Wt Readings from Last 3 Encounters:  05/30/19 158 lb 1.9 oz (71.7 kg)  05/29/19 157 lb 9.6 oz (71.5 kg)  05/23/19 160 lb 12.8 oz (72.9 kg)    General: Pleasant. Well developed, well nourished and in no acute distress.   HEENT: Normal.  Neck:  Supple, no JVD, carotid bruits, or masses noted.  Cardiac: ***Regular rate and rhythm. No murmurs, rubs, or gallops. No edema.  Respiratory:  Lungs are clear to auscultation bilaterally with normal work of breathing.  GI: Soft and nontender.  MS: No deformity or atrophy. Gait and ROM intact.  Skin: Warm and dry. Color is normal.  Neuro:  Strength and sensation are intact and no gross focal deficits noted.  Psych: Alert, appropriate and with normal affect.   LABORATORY DATA:  EKG:  EKG {ACTION; IS/IS JME:[id number]} ordered today. This demonstrates ***.  Lab Results  Component Value Date   WBC 7.9 05/22/2019   HGB 11.8 05/22/2019   HCT 34.9 05/22/2019   PLT 200 05/22/2019   GLUCOSE 96 05/22/2019   CHOL 160 01/04/2019   TRIG 228 (H) 01/04/2019   HDL 44 01/04/2019   LDLCALC 78 01/04/2019   ALT 14 01/04/2019   AST 21 01/04/2019   NA 139 05/22/2019   K 4.2 05/22/2019   CL 100 05/22/2019   CREATININE 1.18 (H) 05/22/2019   BUN 25 05/22/2019   CO2 25 05/22/2019   TSH 2.840 05/22/2019     BNP (last 3 results) No results for input(s): BNP in the last 8760 hours.  ProBNP (last 3 results) No results for input(s): PROBNP in the last 8760 hours.   Other Studies Reviewed Today:  Zio MonitorStudy Highlights10/2020   Paroxysmal atrial fibrillation, total burden 2%. Longest episode greater than 5 hours.  7 beat wide-complex tachycardia  Predominant rhythm normal sinus with PACs and PVCs.  No sustained ventricular tachycardia.    Belva Crome, MD 02/24/2019 2:03 PM EDT    Let the patient know has paroxysmal AF. Needs to stay on anticoagulation therapy. A copy will be sent to Rita Ohara, MD    Cardiac telemetry monitoring 11/24/2016:  Normal sinus rhythm with average HR 88 bpm  Paroxysmal atrial fibrillation with RVR, present 19% of the time   Echocardiogram 06/26/2015: Study Conclusions  - Left ventricle: The cavity size was normal. Wall thickness  was increased in a pattern of mild LVH. Systolic function was normal. The estimated ejection fraction was in the range of 50% to 55%. Wall motion was normal; there were no regional wall motion abnormalities. - Aortic valve: Trileaflet; mildly thickened, mildly calcified leaflets. There was mild regurgitation. - Mitral valve: Calcified annulus. There was mild regurgitation. - Pericardium, extracardiac: A trivial pericardial effusion was identified posterior to the heart.    ASSESSMENT AND PLAN:  1. AF with RVR - unclear if this is persistent by her notes - still may be going back and forth - she does not  wish to have cardioversion or be at the hospital until she is fully vaccinated. Left atrium just at upper level of normal on last echo - should cardiovert but may revert back. She is hesitant about AAD therapy. She is opting for continued attempts at rate control. Adding Metoprolol 25 mg BID today. Will leave on Cartia XT 240 mg a day.   Daughter Lisbeth Renshaw was updated by phone today.   2. HTN - BP still little soft - stopping HCTZ today. She will use her Lasix prn. She remains off her ACE as well.   3. Chronic anticoagulation - Xarelto refilled today.   4. Prior fall - legs are slowly healing up.   5. Advanced age  . COVID-19 Education: The signs and symptoms of COVID-19 were discussed with the patient and how to seek care for testing (follow up with PCP or arrange E-visit).  The importance of social distancing, staying at home, hand hygiene and wearing a mask when out in public were discussed today.  Current medicines are reviewed with the patient today.  The patient does not have concerns regarding medicines other than what has been noted above.  The following changes have been made:  See above.  Labs/ tests ordered today include:   No orders of the defined types were placed in this encounter.    Disposition:   FU with *** in {gen number 9-62:229798} {Days to  years:10300}.   Patient is agreeable to this plan and will call if any problems develop in the interim.   SignedTruitt Merle, NP  05/31/2019 8:24 AM  [facility] [address] [place], [place]  [address] Phone: [phone] Fax: [phone]

## 2019-06-06 ENCOUNTER — Telehealth: Payer: Self-pay | Admitting: *Deleted

## 2019-06-06 ENCOUNTER — Ambulatory Visit: Payer: Medicare HMO | Admitting: Nurse Practitioner

## 2019-06-06 NOTE — Telephone Encounter (Deleted)
Will send to Lori to FYI. 

## 2019-06-06 NOTE — Telephone Encounter (Signed)
Noted  

## 2019-06-06 NOTE — Telephone Encounter (Addendum)
Scheduler called due to pt calling in to cancel appointment due to having a fever due to the second dose COVID vaccine. Scheduler was told to schedule pt next week with accompanied by spouse in comments. This was a 1 week f/u. Will send to [place] to [place].

## 2019-06-07 NOTE — Progress Notes (Signed)
CARDIOLOGY OFFICE NOTE  Date:  06/12/2019    [place] Date of Birth: 01/25/1928 Medical Record #[id number]  PCP:  Rita Ohara, MD  Cardiologist:  Jennings Books   Chief Complaint  Patient presents with  . Atrial Fibrillation    Follow up visit - seen for Dr. Tamala Julian    History of Present Illness: Connie [name] is a 86 y.o. female who presents today for a 2 week check.   Seen for Dr. Tamala Julian.   She has a history of PAF, HTN and HLD. She is on anticoagulation. Prior monitor from 2018 showed 19% burden.   Last seen by Dr. Tamala Julian in 01/2018 - doing ok - BP was up - HCTZ was added. Seen this past September 2020 by Sharee Pimple - had had some recurrent AF reported - monitor was updated - anticoagulation was continued.   I have seen her a few times over the past month - she is back in AF with RVR - trying to rate control - BP low with her AF and so we have cut other medicines back to compensate. She has wished to avoid going to the hospital for cardioversion due to the pandemic. She has also had a recent fall with trauma to both lower legs making ambulation difficult.   Last echo from 2017 with LAE at upper limits of normal.  The patient does not have symptoms concerning for COVID-19 infection (fever, chills, cough, or new shortness of breath).   Comes in today. Here with her husband today. She is doing better. She feels like she went back in to NSR shortly after last visit. She has had her second vaccine - had a fever and did not feel well for a day or so - this is now better. No chest pain. Breathing is good. BP has started trending up as HR has come down. She is not dizzy or lightheaded. Weight is down. Not using any Lasix. They are very pleased with how she is doing. She is trying to start walking more - continues to recover from her prior fall.   Past Medical History:  Diagnosis Date  . Abnormal brain MRI 8/01   small hemorrhagic stroke and possible cavernous  hemangioma (Dr. Erling Cruz)  . Adenomatous colon polyp 1/03  . Arthritis    OA spine, hands  . Breast cancer ([place]) 2001   R breast (T3N1, ER/PR+, HER-2 +) s/p mastectomy, chemo and chest wall irradiation (Dr. Truddie Coco)  . DDD (degenerative disc disease), lumbar   . GERD (gastroesophageal reflux disease)   . Hearing loss 2012   wears hearing aids  . Herpes zoster 2003  . Hyperlipidemia   . Hypertension   . Onychomycosis 2003, 2008   treated with Lamisil  . Osteopenia    (DEXA's done by Dr. Truddie Coco); prev took Fosamax.  No change in DEXA after off Fosamax x 2 years  . Ovarian cyst 12/2008   right  . Personal history of chemotherapy   . Personal history of radiation therapy   . Postmenopausal bleeding 2000   benign EMB (Dr. Raphael Gibney)  . Urge urinary incontinence     Past Surgical History:  Procedure Laterality Date  . CATARACT EXTRACTION  summer 2011   bilateral  . CHOLECYSTECTOMY, LAPAROSCOPIC  1/05  . COLONOSCOPY  12/17/08   normal (Dr. Wynetta Emery)  . COLONOSCOPY W/ POLYPECTOMY  05/23/01   adenomatous polyp  . MASTECTOMY Right 2001  . modified mastectomy and tram flap reconstruction  2001  R breast  . TONSILLECTOMY    . tram flap removal     problems with flap after radiation  . TUBAL LIGATION       Medications: Current Meds  Medication Sig  . acetaminophen (TYLENOL) 650 MG CR tablet Take 1,300 mg by mouth 2 (two) times daily.  Marland Kitchen allopurinol (ZYLOPRIM) 100 MG tablet TAKE TWO TABLETS BY MOUTH DAILY  . calcium carbonate (TUMS - DOSED IN MG ELEMENTAL CALCIUM) 500 MG chewable tablet Chew 1 tablet by mouth daily as needed for indigestion or heartburn. Reported on 07/10/2015  . Calcium Carbonate-Vitamin D (CALTRATE 600+D) 600-400 MG-UNIT per tablet Take 1 tablet by mouth daily. Takes one in the am everyday and Monday,wednesday and Friday take one at night also  . colchicine 0.6 MG tablet Use as directed. Take two tablets by mouth at start of gout flare up. Then one hour later take one  tablet by mouth.  . diltiazem (CARDIZEM CD) 240 MG 24 hr capsule Take 1 capsule (240 mg total) by mouth daily.  . furosemide (LASIX) 20 MG tablet Take 1 tablet (20 mg total) by mouth as needed.  . metoprolol tartrate (LOPRESSOR) 25 MG tablet Take 1 tablet (25 mg total) by mouth 2 (two) times daily.  . Multiple Vitamins-Minerals (CENTRUM SILVER PO) Take 1 tablet by mouth daily.    Marland Kitchen omeprazole (PRILOSEC) 20 MG capsule Take 1 capsule (20 mg total) by mouth daily. Reported on 05/23/2015  . pravastatin (PRAVACHOL) 40 MG tablet Take 1 tablet (40 mg total) by mouth daily.  . Rivaroxaban (XARELTO) 15 MG TABS tablet TAKE ONE TABLET BY MOUTH DAILY WITH SUPPER.  Marland Kitchen tolterodine (DETROL LA) 4 MG 24 hr capsule TAKE ONE CAPSULE BY MOUTH DAILY     Allergies: Allergies  Allergen Reactions  . Cephalexin Itching  . Ciprofloxacin Itching  . Diclofenac Nausea Only    Upset stomach  . Shellfish-Derived Products Nausea And Vomiting    Social History: The patient  reports that she has never smoked. She has never used smokeless tobacco. She reports that she does not drink alcohol or use drugs.   Family History: The patient's family history includes Diabetes in her brother; Heart disease in her brother, father, and mother; Hypertension in her father and mother.   Review of Systems: Please see the history of present illness.   All other systems are reviewed and negative.   Physical Exam: VS:  BP (!) 146/68   Pulse (!) 59   Ht 5' (1.524 m)   Wt 151 lb (68.5 kg)   BMI 29.49 kg/m  .  BMI Body mass index is 29.49 kg/m.  Wt Readings from Last 3 Encounters:  06/12/19 151 lb (68.5 kg)  05/30/19 158 lb 1.9 oz (71.7 kg)  05/29/19 157 lb 9.6 oz (71.5 kg)    General: Pleasant. Elderly. Alert and in no acute distress. She is walking today.   HEENT: Normal.  Neck: Supple, no JVD, carotid bruits, or masses noted.  Cardiac: Regular rate and rhythm. No murmurs, rubs, or gallops. Less edema. Abrasions on her  shins continue to improve.  Respiratory:  Lungs are clear to auscultation bilaterally with normal work of breathing.  MS:  Gait and ROM intact.  Skin: Warm and dry. Color is normal.  Neuro:  Strength and sensation are intact and no gross focal deficits noted.  Psych: Alert, appropriate and with normal affect.   LABORATORY DATA:  EKG:  EKG is ordered today. This demonstrates NSR with borderline 1st  degree AV block - HR is 59 today.  Lab Results  Component Value Date   WBC 7.9 05/22/2019   HGB 11.8 05/22/2019   HCT 34.9 05/22/2019   PLT 200 05/22/2019   GLUCOSE 96 05/22/2019   CHOL 160 01/04/2019   TRIG 228 (H) 01/04/2019   HDL 44 01/04/2019   LDLCALC 78 01/04/2019   ALT 14 01/04/2019   AST 21 01/04/2019   NA 139 05/22/2019   K 4.2 05/22/2019   CL 100 05/22/2019   CREATININE 1.18 (H) 05/22/2019   BUN 25 05/22/2019   CO2 25 05/22/2019   TSH 2.840 05/22/2019     BNP (last 3 results) No results for input(s): BNP in the last 8760 hours.  ProBNP (last 3 results) No results for input(s): PROBNP in the last 8760 hours.   Other Studies Reviewed Today:   Zio MonitorStudy Highlights10/2020   Paroxysmal atrial fibrillation, total burden 2%. Longest episode greater than 5 hours.  7 beat wide-complex tachycardia  Predominant rhythm normal sinus with PACs and PVCs.  No sustained ventricular tachycardia.    Belva Crome, MD 02/24/2019 2:03 PM EDT    Let the patient know has paroxysmal AF. Needs to stay on anticoagulation therapy. A copy will be sent to Rita Ohara, MD    Cardiac telemetry monitoring 11/24/2016:  Normal sinus rhythm with average HR 88 bpm  Paroxysmal atrial fibrillation with RVR, present 19% of the time   Echocardiogram 06/26/2015: Study Conclusions  - Left ventricle: The cavity size was normal. Wall thickness was increased in a pattern of mild LVH. Systolic function was normal. The estimated ejection fraction was in the range  of 50% to 55%. Wall motion was normal; there were no regional wall motion abnormalities. - Aortic valve: Trileaflet; mildly thickened, mildly calcified leaflets. There was mild regurgitation. - Mitral valve: Calcified annulus. There was mild regurgitation. - Pericardium, extracardiac: A trivial pericardial effusion was identified posterior to the heart.    ASSESSMENT AND PLAN:  1. PAF - had long spell of several weeks of RVR - now on low dose beta blocker along with her CCB - now back in NSR - she is doing well clinically. I am leaving her on this regimen - no changes made today.   2. HTN - BP has trended back up as HR has come down - adding back her HCTZ 25 mg a day - she was on Lisinopril HCT in the past - will hold on this for now and just use the plain HCTZ. Lab today. She will continue to monitor.   3. Chronic anticoagulation - no problems with Xarelto.   4. Prior fall - she continues to recover.   5. Advanced age  26. COVID-19 Education: The signs and symptoms of COVID-19 were discussed with the patient and how to seek care for testing (follow up with PCP or arrange E-visit).  The importance of social distancing, staying at home, hand hygiene and wearing a mask when out in public were discussed today. She has had both vaccines.   Current medicines are reviewed with the patient today.  The patient does not have concerns regarding medicines other than what has been noted above.  The following changes have been made:  See above.  Labs/ tests ordered today include:    Orders Placed This Encounter  Procedures  . Basic metabolic panel  . EKG 12-Lead     Disposition:   FU with Dr. Tamala Julian in about 2 months. Lab today.  Patient is agreeable to this plan and will call if any problems develop in the interim.   SignedTruitt Merle, NP  06/12/2019 2:44 PM  [facility] [address] [place], [place]  [address] Phone: [phone] Fax: [phone]

## 2019-06-12 ENCOUNTER — Other Ambulatory Visit: Payer: Self-pay

## 2019-06-12 ENCOUNTER — Ambulatory Visit: Payer: Medicare HMO | Admitting: Nurse Practitioner

## 2019-06-12 ENCOUNTER — Encounter: Payer: Self-pay | Admitting: Nurse Practitioner

## 2019-06-12 VITALS — BP 146/68 | HR 59 | Ht 60.0 in | Wt 151.0 lb

## 2019-06-12 DIAGNOSIS — I480 Paroxysmal atrial fibrillation: Secondary | ICD-10-CM

## 2019-06-12 DIAGNOSIS — Z7189 Other specified counseling: Secondary | ICD-10-CM | POA: Diagnosis not present

## 2019-06-12 DIAGNOSIS — I10 Essential (primary) hypertension: Secondary | ICD-10-CM | POA: Diagnosis not present

## 2019-06-12 DIAGNOSIS — Z7901 Long term (current) use of anticoagulants: Secondary | ICD-10-CM | POA: Diagnosis not present

## 2019-06-12 MED ORDER — HYDROCHLOROTHIAZIDE 25 MG PO TABS: 25.0000 mg | ORAL_TABLET | Freq: Every day | ORAL | 3 refills | Status: DC

## 2019-06-12 NOTE — Patient Instructions (Addendum)
After Visit Summary:  We will be checking the following labs today - BMET   Medication Instructions:    Continue with your current medicines. BUT  I am adding back the HCTZ 25 mg a day - you should have this at home. Let me know if you do not.    If you need a refill on your cardiac medications before your next appointment, please call your pharmacy.     Testing/Procedures To Be Arranged:  N/A  Follow-Up:   See Dr. Tamala Julian in about 8 weeks    At [facility], you and your health needs are our priority.  As part of our continuing mission to provide you with exceptional heart care, we have created designated Provider Care Teams.  These Care Teams include your primary Cardiologist (physician) and Advanced Practice Providers (APPs -  Physician Assistants and Nurse Practitioners) who all work together to provide you with the care you need, when you need it.  Special Instructions:  . Stay safe, stay home, wash your hands for at least 20 seconds and wear a mask when out in public.  . It was good to talk with you today. Marland Kitchen Keep a check on your BP and HR for me.     Call the [place] office at [phone] if you have any questions, problems or concerns.

## 2019-06-13 LAB — BASIC METABOLIC PANEL
BUN/Creatinine Ratio: 18 (ref 12–28)
BUN: 24 mg/dL (ref 8–27)
CO2: 25 mmol/L (ref 20–29)
Calcium: 9.6 mg/dL (ref 8.7–10.3)
Chloride: 105 mmol/L (ref 96–106)
Creatinine, Ser: 1.31 mg/dL — ABNORMAL HIGH (ref 0.57–1.00)
GFR calc Af Amer: 42 mL/min/{1.73_m2} — ABNORMAL LOW (ref >59)
GFR calc non Af Amer: 36 mL/min/{1.73_m2} — ABNORMAL LOW (ref >59)
Glucose: 114 mg/dL — ABNORMAL HIGH (ref 65–99)
Potassium: 4.5 mmol/L (ref 3.5–5.2)
Sodium: 142 mmol/L (ref 134–144)

## 2019-06-14 ENCOUNTER — Other Ambulatory Visit: Payer: Self-pay | Admitting: *Deleted

## 2019-06-14 DIAGNOSIS — Z79899 Other long term (current) drug therapy: Secondary | ICD-10-CM

## 2019-06-19 ENCOUNTER — Other Ambulatory Visit: Payer: Self-pay | Admitting: Family Medicine

## 2019-06-28 ENCOUNTER — Other Ambulatory Visit: Payer: Medicare HMO | Admitting: *Deleted

## 2019-06-28 ENCOUNTER — Other Ambulatory Visit: Payer: Self-pay

## 2019-06-28 ENCOUNTER — Other Ambulatory Visit: Payer: Self-pay | Admitting: Family Medicine

## 2019-06-28 DIAGNOSIS — Z79899 Other long term (current) drug therapy: Secondary | ICD-10-CM

## 2019-06-28 DIAGNOSIS — H5202 Hypermetropia, left eye: Secondary | ICD-10-CM | POA: Diagnosis not present

## 2019-06-28 LAB — BASIC METABOLIC PANEL
BUN/Creatinine Ratio: 30 — ABNORMAL HIGH (ref 12–28)
BUN: 41 mg/dL — ABNORMAL HIGH (ref 8–27)
CO2: 27 mmol/L (ref 20–29)
Calcium: 10.5 mg/dL — ABNORMAL HIGH (ref 8.7–10.3)
Chloride: 101 mmol/L (ref 96–106)
Creatinine, Ser: 1.36 mg/dL — ABNORMAL HIGH (ref 0.57–1.00)
GFR calc Af Amer: 40 mL/min/{1.73_m2} — ABNORMAL LOW (ref >59)
GFR calc non Af Amer: 35 mL/min/{1.73_m2} — ABNORMAL LOW (ref >59)
Glucose: 142 mg/dL — ABNORMAL HIGH (ref 65–99)
Potassium: 4.5 mmol/L (ref 3.5–5.2)
Sodium: 142 mmol/L (ref 134–144)

## 2019-06-29 ENCOUNTER — Other Ambulatory Visit: Payer: Self-pay | Admitting: *Deleted

## 2019-06-29 DIAGNOSIS — Z79899 Other long term (current) drug therapy: Secondary | ICD-10-CM

## 2019-06-29 DIAGNOSIS — I4891 Unspecified atrial fibrillation: Secondary | ICD-10-CM

## 2019-07-01 ENCOUNTER — Other Ambulatory Visit: Payer: Self-pay | Admitting: Family Medicine

## 2019-07-01 DIAGNOSIS — E7800 Pure hypercholesterolemia, unspecified: Secondary | ICD-10-CM

## 2019-07-04 ENCOUNTER — Ambulatory Visit: Payer: Medicare HMO | Admitting: Sports Medicine

## 2019-07-04 ENCOUNTER — Encounter: Payer: Self-pay | Admitting: Sports Medicine

## 2019-07-04 ENCOUNTER — Other Ambulatory Visit: Payer: Medicare HMO

## 2019-07-04 ENCOUNTER — Other Ambulatory Visit: Payer: Self-pay

## 2019-07-04 VITALS — Temp 98.1°F

## 2019-07-04 DIAGNOSIS — Z7901 Long term (current) use of anticoagulants: Secondary | ICD-10-CM

## 2019-07-04 DIAGNOSIS — L84 Corns and callosities: Secondary | ICD-10-CM

## 2019-07-04 DIAGNOSIS — I10 Essential (primary) hypertension: Secondary | ICD-10-CM

## 2019-07-04 DIAGNOSIS — E782 Mixed hyperlipidemia: Secondary | ICD-10-CM | POA: Diagnosis not present

## 2019-07-04 DIAGNOSIS — Z5181 Encounter for therapeutic drug level monitoring: Secondary | ICD-10-CM | POA: Diagnosis not present

## 2019-07-04 DIAGNOSIS — M79672 Pain in left foot: Secondary | ICD-10-CM

## 2019-07-04 DIAGNOSIS — B351 Tinea unguium: Secondary | ICD-10-CM | POA: Diagnosis not present

## 2019-07-04 DIAGNOSIS — M79671 Pain in right foot: Secondary | ICD-10-CM | POA: Diagnosis not present

## 2019-07-04 DIAGNOSIS — M79675 Pain in left toe(s): Secondary | ICD-10-CM | POA: Diagnosis not present

## 2019-07-04 DIAGNOSIS — M79674 Pain in right toe(s): Secondary | ICD-10-CM

## 2019-07-04 NOTE — Progress Notes (Signed)
Subjective: Connie [name] is a 86 y.o. female patient seen today in office with complaint of painful thickened and elongated toenails and callus; unable to trim. Patient denies changes with medical history; still on Xalerto for Afib like before.  Reports that he is taking a really long time for her shin injury to heal on the right however now it is dry and scabbed over and less painful than before.  Patient has no other pedal complaints at this time.   Patient Active Problem List   Diagnosis Date Noted  . Chronic anticoagulation 05/11/2016  . Atrial fibrillation with rapid ventricular response ([place]) 06/24/2015  . Gout 10/30/2013  . Gout of big toe 10/30/2013  . Osteoarthritis of multiple joints 10/30/2013  . Breast cancer, right breast ([place]) 11/25/2012  . Urge incontinence of urine 04/24/2011  . OA (osteoarthritis) 04/24/2011  . Essential hypertension, benign 10/23/2010  . Pure hypercholesterolemia 10/23/2010    Current Outpatient Medications on File Prior to Visit  Medication Sig Dispense Refill  . acetaminophen (TYLENOL) 650 MG CR tablet Take 1,300 mg by mouth 2 (two) times daily.    Marland Kitchen allopurinol (ZYLOPRIM) 100 MG tablet TAKE TWO TABLETS BY MOUTH DAILY 180 tablet 0  . calcium carbonate (TUMS - DOSED IN MG ELEMENTAL CALCIUM) 500 MG chewable tablet Chew 1 tablet by mouth daily as needed for indigestion or heartburn. Reported on 07/10/2015    . Calcium Carbonate-Vitamin D (CALTRATE 600+D) 600-400 MG-UNIT per tablet Take 1 tablet by mouth daily. Takes one in the am everyday and Monday,wednesday and Friday take one at night also    . colchicine 0.6 MG tablet Use as directed. Take two tablets by mouth at start of gout flare up. Then one hour later take one tablet by mouth.    . diltiazem (CARDIZEM CD) 240 MG 24 hr capsule Take 1 capsule (240 mg total) by mouth daily. 90 capsule 3  . furosemide (LASIX) 20 MG tablet Take 1 tablet (20 mg total) by mouth as needed. 30 tablet 6  .  hydrochlorothiazide (HYDRODIURIL) 25 MG tablet Take 1 tablet (25 mg total) by mouth daily. 90 tablet 3  . metoprolol tartrate (LOPRESSOR) 25 MG tablet Take 1 tablet (25 mg total) by mouth 2 (two) times daily. 180 tablet 3  . Multiple Vitamins-Minerals (CENTRUM SILVER PO) Take 1 tablet by mouth daily.      Marland Kitchen omeprazole (PRILOSEC) 20 MG capsule Take 1 capsule (20 mg total) by mouth daily. Reported on 05/23/2015    . pravastatin (PRAVACHOL) 40 MG tablet Take 1 tablet (40 mg total) by mouth daily. 90 tablet 1  . Rivaroxaban (XARELTO) 15 MG TABS tablet TAKE ONE TABLET BY MOUTH DAILY WITH SUPPER. 90 tablet 3  . tolterodine (DETROL LA) 4 MG 24 hr capsule TAKE ONE CAPSULE BY MOUTH DAILY 90 capsule 0   No current facility-administered medications on file prior to visit.    Allergies  Allergen Reactions  . Cephalexin Itching  . Ciprofloxacin Itching  . Diclofenac Nausea Only    Upset stomach  . Shellfish-Derived Products Nausea And Vomiting    Objective: Physical Exam  General: Well developed, nourished, no acute distress, awake, alert and oriented x 3  Vascular: Dorsalis pedis artery 1/4 bilateral, Posterior tibial artery 0/4 bilateral, skin temperature warm to warm proximal to distal bilateral lower extremities, trace edema at ankles bilateral, pedal hair present bilateral.  Neurological: Gross sensation present via light touch bilateral.   Dermatological: Skin is warm, dry, and supple bilateral, Nails  1-10 are tender, long, thick, and discolored with mild subungal debris, no webspace macerations present bilateral, no open lesions present bilateral, mild reactive keratosis medial aspect of right bunion greater than plantar left forefoot. No signs of infection bilateral.  Abrasion to anterior shin on the right that appears to be healing well without any issues.  Musculoskeletal: Asymptomatic bunion boney deformities noted bilateral. Muscular strength within normal limits without painon range  of motion. No pain with calf compression bilateral.   Assessment and Plan:  Problem List Items Addressed This Visit    None    Visit Diagnoses    Pain due to onychomycosis of toenails of both feet    -  Primary   Callus of foot       Foot pain, bilateral       Anticoagulant long-term use         -Examined patient.  -Re-discussed treatment options for painful mycotic nails  -Mechanically debrided and reduced mycotic nails with sterile nail nipper and dremel nail file without incident and at no additional charge mechanically debrided calluses x1 using a sterile chisel blade without incident -Continue with PCP follow-up for abrasion on anterior shin on right that appears to be healing well -Continue with good supportive shoes for foot type -Patient to return in 2.5 to 3 months for follow up evaluation or sooner if symptoms worsen.  Landis Martins, DPM

## 2019-07-05 LAB — CBC WITH DIFFERENTIAL/PLATELET
Basophils Absolute: 0 10*3/uL (ref 0.0–0.2)
Basos: 1 %
EOS (ABSOLUTE): 0.2 10*3/uL (ref 0.0–0.4)
Eos: 4 %
Hematocrit: 37.8 % (ref 34.0–46.6)
Hemoglobin: 12.8 g/dL (ref 11.1–15.9)
Immature Grans (Abs): 0 10*3/uL (ref 0.0–0.1)
Immature Granulocytes: 0 %
Lymphocytes Absolute: 1.8 10*3/uL (ref 0.7–3.1)
Lymphs: 30 %
MCH: 31.8 pg (ref 26.6–33.0)
MCHC: 33.9 g/dL (ref 31.5–35.7)
MCV: 94 fL (ref 79–97)
Monocytes Absolute: 0.6 10*3/uL (ref 0.1–0.9)
Monocytes: 9 %
Neutrophils Absolute: 3.6 10*3/uL (ref 1.4–7.0)
Neutrophils: 56 %
Platelets: 183 10*3/uL (ref 150–450)
RBC: 4.02 x10E6/uL (ref 3.77–5.28)
RDW: 13.2 % (ref 11.7–15.4)
WBC: 6.2 10*3/uL (ref 3.4–10.8)

## 2019-07-05 LAB — LIPID PANEL
Chol/HDL Ratio: 4.1 ratio (ref 0.0–4.4)
Cholesterol, Total: 155 mg/dL (ref 100–199)
HDL: 38 mg/dL — ABNORMAL LOW (ref >39)
LDL Chol Calc (NIH): 75 mg/dL (ref 0–99)
Triglycerides: 257 mg/dL — ABNORMAL HIGH (ref 0–149)
VLDL Cholesterol Cal: 42 mg/dL — ABNORMAL HIGH (ref 5–40)

## 2019-07-05 LAB — COMPREHENSIVE METABOLIC PANEL
ALT: 17 IU/L (ref 0–32)
AST: 21 IU/L (ref 0–40)
Albumin/Globulin Ratio: 1.8 (ref 1.2–2.2)
Albumin: 4.1 g/dL (ref 3.6–4.6)
Alkaline Phosphatase: 94 IU/L (ref 39–117)
BUN/Creatinine Ratio: 17 (ref 12–28)
BUN: 23 mg/dL (ref 8–27)
Bilirubin Total: 0.4 mg/dL (ref 0.0–1.2)
CO2: 28 mmol/L (ref 20–29)
Calcium: 10.1 mg/dL (ref 8.7–10.3)
Chloride: 102 mmol/L (ref 96–106)
Creatinine, Ser: 1.34 mg/dL — ABNORMAL HIGH (ref 0.57–1.00)
GFR calc Af Amer: 41 mL/min/{1.73_m2} — ABNORMAL LOW (ref >59)
GFR calc non Af Amer: 35 mL/min/{1.73_m2} — ABNORMAL LOW (ref >59)
Globulin, Total: 2.3 g/dL (ref 1.5–4.5)
Glucose: 100 mg/dL — ABNORMAL HIGH (ref 65–99)
Potassium: 3.8 mmol/L (ref 3.5–5.2)
Sodium: 144 mmol/L (ref 134–144)
Total Protein: 6.4 g/dL (ref 6.0–8.5)

## 2019-07-11 NOTE — Progress Notes (Signed)
Chief Complaint  Patient presents with  . Medicare Wellness    nonfasting AWV/CPE. Contusion of right lower leg healing but very slowly and left leg still bruised. Does complain of fatigue and SOB at times- thinks it due to her age.     Connie [name] is a 86 y.o. female who presents for annual physical exam, Medicare wellness visit and follow-up on chronic medical conditions.  She had labs checked prior to visit, see results below. She has the following concerns:  RLE wound (related to her fall) is healing very slowly, still has bruising. It doesn't bother her at all, but scab is still present.  Only once bled (without any known trauma).  Uses heat to the bruised areas on the shin, which helps with any discomfort.  Has been seeing the cardiologist more often related to having recurrent afib with RVR.  BP had dropped when in afib, so lisinopril HCT had been stopped.  Meds were adjusted by cardiologist (on cardizem and metoprolol). Doing better, less afib, and BP's drifted up again.  She was restarted on HCTZ (alone, not lisinopril) at last visit with her cardiologist. BP's have been running 117-160/60-77 in the mornings, with pulse 59-84; evenings 118-188/63-84, pulse mostly 60's (up to 103 when in afib).  Wide fluctuations in BP's, frequently is 130's/60's up to 160's in the evening. She denies headaches, dizziness, chest pain, muscle cramps. She continues on Xarelto, and denies any bleeding or complications. She uses Tylenol Arthritis 2 BID, which controls her pain (feet, hands, back).  Gout: She hasn't had any recurrent gout of her great toe, on her current dose of allopurinol (231m). Uric acid increased from 4.5 to 6.1 when HCTZ was initially started, but hadn't had any flares.  Recheck was lower. Denies flares since HCTZ was restarted Lab Results  Component Value Date   LABURIC 5.1 01/04/2019   Hyperlipidemia: Compliant with 429mof pravastatin without side effects.She hasn't  tolerated fish oil in the past (due to belching).She tries to follow a lowfat, low cholesterol diet.Ordering desserts 2x/week (sometimes doesn't eat it all at once, stretches it out).  She does eat some fried fish.  Urge urinary incontinence--TakingDetrol LA4m42mMost of the time it works well.  Occasionally has a night where she is up every 2 hours at night (no frequency during the day).  She usually gets up just 2-3x/night.  She does report some dry mouth. Husband is having nosebleeds--they started using humidifiers, and this has helped some.  Denies constipation.Denies any dysuria, odor or hematuria. She wearsPoise pads daily, due to the urgencyand incontinence if she is notclose enough to the bathroom..  Marland KitchenGERD--She has reflux about once a month, and will take either Prilosec or Tums, or both.  Food served isn't spicy, and eats at 6pm. Denies dysphagia.    Immunization History  Administered Date(s) Administered  . Fluad Quad(high Dose 65+) 01/04/2019  . Hepatitis A 11/06/1995, 08/25/1996  . IPV 11/06/1995  . Influenza Split 02/02/2011, 02/02/2012, 01/19/2013  . Influenza, High Dose Seasonal PF 02/24/2016, 02/01/2017, 02/17/2018  . Influenza-Unspecified 01/16/2014, 02/02/2015  . Meningococcal Polysaccharide 11/06/1995  . Moderna SARS-COVID-2 Vaccination 05/08/2019, 06/05/2019  . Pneumococcal Conjugate-13 05/01/2013  . Pneumococcal Polysaccharide-23 11/06/1995, 06/04/2005, 06/15/2016  . Tdap 11/06/1995, 06/04/2005, 06/16/2013, 12/06/2014  . Typhoid Live 11/15/1995, 04/29/2010, 06/10/2015  . Yellow Fever 04/04/2012  . Zoster 06/04/2005  . Zoster Recombinat (Shingrix) 08/20/2017, 11/18/2017   Last Pap smear: 6/09  Last mammogram:03/2019 Last colonoscopy: 12/2008  Last DEXA:09/2015 T-1.1 L fem  neck Dentist: regular, 1-2 times/year(every 9 months), though didn't go related to COVID. Plans to go Ophtho: yearly  Exercise:walking 30 minutes daily.  Not doing any  weight-bearing exercise   Other doctors caring for patient include: Ophtho: Dr. [place] Dentist: Dr.Pope Oncologist: Dr. Humphrey Rolls (she has been released from her care) Derm:Dr. Derrel Nip GI: Dr. Wynetta Emery Podiatrist: Dr. Cannon Kettle Cardiologists: Dr. Tamala Julian, Dr. Rayann Heman   Depression screen: Negative Fall screen: 1 (tripped over cart, injured lower legs, no fractures) Functional status screen: uses hearing aids (working well);trouble with remembering names, chronic/unchanged; occasional knee pain with stairs only. Some urge incontinence Mini-Cog screen normal. See full questionnaires in epic.  End of Life Discussion: Patient hasa living will and medical power of attorney   PMH, PSH, SH and FH were reviewed and updated  Outpatient Encounter Medications as of 07/12/2019  Medication Sig Note  . acetaminophen (TYLENOL) 650 MG CR tablet Take 1,300 mg by mouth 2 (two) times daily.   Marland Kitchen allopurinol (ZYLOPRIM) 100 MG tablet TAKE TWO TABLETS BY MOUTH DAILY   . calcium carbonate (TUMS - DOSED IN MG ELEMENTAL CALCIUM) 500 MG chewable tablet Chew 1 tablet by mouth daily as needed for indigestion or heartburn. Reported on 07/10/2015 11/28/2015: Uses prn heartburn, every few months  . Calcium Carbonate-Vitamin D (CALTRATE 600+D) 600-400 MG-UNIT per tablet Take 1 tablet by mouth daily.    Marland Kitchen diltiazem (CARDIZEM CD) 240 MG 24 hr capsule Take 1 capsule (240 mg total) by mouth daily.   . hydrochlorothiazide (HYDRODIURIL) 25 MG tablet Take 1 tablet (25 mg total) by mouth daily.   . metoprolol tartrate (LOPRESSOR) 25 MG tablet Take 1 tablet (25 mg total) by mouth 2 (two) times daily.   . Multiple Vitamins-Minerals (CENTRUM SILVER PO) Take 1 tablet by mouth daily.     . pravastatin (PRAVACHOL) 40 MG tablet Take 1 tablet (40 mg total) by mouth daily.   . Rivaroxaban (XARELTO) 15 MG TABS tablet TAKE ONE TABLET BY MOUTH DAILY WITH SUPPER.   Marland Kitchen tolterodine (DETROL LA) 4 MG 24 hr capsule TAKE ONE CAPSULE BY MOUTH  DAILY   . colchicine 0.6 MG tablet Use as directed. Take two tablets by mouth at start of gout flare up. Then one hour later take one tablet by mouth.   . furosemide (LASIX) 20 MG tablet Take 1 tablet (20 mg total) by mouth as needed. (Patient not taking: Reported on 07/12/2019)   . omeprazole (PRILOSEC) 20 MG capsule Take 1 capsule (20 mg total) by mouth daily. Reported on 05/23/2015 (Patient not taking: Reported on 07/12/2019) 06/15/2016: Uses prn only, rarely   No facility-administered encounter medications on file as of 07/12/2019.   Allergies  Allergen Reactions  . Cephalexin Itching  . Ciprofloxacin Itching  . Diclofenac Nausea Only    Upset stomach  . Shellfish-Derived Products Nausea And Vomiting    ROS: The patient denies anorexia, fever, weight changes, headaches, vision changes, ear pain, sore throat, breast concerns, chest pain, dizziness, syncope, dyspnea on exertion, cough, swelling, nausea, vomiting, diarrhea, constipation, melena, hematochezia, indigestion/heartburn (infrequent, per HPI), hematuria, dysuria, vaginal bleeding, discharge, odor or itch, genital lesions, weakness, tremor, suspicious skin lesions, depression, anxiety, abnormal bleeding/bruising, or enlarged lymph nodes.  +urge urinary incontinence (see HPI). +joint pains in hands/fingers/feet--treated with tylenol, per HPI; slight knee pain with stairs, intermittent +hearing loss--has hearing aids, working well   PHYSICAL EXAM:  BP (!) 120/58   Pulse 60   Temp 98.6 F (37 C) (Other (Comment))   Ht 5' (  1.524 m)   Wt 158 lb (71.7 kg)   BMI 30.86 kg/m   Wt Readings from Last 3 Encounters:  07/12/19 158 lb (71.7 kg)  06/12/19 151 lb (68.5 kg)  05/30/19 158 lb 1.9 oz (71.7 kg)     General Appearance:  Alert, cooperative, no distress, appears stated age   Head:  Normocephalic, without obvious abnormality, atraumatic   Eyes:  PERRL, conjunctiva/corneas clear, EOM's intact, fundi benign   Ears:   Normal external ears.  nonobsturctive cerumen noted on the right. Left TM and EAC is normal  Nose:  Not examined, wearing mask due to COVID-19 pandemic  Throat:  Not examined, wearing mask due to COVID-19 pandemic   Neck:  Supple, no lymphadenopathy; thyroid: no enlargement/ tenderness/nodules; no carotid bruit or JVD   Back:  Spine nontender, no curvature, ROM normal, no CVA tenderness   Lungs:  Clear to auscultation bilaterally without wheezes, rales or ronchi; respirations unlabored   Chest Wall:  No tenderness or deformity   Heart:  Regular rate and rhythm, S1 and S2 normal, no murmur, rub or gallop.  Breast Exam:  R breast absent, well healed surgical scars, nontender. Egg-sized soft tissue, cystic-swelling at proximal part of right pectoralis tendon (below proximal humerus), soft, mobile, nontender (approx 3 x 4cm).  This is unchanged. L breast normal-- no masses or nipple discharge. nipple is mildly inverted, chronic. No axillary lymphadenopathy   Abdomen:  Soft, non-tender, nondistended, normoactive bowel sounds, no masses, no hepatosplenomegaly.   Genitalia:  Exam declined by patient.  Rectal:  Exam declined by patient  Extremities:  Kept shoes/socks on. No edema present.  She has 8 x 9 mm eschar at R shin.  There is some mild hyperpigmentation around it, and only minimal bruises remaining on LE's (very faint yellow).  There is no warmth, erythema or tenderness at or surrounding the scab/escar.  Pulses:  2+ and symmetric all extremities   Skin:  Skin color, texture, turgor normal. See above for slowly healing wound on R shin. Many angiomason trunk  Lymph nodes:  Cervical, supraclavicular, and axillary nodes normal   Neurologic:  CNII-XII intact, normal strength, sensation and gait; reflexes 2+ and symmetric throughout    Psych: Normal mood, affect, hygiene and grooming      Chemistry      Component  Value Date/Time   NA 144 07/04/2019 0856   NA 141 11/11/2012 1020   K 3.8 07/04/2019 0856   K 4.1 11/11/2012 1020   CL 102 07/04/2019 0856   CO2 28 07/04/2019 0856   CO2 26 11/11/2012 1020   BUN 23 07/04/2019 0856   BUN 31.6 (H) 11/11/2012 1020   CREATININE 1.34 (H) 07/04/2019 0856   CREATININE 1.16 (H) 12/08/2016 0755   CREATININE 1.5 (H) 11/11/2012 1020      Component Value Date/Time   CALCIUM 10.1 07/04/2019 0856   CALCIUM 10.1 11/11/2012 1020   ALKPHOS 94 07/04/2019 0856   ALKPHOS 78 11/11/2012 1020   AST 21 07/04/2019 0856   AST 19 11/11/2012 1020   ALT 17 07/04/2019 0856   ALT 17 11/11/2012 1020   BILITOT 0.4 07/04/2019 0856   BILITOT 0.43 11/11/2012 1020     Fasting gluc 100  Lab Results  Component Value Date   WBC 6.2 07/04/2019   HGB 12.8 07/04/2019   HCT 37.8 07/04/2019   MCV 94 07/04/2019   PLT 183 07/04/2019   Lab Results  Component Value Date   CHOL 155 07/04/2019  HDL 38 (L) 07/04/2019   LDLCALC 75 07/04/2019   TRIG 257 (H) 07/04/2019   CHOLHDL 4.1 07/04/2019    ASSESSMENT/PLAN:  Annual physical exam  Mixed hyperlipidemia - LDL at goal on statin, but elevated TG.  Declines lovaza/vascepa. Reviewed diet in detail - Plan: pravastatin (PRAVACHOL) 40 MG tablet  Medicare annual wellness visit, subsequent  Essential hypertension, benign - wide fluctuations at home. Cardiology closely monitoring  Paroxysmal atrial fibrillation with rapid ventricular response ([place]) - improved since lopressor added. Continues on anticoagulants. Currently in NSR  Gout of big toe - no recent flares.  on allopurinol  Long term current use of anticoagulant therapy  Urge incontinence of urine - continue detrol  Wound of right lower extremity, subsequent encounter - has had only small improvement in the last 5 weeks--pt declined wound care referral. Antibacterial ointment, and contact us if not continuing to improve  RLE wound-- Decreased in size by 2-67m over the  last 5 weeks. She is very reluctant to get any further treatment for wounds. Declines referral Wound care reviewed, as well as s/sx infection.  Elevated TG, low HDL--reviewed diet in detail; declines trial of lovaza or vascepa. Cont pravastatin.  Elevated Cr, about the same as last check, after HCTZ added, though BUN improved.   GFR 35. Having some dry mouth.  Encouraged increased water intake.  Old records reviewed, baseline may be around Cr 1.3  F/u 6 month med check with fasting labs prior--CBC, c-met, lipid, uric acid   Discussed monthly self breast exams and yearly mammograms; at least 30 minutes of aerobic activity at least 5 days/week; weight-bearing exercise at least 2x/wk; proper sunscreen use reviewed; healthy diet, including goals of calcium and vitamin D intake and alcohol recommendations (less than or equal to 1 drink/day) reviewed; regular seatbelt use; changing batteries in smoke detectors. Immunization recommendations discussed--UTD, continue yearly high dose flu shots. Colonoscopy recommendations reviewed--last done 12/2008. F/u needed due to age, unless problems. Pap smear not indicated due to age and low risk.  (DEXA--considerrepeatingin the next few years)  MOST form was reviewed and updated.  Full Code, full care.    Medicare Attestation I have personally reviewed: The patient's medical and social history Their use of alcohol, tobacco or illicit drugs Their current medications and supplements The patient's functional ability including ADLs,fall risks, home safety risks, cognitive, and hearing and visual impairment Diet and physical activities Evidence for depression or mood disorders  The patient's weight, height, BMI, and visual acuity have been recorded in the chart.  I have made referrals, counseling, and provided education to the patient based on review of the above and I have provided the patient with a written personalized care plan for preventive services.

## 2019-07-11 NOTE — Patient Instructions (Addendum)
HEALTH MAINTENANCE RECOMMENDATIONS:  It is recommended that you get at least 30 minutes of aerobic exercise at least 5 days/week (for weight loss, you may need as much as 60-90 minutes). This can be any activity that gets your heart rate up. This can be divided in 10-15 minute intervals if needed, but try and build up your endurance at least once a week.  Weight bearing exercise is also recommended twice weekly.  Eat a healthy diet with lots of vegetables, fruits and fiber.  "Colorful" foods have a lot of vitamins (ie green vegetables, tomatoes, red peppers, etc).  Limit sweet tea, regular sodas and alcoholic beverages, all of which has a lot of calories and sugar.  Up to 1 alcoholic drink daily may be beneficial for women (unless trying to lose weight, watch sugars).  Drink a lot of water.  Calcium recommendations are 1200-1500 mg daily (1500 mg for postmenopausal women or women without ovaries), and vitamin D 1000 IU daily.  This should be obtained from diet and/or supplements (vitamins), and calcium should not be taken all at once, but in divided doses.  Monthly self breast exams and yearly mammograms for women over the age of 64 is recommended.  Sunscreen of at least SPF 30 should be used on all sun-exposed parts of the skin when outside between the hours of 10 am and 4 pm (not just when at beach or pool, but even with exercise, golf, tennis, and yard work!)  Use a sunscreen that says "broad spectrum" so it covers both UVA and UVB rays, and make sure to reapply every 1-2 hours.  Remember to change the batteries in your smoke detectors when changing your clock times in the spring and fall. Carbon monoxide detectors are recommended for your home.  Use your seat belt every time you are in a car, and please drive safely and not be distracted with cell phones and texting while driving.   Connie [name] , Thank you for taking time to come for your Medicare Wellness Visit. I appreciate your ongoing  commitment to your health goals. Please review the following plan we discussed and let me know if I can assist you in the future.    This is a list of the screening recommended for you and due dates:  Health Maintenance  Topic Date Due  . Tetanus Vaccine  12/05/2024  . Flu Shot  Completed  . DEXA scan (bone density measurement)  Completed  . Pneumonia vaccines  Completed   Continue yearly mammograms.  Try and drink more water.  Cut back on sweets, carbs and sugar in your diet as well as limiting fried foods. Weight loss is recommended and encouraged.   High Triglycerides Eating Plan Triglycerides are a type of fat in the blood. High levels of triglycerides can increase your risk of heart disease and stroke. If your triglyceride levels are high, choosing the right foods can help lower your triglycerides and keep your heart healthy. Work with your health care provider or a diet and nutrition specialist (dietitian) to develop an eating plan that is right for you. What are tips for following this plan? General guidelines   Lose weight, if you are overweight. For most people, losing 5-10 lbs (2-5 kg) helps lower triglyceride levels. A weight-loss plan may include. ? 30 minutes of exercise at least 5 days a week. ? Reducing the amount of calories, sugar, and fat you eat.  Eat a wide variety of fresh fruits, vegetables, and whole grains. These  foods are high in fiber.  Eat foods that contain healthy fats, such as fatty fish, nuts, seeds, and olive oil.  Avoid foods that are high in added sugar, added salt (sodium), saturated fat, and trans fat.  Avoid low-fiber, refined carbohydrates such as white bread, crackers, noodles, and white rice.  Avoid foods with partially hydrogenated oils (trans fats), such as fried foods or stick margarine.  Limit alcohol intake to no more than 1 drink a day for nonpregnant women and 2 drinks a day for men. One drink equals 12 oz of beer, 5 oz of wine, or  1 oz of hard liquor. Your health care provider may recommend that you drink less depending on your overall health. Reading food labels  Check food labels for the amount of saturated fat. Choose foods with no or very little saturated fat.  Check food labels for the amount of trans fat. Choose foods with no trans fat.  Check food labels for the amount of cholesterol. Choose foods low in cholesterol. Ask your dietitian how much cholesterol you should have each day.  Check food labels for the amount of sodium. Choose foods with less than 140 milligrams (mg) per serving. Shopping  Buy dairy products labeled as nonfat (skim) or low-fat (1%).  Avoid buying processed or prepackaged foods. These are often high in added sugar, sodium, and fat. Cooking  Choose healthy fats when cooking, such as olive oil or canola oil.  Cook foods using lower fat methods, such as baking, broiling, boiling, or grilling.  Make your own sauces, dressings, and marinades when possible, instead of buying them. Store-bought sauces, dressings, and marinades are often high in sodium and sugar. Meal planning  Eat more home-cooked food and less restaurant, buffet, and fast food.  Eat fatty fish at least 2 times each week. Examples of fatty fish include salmon, trout, mackerel, tuna, and herring.  If you eat whole eggs, do not eat more than 3 egg yolks per week. What foods are recommended? The items listed may not be a complete list. Talk with your dietitian about what dietary choices are best for you. Grains Whole wheat or whole grain breads, crackers, cereals, and pasta. Unsweetened oatmeal. Bulgur. Barley. Quinoa. Brown rice. Whole wheat flour tortillas. Vegetables Fresh or frozen vegetables. Low-sodium canned vegetables. Fruits All fresh, canned (in natural juice), or frozen fruits. Meats and other protein foods Skinless chicken or [place]. Ground chicken or [place]. Lean cuts of pork, trimmed of fat. Fish and  seafood, especially salmon, trout, and herring. Egg whites. Dried beans, peas, or lentils. Unsalted nuts or seeds. Unsalted canned beans. Natural peanut or almond butter. Dairy Low-fat dairy products. Skim or low-fat (1%) milk. Reduced fat (2%) and low-sodium cheese. Low-fat ricotta cheese. Low-fat cottage cheese. Plain, low-fat yogurt. Fats and oils Tub margarine without trans fats. Light or reduced-fat mayonnaise. Light or reduced-fat salad dressings. Avocado. Safflower, olive, sunflower, soybean, and canola oils. What foods are not recommended? The items listed may not be a complete list. Talk with your dietitian about what dietary choices are best for you. Grains White bread. White (regular) pasta. White rice. Cornbread. Bagels. Pastries. Crackers that contain trans fat. Vegetables Creamed or fried vegetables. Vegetables in a cheese sauce. Fruits Sweetened dried fruit. Canned fruit in syrup. Fruit juice. Meats and other protein foods Fatty cuts of meat. Ribs. Chicken wings. Berniece Salines. Sausage. Bologna. Salami. Chitterlings. Fatback. Hot dogs. Bratwurst. Packaged lunch meats. Dairy Whole or reduced-fat (2%) milk. Half-and-half. Cream cheese. Full-fat or sweetened yogurt.  Full-fat cheese. Nondairy creamers. Whipped toppings. Processed cheese or cheese spreads. Cheese curds. Beverages Alcohol. Sweetened drinks, such as soda, lemonade, fruit drinks, or punches. Fats and oils Butter. Stick margarine. Lard. Shortening. Ghee. Bacon fat. Tropical oils, such as coconut, palm kernel, or palm oils. Sweets and desserts Corn syrup. Sugars. Honey. Molasses. Candy. Jam and jelly. Syrup. Sweetened cereals. Cookies. Pies. Cakes. Donuts. Muffins. Ice cream. Condiments Store-bought sauces, dressings, and marinades that are high in sugar, such as ketchup and barbecue sauce. Summary  High levels of triglycerides can increase the risk of heart disease and stroke. Choosing the right foods can help lower your  triglycerides.  Eat plenty of fresh fruits, vegetables, and whole grains. Choose low-fat dairy and lean meats. Eat fatty fish at least twice a week.  Avoid processed and prepackaged foods with added sugar, sodium, saturated fat, and trans fat.  If you need suggestions or have questions about what types of food are good for you, talk with your health care provider or a dietitian. This information is not intended to replace advice given to you by your health care provider. Make sure you discuss any questions you have with your health care provider. Document Revised: 04/02/2017 Document Reviewed: 06/23/2016 Elsevier Patient Education  2020 [employer].

## 2019-07-12 ENCOUNTER — Telehealth: Payer: Self-pay

## 2019-07-12 ENCOUNTER — Other Ambulatory Visit: Payer: Self-pay

## 2019-07-12 ENCOUNTER — Encounter: Payer: Self-pay | Admitting: Family Medicine

## 2019-07-12 ENCOUNTER — Ambulatory Visit (INDEPENDENT_AMBULATORY_CARE_PROVIDER_SITE_OTHER): Payer: Medicare HMO | Admitting: Family Medicine

## 2019-07-12 VITALS — BP 120/58 | HR 60 | Temp 98.6°F | Ht 60.0 in | Wt 158.0 lb

## 2019-07-12 DIAGNOSIS — E782 Mixed hyperlipidemia: Secondary | ICD-10-CM | POA: Diagnosis not present

## 2019-07-12 DIAGNOSIS — I10 Essential (primary) hypertension: Secondary | ICD-10-CM

## 2019-07-12 DIAGNOSIS — S81801D Unspecified open wound, right lower leg, subsequent encounter: Secondary | ICD-10-CM | POA: Diagnosis not present

## 2019-07-12 DIAGNOSIS — Z5181 Encounter for therapeutic drug level monitoring: Secondary | ICD-10-CM

## 2019-07-12 DIAGNOSIS — Z0000 Encounter for general adult medical examination without abnormal findings: Secondary | ICD-10-CM | POA: Diagnosis not present

## 2019-07-12 DIAGNOSIS — I480 Paroxysmal atrial fibrillation: Secondary | ICD-10-CM | POA: Diagnosis not present

## 2019-07-12 DIAGNOSIS — Z7901 Long term (current) use of anticoagulants: Secondary | ICD-10-CM

## 2019-07-12 DIAGNOSIS — M109 Gout, unspecified: Secondary | ICD-10-CM | POA: Diagnosis not present

## 2019-07-12 DIAGNOSIS — N3941 Urge incontinence: Secondary | ICD-10-CM | POA: Diagnosis not present

## 2019-07-12 MED ORDER — PRAVASTATIN SODIUM 40 MG PO TABS: 40.0000 mg | ORAL_TABLET | Freq: Every day | ORAL | 1 refills | Status: DC

## 2019-07-12 NOTE — Telephone Encounter (Signed)
-----  Message from Burtis Junes, NP sent at 07/12/2019  2:48 PM EST ----- Thanks so much! This does look better.   We have our triage staff let her know that she does not need to come to our office tomorrow.   Thanks, Cecille Rubin ----- Message ----- From: Rita Ohara, MD Sent: 07/12/2019   2:29 PM EST To: Burtis Junes, NP  I saw Mrs. Wessels today, and had c-met done with her routine labs. I know she is scheduled for a b-met at your office tomorrow. She will be calling to see if you really need her to come for this. We did discuss her increasing her water intake.  BUN is better. I think her Creatinine seems to be baseline around 1.3 (though occasionally better) in looking back at prior labs.  We discussed her TG as well today, not interested in Dexter or other med changes, but will try and work on her diet.  ----- Message ----- From: Interface, [facility] Lab Results In Sent: 07/04/2019   9:35 PM EST To: Rita Ohara, MD

## 2019-07-12 NOTE — Telephone Encounter (Signed)
I called the patient's spouse and informed him of Lori's recommendation.  He verbalized understanding.  I cancelled lab appointment for 3/11.

## 2019-07-13 ENCOUNTER — Other Ambulatory Visit: Payer: Medicare HMO

## 2019-08-15 NOTE — Progress Notes (Signed)
Cardiology Office Note:    Date:  08/16/2019   ID:  Connie [name], DOB 12/15/1927, MRN [id number]  PCP:  Rita Ohara, MD  Cardiologist:  Sinclair Grooms, MD   Referring MD: Rita Ohara, MD   Chief Complaint  Patient presents with  . Atrial Fibrillation  . Hypertension    History of Present Illness:    Connie [name] is a 86 y.o. female with a hx of hypertension, hyperlipidemia, paroxysmal atrial fibrillation on anticoagulation.   She feels better.  HCTZ 25 mg/day was started and early February by Truitt Merle.  Potassium and kidney function were evaluated and remain relatively stable.  Over the past 2 weeks she has felt somewhat lightheaded, weak, and fatigued.  She was having intermittent atrial fibrillation on occasion.  At other times, the symptoms persisted despite being in rhythm.  Her daughter is a physician and recommended that she decrease HCTZ to 12.5 mg/day.  [name] now feels better, feels atrial fibrillation episodes have significantly decreased, and she feels more like her old self.  Past Medical History:  Diagnosis Date  . Abnormal brain MRI 8/01   small hemorrhagic stroke and possible cavernous hemangioma (Dr. Erling Cruz)  . Adenomatous colon polyp 1/03  . Arthritis    OA spine, hands  . Breast cancer ([place]) 2001   R breast (T3N1, ER/PR+, HER-2 +) s/p mastectomy, chemo and chest wall irradiation (Dr. Truddie Coco)  . DDD (degenerative disc disease), lumbar   . GERD (gastroesophageal reflux disease)   . Hearing loss 2012   wears hearing aids  . Herpes zoster 2003  . Hyperlipidemia   . Hypertension   . Onychomycosis 2003, 2008   treated with Lamisil  . Osteopenia    (DEXA's done by Dr. Truddie Coco); prev took Fosamax.  No change in DEXA after off Fosamax x 2 years  . Ovarian cyst 12/2008   right  . Personal history of chemotherapy   . Personal history of radiation therapy   . Postmenopausal bleeding 2000   benign EMB (Dr. Raphael Gibney)  . Urge urinary  incontinence     Past Surgical History:  Procedure Laterality Date  . CATARACT EXTRACTION  summer 2011   bilateral  . CHOLECYSTECTOMY, LAPAROSCOPIC  1/05  . COLONOSCOPY  12/17/08   normal (Dr. Wynetta Emery)  . COLONOSCOPY W/ POLYPECTOMY  05/23/01   adenomatous polyp  . MASTECTOMY Right 2001  . modified mastectomy and tram flap reconstruction  2001   R breast  . TONSILLECTOMY    . tram flap removal     problems with flap after radiation  . TUBAL LIGATION      Current Medications: Current Meds  Medication Sig  . acetaminophen (TYLENOL) 650 MG CR tablet Take 1,300 mg by mouth 2 (two) times daily.  Marland Kitchen allopurinol (ZYLOPRIM) 100 MG tablet TAKE TWO TABLETS BY MOUTH DAILY  . calcium carbonate (TUMS - DOSED IN MG ELEMENTAL CALCIUM) 500 MG chewable tablet Chew 1 tablet by mouth daily as needed for indigestion or heartburn. Reported on 07/10/2015  . Calcium Carbonate-Vitamin D (CALTRATE 600+D) 600-400 MG-UNIT per tablet Take 1 tablet by mouth daily.   . colchicine 0.6 MG tablet Use as directed. Take two tablets by mouth at start of gout flare up. Then one hour later take one tablet by mouth.  . diltiazem (CARDIZEM CD) 240 MG 24 hr capsule Take 1 capsule (240 mg total) by mouth daily.  . furosemide (LASIX) 20 MG tablet Take 1 tablet (20 mg total) by  mouth as needed.  . hydrochlorothiazide (HYDRODIURIL) 25 MG tablet Take 12.5 mg by mouth daily.  . metoprolol tartrate (LOPRESSOR) 25 MG tablet Take 1 tablet (25 mg total) by mouth 2 (two) times daily.  . Multiple Vitamins-Minerals (CENTRUM SILVER PO) Take 1 tablet by mouth daily.    Marland Kitchen omeprazole (PRILOSEC) 20 MG capsule Take 1 capsule (20 mg total) by mouth daily. Reported on 05/23/2015  . pravastatin (PRAVACHOL) 40 MG tablet Take 1 tablet (40 mg total) by mouth daily.  . Rivaroxaban (XARELTO) 15 MG TABS tablet TAKE ONE TABLET BY MOUTH DAILY WITH SUPPER.  Marland Kitchen tolterodine (DETROL LA) 4 MG 24 hr capsule TAKE ONE CAPSULE BY MOUTH DAILY     Allergies:    Cephalexin, Ciprofloxacin, Diclofenac, and Shellfish-derived products   Social History   Socioeconomic History  . Marital status: Married    Spouse name: Not on file  . Number of children: 3  . Years of education: Not on file  . Highest education level: Not on file  Occupational History  . Not on file  Tobacco Use  . Smoking status: Never Smoker  . Smokeless tobacco: Never Used  Substance and Sexual Activity  . Alcohol use: No  . Drug use: No  . Sexual activity: Not Currently    Comment: due to poor libido and pain  Other Topics Concern  . Not on file  Social History Narrative   Moved 06/2018 to [address]. Lives with husband.   1 daughter at [facility]--hospitalist; daughter in [place] (works in Conservator, museum/gallery at [employer] took in Barrister's clerk), son in [place]. 8 grandchildren, 4 great-grandchildren      Updated 07/2019   Social Determinants of Health   Financial Resource Strain:   . Difficulty of Paying Living Expenses:   Food Insecurity:   . Worried About Charity fundraiser in the Last Year:   . Arboriculturist in the Last Year:   Transportation Needs:   . Film/video editor (Medical):   Marland Kitchen Lack of Transportation (Non-Medical):   Physical Activity:   . Days of Exercise per Week:   . Minutes of Exercise per Session:   Stress:   . Feeling of Stress :   Social Connections:   . Frequency of Communication with Friends and Family:   . Frequency of Social Gatherings with Friends and Family:   . Attends Religious Services:   . Active Member of Clubs or Organizations:   . Attends Archivist Meetings:   Marland Kitchen Marital Status:      Family History: The patient's family history includes Diabetes in her brother; Heart disease in her brother, father, and mother; Hypertension in her father and mother. There is no history of Drug abuse, Cancer, or Breast cancer.  ROS:   Please see the history of present illness.    She denies orthopnea, PND, and lower extremity swelling.   She has had no blood in her urine or stool.  No other particular medication side effects.  All other systems reviewed and are negative.  EKGs/Labs/Other Studies Reviewed:    The following studies were reviewed today: No new data  EKG:  EKG not repeated.  On 06/12/2019 the EKG demonstrates sinus bradycardia with first-degree AV block.  Recent Labs: 05/22/2019: Magnesium 2.3; TSH 2.840 07/04/2019: ALT 17; BUN 23; Creatinine, Ser 1.34; Hemoglobin 12.8; Platelets 183; Potassium 3.8; Sodium 144  Recent Lipid Panel    Component Value Date/Time   CHOL 155 07/04/2019 0856  TRIG 257 (H) 07/04/2019 0856   HDL 38 (L) 07/04/2019 0856   CHOLHDL 4.1 07/04/2019 0856   CHOLHDL 3.6 03/18/2017 1343   VLDL 41 (H) 12/08/2016 0755   LDLCALC 75 07/04/2019 0856   LDLCALC 84 03/18/2017 1343    Physical Exam:    VS:  BP (!) 176/66   Pulse 63   Ht 5' (1.524 m)   Wt 161 lb 12.8 oz (73.4 kg)   SpO2 96%   BMI 31.60 kg/m     Wt Readings from Last 3 Encounters:  08/16/19 161 lb 12.8 oz (73.4 kg)  07/12/19 158 lb (71.7 kg)  06/12/19 151 lb (68.5 kg)     GEN: Compatible with age or slightly younger.. No acute distress HEENT: Normal NECK: No JVD. LYMPHATICS: No lymphadenopathy CARDIAC:  RRR without murmur, gallop, or edema. VASCULAR:  Normal Pulses. No bruits. RESPIRATORY:  Clear to auscultation without rales, wheezing or rhonchi  ABDOMEN: Soft, non-tender, non-distended, No pulsatile mass, MUSCULOSKELETAL: No deformity  SKIN: Warm and dry NEUROLOGIC:  Alert and oriented x 3 PSYCHIATRIC:  Normal affect   ASSESSMENT:    1. PAF (paroxysmal atrial fibrillation) ([place])   2. Essential hypertension   3. Chronic anticoagulation   4. Pure hypercholesterolemia   5. Educated about COVID-19 virus infection    PLAN:    In order of problems listed above:  1. Controlled with fewer instances of what are felt to be atrial fibrillation.  Clinically in sinus rhythm today. 2. Relatively elevated systolic  blood pressure as noted.  Concurrent home monitoring is significantly better with blood pressures on 25 mg of hydrochlorothiazide are tending to run below 340 mmHg systolic.  On 12-1/2 mg she has been running between 130 and 160 mmHg.  Low-salt diet.  Continue to monitor the blood pressure.  Target range 130 to 150 mmHg. 3. Continue Xarelto.  Monitor for bleeding.  Rivaroxaban 15 mg/day seems appropriate dose based on kidney function age and weight. 4. We did not discuss lipids today.  The most recent total cholesterol was 155. 5. She has received both doses of the COVID-19 vaccine.  Continue social distancing and mask wearing.   Medication Adjustments/Labs and Tests Ordered: Current medicines are reviewed at length with the patient today.  Concerns regarding medicines are outlined above.  No orders of the defined types were placed in this encounter.  No orders of the defined types were placed in this encounter.   Patient Instructions  Medication Instructions:  Your physician recommends that you continue on your current medications as directed. Please refer to the Current Medication list given to you today.  *If you need a refill on your cardiac medications before your next appointment, please call your pharmacy*   Lab Work: None If you have labs (blood work) drawn today and your tests are completely normal, you will receive your results only by: Marland Kitchen MyChart Message (if you have MyChart) OR . A paper copy in the mail If you have any lab test that is abnormal or we need to change your treatment, we will call you to review the results.   Testing/Procedures: None   Follow-Up: At [facility], you and your health needs are our priority.  As part of our continuing mission to provide you with exceptional heart care, we have created designated Provider Care Teams.  These Care Teams include your primary Cardiologist (physician) and Advanced Practice Providers (APPs -  Physician Assistants  and Nurse Practitioners) who all work together to provide you  with the care you need, when you need it.  We recommend signing up for the patient portal called "MyChart".  Sign up information is provided on this After Visit Summary.  MyChart is used to connect with patients for Virtual Visits (Telemedicine).  Patients are able to view lab/test results, encounter notes, upcoming appointments, etc.  Non-urgent messages can be sent to your provider as well.   To learn more about what you can do with MyChart, go to [email or web address].    Your next appointment:   6 month(s)  The format for your next appointment:   In Person  Provider:   You may see Sinclair Grooms, MD or one of the following Advanced Practice Providers on your designated Care Team:    Truitt Merle, NP  Cecilie Kicks, NP  Kathyrn Drown, NP    Other Instructions      Signed, Sinclair Grooms, MD  08/16/2019 1:55 PM    [place]

## 2019-08-16 ENCOUNTER — Ambulatory Visit: Payer: Medicare HMO | Admitting: Interventional Cardiology

## 2019-08-16 ENCOUNTER — Other Ambulatory Visit: Payer: Self-pay

## 2019-08-16 ENCOUNTER — Encounter: Payer: Self-pay | Admitting: Interventional Cardiology

## 2019-08-16 VITALS — BP 176/66 | HR 63 | Ht 60.0 in | Wt 161.8 lb

## 2019-08-16 DIAGNOSIS — E7800 Pure hypercholesterolemia, unspecified: Secondary | ICD-10-CM | POA: Diagnosis not present

## 2019-08-16 DIAGNOSIS — I10 Essential (primary) hypertension: Secondary | ICD-10-CM

## 2019-08-16 DIAGNOSIS — Z7901 Long term (current) use of anticoagulants: Secondary | ICD-10-CM | POA: Diagnosis not present

## 2019-08-16 DIAGNOSIS — I480 Paroxysmal atrial fibrillation: Secondary | ICD-10-CM | POA: Diagnosis not present

## 2019-08-16 DIAGNOSIS — Z7189 Other specified counseling: Secondary | ICD-10-CM | POA: Diagnosis not present

## 2019-08-16 NOTE — Patient Instructions (Signed)

## 2019-08-21 ENCOUNTER — Telehealth: Payer: Self-pay

## 2019-08-21 NOTE — Telephone Encounter (Signed)
Already abstracted.

## 2019-08-21 NOTE — Telephone Encounter (Signed)
Pt. Called stating that she just wanted to let us know that she has received both covid vaccines. She received a call form someone with [facility] stated they did not have on file that she had any covid vaccines. She said she got the Moderna vaccine on 05/08/19 and the 2nd shot on 06/05/19.

## 2019-09-04 ENCOUNTER — Telehealth: Payer: Self-pay

## 2019-09-04 MED ORDER — HYDROCHLOROTHIAZIDE 12.5 MG PO CAPS: 12.5000 mg | ORAL_CAPSULE | Freq: Every day | ORAL | 3 refills | Status: DC

## 2019-09-04 NOTE — Telephone Encounter (Signed)
Spoke with pt and advised I can send in the 12.5mg  capsules.  Pt appreciative for assistance.

## 2019-09-04 NOTE — Telephone Encounter (Signed)
Pt calling stating that she would like Dr. Tamala Julian to send in a Rx for her medication Hydrochlorothiazide 12.5 mg, because pt stated she does not like to cut the tablets. Pt would like a call concerning this matter. Please address

## 2019-09-11 DIAGNOSIS — R69 Illness, unspecified: Secondary | ICD-10-CM | POA: Diagnosis not present

## 2019-09-12 ENCOUNTER — Encounter: Payer: Self-pay | Admitting: Sports Medicine

## 2019-09-12 ENCOUNTER — Ambulatory Visit: Payer: Medicare HMO | Admitting: Sports Medicine

## 2019-09-12 ENCOUNTER — Other Ambulatory Visit: Payer: Self-pay

## 2019-09-12 VITALS — Temp 97.4°F

## 2019-09-12 DIAGNOSIS — M79672 Pain in left foot: Secondary | ICD-10-CM

## 2019-09-12 DIAGNOSIS — M79675 Pain in left toe(s): Secondary | ICD-10-CM

## 2019-09-12 DIAGNOSIS — M79674 Pain in right toe(s): Secondary | ICD-10-CM

## 2019-09-12 DIAGNOSIS — B351 Tinea unguium: Secondary | ICD-10-CM

## 2019-09-12 DIAGNOSIS — Z7901 Long term (current) use of anticoagulants: Secondary | ICD-10-CM | POA: Diagnosis not present

## 2019-09-12 DIAGNOSIS — L84 Corns and callosities: Secondary | ICD-10-CM

## 2019-09-12 DIAGNOSIS — M79671 Pain in right foot: Secondary | ICD-10-CM | POA: Diagnosis not present

## 2019-09-12 NOTE — Progress Notes (Addendum)
Subjective: Connie [name] is a 86 y.o. female patient seen today in office with complaint of painful thickened and elongated toenails and callus; unable to trim. Patient denies changes with medical history; still on Xalerto for Afib like before.  Patient has no other pedal complaints at this time.   Patient Active Problem List   Diagnosis Date Noted  . Chronic anticoagulation 05/11/2016  . Atrial fibrillation with rapid ventricular response ([place]) 06/24/2015  . Gout 10/30/2013  . Gout of big toe 10/30/2013  . Osteoarthritis of multiple joints 10/30/2013  . Breast cancer, right breast ([place]) 11/25/2012  . Urge incontinence of urine 04/24/2011  . OA (osteoarthritis) 04/24/2011  . Essential hypertension, benign 10/23/2010  . Pure hypercholesterolemia 10/23/2010    Current Outpatient Medications on File Prior to Visit  Medication Sig Dispense Refill  . acetaminophen (TYLENOL) 650 MG CR tablet Take 1,300 mg by mouth 2 (two) times daily.    Marland Kitchen allopurinol (ZYLOPRIM) 100 MG tablet TAKE TWO TABLETS BY MOUTH DAILY 180 tablet 0  . calcium carbonate (TUMS - DOSED IN MG ELEMENTAL CALCIUM) 500 MG chewable tablet Chew 1 tablet by mouth daily as needed for indigestion or heartburn. Reported on 07/10/2015    . Calcium Carbonate-Vitamin D (CALTRATE 600+D) 600-400 MG-UNIT per tablet Take 1 tablet by mouth daily.     . colchicine 0.6 MG tablet Use as directed. Take two tablets by mouth at start of gout flare up. Then one hour later take one tablet by mouth.    . furosemide (LASIX) 20 MG tablet Take 1 tablet (20 mg total) by mouth as needed. 30 tablet 6  . hydrochlorothiazide (MICROZIDE) 12.5 MG capsule Take 1 capsule (12.5 mg total) by mouth daily. 90 capsule 3  . Multiple Vitamins-Minerals (CENTRUM SILVER PO) Take 1 tablet by mouth daily.      Marland Kitchen omeprazole (PRILOSEC) 20 MG capsule Take 1 capsule (20 mg total) by mouth daily. Reported on 05/23/2015    . pravastatin (PRAVACHOL) 40 MG tablet Take 1 tablet  (40 mg total) by mouth daily. 90 tablet 1  . Rivaroxaban (XARELTO) 15 MG TABS tablet TAKE ONE TABLET BY MOUTH DAILY WITH SUPPER. 90 tablet 3  . tolterodine (DETROL LA) 4 MG 24 hr capsule TAKE ONE CAPSULE BY MOUTH DAILY 90 capsule 0  . diltiazem (CARDIZEM CD) 240 MG 24 hr capsule Take 1 capsule (240 mg total) by mouth daily. 90 capsule 3  . metoprolol tartrate (LOPRESSOR) 25 MG tablet Take 1 tablet (25 mg total) by mouth 2 (two) times daily. 180 tablet 3   No current facility-administered medications on file prior to visit.    Allergies  Allergen Reactions  . Cephalexin Itching  . Ciprofloxacin Itching  . Diclofenac Nausea Only    Upset stomach  . Shellfish-Derived Products Nausea And Vomiting    Objective: Physical Exam  General: Well developed, nourished, no acute distress, awake, alert and oriented x 3  Vascular: Dorsalis pedis artery 1/4 bilateral, Posterior tibial artery 0/4 bilateral, skin temperature warm to warm proximal to distal bilateral lower extremities, trace edema at ankles bilateral, pedal hair present bilateral.  Neurological: Gross sensation present via light touch bilateral.   Dermatological: Skin is warm, dry, and supple bilateral, Nails 1-10 are tender, long, thick, and discolored with mild subungal debris, no webspace macerations present bilateral, no open lesions present bilateral, minimal reactive keratosis medial aspect of right bunion greater than plantar left forefoot. No signs of infection bilateral.  Abrasion to anterior shin on the  right that appears to be healing well without any issues.  Musculoskeletal: Asymptomatic bunion boney deformities noted bilateral. Muscular strength within normal limits without painon range of motion. No pain with calf compression bilateral.   Assessment and Plan:  Problem List Items Addressed This Visit    None    Visit Diagnoses    Pain due to onychomycosis of toenails of both feet    -  Primary   Callus of foot        Foot pain, bilateral       Anticoagulant long-term use         -Examined patient.  -Re-discussed treatment options for painful mycotic nails  -Mechanically debrided and reduced mycotic nails with sterile nail nipper and dremel nail file without incident and at no additional smooth callus plantar right foot using rotary bur -Continue with good supportive shoes for foot type -Patient to return in 2.5 to 3 months for follow up evaluation or sooner if symptoms worsen.  Landis Martins, DPM

## 2019-09-25 ENCOUNTER — Other Ambulatory Visit: Payer: Self-pay | Admitting: Family Medicine

## 2019-09-27 ENCOUNTER — Telehealth: Payer: Self-pay

## 2019-09-27 NOTE — Telephone Encounter (Signed)
I submitted a PA for the pts. Tolterodine Tartrate by phone and it was approved from now until the end of the year 05/03/20.

## 2019-11-21 ENCOUNTER — Ambulatory Visit: Payer: Medicare HMO | Admitting: Sports Medicine

## 2019-11-21 ENCOUNTER — Encounter: Payer: Self-pay | Admitting: Sports Medicine

## 2019-11-21 ENCOUNTER — Other Ambulatory Visit: Payer: Self-pay

## 2019-11-21 DIAGNOSIS — M79674 Pain in right toe(s): Secondary | ICD-10-CM

## 2019-11-21 DIAGNOSIS — Z7901 Long term (current) use of anticoagulants: Secondary | ICD-10-CM

## 2019-11-21 DIAGNOSIS — M79672 Pain in left foot: Secondary | ICD-10-CM

## 2019-11-21 DIAGNOSIS — M79675 Pain in left toe(s): Secondary | ICD-10-CM | POA: Diagnosis not present

## 2019-11-21 DIAGNOSIS — L84 Corns and callosities: Secondary | ICD-10-CM

## 2019-11-21 DIAGNOSIS — B351 Tinea unguium: Secondary | ICD-10-CM

## 2019-11-21 DIAGNOSIS — M79671 Pain in right foot: Secondary | ICD-10-CM

## 2019-11-21 NOTE — Progress Notes (Signed)
Subjective: Connie [name] is a 86 y.o. female patient seen today in office with complaint of painful thickened and elongated toenails and callus; unable to trim. Patient denies changes with medical history; still on Xalerto for Afib like before without any changes.  Patient has no other pedal complaints at this time.   Patient Active Problem List   Diagnosis Date Noted  . Chronic anticoagulation 05/11/2016  . Atrial fibrillation with rapid ventricular response ([place]) 06/24/2015  . Gout 10/30/2013  . Gout of big toe 10/30/2013  . Osteoarthritis of multiple joints 10/30/2013  . Breast cancer, right breast ([place]) 11/25/2012  . Urge incontinence of urine 04/24/2011  . OA (osteoarthritis) 04/24/2011  . Essential hypertension, benign 10/23/2010  . Pure hypercholesterolemia 10/23/2010    Current Outpatient Medications on File Prior to Visit  Medication Sig Dispense Refill  . acetaminophen (TYLENOL) 650 MG CR tablet Take 1,300 mg by mouth 2 (two) times daily.    Marland Kitchen allopurinol (ZYLOPRIM) 100 MG tablet TAKE TWO TABLETS BY MOUTH DAILY 180 tablet 0  . calcium carbonate (TUMS - DOSED IN MG ELEMENTAL CALCIUM) 500 MG chewable tablet Chew 1 tablet by mouth daily as needed for indigestion or heartburn. Reported on 07/10/2015    . Calcium Carbonate-Vitamin D (CALTRATE 600+D) 600-400 MG-UNIT per tablet Take 1 tablet by mouth daily.     . colchicine 0.6 MG tablet Use as directed. Take two tablets by mouth at start of gout flare up. Then one hour later take one tablet by mouth.    . furosemide (LASIX) 20 MG tablet Take 1 tablet (20 mg total) by mouth as needed. 30 tablet 6  . hydrochlorothiazide (MICROZIDE) 12.5 MG capsule Take 1 capsule (12.5 mg total) by mouth daily. 90 capsule 3  . Multiple Vitamins-Minerals (CENTRUM SILVER PO) Take 1 tablet by mouth daily.      Marland Kitchen omeprazole (PRILOSEC) 20 MG capsule Take 1 capsule (20 mg total) by mouth daily. Reported on 05/23/2015    . pravastatin (PRAVACHOL) 40 MG  tablet Take 1 tablet (40 mg total) by mouth daily. 90 tablet 1  . Rivaroxaban (XARELTO) 15 MG TABS tablet TAKE ONE TABLET BY MOUTH DAILY WITH SUPPER. 90 tablet 3  . tolterodine (DETROL LA) 4 MG 24 hr capsule TAKE ONE CAPSULE BY MOUTH DAILY 90 capsule 0  . diltiazem (CARDIZEM CD) 240 MG 24 hr capsule Take 1 capsule (240 mg total) by mouth daily. 90 capsule 3  . metoprolol tartrate (LOPRESSOR) 25 MG tablet Take 1 tablet (25 mg total) by mouth 2 (two) times daily. 180 tablet 3   No current facility-administered medications on file prior to visit.    Allergies  Allergen Reactions  . Cephalexin Itching  . Ciprofloxacin Itching  . Diclofenac Nausea Only    Upset stomach  . Shellfish-Derived Products Nausea And Vomiting    Objective: Physical Exam  General: Well developed, nourished, no acute distress, awake, alert and oriented x 3  Vascular: Dorsalis pedis artery 1/4 bilateral, Posterior tibial artery 0/4 bilateral, skin temperature warm to warm proximal to distal bilateral lower extremities, trace edema at ankles bilateral, pedal hair present bilateral.  Neurological: Gross sensation present via light touch bilateral.   Dermatological: Skin is warm, dry, and supple bilateral, Nails 1-10 are tender, long, thick, and discolored with mild subungal debris, no webspace macerations present bilateral, no open lesions present bilateral, minimal reactive keratosis medial aspect of right bunion greater than plantar left forefoot. No signs of infection bilateral.  Abrasion to anterior  shin on the right that appears to be healing well without any issues.  Musculoskeletal: Asymptomatic bunion boney deformities noted bilateral. Muscular strength within normal limits without painon range of motion. No pain with calf compression bilateral.   Assessment and Plan:  Problem List Items Addressed This Visit    None    Visit Diagnoses    Pain due to onychomycosis of toenails of both feet    -  Primary    Callus of foot       Foot pain, bilateral       Anticoagulant long-term use         -Examined patient.  -Discussed continued care for painful mycotic toenails and callus -Mechanically debrided and reduced mycotic nails with sterile nail nipper and dremel nail file without incident and at no additional smooth callus plantar right foot using rotary bur -Continue with good supportive shoes for foot type like previous -Patient to return in 2.5 to 3 months for follow up evaluation or sooner if symptoms worsen.  Landis Martins, DPM

## 2019-12-21 ENCOUNTER — Other Ambulatory Visit: Payer: Self-pay | Admitting: Family Medicine

## 2019-12-22 NOTE — Telephone Encounter (Signed)
Called patient's husband (DPR). He stated patient's HR has been in the low 50's and BP has been low at times in the mornings 95/64. In the evenings patient's HR is up 101 and  BP is good at 125/80. Patient feels like she is in A. FIB and is having SOB with activity. Patient is fine at rest. Patient is taking all her medications. Will have patient come in to be evaluated on Monday. Made patient appointment with Truitt Merle NP, who she has seen in the past. Will forward to Truitt Merle NP and Dr. Tamala Julian for further advisement.

## 2019-12-22 NOTE — Progress Notes (Signed)
CARDIOLOGY OFFICE NOTE  Date:  12/25/2019    Connie [name] Date of Birth: 28-Jun-1927 Medical Record #[id number]  PCP:  Rita Ohara, MD  Cardiologist:  Jennings Books    Chief Complaint  Patient presents with  . Dizziness    Work in visit - seen for Dr. Tamala Julian    History of Present Illness: Connie [name] is a 86 y.o. female who presents today for a work in visit. Seen for Dr. Tamala Julian.   She has a history of HTN, HLD and PAF - she is on anticoagulation.   I saw her back earlier this year.   She saw Dr. Tamala Julian back in April and was feeling better but having some lightheadedness and weakness - regardless of being in or our of AF. Daughter is a physician and advised her to cut her HCTZ back and she was feeling better.   My Chart message sent last week with concerns of weakness and fatigue.   Thus added to my schedule for today.   Comes in today. Here with her husband. She notes her "pep has departed". He notes that she has not felt well for a few months - getting worse - husband wants to see if anything can be done with her medicines. She remains on Diltiazem 240 mg QD and Lopressor 25 mg BID. She has had a few bouts of AF - this does not seem to be too bothersome for her. She remains on Xarelto. No bleeding. Has been having some GI issues - seeing PCP later this week. Per her diary - her HR is primarily in the 50's.   Past Medical History:  Diagnosis Date  . Abnormal brain MRI 8/01   small hemorrhagic stroke and possible cavernous hemangioma (Dr. Erling Cruz)  . Adenomatous colon polyp 1/03  . Arthritis    OA spine, hands  . Breast cancer ([place]) 2001   R breast (T3N1, ER/PR+, HER-2 +) s/p mastectomy, chemo and chest wall irradiation (Dr. Truddie Coco)  . DDD (degenerative disc disease), lumbar   . GERD (gastroesophageal reflux disease)   . Hearing loss 2012   wears hearing aids  . Herpes zoster 2003  . Hyperlipidemia   . Hypertension   . Onychomycosis 2003, 2008    treated with Lamisil  . Osteopenia    (DEXA's done by Dr. Truddie Coco); prev took Fosamax.  No change in DEXA after off Fosamax x 2 years  . Ovarian cyst 12/2008   right  . Personal history of chemotherapy   . Personal history of radiation therapy   . Postmenopausal bleeding 2000   benign EMB (Dr. Raphael Gibney)  . Urge urinary incontinence     Past Surgical History:  Procedure Laterality Date  . CATARACT EXTRACTION  summer 2011   bilateral  . CHOLECYSTECTOMY, LAPAROSCOPIC  1/05  . COLONOSCOPY  12/17/08   normal (Dr. Wynetta Emery)  . COLONOSCOPY W/ POLYPECTOMY  05/23/01   adenomatous polyp  . MASTECTOMY Right 2001  . modified mastectomy and tram flap reconstruction  2001   R breast  . TONSILLECTOMY    . tram flap removal     problems with flap after radiation  . TUBAL LIGATION       Medications: Current Meds  Medication Sig  . acetaminophen (TYLENOL) 650 MG CR tablet Take 1,300 mg by mouth 2 (two) times daily.  Marland Kitchen allopurinol (ZYLOPRIM) 100 MG tablet TAKE TWO TABLETS BY MOUTH DAILY  . calcium carbonate (TUMS - DOSED IN MG ELEMENTAL CALCIUM)  500 MG chewable tablet Chew 1 tablet by mouth daily as needed for indigestion or heartburn. Reported on 07/10/2015  . Calcium Carbonate-Vitamin D (CALTRATE 600+D) 600-400 MG-UNIT per tablet Take 1 tablet by mouth daily.   . colchicine 0.6 MG tablet Use as directed. Take two tablets by mouth at start of gout flare up. Then one hour later take one tablet by mouth.  . hydrochlorothiazide (MICROZIDE) 12.5 MG capsule Take 1 capsule (12.5 mg total) by mouth daily.  . metoprolol tartrate (LOPRESSOR) 25 MG tablet Take 0.5 tablets (12.5 mg total) by mouth 2 (two) times daily.  . Multiple Vitamins-Minerals (CENTRUM SILVER PO) Take 1 tablet by mouth daily.    Marland Kitchen omeprazole (PRILOSEC) 20 MG capsule Take 1 capsule (20 mg total) by mouth daily. Reported on 05/23/2015  . pravastatin (PRAVACHOL) 40 MG tablet Take 1 tablet (40 mg total) by mouth daily.  . Rivaroxaban (XARELTO)  15 MG TABS tablet TAKE ONE TABLET BY MOUTH DAILY WITH SUPPER.  Marland Kitchen tolterodine (DETROL LA) 4 MG 24 hr capsule TAKE ONE CAPSULE BY MOUTH DAILY     Allergies: Allergies  Allergen Reactions  . Cephalexin Itching  . Ciprofloxacin Itching  . Diclofenac Nausea Only    Upset stomach  . Shellfish-Derived Products Nausea And Vomiting    Social History: The patient  reports that she has never smoked. She has never used smokeless tobacco. She reports that she does not drink alcohol and does not use drugs.   Family History: The patient's family history includes Diabetes in her brother; Heart disease in her brother, father, and mother; Hypertension in her father and mother.   Review of Systems: Please see the history of present illness.   All other systems are reviewed and negative.   Physical Exam: VS:  BP 130/66   Pulse 62   Ht 5' (1.524 m)   Wt 159 lb (72.1 kg)   SpO2 97%   BMI 31.05 kg/m  .  BMI Body mass index is 31.05 kg/m.  Wt Readings from Last 3 Encounters:  12/25/19 159 lb (72.1 kg)  08/16/19 161 lb 12.8 oz (73.4 kg)  07/12/19 158 lb (71.7 kg)    General: Pleasant. Alert and in no acute distress.   Cardiac: Regular rate and rhythm. No murmurs, rubs, or gallops. No edema.  Respiratory:  Lungs are clear to auscultation bilaterally with normal work of breathing.  GI: Soft and nontender.  MS: No deformity or atrophy. Gait and ROM intact.  Skin: Warm and dry. Color is normal.  Neuro:  Strength and sensation are intact and no gross focal deficits noted.  Psych: Alert, appropriate and with normal affect.   LABORATORY DATA:  EKG:  EKG is ordered today.  Personally reviewed by me. This demonstrates NSR.  Lab Results  Component Value Date   WBC 6.2 07/04/2019   HGB 12.8 07/04/2019   HCT 37.8 07/04/2019   PLT 183 07/04/2019   GLUCOSE 100 (H) 07/04/2019   CHOL 155 07/04/2019   TRIG 257 (H) 07/04/2019   HDL 38 (L) 07/04/2019   LDLCALC 75 07/04/2019   ALT 17 07/04/2019     AST 21 07/04/2019   NA 144 07/04/2019   K 3.8 07/04/2019   CL 102 07/04/2019   CREATININE 1.34 (H) 07/04/2019   BUN 23 07/04/2019   CO2 28 07/04/2019   TSH 2.840 05/22/2019     BNP (last 3 results) No results for input(s): BNP in the last 8760 hours.  ProBNP (last 3 results)  No results for input(s): PROBNP in the last 8760 hours.   Other Studies Reviewed Today:  Monitor Study Highlights 02/2019   Paroxysmal atrial fibrillation, total burden 2%. Longest episode greater than 5 hours.  7 beat wide-complex tachycardia  Predominant rhythm normal sinus with PACs and PVCs.  No sustained ventricular tachycardia.     Echo Study Conclusions 2017  - Left ventricle: The cavity size was normal. Wall thickness was  increased in a pattern of mild LVH. Systolic function was normal.  The estimated ejection fraction was in the range of 50% to 55%.  Wall motion was normal; there were no regional wall motion  abnormalities.  - Aortic valve: Trileaflet; mildly thickened, mildly calcified  leaflets. There was mild regurgitation.  - Mitral valve: Calcified annulus. There was mild regurgitation.  - Pericardium, extracardiac: A trivial pericardial effusion was  identified posterior to the heart.    ASSESSMENT & PLAN:    1. Weakness/fatigue - may be from her HR in the 50's - no profound bradycardia noted - she does not wish to have another monitor. Will try cutting Lopressor back to 12. 5 mg BID - but will leave her on the CCB at current dose but this may need adjusting as well - but then we may have worsening/increasing burden of AF. She is to let us know if she fails to improve in the next few weeks.   2. Bradycardia - see #1.   3. PAF - she is in sinus today. HR ok here but her readings note primary readings in the 50's. Will need to follow.   4. HTN - BP is great and at goal for the most part. See above for changes made.   5. Chronic anticoagulation - recheck  surveillance labs. No problems noted.   6. Fatigue - suspect this is multifactorial - but also will try cutting Metoprolol back as described above.   Current medicines are reviewed with the patient today.  The patient does not have concerns regarding medicines other than what has been noted above.  The following changes have been made:  See above.  Labs/ tests ordered today include:    Orders Placed This Encounter  Procedures  . Basic metabolic panel  . CBC  . TSH     Disposition:   FU with Dr. Tamala Julian as planned in October - if she fails to improve over the next few weeks - she is to let us know.    Patient is agreeable to this plan and will call if any problems develop in the interim.   SignedTruitt Merle, NP  12/25/2019 4:00 PM  [facility] [address] [place], [place]  [address] Phone: [phone] Fax: [phone]

## 2019-12-25 ENCOUNTER — Other Ambulatory Visit: Payer: Self-pay

## 2019-12-25 ENCOUNTER — Encounter: Payer: Self-pay | Admitting: Nurse Practitioner

## 2019-12-25 ENCOUNTER — Ambulatory Visit: Payer: Medicare HMO | Admitting: Nurse Practitioner

## 2019-12-25 VITALS — BP 130/66 | HR 62 | Ht 60.0 in | Wt 159.0 lb

## 2019-12-25 DIAGNOSIS — I480 Paroxysmal atrial fibrillation: Secondary | ICD-10-CM

## 2019-12-25 DIAGNOSIS — Z79899 Other long term (current) drug therapy: Secondary | ICD-10-CM | POA: Diagnosis not present

## 2019-12-25 DIAGNOSIS — Z7901 Long term (current) use of anticoagulants: Secondary | ICD-10-CM

## 2019-12-25 MED ORDER — METOPROLOL TARTRATE 25 MG PO TABS: 12.5000 mg | ORAL_TABLET | Freq: Two times a day (BID) | ORAL | 3 refills | Status: DC

## 2019-12-25 NOTE — Patient Instructions (Addendum)
After Visit Summary:  We will be checking the following labs today - BMET, CBC and TSH   Medication Instructions:    Continue with your current medicines. BUT  I am cutting the Lopressor in half - take only 1/2 a pill twice a day.    If you need a refill on your cardiac medications before your next appointment, please call your pharmacy.     Testing/Procedures To Be Arranged:  N/A  Follow-Up:   See Dr. Tamala Julian in October as per recall.     At [facility], you and your health needs are our priority.  As part of our continuing mission to provide you with exceptional heart care, we have created designated Provider Care Teams.  These Care Teams include your primary Cardiologist (physician) and Advanced Practice Providers (APPs -  Physician Assistants and Nurse Practitioners) who all work together to provide you with the care you need, when you need it.  Special Instructions:  . Stay safe, wash your hands for at least 20 seconds and wear a mask when needed.  . It was good to talk with you today.  . If you do not feel you are improving over the next few weeks - let us know - we can make other changes with your medicines.    Call the [place] office at [phone] if you have any questions, problems or concerns.

## 2019-12-26 ENCOUNTER — Other Ambulatory Visit (INDEPENDENT_AMBULATORY_CARE_PROVIDER_SITE_OTHER): Payer: Medicare HMO | Admitting: *Deleted

## 2019-12-26 DIAGNOSIS — I480 Paroxysmal atrial fibrillation: Secondary | ICD-10-CM | POA: Diagnosis not present

## 2019-12-26 LAB — BASIC METABOLIC PANEL
BUN/Creatinine Ratio: 18 (ref 12–28)
BUN: 35 mg/dL (ref 10–36)
CO2: 27 mmol/L (ref 20–29)
Calcium: 10.2 mg/dL (ref 8.7–10.3)
Chloride: 100 mmol/L (ref 96–106)
Creatinine, Ser: 1.96 mg/dL — ABNORMAL HIGH (ref 0.57–1.00)
GFR calc Af Amer: 25 mL/min/{1.73_m2} — ABNORMAL LOW (ref >59)
GFR calc non Af Amer: 22 mL/min/{1.73_m2} — ABNORMAL LOW (ref >59)
Glucose: 104 mg/dL — ABNORMAL HIGH (ref 65–99)
Potassium: 4.1 mmol/L (ref 3.5–5.2)
Sodium: 143 mmol/L (ref 134–144)

## 2019-12-26 LAB — CBC
Hematocrit: 38.7 % (ref 34.0–46.6)
Hemoglobin: 13.2 g/dL (ref 11.1–15.9)
MCH: 31.7 pg (ref 26.6–33.0)
MCHC: 34.1 g/dL (ref 31.5–35.7)
MCV: 93 fL (ref 79–97)
Platelets: 186 10*3/uL (ref 150–450)
RBC: 4.17 x10E6/uL (ref 3.77–5.28)
RDW: 13.4 % (ref 11.7–15.4)
WBC: 7.3 10*3/uL (ref 3.4–10.8)

## 2019-12-26 LAB — TSH: TSH: 2.26 u[IU]/mL (ref 0.450–4.500)

## 2019-12-27 ENCOUNTER — Other Ambulatory Visit: Payer: Self-pay | Admitting: Family Medicine

## 2020-01-10 NOTE — Progress Notes (Signed)
Chief Complaint  Patient presents with  . Hyperlipidemia    fasting med check, was unsure of whether or not to release or cancel CBC as she said she does have some abdominal pain of her right side.    Patient presents for 6 month follow-up. She is accompanied by her husband today.  She has been having some ongoing GI issues.  She periodically gets discomfort at the R side of her stomach, comes and goes.  Doesn't always think it is gas (sometimes is).  She has had this for "quite a long time", usually short-lived.  Now it is staying a bit longer, and not as much gas associated.  She had cut back on some triggering foods (ie brocolli). She has been more constipated than usual.  She eats more spinach and raisins, which helps with the constipation, and usually ends up having diarrhea for a day once things start moving. Denies any blood or mucus in the stool. No fever, chills, nausea, vomiting.  Currently denies any abdominal pain.  She didn't feel good in July and August.  She discussed this at her recent cardiology visit; they cut the metoprolol dose in half and she is feeling better now.  Recurrent afib with RVR.  She last saw cardiologist about 2 weeks ago. At that time she reported not feeling well for a few months (fatigue, some dizziness). She was taking Diltiazem 240 mg QD and Lopressor 25 mg BID. Pulse was in the 50's, so dose of lopressor was cut back to 12.66m BID. Labs were done, which showed that her creatinine had bumped up to 1.96 from 1.34.  No changes were made.  TSH and CBC were fine. She is trying to drink a lot of water, but sometimes she does notice that her urine is dark.  Since she cut back the dose of the lopressor, BP's have been running 114-132/60-67. Pulse has been running 57-63 (once 149/68) She feels better--improved energy, no dizziness. Husband reports having to slow down for her with walking, due to some shortness of breath.  She reports feeling better (husband isn't  sure this has improved much yet).  This was mentioned to the cardiologist.  She denies headaches, dizziness (resolved with lowering the dose), chest pain, muscle cramps. She continues on Xarelto, and denies any bleeding or complications. She uses Tylenol Arthritis 2 BID, which controls her pain (feet, hands, back).  Gout: She hasn't had any recurrent gout of her great toe, on her current dose of allopurinol (2040m. Uric acid increased from 4.5 to 6.1 when HCTZ was initially started, but hadn't had any flares. Denies gout flares since HCTZ was restarted. Uric acid level due today. Lab Results  Component Value Date   LABURIC 5.1 01/04/2019   Hyperlipidemia: Compliant with 401mf pravastatin without side effects.She hasn't tolerated fish oil in the past (due to belching).She tries to follow a lowfat, low cholesterol diet. TG elevated on last check.  She reported occasional fried fish and desserts 2x/week. She and husband both report that diet has improved (but not perfect).  Lab Results  Component Value Date   CHOL 155 07/04/2019   HDL 38 (L) 07/04/2019   LDLCALC 75 07/04/2019   TRIG 257 (H) 07/04/2019   CHOLHDL 4.1 07/04/2019   Urge urinary incontinence--TakingDetrol LA4mg60most of the time it works well. Occasionally has a night where she is up every 2 hours at night (no frequency during the day). She usually gets up just 2-3x/night. She does report some dry  mouth--this improved after using humidifier (mainly for husband's nosebleeds). Hasn't needed to use the humidifier during the summer, and dry mouth is still manageable. Denies any dysuria, odor or hematuria. She wearsPoise pads daily, due to the urgencyand incontinence if she is notclose enough to the bathroom.Marland Kitchen  GERD--She has reflux about once a month, and will take either Prilosec or Tums, or both. Food served isn't spicy, and eats at 6pm. Denies dysphagia.   PMH, PSH, SH reviewed  Outpatient Encounter  Medications as of 01/11/2020  Medication Sig  . acetaminophen (TYLENOL) 650 MG CR tablet Take 1,300 mg by mouth 2 (two) times daily.  Marland Kitchen allopurinol (ZYLOPRIM) 100 MG tablet TAKE TWO TABLETS BY MOUTH DAILY  . calcium carbonate (TUMS - DOSED IN MG ELEMENTAL CALCIUM) 500 MG chewable tablet Chew 1 tablet by mouth daily as needed for indigestion or heartburn. Reported on 07/10/2015  . Calcium Carbonate-Vitamin D (CALTRATE 600+D) 600-400 MG-UNIT per tablet Take 1 tablet by mouth daily.   Marland Kitchen diltiazem (CARDIZEM CD) 240 MG 24 hr capsule Take 1 capsule (240 mg total) by mouth daily.  . hydrochlorothiazide (MICROZIDE) 12.5 MG capsule Take 1 capsule (12.5 mg total) by mouth daily.  . metoprolol tartrate (LOPRESSOR) 25 MG tablet Take 0.5 tablets (12.5 mg total) by mouth 2 (two) times daily.  . Multiple Vitamins-Minerals (CENTRUM SILVER PO) Take 1 tablet by mouth daily.    Marland Kitchen omeprazole (PRILOSEC) 20 MG capsule Take 1 capsule (20 mg total) by mouth daily. Reported on 05/23/2015  . pravastatin (PRAVACHOL) 40 MG tablet Take 1 tablet (40 mg total) by mouth daily.  . Rivaroxaban (XARELTO) 15 MG TABS tablet TAKE ONE TABLET BY MOUTH DAILY WITH SUPPER.  Marland Kitchen tolterodine (DETROL LA) 4 MG 24 hr capsule TAKE ONE CAPSULE BY MOUTH DAILY  . colchicine 0.6 MG tablet Use as directed. Take two tablets by mouth at start of gout flare up. Then one hour later take one tablet by mouth. (Patient not taking: Reported on 01/11/2020)   No facility-administered encounter medications on file as of 01/11/2020.   Allergies  Allergen Reactions  . Cephalexin Itching  . Ciprofloxacin Itching  . Diclofenac Nausea Only    Upset stomach  . Shellfish-Derived Products Nausea And Vomiting    ROS: no fever, chills, URI symptoms, chest pain, cough, shortness of breath (improved some, has some with walking). No dysuria, hematuria.  Other urinary complaints per HPI.  No nausea, vomiting. Occasional heartburn per HPI. No bleeding, bruising, rash. Some  constipation and RLQ intermittent discomfort. +dry mouth Joint pains are controlled with tylenol Some ankle swelling at night, resolved by morning.    PHYSICAL EXAM:  BP 138/70   Pulse 60   Ht 5' (1.524 m)   Wt 161 lb 9.6 oz (73.3 kg)   BMI 31.56 kg/m   Wt Readings from Last 3 Encounters:  01/11/20 161 lb 9.6 oz (73.3 kg)  12/25/19 159 lb (72.1 kg)  08/16/19 161 lb 12.8 oz (73.4 kg)   Well developed, pleasant female, in good spirits, talkative. HEENT: PERRL, EOMI, conjunctiva clear. Wearing mask due to COVID-19 pandemic Neck: no lymphadenopathy, thyromegaly or carotid bruit Heart: regular rate and rhythm. No heart murmur Lungs: clear bilaterally Back: no CVA or spinal tenderness Abdomen: soft, nontender, no organomegaly or mass Extremities: No edema. 2+ pulses.  Skin: no rashes. Normal turgor. Wearing compression sleeve on the right.  Neuro: alert and oriented. Normal gait. Psych: normal mood, affect, hygiene and grooming   Review of recent labs: Lab  Results  Component Value Date   TSH 2.260 12/25/2019   Lab Results  Component Value Date   WBC 7.3 12/25/2019   HGB 13.2 12/25/2019   HCT 38.7 12/25/2019   MCV 93 12/25/2019   PLT 186 12/25/2019     Chemistry      Component Value Date/Time   NA 143 12/25/2019 1555   NA 141 11/11/2012 1020   K 4.1 12/25/2019 1555   K 4.1 11/11/2012 1020   CL 100 12/25/2019 1555   CO2 27 12/25/2019 1555   CO2 26 11/11/2012 1020   BUN 35 12/25/2019 1555   BUN 31.6 (H) 11/11/2012 1020   CREATININE 1.96 (H) 12/25/2019 1555   CREATININE 1.16 (H) 12/08/2016 0755   CREATININE 1.5 (H) 11/11/2012 1020      Component Value Date/Time   CALCIUM 10.2 12/25/2019 1555   CALCIUM 10.1 11/11/2012 1020   ALKPHOS 94 07/04/2019 0856   ALKPHOS 78 11/11/2012 1020   AST 21 07/04/2019 0856   AST 19 11/11/2012 1020   ALT 17 07/04/2019 0856   ALT 17 11/11/2012 1020   BILITOT 0.4 07/04/2019 0856   BILITOT 0.43 11/11/2012 1020       ASSESSMENT/PLAN:  Mixed hyperlipidemia - LDL at goal on statin, but elevated TG.  Discussed lovaza/vascepa if TG remains elevated. Diet reviewed - Plan: Lipid panel  Paroxysmal atrial fibrillation with rapid ventricular response (HCC) - in sinus rhythm. Feeling better since BB dose decreased and pulse a little higher  Long term current use of anticoagulant therapy - no complications  Urge incontinence of urine - cont current therapy; tolerating mild dry mouth, constipation  Medication monitoring encounter - Plan: Lipid panel, Uric acid, Comprehensive metabolic panel  Need for influenza vaccination - Plan: Flu Vaccine QUAD High Dose(Fluad)  Gout of big toe - no recent flares.  on allopurinol - Plan: Uric acid  Essential hypertension, benign - Cont current meds - Plan: Comprehensive metabolic panel  Constipation, unspecified constipation type - counseled re: water, high fiber diet, medications/supplements.   Addendum--TG still >200. Pravastatin refilled at 32m. Added Lovaza (we had discussed adding either lovaza or vascepa if TG still over 200).  Recheck lipids 4-6 weeks after starting pravastatin.  Will need orders for CPE placed after next lipid check-- CBC, c-met, lipids, A1c (fglu was 110--prediabetic, on current labs).

## 2020-01-11 ENCOUNTER — Encounter: Payer: Self-pay | Admitting: Family Medicine

## 2020-01-11 ENCOUNTER — Ambulatory Visit (INDEPENDENT_AMBULATORY_CARE_PROVIDER_SITE_OTHER): Payer: Medicare HMO | Admitting: Family Medicine

## 2020-01-11 ENCOUNTER — Other Ambulatory Visit: Payer: Self-pay

## 2020-01-11 VITALS — BP 138/70 | HR 60 | Ht 60.0 in | Wt 161.6 lb

## 2020-01-11 DIAGNOSIS — I10 Essential (primary) hypertension: Secondary | ICD-10-CM

## 2020-01-11 DIAGNOSIS — E782 Mixed hyperlipidemia: Secondary | ICD-10-CM | POA: Diagnosis not present

## 2020-01-11 DIAGNOSIS — Z23 Encounter for immunization: Secondary | ICD-10-CM | POA: Diagnosis not present

## 2020-01-11 DIAGNOSIS — N3941 Urge incontinence: Secondary | ICD-10-CM

## 2020-01-11 DIAGNOSIS — I480 Paroxysmal atrial fibrillation: Secondary | ICD-10-CM | POA: Diagnosis not present

## 2020-01-11 DIAGNOSIS — M109 Gout, unspecified: Secondary | ICD-10-CM | POA: Diagnosis not present

## 2020-01-11 DIAGNOSIS — K5900 Constipation, unspecified: Secondary | ICD-10-CM

## 2020-01-11 DIAGNOSIS — Z5181 Encounter for therapeutic drug level monitoring: Secondary | ICD-10-CM

## 2020-01-11 DIAGNOSIS — Z7901 Long term (current) use of anticoagulants: Secondary | ICD-10-CM | POA: Diagnosis not present

## 2020-01-11 NOTE — Patient Instructions (Addendum)
Be sure to stay well hydrated. Try and drink your fluids early in the day, limiting how much you drink with/after dinner.  Eat a high fiber diet. Continue raisins vs prune juice vs miralax if needed for treatment/prevention of constipation.  Consider a fiber supplement like Metamucil daily.  Continue to limit sodium, soups. Keep legs elevated when you're reading, watching TV. Consider wearing compression socks if the swelling is bothersome.

## 2020-01-12 ENCOUNTER — Other Ambulatory Visit: Payer: Self-pay | Admitting: *Deleted

## 2020-01-12 DIAGNOSIS — E781 Pure hyperglyceridemia: Secondary | ICD-10-CM

## 2020-01-12 LAB — COMPREHENSIVE METABOLIC PANEL
ALT: 17 IU/L (ref 0–32)
AST: 22 IU/L (ref 0–40)
Albumin/Globulin Ratio: 2.2 (ref 1.2–2.2)
Albumin: 4.4 g/dL (ref 3.5–4.6)
Alkaline Phosphatase: 102 IU/L (ref 48–121)
BUN/Creatinine Ratio: 25 (ref 12–28)
BUN: 30 mg/dL (ref 10–36)
Bilirubin Total: 0.4 mg/dL (ref 0.0–1.2)
CO2: 26 mmol/L (ref 20–29)
Calcium: 10.5 mg/dL — ABNORMAL HIGH (ref 8.7–10.3)
Chloride: 100 mmol/L (ref 96–106)
Creatinine, Ser: 1.2 mg/dL — ABNORMAL HIGH (ref 0.57–1.00)
GFR calc Af Amer: 46 mL/min/{1.73_m2} — ABNORMAL LOW (ref >59)
GFR calc non Af Amer: 40 mL/min/{1.73_m2} — ABNORMAL LOW (ref >59)
Globulin, Total: 2 g/dL (ref 1.5–4.5)
Glucose: 110 mg/dL — ABNORMAL HIGH (ref 65–99)
Potassium: 4.5 mmol/L (ref 3.5–5.2)
Sodium: 140 mmol/L (ref 134–144)
Total Protein: 6.4 g/dL (ref 6.0–8.5)

## 2020-01-12 LAB — LIPID PANEL
Chol/HDL Ratio: 4.3 ratio (ref 0.0–4.4)
Cholesterol, Total: 166 mg/dL (ref 100–199)
HDL: 39 mg/dL — ABNORMAL LOW (ref >39)
LDL Chol Calc (NIH): 87 mg/dL (ref 0–99)
Triglycerides: 239 mg/dL — ABNORMAL HIGH (ref 0–149)
VLDL Cholesterol Cal: 40 mg/dL (ref 5–40)

## 2020-01-12 LAB — URIC ACID: Uric Acid: 5.7 mg/dL (ref 3.1–7.9)

## 2020-01-12 MED ORDER — PRAVASTATIN SODIUM 40 MG PO TABS: 40.0000 mg | ORAL_TABLET | Freq: Every day | ORAL | 1 refills | Status: DC

## 2020-01-12 MED ORDER — OMEGA-3-ACID ETHYL ESTERS 1 G PO CAPS: 2.0000 g | ORAL_CAPSULE | Freq: Two times a day (BID) | ORAL | 5 refills | Status: DC

## 2020-01-12 NOTE — Progress Notes (Signed)
Scheduled patient for 02/15/20 at 9:15. Entered future order.

## 2020-02-01 ENCOUNTER — Ambulatory Visit (INDEPENDENT_AMBULATORY_CARE_PROVIDER_SITE_OTHER): Payer: Medicare HMO | Admitting: Sports Medicine

## 2020-02-01 ENCOUNTER — Other Ambulatory Visit: Payer: Self-pay

## 2020-02-01 ENCOUNTER — Encounter: Payer: Self-pay | Admitting: Sports Medicine

## 2020-02-01 DIAGNOSIS — L84 Corns and callosities: Secondary | ICD-10-CM

## 2020-02-01 DIAGNOSIS — M79675 Pain in left toe(s): Secondary | ICD-10-CM | POA: Diagnosis not present

## 2020-02-01 DIAGNOSIS — Z7901 Long term (current) use of anticoagulants: Secondary | ICD-10-CM | POA: Diagnosis not present

## 2020-02-01 DIAGNOSIS — M79674 Pain in right toe(s): Secondary | ICD-10-CM

## 2020-02-01 DIAGNOSIS — M79671 Pain in right foot: Secondary | ICD-10-CM | POA: Diagnosis not present

## 2020-02-01 DIAGNOSIS — M79672 Pain in left foot: Secondary | ICD-10-CM

## 2020-02-01 DIAGNOSIS — B351 Tinea unguium: Secondary | ICD-10-CM

## 2020-02-01 NOTE — Progress Notes (Signed)
Subjective: Dayton Sherr is a 86 y.o. female patient seen today in office with complaint of painful thickened and elongated toenails and callus; unable to trim. Patient denies changes with medical history; still on Xalerto for Afib like before without any changes.    Patient Active Problem List   Diagnosis Date Noted  . Chronic anticoagulation 05/11/2016  . Atrial fibrillation with rapid ventricular response ([place]) 06/24/2015  . Gout 10/30/2013  . Gout of big toe 10/30/2013  . Osteoarthritis of multiple joints 10/30/2013  . Breast cancer, right breast ([place]) 11/25/2012  . Urge incontinence of urine 04/24/2011  . OA (osteoarthritis) 04/24/2011  . Essential hypertension, benign 10/23/2010  . Pure hypercholesterolemia 10/23/2010    Current Outpatient Medications on File Prior to Visit  Medication Sig Dispense Refill  . acetaminophen (TYLENOL) 650 MG CR tablet Take 1,300 mg by mouth 2 (two) times daily.    Marland Kitchen allopurinol (ZYLOPRIM) 100 MG tablet TAKE TWO TABLETS BY MOUTH DAILY 180 tablet 0  . calcium carbonate (TUMS - DOSED IN MG ELEMENTAL CALCIUM) 500 MG chewable tablet Chew 1 tablet by mouth daily as needed for indigestion or heartburn. Reported on 07/10/2015    . Calcium Carbonate-Vitamin D (CALTRATE 600+D) 600-400 MG-UNIT per tablet Take 1 tablet by mouth daily.     . colchicine 0.6 MG tablet Use as directed. Take two tablets by mouth at start of gout flare up. Then one hour later take one tablet by mouth.     . hydrochlorothiazide (MICROZIDE) 12.5 MG capsule Take 1 capsule (12.5 mg total) by mouth daily. 90 capsule 3  . metoprolol tartrate (LOPRESSOR) 25 MG tablet Take 0.5 tablets (12.5 mg total) by mouth 2 (two) times daily. 180 tablet 3  . Multiple Vitamins-Minerals (CENTRUM SILVER PO) Take 1 tablet by mouth daily.      Marland Kitchen omega-3 acid ethyl esters (LOVAZA) 1 g capsule Take 2 capsules (2 g total) by mouth 2 (two) times daily. 120 capsule 5  . omeprazole (PRILOSEC) 20 MG capsule  Take 1 capsule (20 mg total) by mouth daily. Reported on 05/23/2015    . pravastatin (PRAVACHOL) 40 MG tablet Take 1 tablet (40 mg total) by mouth daily. 90 tablet 1  . Rivaroxaban (XARELTO) 15 MG TABS tablet TAKE ONE TABLET BY MOUTH DAILY WITH SUPPER. 90 tablet 3  . tolterodine (DETROL LA) 4 MG 24 hr capsule TAKE ONE CAPSULE BY MOUTH DAILY 90 capsule 0  . diltiazem (CARDIZEM CD) 240 MG 24 hr capsule Take 1 capsule (240 mg total) by mouth daily. 90 capsule 3   No current facility-administered medications on file prior to visit.    Allergies  Allergen Reactions  . Cephalexin Itching  . Ciprofloxacin Itching  . Diclofenac Nausea Only    Upset stomach  . Shellfish-Derived Products Nausea And Vomiting    Objective: Physical Exam  General: Well developed, nourished, no acute distress, awake, alert and oriented x 3  Vascular: Dorsalis pedis artery 1/4 bilateral, Posterior tibial artery 0/4 bilateral, skin temperature warm to warm proximal to distal bilateral lower extremities, trace edema at ankles bilateral, pedal hair present bilateral.  Neurological: Gross sensation present via light touch bilateral.   Dermatological: Skin is warm, dry, and supple bilateral, Nails 1-10 are tender, long, thick, and discolored with mild subungal debris, no webspace macerations present bilateral, no open lesions present bilateral, minimal reactive keratosis medial aspect of right bunion greater than plantar left forefoot. No signs of infection bilateral.  Abrasion to anterior shin on the  right that appears to be healing well without any issues.  Musculoskeletal: Asymptomatic bunion boney deformities noted bilateral. Muscular strength within normal limits without painon range of motion. No pain with calf compression bilateral.   Assessment and Plan:  Problem List Items Addressed This Visit    None    Visit Diagnoses    Pain due to onychomycosis of toenails of both feet    -  Primary   Callus of foot        Foot pain, bilateral       Anticoagulant long-term use         -Examined patient.  -Re-Discussed continued care for painful mycotic toenails and callus -Mechanically debrided and reduced mycotic nails with sterile nail nipper and dremel nail file without incident and at no additional smooth callus plantar right foot using rotary bur -Continue with good supportive shoes for foot type like previous -Patient to return in 2.5 to 3 months for follow up evaluation or sooner if symptoms worsen.  Landis Martins, DPM

## 2020-02-02 DIAGNOSIS — C44622 Squamous cell carcinoma of skin of right upper limb, including shoulder: Secondary | ICD-10-CM | POA: Diagnosis not present

## 2020-02-02 DIAGNOSIS — D1721 Benign lipomatous neoplasm of skin and subcutaneous tissue of right arm: Secondary | ICD-10-CM | POA: Diagnosis not present

## 2020-02-02 DIAGNOSIS — L814 Other melanin hyperpigmentation: Secondary | ICD-10-CM | POA: Diagnosis not present

## 2020-02-02 DIAGNOSIS — C44319 Basal cell carcinoma of skin of other parts of face: Secondary | ICD-10-CM | POA: Diagnosis not present

## 2020-02-02 DIAGNOSIS — D1801 Hemangioma of skin and subcutaneous tissue: Secondary | ICD-10-CM | POA: Diagnosis not present

## 2020-02-02 DIAGNOSIS — D485 Neoplasm of uncertain behavior of skin: Secondary | ICD-10-CM | POA: Diagnosis not present

## 2020-02-02 DIAGNOSIS — L821 Other seborrheic keratosis: Secondary | ICD-10-CM | POA: Diagnosis not present

## 2020-02-08 ENCOUNTER — Encounter: Payer: Self-pay | Admitting: Family Medicine

## 2020-02-15 ENCOUNTER — Other Ambulatory Visit: Payer: Self-pay | Admitting: *Deleted

## 2020-02-15 ENCOUNTER — Other Ambulatory Visit: Payer: Medicare HMO

## 2020-02-15 ENCOUNTER — Other Ambulatory Visit: Payer: Self-pay

## 2020-02-15 DIAGNOSIS — E782 Mixed hyperlipidemia: Secondary | ICD-10-CM

## 2020-02-15 DIAGNOSIS — Z7901 Long term (current) use of anticoagulants: Secondary | ICD-10-CM

## 2020-02-15 DIAGNOSIS — Z5181 Encounter for therapeutic drug level monitoring: Secondary | ICD-10-CM

## 2020-02-15 DIAGNOSIS — I10 Essential (primary) hypertension: Secondary | ICD-10-CM

## 2020-02-15 DIAGNOSIS — E781 Pure hyperglyceridemia: Secondary | ICD-10-CM

## 2020-02-15 DIAGNOSIS — R7303 Prediabetes: Secondary | ICD-10-CM

## 2020-02-15 DIAGNOSIS — Z85828 Personal history of other malignant neoplasm of skin: Secondary | ICD-10-CM | POA: Diagnosis not present

## 2020-02-15 DIAGNOSIS — C44622 Squamous cell carcinoma of skin of right upper limb, including shoulder: Secondary | ICD-10-CM | POA: Diagnosis not present

## 2020-02-15 DIAGNOSIS — C44319 Basal cell carcinoma of skin of other parts of face: Secondary | ICD-10-CM | POA: Diagnosis not present

## 2020-02-15 LAB — LIPID PANEL

## 2020-02-15 LAB — CBC WITH DIFFERENTIAL/PLATELET

## 2020-02-15 LAB — COMPREHENSIVE METABOLIC PANEL

## 2020-02-16 LAB — LIPID PANEL
Chol/HDL Ratio: 3.6 ratio (ref 0.0–4.4)
Cholesterol, Total: 139 mg/dL (ref 100–199)
HDL: 39 mg/dL — ABNORMAL LOW (ref >39)
LDL Chol Calc (NIH): 75 mg/dL (ref 0–99)
Triglycerides: 139 mg/dL (ref 0–149)
VLDL Cholesterol Cal: 25 mg/dL (ref 5–40)

## 2020-02-16 LAB — COMPREHENSIVE METABOLIC PANEL

## 2020-02-16 LAB — CBC WITH DIFFERENTIAL/PLATELET

## 2020-02-25 NOTE — Progress Notes (Signed)
Cardiology Office Note:    Date:  02/26/2020   ID:  Connie [name], DOB July 25, 1927, MRN [id number]  PCP:  Rita Ohara, MD  Cardiologist:  Sinclair Grooms, MD   Referring MD: Rita Ohara, MD   Chief Complaint  Patient presents with  . Atrial Fibrillation    History of Present Illness:    Connie [name] is a 86 y.o. female with a hx of a hx of hypertension, hyperlipidemia, paroxysmal atrial fibrillation on anticoagulation.   Feels she is having more episodes of atrial fibrillation.  Now she feels that her heart rates and blood pressures get low at times and cause her to feel as though she might faint.  She says she does not feel well most of the time.  There is no chest pain or shortness of breath currently.  Palpating her pulse and listening to her heart suggest the presence of atrial fibrillation now.  She does not feel that she is in atrial fibrillation.  She feels that she can tell every time she is in atrial fibrillation.  Past Medical History:  Diagnosis Date  . Abnormal brain MRI 8/01   small hemorrhagic stroke and possible cavernous hemangioma (Dr. Erling Cruz)  . Adenomatous colon polyp 1/03  . Arthritis    OA spine, hands  . Breast cancer ([place]) 2001   R breast (T3N1, ER/PR+, HER-2 +) s/p mastectomy, chemo and chest wall irradiation (Dr. Truddie Coco)  . DDD (degenerative disc disease), lumbar   . GERD (gastroesophageal reflux disease)   . Hearing loss 2012   wears hearing aids  . Herpes zoster 2003  . Hyperlipidemia   . Hypertension   . Onychomycosis 2003, 2008   treated with Lamisil  . Osteopenia    (DEXA's done by Dr. Truddie Coco); prev took Fosamax.  No change in DEXA after off Fosamax x 2 years  . Ovarian cyst 12/2008   right  . Personal history of chemotherapy   . Personal history of radiation therapy   . Postmenopausal bleeding 2000   benign EMB (Dr. Raphael Gibney)  . Urge urinary incontinence     Past Surgical History:  Procedure Laterality Date  . CATARACT  EXTRACTION  summer 2011   bilateral  . CHOLECYSTECTOMY, LAPAROSCOPIC  1/05  . COLONOSCOPY  12/17/08   normal (Dr. Wynetta Emery)  . COLONOSCOPY W/ POLYPECTOMY  05/23/01   adenomatous polyp  . MASTECTOMY Right 2001  . modified mastectomy and tram flap reconstruction  2001   R breast  . TONSILLECTOMY    . tram flap removal     problems with flap after radiation  . TUBAL LIGATION      Current Medications: Current Meds  Medication Sig  . acetaminophen (TYLENOL) 650 MG CR tablet Take 1,300 mg by mouth 2 (two) times daily.  Marland Kitchen allopurinol (ZYLOPRIM) 100 MG tablet TAKE TWO TABLETS BY MOUTH DAILY  . calcium carbonate (TUMS - DOSED IN MG ELEMENTAL CALCIUM) 500 MG chewable tablet Chew 1 tablet by mouth daily as needed for indigestion or heartburn. Reported on 07/10/2015  . Calcium Carbonate-Vitamin D (CALTRATE 600+D) 600-400 MG-UNIT per tablet Take 1 tablet by mouth daily.   . colchicine 0.6 MG tablet Use as directed. Take two tablets by mouth at start of gout flare up. Then one hour later take one tablet by mouth.   . diltiazem (CARDIZEM CD) 240 MG 24 hr capsule Take 1 capsule (240 mg total) by mouth daily.  . hydrochlorothiazide (MICROZIDE) 12.5 MG capsule Take 1 capsule (12.5  mg total) by mouth daily.  . metoprolol tartrate (LOPRESSOR) 25 MG tablet Take 0.5 tablets (12.5 mg total) by mouth 2 (two) times daily.  . Multiple Vitamins-Minerals (CENTRUM SILVER PO) Take 1 tablet by mouth daily.    Marland Kitchen omega-3 acid ethyl esters (LOVAZA) 1 g capsule Take 2 capsules (2 g total) by mouth 2 (two) times daily.  Marland Kitchen omeprazole (PRILOSEC) 20 MG capsule Take 1 capsule (20 mg total) by mouth daily. Reported on 05/23/2015  . pravastatin (PRAVACHOL) 40 MG tablet Take 1 tablet (40 mg total) by mouth daily.  . Rivaroxaban (XARELTO) 15 MG TABS tablet TAKE ONE TABLET BY MOUTH DAILY WITH SUPPER.  Marland Kitchen tolterodine (DETROL LA) 4 MG 24 hr capsule TAKE ONE CAPSULE BY MOUTH DAILY     Allergies:   Cephalexin, Ciprofloxacin, Diclofenac,  and Shellfish-derived products   Social History   Socioeconomic History  . Marital status: Married    Spouse name: Not on file  . Number of children: 3  . Years of education: Not on file  . Highest education level: Not on file  Occupational History  . Not on file  Tobacco Use  . Smoking status: Never Smoker  . Smokeless tobacco: Never Used  Vaping Use  . Vaping Use: Never used  Substance and Sexual Activity  . Alcohol use: No  . Drug use: No  . Sexual activity: Not Currently    Comment: due to poor libido and pain  Other Topics Concern  . Not on file  Social History Narrative   Moved 06/2018 to [place]. Lives with husband.   1 daughter at [facility]--hospitalist; daughter in [place] (works in Conservator, museum/gallery at [employer] took in Barrister's clerk), son in [place]. 8 grandchildren, 4 great-grandchildren      Updated 07/2019   Social Determinants of Health   Financial Resource Strain:   . Difficulty of Paying Living Expenses: Not on file  Food Insecurity:   . Worried About Charity fundraiser in the Last Year: Not on file  . Ran Out of Food in the Last Year: Not on file  Transportation Needs:   . Lack of Transportation (Medical): Not on file  . Lack of Transportation (Non-Medical): Not on file  Physical Activity:   . Days of Exercise per Week: Not on file  . Minutes of Exercise per Session: Not on file  Stress:   . Feeling of Stress : Not on file  Social Connections:   . Frequency of Communication with Friends and Family: Not on file  . Frequency of Social Gatherings with Friends and Family: Not on file  . Attends Religious Services: Not on file  . Active Member of Clubs or Organizations: Not on file  . Attends Archivist Meetings: Not on file  . Marital Status: Not on file     Family History: The patient's family history includes Diabetes in her brother; Heart disease in her brother, father, and mother; Hypertension in her father and mother. There is no history of  Drug abuse, Cancer, or Breast cancer.  ROS:   Please see the history of present illness.    Does not feel as well as she would like.  This is been going on now for several weeks.  All other systems reviewed and are negative.  EKGs/Labs/Other Studies Reviewed:    The following studies were reviewed today: No recent cardiac data Last continuous monitor 2020 14-day study: Study Highlights   Paroxysmal atrial fibrillation, total burden 2%. Longest  episode greater than 5 hours.  7 beat wide-complex tachycardia  Predominant rhythm normal sinus with PACs and PVCs.  No sustained ventricular tachycardia.  EKG:  EKG atrial fibrillation with ventricular rate 68 bpm.  Nonspecific ST abnormality.  Recent Labs: 05/22/2019: Magnesium 2.3 12/25/2019: Hemoglobin 13.2; Platelets 186; TSH 2.260 01/11/2020: ALT 17; BUN 30; Creatinine, Ser 1.20; Potassium 4.5; Sodium 140  Recent Lipid Panel    Component Value Date/Time   CHOL 139 02/15/2020 0956   TRIG 139 02/15/2020 0956   HDL 39 (L) 02/15/2020 0956   CHOLHDL 3.6 02/15/2020 0956   CHOLHDL 3.6 03/18/2017 1343   VLDL 41 (H) 12/08/2016 0755   LDLCALC 75 02/15/2020 0956   LDLCALC 84 03/18/2017 1343    Physical Exam:    VS:  BP (!) 118/58   Pulse 70   Ht 5' (1.524 m)   Wt 164 lb (74.4 kg)   SpO2 96%   BMI 32.03 kg/m     Wt Readings from Last 3 Encounters:  02/26/20 164 lb (74.4 kg)  01/11/20 161 lb 9.6 oz (73.3 kg)  12/25/19 159 lb (72.1 kg)     GEN: 90 elderly. No acute distress HEENT: Normal NECK: No JVD. LYMPHATICS: No lymphadenopathy CARDIAC: Irregularly irregular RR without murmur, gallop, or edema. VASCULAR:  Normal Pulses. No bruits. RESPIRATORY:  Clear to auscultation without rales, wheezing or rhonchi  ABDOMEN: Soft, non-tender, non-distended, No pulsatile mass, MUSCULOSKELETAL: No deformity  SKIN: Warm and dry NEUROLOGIC:  Alert and oriented x 3 PSYCHIATRIC:  Normal affect   ASSESSMENT:    1. PAF (paroxysmal  atrial fibrillation) (HCC)   2. Chronic anticoagulation   3. Essential hypertension   4. Pure hypercholesterolemia   5. Educated about COVID-19 virus infection    PLAN:    In order of problems listed above:  1. Currently in atrial fibrillation even though she did not think she was.  This demonstrates that her perception of the presence or absence of atrial fibrillation is not accurate.  She did walk with a pulse oximeter relatively briskly, and her heart rate was flat increasing from 68 bpm to 72 bpm.  Plan is to discontinue metoprolol to allow more heart rate variability but hopefully continue to maintain control of resting A. fib rate.  Return in 2 to 4 weeks for follow-up.  Continue to monitor heart rates and overall condition at home.  Notify us if she feels worse.  May need to wear a monitor to determine if he is in A. fib continuously.  Plan to repeat EKG on return. 2. Continue Xarelto 15 mg daily 3. Home blood pressure monitoring has demonstrated pressures between 105/60-133/62 mmHg.  The great majority have been less than 130 mmHg systolic.  We will discontinue metoprolol tartrate 12.5 mg twice daily to see if this allows slightly more blood pressure.  She points out that the time she feels most vulnerable and week is when her systolic pressures are less than 110 mmHg. 4. Continue Pravachol 40 mg/day. 5. COVID-19 vaccinated without COVID-19 disease during the pandemic.   Medication Adjustments/Labs and Tests Ordered: Current medicines are reviewed at length with the patient today.  Concerns regarding medicines are outlined above.  No orders of the defined types were placed in this encounter.  No orders of the defined types were placed in this encounter.   There are no Patient Instructions on file for this visit.  Start  Signed, Lesleigh Noe, MD  02/26/2020 5:02 PM    [facility]

## 2020-02-26 ENCOUNTER — Ambulatory Visit: Payer: Medicare HMO | Admitting: Interventional Cardiology

## 2020-02-26 ENCOUNTER — Other Ambulatory Visit: Payer: Self-pay

## 2020-02-26 ENCOUNTER — Encounter: Payer: Self-pay | Admitting: Interventional Cardiology

## 2020-02-26 VITALS — BP 118/58 | HR 70 | Ht 60.0 in | Wt 164.0 lb

## 2020-02-26 DIAGNOSIS — I480 Paroxysmal atrial fibrillation: Secondary | ICD-10-CM

## 2020-02-26 DIAGNOSIS — I10 Essential (primary) hypertension: Secondary | ICD-10-CM

## 2020-02-26 DIAGNOSIS — E7800 Pure hypercholesterolemia, unspecified: Secondary | ICD-10-CM

## 2020-02-26 DIAGNOSIS — Z7901 Long term (current) use of anticoagulants: Secondary | ICD-10-CM

## 2020-02-26 DIAGNOSIS — Z7189 Other specified counseling: Secondary | ICD-10-CM | POA: Diagnosis not present

## 2020-02-26 NOTE — Patient Instructions (Signed)
Medication Instructions:  1) DISCONTINUE Metoprolol  *If you need a refill on your cardiac medications before your next appointment, please call your pharmacy*   Lab Work: None If you have labs (blood work) drawn today and your tests are completely normal, you will receive your results only by: Marland Kitchen MyChart Message (if you have MyChart) OR . A paper copy in the mail If you have any lab test that is abnormal or we need to change your treatment, we will call you to review the results.   Testing/Procedures: None   Follow-Up: At [facility], you and your health needs are our priority.  As part of our continuing mission to provide you with exceptional heart care, we have created designated Provider Care Teams.  These Care Teams include your primary Cardiologist (physician) and Advanced Practice Providers (APPs -  Physician Assistants and Nurse Practitioners) who all work together to provide you with the care you need, when you need it.  We recommend signing up for the patient portal called "MyChart".  Sign up information is provided on this After Visit Summary.  MyChart is used to connect with patients for Virtual Visits (Telemedicine).  Patients are able to view lab/test results, encounter notes, upcoming appointments, etc.  Non-urgent messages can be sent to your provider as well.   To learn more about what you can do with MyChart, go to [email or web address].    Your next appointment:   2-4 week(s)  The format for your next appointment:   In Person  Provider:   You may see Sinclair Grooms, MD or one of the following Advanced Practice Providers on your designated Care Team:    Truitt Merle, NP  Cecilie Kicks, NP  Kathyrn Drown, NP    Other Instructions

## 2020-03-20 ENCOUNTER — Other Ambulatory Visit: Payer: Self-pay | Admitting: Family Medicine

## 2020-03-22 DIAGNOSIS — M109 Gout, unspecified: Secondary | ICD-10-CM | POA: Diagnosis not present

## 2020-03-22 DIAGNOSIS — N3281 Overactive bladder: Secondary | ICD-10-CM | POA: Diagnosis not present

## 2020-03-22 DIAGNOSIS — Z008 Encounter for other general examination: Secondary | ICD-10-CM | POA: Diagnosis not present

## 2020-03-22 DIAGNOSIS — E785 Hyperlipidemia, unspecified: Secondary | ICD-10-CM | POA: Diagnosis not present

## 2020-03-22 DIAGNOSIS — I4891 Unspecified atrial fibrillation: Secondary | ICD-10-CM | POA: Diagnosis not present

## 2020-03-22 DIAGNOSIS — K219 Gastro-esophageal reflux disease without esophagitis: Secondary | ICD-10-CM | POA: Diagnosis not present

## 2020-03-22 DIAGNOSIS — E669 Obesity, unspecified: Secondary | ICD-10-CM | POA: Diagnosis not present

## 2020-03-22 DIAGNOSIS — M1990 Unspecified osteoarthritis, unspecified site: Secondary | ICD-10-CM | POA: Diagnosis not present

## 2020-03-22 DIAGNOSIS — K5900 Constipation, unspecified: Secondary | ICD-10-CM | POA: Diagnosis not present

## 2020-03-22 DIAGNOSIS — I10 Essential (primary) hypertension: Secondary | ICD-10-CM | POA: Diagnosis not present

## 2020-03-22 DIAGNOSIS — D6869 Other thrombophilia: Secondary | ICD-10-CM | POA: Diagnosis not present

## 2020-03-26 NOTE — Progress Notes (Signed)
Cardiology Office Note:    Date:  03/27/2020   ID:  Connie [name], DOB [birth year], MRN [id number]  PCP:  Rita Ohara, MD  Cardiologist:  Sinclair Grooms, MD   Referring MD: Rita Ohara, MD   Chief Complaint  [name] presents with  . Atrial Fibrillation    History of Present Illness:    Connie [name] is a 86 y.o. female with a hx of hypertension, hyperlipidemia, paroxysmal atrial fibrillationon anticoagulation.   Connie [name] feels better off metoprolol.  She remains in atrial fibrillation continuously based upon her determination.  Heart rates have been reasonably well controlled in Connie 66 to 100 bpm range.  On 2 occasions did she have heart rates greater than 110.  These were on occasions when she had sickness.  Her physician daughter is here today.  She gives a story of Connie [name] doing well until 3 to 4 months ago when she stopped walking and had less energy, quite noticeably.  Connie [name] thought that she was in normal rhythm.  There is a suspicion that around that time she developed permanent atrial fibrillation.  Connie [name] now feels better after discontinuing metoprolol.  Not sure that it is an accurate association.  We discussed whether or not her continued fatigue although somewhat better now has been related to continuous atrial fibrillation.  We further discussed continuing rate control or attempting to reestablish sinus rhythm.  Connie [name] is not having orthopnea.  She does have a little bit of lower extremity edema.  She has a beach trip and a cruise scheduled for December and February respectively.  She has not missed any doses of Xarelto.  Past Medical History:  Diagnosis Date  . Abnormal brain MRI 8/01   small hemorrhagic stroke and possible cavernous hemangioma (Dr. Erling Cruz)  . Adenomatous colon polyp 1/03  . Arthritis    OA spine, hands  . Breast cancer ([place]) 2001   R breast (T3N1, ER/PR+, HER-2 +) s/p mastectomy, chemo and chest wall  irradiation (Dr. Truddie Coco)  . DDD (degenerative disc disease), lumbar   . GERD (gastroesophageal reflux disease)   . Hearing loss 2012   wears hearing aids  . Herpes zoster 2003  . Hyperlipidemia   . Hypertension   . Onychomycosis 2003, 2008   treated with Lamisil  . Osteopenia    (DEXA's done by Dr. Truddie Coco); prev took Fosamax.  No change in DEXA after off Fosamax x 2 years  . Ovarian cyst 12/2008   right  . Personal history of chemotherapy   . Personal history of radiation therapy   . Postmenopausal bleeding 2000   benign EMB (Dr. Raphael Gibney)  . Urge urinary incontinence     Past Surgical History:  Procedure Laterality Date  . CATARACT EXTRACTION  summer 2011   bilateral  . CHOLECYSTECTOMY, LAPAROSCOPIC  1/05  . COLONOSCOPY  12/17/08   normal (Dr. Wynetta Emery)  . COLONOSCOPY W/ POLYPECTOMY  05/23/01   adenomatous polyp  . MASTECTOMY Right 2001  . modified mastectomy and tram flap reconstruction  2001   R breast  . TONSILLECTOMY    . tram flap removal     problems with flap after radiation  . TUBAL LIGATION      Current Medications: Current Meds  Medication Sig  . acetaminophen (TYLENOL) 650 MG CR tablet Take 1,300 mg by mouth 2 (two) times daily.  Marland Kitchen allopurinol (ZYLOPRIM) 100 MG tablet TAKE TWO TABLETS BY MOUTH DAILY  . calcium carbonate (TUMS - DOSED  IN MG ELEMENTAL CALCIUM) 500 MG chewable tablet Chew 1 tablet by mouth daily as needed for indigestion or heartburn. Reported on 07/10/2015  . Calcium Carbonate-Vitamin D (CALTRATE 600+D) 600-400 MG-UNIT per tablet Take 1 tablet by mouth daily.   . colchicine 0.6 MG tablet Use as directed. Take two tablets by mouth at start of gout flare up. Then one hour later take one tablet by mouth.   . hydrochlorothiazide (MICROZIDE) 12.5 MG capsule Take 1 capsule (12.5 mg total) by mouth daily.  . Multiple Vitamins-Minerals (CENTRUM SILVER PO) Take 1 tablet by mouth daily.    Marland Kitchen omega-3 acid ethyl esters (LOVAZA) 1 g capsule Take 2 capsules (2 g  total) by mouth 2 (two) times daily.  Marland Kitchen omeprazole (PRILOSEC) 20 MG capsule Take 1 capsule (20 mg total) by mouth daily. Reported on 05/23/2015  . pravastatin (PRAVACHOL) 40 MG tablet Take 1 tablet (40 mg total) by mouth daily.  . Rivaroxaban (XARELTO) 15 MG TABS tablet TAKE ONE TABLET BY MOUTH DAILY WITH SUPPER.  Marland Kitchen tolterodine (DETROL LA) 4 MG 24 hr capsule TAKE ONE CAPSULE BY MOUTH DAILY     Allergies:   Cephalexin, Ciprofloxacin, Diclofenac, and Shellfish-derived products   Social History   Socioeconomic History  . Marital status: Married    Spouse name: Not on file  . Number of children: 3  . Years of education: Not on file  . Highest education level: Not on file  Occupational History  . Not on file  Tobacco Use  . Smoking status: Never Smoker  . Smokeless tobacco: Never Used  Vaping Use  . Vaping Use: Never used  Substance and Sexual Activity  . Alcohol use: No  . Drug use: No  . Sexual activity: Not Currently    Comment: due to poor libido and pain  Other Topics Concern  . Not on file  Social History Narrative   Moved 06/2018 to [place]. Lives with husband.   1 daughter at [facility] [employer]--hospitalist; daughter in [place] (works in Conservator, museum/gallery at [employer] took in Barrister's clerk), son in [place]. 8 grandchildren, 4 great-grandchildren      Updated 07/2019   Social Determinants of Health   Financial Resource Strain:   . Difficulty of Paying Living Expenses: Not on file  Food Insecurity:   . Worried About Charity fundraiser in Connie Last Year: Not on file  . Ran Out of Food in Connie Last Year: Not on file  Transportation Needs:   . Lack of Transportation (Medical): Not on file  . Lack of Transportation (Non-Medical): Not on file  Physical Activity:   . Days of Exercise per Week: Not on file  . Minutes of Exercise per Session: Not on file  Stress:   . Feeling of Stress : Not on file  Social Connections:   . Frequency of Communication with Friends and Family: Not on file  .  Frequency of Social Gatherings with Friends and Family: Not on file  . Attends Religious Services: Not on file  . Active Member of Clubs or Organizations: Not on file  . Attends Archivist Meetings: Not on file  . Marital Status: Not on file     Family History: Connie [name]'s family history includes Diabetes in her brother; Heart disease in her brother, father, and mother; Hypertension in her father and mother. There is no history of Drug abuse, Cancer, or Breast cancer.  ROS:   Please see Connie history of present illness.  She has not had blood in her urine or stool.  No transient neurological complaints.  She has not had syncope.  All other systems reviewed and are negative.  EKGs/Labs/Other Studies Reviewed:    Connie following studies were reviewed today: No recent imaging data  EKG:  EKG atrial fibrillation with borderline rate control, poor R wave progression, low voltage.  Recent Labs: 05/22/2019: Magnesium 2.3 12/25/2019: Hemoglobin 13.2; Platelets 186; TSH 2.260 01/11/2020: ALT 17; BUN 30; Creatinine, Ser 1.20; Potassium 4.5; Sodium 140  Recent Lipid Panel    Component Value Date/Time   CHOL 139 02/15/2020 0956   TRIG 139 02/15/2020 0956   HDL 39 (L) 02/15/2020 0956   CHOLHDL 3.6 02/15/2020 0956   CHOLHDL 3.6 03/18/2017 1343   VLDL 41 (H) 12/08/2016 0755   LDLCALC 75 02/15/2020 0956   LDLCALC 84 03/18/2017 1343    Physical Exam:    VS:  BP (!) 144/77   Pulse 98   Temp (!) 97.2 F (36.2 C)   Ht '5\' 2"'  (1.575 m)   Wt 135 lb 3.2 oz (61.3 kg)   SpO2 96%   BMI 24.73 kg/m     Wt Readings from Last 3 Encounters:  03/27/20 135 lb 3.2 oz (61.3 kg)  02/26/20 164 lb (74.4 kg)  01/11/20 161 lb 9.6 oz (73.3 kg)     GEN: Elderly but. No acute distress HEENT: Normal NECK: No JVD. LYMPHATICS: No lymphadenopathy CARDIAC: Irregularly irregular RR without murmur, gallop, or edema. VASCULAR:  Normal Pulses. No bruits. RESPIRATORY:  Clear to auscultation without  rales, wheezing or rhonchi  ABDOMEN: Soft, non-tender, non-distended, No pulsatile mass, MUSCULOSKELETAL: No deformity  SKIN: Warm and dry NEUROLOGIC:  Alert and oriented x 3 PSYCHIATRIC:  Normal affect   ASSESSMENT:    1. PAF (paroxysmal atrial fibrillation) ([place])   2. Chronic anticoagulation   3. Essential hypertension   4. Pure hypercholesterolemia   5. Educated about COVID-19 virus infection    PLAN:    In order of problems listed above:  1. It seems that atrial fibrillation is continuous.  A 48-hour monitor will be performed to document.  It will also help with documenting rate control off digoxin.  2D Doppler echocardiogram will be performed to assess substrate.  She will return in 2 to 3 weeks at which time we will discuss elective electrical cardioversion.  With that in mind we will start amiodarone 200 mg twice daily for 2 weeks then 200 mg daily thereafter.  Diltiazem therapy will be continued as listed.  Procedure and risks of Connie electrical cardioversion were discussed briefly but will be reviewed in much more detail.  Connie [name] raised Connie question of whether she should have such an aggressive approach. 2. Continue Xarelto 3. Blood pressure control is adequate continue diltiazem 240 mg/day 4. Continue Pravachol 40 mg/day 5. Vaccinated and practicing social mitigation.   Medication Adjustments/Labs and Tests Ordered: Current medicines are reviewed at length with Connie [name] today.  Concerns regarding medicines are outlined above.  Orders Placed This Encounter  Procedures  . EKG 12-Lead   No orders of Connie defined types were placed in this encounter.   There are no [name] Instructions on file for this visit.   Signed, Sinclair Grooms, MD  03/27/2020 4:37 PM    [place]

## 2020-03-27 ENCOUNTER — Telehealth: Payer: Self-pay | Admitting: Radiology

## 2020-03-27 ENCOUNTER — Other Ambulatory Visit: Payer: Self-pay

## 2020-03-27 ENCOUNTER — Other Ambulatory Visit: Payer: Self-pay | Admitting: Family Medicine

## 2020-03-27 ENCOUNTER — Ambulatory Visit: Payer: Medicare HMO | Admitting: Interventional Cardiology

## 2020-03-27 ENCOUNTER — Encounter: Payer: Self-pay | Admitting: Interventional Cardiology

## 2020-03-27 VITALS — BP 144/77 | HR 98 | Temp 97.2°F | Ht 62.0 in | Wt 135.2 lb

## 2020-03-27 DIAGNOSIS — Z7189 Other specified counseling: Secondary | ICD-10-CM

## 2020-03-27 DIAGNOSIS — I10 Essential (primary) hypertension: Secondary | ICD-10-CM

## 2020-03-27 DIAGNOSIS — E7800 Pure hypercholesterolemia, unspecified: Secondary | ICD-10-CM | POA: Diagnosis not present

## 2020-03-27 DIAGNOSIS — I480 Paroxysmal atrial fibrillation: Secondary | ICD-10-CM

## 2020-03-27 DIAGNOSIS — Z7901 Long term (current) use of anticoagulants: Secondary | ICD-10-CM | POA: Diagnosis not present

## 2020-03-27 MED ORDER — AMIODARONE HCL 200 MG PO TABS: ORAL_TABLET | ORAL | 3 refills | Status: DC

## 2020-03-27 NOTE — Patient Instructions (Addendum)
Medication Instructions:  1) START Amiodarone 200mg  twice daily for 2 weeks, then decrease to once daily.  *If you need a refill on your cardiac medications before your next appointment, please call your pharmacy*   Lab Work: None If you have labs (blood work) drawn today and your tests are completely normal, you will receive your results only by: Marland Kitchen MyChart Message (if you have MyChart) OR . A paper copy in the mail If you have any lab test that is abnormal or we need to change your treatment, we will call you to review the results.   Testing/Procedures: Your physician recommends that you wear a monitor for 3 days.    Your physician has requested that you have an echocardiogram. Echocardiography is a painless test that uses sound waves to create images of your heart. It provides your doctor with information about the size and shape of your heart and how well your heart's chambers and valves are working. This procedure takes approximately one hour. There are no restrictions for this procedure.   Follow-Up: At [facility], you and your health needs are our priority.  As part of our continuing mission to provide you with exceptional heart care, we have created designated Provider Care Teams.  These Care Teams include your primary Cardiologist (physician) and Advanced Practice Providers (APPs -  Physician Assistants and Nurse Practitioners) who all work together to provide you with the care you need, when you need it.  We recommend signing up for the patient portal called "MyChart".  Sign up information is provided on this After Visit Summary.  MyChart is used to connect with patients for Virtual Visits (Telemedicine).  Patients are able to view lab/test results, encounter notes, upcoming appointments, etc.  Non-urgent messages can be sent to your provider as well.   To learn more about what you can do with MyChart, go to [email or web address].    Your next appointment:   3-4 week(s)- can  have 04/17/20 at 11:40A or 3:40P  The format for your next appointment:   In Person  Provider:   You may see Sinclair Grooms, MD or one of the following Advanced Practice Providers on your designated Care Team:    Truitt Merle, NP  Cecilie Kicks, NP  Kathyrn Drown, NP    Other Instructions  [facility]varo Monitor Instructions   Your physician has requested you wear your ZIO patch monitor 3 days.   This is a single patch monitor.  [employer] supplies one patch monitor per enrollment.  Additional stickers are not available.   Please do not apply patch if you will be having a Nuclear Stress Test, Echocardiogram, Cardiac CT, MRI, or Chest Xray during the time frame you would be wearing the monitor. The patch cannot be worn during these tests.  You cannot remove and re-apply the ZIO XT patch monitor.   Your ZIO patch monitor will be sent USPS Priority mail from [facility] directly to your home address. The monitor may also be mailed to a PO BOX if home delivery is not available.   It may take 3-5 days to receive your monitor after you have been enrolled.   Once you have received you monitor, please review enclosed instructions.  Your monitor has already been registered assigning a specific monitor serial # to you.   Applying the monitor   Shave hair from upper left chest.   Hold abrader disc by orange tab.  Rub abrader in 40 strokes over left upper  chest as indicated in your monitor instructions.   Clean area with 4 enclosed alcohol pads .  Use all pads to assure are is cleaned thoroughly.  Let dry.   Apply patch as indicated in monitor instructions.  Patch will be place under collarbone on left side of chest with arrow pointing upward.   Rub patch adhesive wings for 2 minutes.Remove white label marked "1".  Remove white label marked "2".  Rub patch adhesive wings for 2 additional minutes.   While looking in a mirror, press and release button in center of patch.  A  small green light will flash 3-4 times .  This will be your only indicator the monitor has been turned on.     Do not shower for the first 24 hours.  You may shower after the first 24 hours.   Press button if you feel a symptom. You will hear a small click.  Record Date, Time and Symptom in the Patient Log Book.   When you are ready to remove patch, follow instructions on last 2 pages of Patient Log Book.  Stick patch monitor onto last page of Patient Log Book.   Place Patient Log Book in [place] box.  Use locking tab on box and tape box closed securely.  The [employer] and [employer] has [employer] on it.  Please place in mailbox as soon as possible.  Your physician should have your test results approximately 7 days after the monitor has been mailed back to [facility].   Call [place] at [phone] if you have questions regarding your ZIO XT patch monitor.  Call them immediately if you see an orange light blinking on your monitor.   If your monitor falls off in less than 4 days contact our Monitor department at [phone].  If your monitor becomes loose or falls off after 4 days call [employer] at [phone] for suggestions on securing your monitor.

## 2020-03-27 NOTE — Telephone Encounter (Signed)
Enrolled patient for a 3 day Zio XT monitor to be mailed to patients home  

## 2020-04-03 ENCOUNTER — Ambulatory Visit (INDEPENDENT_AMBULATORY_CARE_PROVIDER_SITE_OTHER): Payer: Medicare HMO

## 2020-04-03 DIAGNOSIS — I480 Paroxysmal atrial fibrillation: Secondary | ICD-10-CM | POA: Diagnosis not present

## 2020-04-11 ENCOUNTER — Other Ambulatory Visit: Payer: Self-pay

## 2020-04-11 ENCOUNTER — Encounter: Payer: Self-pay | Admitting: Sports Medicine

## 2020-04-11 ENCOUNTER — Ambulatory Visit: Payer: Medicare HMO | Admitting: Sports Medicine

## 2020-04-11 DIAGNOSIS — Z7901 Long term (current) use of anticoagulants: Secondary | ICD-10-CM

## 2020-04-11 DIAGNOSIS — M79672 Pain in left foot: Secondary | ICD-10-CM

## 2020-04-11 DIAGNOSIS — M79675 Pain in left toe(s): Secondary | ICD-10-CM

## 2020-04-11 DIAGNOSIS — M79671 Pain in right foot: Secondary | ICD-10-CM

## 2020-04-11 DIAGNOSIS — B351 Tinea unguium: Secondary | ICD-10-CM

## 2020-04-11 DIAGNOSIS — M79674 Pain in right toe(s): Secondary | ICD-10-CM

## 2020-04-11 DIAGNOSIS — L84 Corns and callosities: Secondary | ICD-10-CM

## 2020-04-11 DIAGNOSIS — E782 Mixed hyperlipidemia: Secondary | ICD-10-CM | POA: Insufficient documentation

## 2020-04-11 DIAGNOSIS — M545 Low back pain, unspecified: Secondary | ICD-10-CM | POA: Insufficient documentation

## 2020-04-11 NOTE — Progress Notes (Signed)
Subjective: Connie [name] is a 86 y.o. female patient seen today in office with complaint of painful thickened and elongated toenails and callus; unable to trim. Patient denies changes with medical history; still on Xalerto for Afib like before without any changes.  No new issues noted.  Patient Active Problem List   Diagnosis Date Noted  . Low back pain 04/11/2020  . Mixed hyperlipidemia 04/11/2020  . Onychomycosis 04/11/2020  . Chronic anticoagulation 05/11/2016  . Atrial fibrillation with rapid ventricular response ([place]) 06/24/2015  . Gout 10/30/2013  . Gout of big toe 10/30/2013  . Osteoarthritis of multiple joints 10/30/2013  . Breast cancer, right breast ([place]) 11/25/2012  . Urge incontinence of urine 04/24/2011  . OA (osteoarthritis) 04/24/2011  . Essential hypertension, benign 10/23/2010  . Pure hypercholesterolemia 10/23/2010    Current Outpatient Medications on File Prior to Visit  Medication Sig Dispense Refill  . acetaminophen (TYLENOL) 650 MG CR tablet Take 1,300 mg by mouth 2 (two) times daily.    Marland Kitchen allopurinol (ZYLOPRIM) 100 MG tablet TAKE TWO TABLETS BY MOUTH DAILY 180 tablet 0  . amiodarone (PACERONE) 200 MG tablet Take one tablet by mouth twice for 2 weeks, then decrease to once daily 104 tablet 3  . calcium carbonate (TUMS - DOSED IN MG ELEMENTAL CALCIUM) 500 MG chewable tablet Chew 1 tablet by mouth daily as needed for indigestion or heartburn. Reported on 07/10/2015    . Calcium Carbonate-Vitamin D 600-400 MG-UNIT tablet Take 1 tablet by mouth daily.     . colchicine 0.6 MG tablet Use as directed. Take two tablets by mouth at start of gout flare up. Then one hour later take one tablet by mouth.     . hydrochlorothiazide (MICROZIDE) 12.5 MG capsule Take 1 capsule (12.5 mg total) by mouth daily. 90 capsule 3  . Multiple Vitamins-Minerals (CENTRUM SILVER PO) Take 1 tablet by mouth daily.    Marland Kitchen omega-3 acid ethyl esters (LOVAZA) 1 g capsule Take 2 capsules (2 g  total) by mouth 2 (two) times daily. 120 capsule 5  . omeprazole (PRILOSEC) 20 MG capsule Take 1 capsule (20 mg total) by mouth daily. Reported on 05/23/2015    . pravastatin (PRAVACHOL) 40 MG tablet Take 1 tablet (40 mg total) by mouth daily. 90 tablet 1  . Rivaroxaban (XARELTO) 15 MG TABS tablet TAKE ONE TABLET BY MOUTH DAILY WITH SUPPER. 90 tablet 3  . tolterodine (DETROL LA) 4 MG 24 hr capsule TAKE ONE CAPSULE BY MOUTH DAILY 90 capsule 0  . diltiazem (CARDIZEM CD) 240 MG 24 hr capsule Take 1 capsule (240 mg total) by mouth daily. 90 capsule 3   No current facility-administered medications on file prior to visit.    Allergies  Allergen Reactions  . Cephalexin Itching  . Ciprofloxacin Itching  . Diclofenac Nausea Only    Upset stomach  . Shellfish-Derived Products Nausea And Vomiting  . Tape Rash    Objective: Physical Exam  General: Well developed, nourished, no acute distress, awake, alert and oriented x 3  Vascular: Dorsalis pedis artery 1/4 bilateral, Posterior tibial artery 0/4 bilateral, skin temperature warm to warm proximal to distal bilateral lower extremities, trace edema at ankles bilateral, pedal hair present bilateral.  Neurological: Gross sensation present via light touch bilateral.   Dermatological: Skin is warm, dry, and supple bilateral, Nails 1-10 are tender, long, thick, and discolored with mild subungal debris, no webspace macerations present bilateral, no open lesions present bilateral, minimal reactive keratosis medial aspect of right  bunion greater than plantar left forefoot. No signs of infection bilateral.  Abrasion to anterior shin on the right that appears to be healing well without any issues.  Musculoskeletal: Asymptomatic bunion boney deformities noted bilateral. Muscular strength within normal limits without painon range of motion. No pain with calf compression bilateral.   Assessment and Plan:  Problem List Items Addressed This Visit   None    Visit Diagnoses    Pain due to onychomycosis of toenails of both feet    -  Primary   Callus of foot       Foot pain, bilateral       Anticoagulant long-term use         -Examined patient.  -Re-Discussed continued care for painful mycotic toenails and callus -Mechanically debrided and reduced mycotic nails with sterile nail nipper and dremel nail file without incident and at no additional smooth callus plantar right foot using rotary bur without incident  -Continue with good supportive shoes for foot type like previous -Patient to return in 2.5 months for follow up evaluation or sooner if symptoms worsen.  Landis Martins, DPM

## 2020-04-17 DIAGNOSIS — I480 Paroxysmal atrial fibrillation: Secondary | ICD-10-CM | POA: Diagnosis not present

## 2020-04-23 ENCOUNTER — Ambulatory Visit (HOSPITAL_COMMUNITY): Payer: Medicare HMO | Attending: Cardiovascular Disease

## 2020-04-23 ENCOUNTER — Other Ambulatory Visit: Payer: Self-pay

## 2020-04-23 DIAGNOSIS — I480 Paroxysmal atrial fibrillation: Secondary | ICD-10-CM | POA: Diagnosis not present

## 2020-04-23 LAB — ECHOCARDIOGRAM COMPLETE
Area-P 1/2: 5.16 cm2
P 1/2 time: 448 msec
S' Lateral: 2 cm

## 2020-04-28 DIAGNOSIS — H1033 Unspecified acute conjunctivitis, bilateral: Secondary | ICD-10-CM | POA: Diagnosis not present

## 2020-04-28 DIAGNOSIS — J069 Acute upper respiratory infection, unspecified: Secondary | ICD-10-CM | POA: Diagnosis not present

## 2020-04-29 NOTE — Progress Notes (Signed)
Cardiology Office Note:    Date:  05/02/2020   ID:  Connie [name], DOB Oct 26, 1927, MRN [id number]  PCP:  Rita Ohara, MD  Cardiologist:  Sinclair Grooms, MD   Referring MD: Rita Ohara, MD   Chief Complaint  Patient presents with  . Atrial Fibrillation  . Hypertension    History of Present Illness:    Connie [name] is a 86 y.o. female with a hx of with a hx of hypertension, hyperlipidemia, high burden paroxysmal atrial fibrillation (monitor 04/2020)on anticoagulation, and initiation Amiodarone to suppress AF.  She has been having upper respiratory symptoms.  Otherwise no complaints.  She felt that her heart was out of rhythm last night.  As noted below the monitor, she had spontaneous conversion to normal sinus rhythm.  She is being loaded with amiodarone to better control her rhythm.  Echocardiogram to be did does not show any structural or functional abnormality.  Past Medical History:  Diagnosis Date  . Abnormal brain MRI 8/01   small hemorrhagic stroke and possible cavernous hemangioma (Dr. Erling Cruz)  . Adenomatous colon polyp 1/03  . Arthritis    OA spine, hands  . Breast cancer ([place]) 2001   R breast (T3N1, ER/PR+, HER-2 +) s/p mastectomy, chemo and chest wall irradiation (Dr. Truddie Coco)  . DDD (degenerative disc disease), lumbar   . GERD (gastroesophageal reflux disease)   . Hearing loss 2012   wears hearing aids  . Herpes zoster 2003  . Hyperlipidemia   . Hypertension   . Onychomycosis 2003, 2008   treated with Lamisil  . Osteopenia    (DEXA's done by Dr. Truddie Coco); prev took Fosamax.  No change in DEXA after off Fosamax x 2 years  . Ovarian cyst 12/2008   right  . Personal history of chemotherapy   . Personal history of radiation therapy   . Postmenopausal bleeding 2000   benign EMB (Dr. Raphael Gibney)  . Urge urinary incontinence     Past Surgical History:  Procedure Laterality Date  . CATARACT EXTRACTION  summer 2011   bilateral  . CHOLECYSTECTOMY,  LAPAROSCOPIC  1/05  . COLONOSCOPY  12/17/08   normal (Dr. Wynetta Emery)  . COLONOSCOPY W/ POLYPECTOMY  05/23/01   adenomatous polyp  . MASTECTOMY Right 2001  . modified mastectomy and tram flap reconstruction  2001   R breast  . TONSILLECTOMY    . tram flap removal     problems with flap after radiation  . TUBAL LIGATION      Current Medications: Current Meds  Medication Sig  . acetaminophen (TYLENOL) 650 MG CR tablet Take 1,300 mg by mouth 2 (two) times daily.  Marland Kitchen allopurinol (ZYLOPRIM) 100 MG tablet TAKE TWO TABLETS BY MOUTH DAILY  . amiodarone (PACERONE) 200 MG tablet Take one tablet by mouth twice for 2 weeks, then decrease to once daily  . calcium carbonate (TUMS - DOSED IN MG ELEMENTAL CALCIUM) 500 MG chewable tablet Chew 1 tablet by mouth daily as needed for indigestion or heartburn. Reported on 07/10/2015  . Calcium Carbonate-Vitamin D 600-400 MG-UNIT tablet Take 1 tablet by mouth daily.   . colchicine 0.6 MG tablet Use as directed. Take two tablets by mouth at start of gout flare up. Then one hour later take one tablet by mouth.   . diltiazem (CARDIZEM CD) 240 MG 24 hr capsule Take 1 capsule (240 mg total) by mouth daily.  Marland Kitchen doxycycline (VIBRA-TABS) 100 MG tablet Take 100 mg by mouth 2 (two) times daily.  Marland Kitchen  hydrochlorothiazide (MICROZIDE) 12.5 MG capsule Take 1 capsule (12.5 mg total) by mouth daily.  . Multiple Vitamins-Minerals (CENTRUM SILVER PO) Take 1 tablet by mouth daily.  Marland Kitchen omega-3 acid ethyl esters (LOVAZA) 1 g capsule Take 2 capsules (2 g total) by mouth 2 (two) times daily.  Marland Kitchen omeprazole (PRILOSEC) 20 MG capsule Take 1 capsule (20 mg total) by mouth daily. Reported on 05/23/2015  . pravastatin (PRAVACHOL) 40 MG tablet Take 1 tablet (40 mg total) by mouth daily.  . Rivaroxaban (XARELTO) 15 MG TABS tablet TAKE ONE TABLET BY MOUTH DAILY WITH SUPPER.  Marland Kitchen tolterodine (DETROL LA) 4 MG 24 hr capsule TAKE ONE CAPSULE BY MOUTH DAILY     Allergies:   Cephalexin, Ciprofloxacin,  Diclofenac, Shellfish-derived products, and Tape   Social History   Socioeconomic History  . Marital status: Married    Spouse name: Not on file  . Number of children: 3  . Years of education: Not on file  . Highest education level: Not on file  Occupational History  . Not on file  Tobacco Use  . Smoking status: Never Smoker  . Smokeless tobacco: Never Used  Vaping Use  . Vaping Use: Never used  Substance and Sexual Activity  . Alcohol use: No  . Drug use: No  . Sexual activity: Not Currently    Comment: due to poor libido and pain  Other Topics Concern  . Not on file  Social History Narrative   Moved 06/2018 to [place]. Lives with husband.   1 daughter at [facility]--hospitalist; daughter in [place] (works in Conservator, museum/gallery at [employer] took in Barrister's clerk), son in [place]. 8 grandchildren, 4 great-grandchildren      Updated 07/2019   Social Determinants of Health   Financial Resource Strain: Not on file  Food Insecurity: Not on file  Transportation Needs: Not on file  Physical Activity: Not on file  Stress: Not on file  Social Connections: Not on file     Family History: The patient's family history includes Diabetes in her brother; Heart disease in her brother, father, and mother; Hypertension in her father and mother. There is no history of Drug abuse, Cancer, or Breast cancer.  ROS:   Please see the history of present illness.    Nasal congestion and stuffiness.  All other systems reviewed and are negative.  EKGs/Labs/Other Studies Reviewed:    The following studies were reviewed today:  ECHOCARDIOGRAM 04/23/2020: IMPRESSIONS    1. Left ventricular ejection fraction, by estimation, is 55 to 60%. The  left ventricle has normal function. The left ventricle has no regional  wall motion abnormalities. Left ventricular diastolic function could not  be evaluated.  2. Right ventricular systolic function is normal. The right ventricular  size is normal. There is  normal pulmonary artery systolic pressure. The  estimated right ventricular systolic pressure is 29.9 mmHg.  3. Left atrial size was mildly dilated.  4. A small pericardial effusion is present. The pericardial effusion is  circumferential. There is no evidence of cardiac tamponade.  5. The mitral valve is degenerative. Trivial mitral valve regurgitation.  No evidence of mitral stenosis. Moderate mitral annular calcification.  6. The aortic valve is tricuspid. There is mild calcification of the  aortic valve. There is mild thickening of the aortic valve. Aortic valve  regurgitation is mild. Mild aortic valve sclerosis is present, with no  evidence of aortic valve stenosis.  7. The inferior vena cava is normal in size with greater  than 50%  respiratory variability, suggesting right atrial pressure of 3 mmHg.   4 DAY MONITOR 04/03/2020: Study Highlights    Atrial fibrillation predominated with 75% burden. Ave HR 93 bpm, range 63-143 bpm  NSVT longest 5 beats. PVC burden < 1%  No pauses noted  Non sustaianed SVT also noted, longest 15 beats < 150 bpm   High burden of AF   EKG:  EKG coarse atrial fibrillation with moderate rate control at 91 bpm.  Recent Labs: 05/22/2019: Magnesium 2.3 12/25/2019: Hemoglobin 13.2; Platelets 186; TSH 2.260 01/11/2020: ALT 17; BUN 30; Creatinine, Ser 1.20; Potassium 4.5; Sodium 140  Recent Lipid Panel    Component Value Date/Time   CHOL 139 02/15/2020 0956   TRIG 139 02/15/2020 0956   HDL 39 (L) 02/15/2020 0956   CHOLHDL 3.6 02/15/2020 0956   CHOLHDL 3.6 03/18/2017 1343   VLDL 41 (H) 12/08/2016 0755   LDLCALC 75 02/15/2020 0956   LDLCALC 84 03/18/2017 1343    Physical Exam:    VS:  BP 132/76   Pulse 91   Ht _0  (1.575 m)   Wt 157 lb 9.6 oz (71.5 kg)   SpO2 97%   BMI 28.83 kg/m     Wt Readings from Last 3 Encounters:  05/02/20 157 lb 9.6 oz (71.5 kg)  03/27/20 135 lb 3.2 oz (61.3 kg)  02/26/20 164 lb (74.4 kg)     GEN:  Compatible with age. No acute distress HEENT: Normal NECK: No JVD. LYMPHATICS: No lymphadenopathy CARDIAC: No murmur. IIRR no gallop, or edema. VASCULAR:  Normal Pulses. No bruits. RESPIRATORY:  Clear to auscultation without rales, wheezing or rhonchi  ABDOMEN: Soft, non-tender, non-distended, No pulsatile mass, MUSCULOSKELETAL: No deformity  SKIN: Warm and dry NEUROLOGIC:  Alert and oriented x 3 PSYCHIATRIC:  Normal affect   ASSESSMENT:    1. PAF (paroxysmal atrial fibrillation) ([place])   2. Chronic anticoagulation   3. Long term current use of amiodarone   4. Essential hypertension   5. Pure hypercholesterolemia   6. Educated about COVID-19 virus infection    PLAN:    In order of problems listed above:  1. Increase amiodarone to 200 mg twice daily for the next 3 weeks.  Return for office visit prior to switching amiodarone back to 200 mg/day.  After she is fully loaded, if maintaining sinus rhythm, may have to consider cardioversion.  She has had spontaneous conversion in the past and hopefully this will occur before the next office visit.  EKG will be required. 2. No change in anticoagulation regimen, Xarelto 15 mg/day.. 3. She had 2 weeks of amiodarone 200 mg twice daily and then decreased to 200 mg daily.  She is in A. fib today.  Dose is increased back to 200 mg twice daily.  No other changes were made. 4. Blood pressure control is excellent.  Continue diltiazem and Microzide 5. Continue Pravachol 40 mg/day. 6. Vaccinated, boosted, and practicing medication.   Medication Adjustments/Labs and Tests Ordered: Current medicines are reviewed at length with the patient today.  Concerns regarding medicines are outlined above.  Orders Placed This Encounter  Procedures  . EKG 12-Lead   No orders of the defined types were placed in this encounter.   Patient Instructions  Medication Instructions:  1) Go back to taking Amiodarone 253m twice daily for 3 weeks.  We will have you  come back in to see Dr. STamala Julianprior to dose change.  *If you need a refill on your cardiac  medications before your next appointment, please call your pharmacy*   Lab Work: None If you have labs (blood work) drawn today and your tests are completely normal, you will receive your results only by: Marland Kitchen MyChart Message (if you have MyChart) OR . A paper copy in the mail If you have any lab test that is abnormal or we need to change your treatment, we will call you to review the results.   Testing/Procedures: None   Follow-Up: At [facility], you and your health needs are our priority.  As part of our continuing mission to provide you with exceptional heart care, we have created designated Provider Care Teams.  These Care Teams include your primary Cardiologist (physician) and Advanced Practice Providers (APPs -  Physician Assistants and Nurse Practitioners) who all work together to provide you with the care you need, when you need it.  We recommend signing up for the patient portal called "MyChart".  Sign up information is provided on this After Visit Summary.  MyChart is used to connect with patients for Virtual Visits (Telemedicine).  Patients are able to view lab/test results, encounter notes, upcoming appointments, etc.  Non-urgent messages can be sent to your provider as well.   To learn more about what you can do with MyChart, go to [email or web address].    Your next appointment:   2-3 week(s)  The format for your next appointment:   In Person  Provider:   You may see Sinclair Grooms, MD or one of the following Advanced Practice Providers on your designated Care Team:    Truitt Merle, NP  Cecilie Kicks, NP  Kathyrn Drown, NP    Other Instructions      Signed, Sinclair Grooms, MD  05/02/2020 5:05 PM    [place]

## 2020-05-02 ENCOUNTER — Encounter: Payer: Self-pay | Admitting: Interventional Cardiology

## 2020-05-02 ENCOUNTER — Other Ambulatory Visit: Payer: Self-pay

## 2020-05-02 ENCOUNTER — Ambulatory Visit: Payer: Medicare HMO | Admitting: Interventional Cardiology

## 2020-05-02 VITALS — BP 132/76 | HR 91 | Ht 62.0 in | Wt 157.6 lb

## 2020-05-02 DIAGNOSIS — Z7189 Other specified counseling: Secondary | ICD-10-CM

## 2020-05-02 DIAGNOSIS — Z79899 Other long term (current) drug therapy: Secondary | ICD-10-CM | POA: Diagnosis not present

## 2020-05-02 DIAGNOSIS — I10 Essential (primary) hypertension: Secondary | ICD-10-CM | POA: Diagnosis not present

## 2020-05-02 DIAGNOSIS — I480 Paroxysmal atrial fibrillation: Secondary | ICD-10-CM | POA: Diagnosis not present

## 2020-05-02 DIAGNOSIS — Z7901 Long term (current) use of anticoagulants: Secondary | ICD-10-CM | POA: Diagnosis not present

## 2020-05-02 DIAGNOSIS — E7800 Pure hypercholesterolemia, unspecified: Secondary | ICD-10-CM

## 2020-05-02 NOTE — Patient Instructions (Signed)
Medication Instructions:  1) Go back to taking Amiodarone 200mg  twice daily for 3 weeks.  We will have you come back in to see Dr. prior to dose change.  *If you need a refill on your cardiac medications before your next appointment, please call your pharmacy*   Lab Work: None If you have labs (blood work) drawn today and your tests are completely normal, you will receive your results only by: Katrinka Blazing MyChart Message (if you have MyChart) OR . A paper copy in the mail If you have any lab test that is abnormal or we need to change your treatment, we will call you to review the results.   Testing/Procedures: None   Follow-Up: At [facility], you and your health needs are our priority.  As part of our continuing mission to provide you with exceptional heart care, we have created designated Provider Care Teams.  These Care Teams include your primary Cardiologist (physician) and Advanced Practice Providers (APPs -  Physician Assistants and Nurse Practitioners) who all work together to provide you with the care you need, when you need it.  We recommend signing up for the patient portal called "MyChart".  Sign up information is provided on this After Visit Summary.  MyChart is used to connect with patients for Virtual Visits (Telemedicine).  Patients are able to view lab/test results, encounter notes, upcoming appointments, etc.  Non-urgent messages can be sent to your provider as well.   To learn more about what you can do with MyChart, go to [facility].    Your next appointment:   2-3 week(s)  The format for your next appointment:   In Person  Provider:   You may see [email or web address], MD or one of the following Advanced Practice Providers on your designated Care Team:    Lesleigh Noe, NP  Norma Fredrickson, NP  Nada Boozer, NP    Other Instructions

## 2020-05-12 NOTE — Progress Notes (Signed)
Cardiology Office Note:    Date:  05/17/2020   ID:  Connie [name], DOB Sep 10, 1927, MRN [id number]  PCP:  Rita Ohara, MD  Cardiologist:  Sinclair Grooms, MD   Referring MD: Rita Ohara, MD   Chief Complaint  Patient presents with  . Atrial Fibrillation    History of Present Illness:    Connie [name] is a 87 y.o. female with a hx of hypertension, hyperlipidemia, high burden paroxysmal atrial fibrillation (monitor 04/2020)on anticoagulation, and initiation Amiodarone to suppress AF.  Back to see if AF resolves on amiodarone, has had few episodes of racing heart.  EKG today demonstrates sinus rhythm.  She had 1 episode of atrial fibrillation on January 12.  Today she feels great.  Past Medical History:  Diagnosis Date  . Abnormal brain MRI 8/01   small hemorrhagic stroke and possible cavernous hemangioma (Dr. Erling Cruz)  . Adenomatous colon polyp 1/03  . Arthritis    OA spine, hands  . Breast cancer ([place]) 2001   R breast (T3N1, ER/PR+, HER-2 +) s/p mastectomy, chemo and chest wall irradiation (Dr. Truddie Coco)  . DDD (degenerative disc disease), lumbar   . GERD (gastroesophageal reflux disease)   . Hearing loss 2012   wears hearing aids  . Herpes zoster 2003  . Hyperlipidemia   . Hypertension   . Onychomycosis 2003, 2008   treated with Lamisil  . Osteopenia    (DEXA's done by Dr. Truddie Coco); prev took Fosamax.  No change in DEXA after off Fosamax x 2 years  . Ovarian cyst 12/2008   right  . Personal history of chemotherapy   . Personal history of radiation therapy   . Postmenopausal bleeding 2000   benign EMB (Dr. Raphael Gibney)  . Urge urinary incontinence     Past Surgical History:  Procedure Laterality Date  . CATARACT EXTRACTION  summer 2011   bilateral  . CHOLECYSTECTOMY, LAPAROSCOPIC  1/05  . COLONOSCOPY  12/17/08   normal (Dr. Wynetta Emery)  . COLONOSCOPY W/ POLYPECTOMY  05/23/01   adenomatous polyp  . MASTECTOMY Right 2001  . modified mastectomy and tram flap  reconstruction  2001   R breast  . TONSILLECTOMY    . tram flap removal     problems with flap after radiation  . TUBAL LIGATION      Current Medications: Current Meds  Medication Sig  . acetaminophen (TYLENOL) 650 MG CR tablet Take 1,300 mg by mouth 2 (two) times daily.  Marland Kitchen allopurinol (ZYLOPRIM) 100 MG tablet TAKE TWO TABLETS BY MOUTH DAILY  . amiodarone (PACERONE) 200 MG tablet Take one tablet by mouth twice for 2 weeks, then decrease to once daily  . calcium carbonate (TUMS - DOSED IN MG ELEMENTAL CALCIUM) 500 MG chewable tablet Chew 1 tablet by mouth daily as needed for indigestion or heartburn. Reported on 07/10/2015  . Calcium Carbonate-Vitamin D 600-400 MG-UNIT tablet Take 1 tablet by mouth daily.   . colchicine 0.6 MG tablet Use as directed. Take two tablets by mouth at start of gout flare up. Then one hour later take one tablet by mouth.   . diltiazem (CARDIZEM CD) 240 MG 24 hr capsule Take 1 capsule (240 mg total) by mouth daily.  . hydrochlorothiazide (MICROZIDE) 12.5 MG capsule Take 1 capsule (12.5 mg total) by mouth daily.  . Multiple Vitamins-Minerals (CENTRUM SILVER PO) Take 1 tablet by mouth daily.  Marland Kitchen omeprazole (PRILOSEC) 20 MG capsule Take 1 capsule (20 mg total) by mouth daily. Reported on 05/23/2015  .  pravastatin (PRAVACHOL) 40 MG tablet Take 1 tablet (40 mg total) by mouth daily.  . Rivaroxaban (XARELTO) 15 MG TABS tablet TAKE ONE TABLET BY MOUTH DAILY WITH SUPPER.  Marland Kitchen tolterodine (DETROL LA) 4 MG 24 hr capsule TAKE ONE CAPSULE BY MOUTH DAILY     Allergies:   Cephalexin, Ciprofloxacin, Diclofenac, Shellfish-derived products, and Tape   Social History   Socioeconomic History  . Marital status: Married    Spouse name: Not on file  . Number of children: 3  . Years of education: Not on file  . Highest education level: Not on file  Occupational History  . Not on file  Tobacco Use  . Smoking status: Never Smoker  . Smokeless tobacco: Never Used  Vaping Use  .  Vaping Use: Never used  Substance and Sexual Activity  . Alcohol use: No  . Drug use: No  . Sexual activity: Not Currently    Comment: due to poor libido and pain  Other Topics Concern  . Not on file  Social History Narrative   Moved 06/2018 to [place]. Lives with husband.   1 daughter at [facility]--hospitalist; daughter in [place] (works in Conservator, museum/gallery at [employer] took in Barrister's clerk), son in [place]. 8 grandchildren, 4 great-grandchildren      Updated 07/2019   Social Determinants of Health   Financial Resource Strain: Not on file  Food Insecurity: Not on file  Transportation Needs: Not on file  Physical Activity: Not on file  Stress: Not on file  Social Connections: Not on file     Family History: The patient's family history includes Diabetes in her brother; Heart disease in her brother, father, and mother; Hypertension in her father and mother. There is no history of Drug abuse, Cancer, or Breast cancer.  ROS:   Please see the history of present illness.    No bleeding or neurological symptoms.  All other systems reviewed and are negative.  EKGs/Labs/Other Studies Reviewed:    The following studies were reviewed today: No new data  EKG:  EKG sinus rhythm with first-degree AV block.  Recent Labs: 05/22/2019: Magnesium 2.3 12/25/2019: Hemoglobin 13.2; Platelets 186; TSH 2.260 01/11/2020: ALT 17; BUN 30; Creatinine, Ser 1.20; Potassium 4.5; Sodium 140  Recent Lipid Panel    Component Value Date/Time   CHOL 139 02/15/2020 0956   TRIG 139 02/15/2020 0956   HDL 39 (L) 02/15/2020 0956   CHOLHDL 3.6 02/15/2020 0956   CHOLHDL 3.6 03/18/2017 1343   VLDL 41 (H) 12/08/2016 0755   LDLCALC 75 02/15/2020 0956   LDLCALC 84 03/18/2017 1343    Physical Exam:    VS:  BP 122/64   Pulse 69   Ht 5' (1.524 m)   Wt 158 lb 3.2 oz (71.8 kg)   SpO2 98%   BMI 30.90 kg/m     Wt Readings from Last 3 Encounters:  05/17/20 158 lb 3.2 oz (71.8 kg)  05/02/20 157 lb 9.6 oz (71.5 kg)   03/27/20 135 lb 3.2 oz (61.3 kg)     GEN: Younger than stated age in appearance. No acute distress HEENT: Normal NECK: No JVD. LYMPHATICS: No lymphadenopathy CARDIAC: No murmur. RRR S4 but no S3 gallop, or edema. VASCULAR: Normal Pulses. No bruits. RESPIRATORY:  Clear to auscultation without rales, wheezing or rhonchi  ABDOMEN: Soft, non-tender, non-distended, No pulsatile mass, MUSCULOSKELETAL: No deformity  SKIN: Warm and dry NEUROLOGIC:  Alert and oriented x 3 PSYCHIATRIC:  Normal affect   ASSESSMENT:  1. PAF (paroxysmal atrial fibrillation) ([place])   2. Chronic anticoagulation   3. Long term current use of amiodarone   4. Essential hypertension   5. Pure hypercholesterolemia   6. Educated about COVID-19 virus infection    PLAN:    In order of problems listed above:  1. Rhythm control is improving on amiodarone.  Continue 200 mg twice daily for 1 additional week then down to 200 mg/day.  She can double up if she develops recurrent atrial fibrillation but needs to notify us.  She can do this up to a week at a time.  We will plan to see her back in mid March.  We will get TSH and and liver panel. 2. Blood pressure control is really important.  She wonders if she can discontinue diltiazem.  Generally speaking she has frequent blood pressure recordings greater than 045 systolic.  For the time being continue diltiazem and hydrochlorothiazide.  If she documents a significant number of blood pressure recordings at her side of 913 mmHg systolic, will likely decrease diltiazem dose. 3. Continue Eliquis 4. See dialogue above under #2 concerning diltiazem 5. Did not discuss 6. Vaccinated and boosted.  Planning to go on a cruise.  This may not be the best idea.  I let them know that I without for them with the cruise line if they decided to cancel if positivity rates and surgery is still ongoing.  Even though they are boosted they are elderly and could be at high risk for breakthrough  infection in such a tight situation.   Medication Adjustments/Labs and Tests Ordered: Current medicines are reviewed at length with the patient today.  Concerns regarding medicines are outlined above.  Orders Placed This Encounter  Procedures  . EKG 12-Lead   No orders of the defined types were placed in this encounter.   There are no Patient Instructions on file for this visit.   Signed, Sinclair Grooms, MD  05/17/2020 8:28 AM    [place]

## 2020-05-17 ENCOUNTER — Other Ambulatory Visit: Payer: Self-pay

## 2020-05-17 ENCOUNTER — Encounter: Payer: Self-pay | Admitting: Interventional Cardiology

## 2020-05-17 ENCOUNTER — Ambulatory Visit: Payer: Medicare HMO | Admitting: Interventional Cardiology

## 2020-05-17 VITALS — BP 122/64 | HR 69 | Ht 60.0 in | Wt 158.2 lb

## 2020-05-17 DIAGNOSIS — I10 Essential (primary) hypertension: Secondary | ICD-10-CM

## 2020-05-17 DIAGNOSIS — Z7189 Other specified counseling: Secondary | ICD-10-CM

## 2020-05-17 DIAGNOSIS — Z7901 Long term (current) use of anticoagulants: Secondary | ICD-10-CM

## 2020-05-17 DIAGNOSIS — Z79899 Other long term (current) drug therapy: Secondary | ICD-10-CM | POA: Diagnosis not present

## 2020-05-17 DIAGNOSIS — I480 Paroxysmal atrial fibrillation: Secondary | ICD-10-CM

## 2020-05-17 DIAGNOSIS — E7800 Pure hypercholesterolemia, unspecified: Secondary | ICD-10-CM | POA: Diagnosis not present

## 2020-05-17 NOTE — Patient Instructions (Signed)
Medication Instructions:  1) Continue Amiodarone 200mg  twice daily for another week and then decrease to once daily.  *If you need a refill on your cardiac medications before your next appointment, please call your pharmacy*   Lab Work: None If you have labs (blood work) drawn today and your tests are completely normal, you will receive your results only by: Marland Kitchen MyChart Message (if you have MyChart) OR . A paper copy in the mail If you have any lab test that is abnormal or we need to change your treatment, we will call you to review the results.   Testing/Procedures: None   Follow-Up: At [facility], you and your health needs are our priority.  As part of our continuing mission to provide you with exceptional heart care, we have created designated Provider Care Teams.  These Care Teams include your primary Cardiologist (physician) and Advanced Practice Providers (APPs -  Physician Assistants and Nurse Practitioners) who all work together to provide you with the care you need, when you need it.  We recommend signing up for the patient portal called "MyChart".  Sign up information is provided on this After Visit Summary.  MyChart is used to connect with patients for Virtual Visits (Telemedicine).  Patients are able to view lab/test results, encounter notes, upcoming appointments, etc.  Non-urgent messages can be sent to your provider as well.   To learn more about what you can do with MyChart, go to [email or web address].    Your next appointment:   2 month(s)  The format for your next appointment:   In Person  Provider:   You may see Sinclair Grooms, MD or one of the following Advanced Practice Providers on your designated Care Team:    Cecilie Kicks, NP  Kathyrn Drown, NP    Other Instructions

## 2020-05-21 ENCOUNTER — Other Ambulatory Visit: Payer: Self-pay

## 2020-05-21 ENCOUNTER — Ambulatory Visit (INDEPENDENT_AMBULATORY_CARE_PROVIDER_SITE_OTHER): Payer: Medicare HMO | Admitting: Family Medicine

## 2020-05-21 ENCOUNTER — Ambulatory Visit
Admission: RE | Admit: 2020-05-21 | Discharge: 2020-05-21 | Disposition: A | Discharge status: Home / Self Care | Payer: Medicare HMO | Source: Ambulatory Visit | Attending: Family Medicine | Admitting: Family Medicine

## 2020-05-21 ENCOUNTER — Encounter: Payer: Self-pay | Admitting: Family Medicine

## 2020-05-21 VITALS — BP 190/94 | HR 71 | Temp 97.5°F | Wt 159.0 lb

## 2020-05-21 DIAGNOSIS — M545 Low back pain, unspecified: Secondary | ICD-10-CM | POA: Diagnosis not present

## 2020-05-21 DIAGNOSIS — M1990 Unspecified osteoarthritis, unspecified site: Secondary | ICD-10-CM | POA: Diagnosis not present

## 2020-05-21 NOTE — Patient Instructions (Signed)
you can take 2 Tylenol 4 times per day.  Do not take more than 3 g of Tylenol per day Heat for 20 minutes 3 times per day and gentle stretching after that okay

## 2020-05-21 NOTE — Progress Notes (Signed)
   Subjective:    Patient ID: Connie [name], female    DOB: 1928-04-13, 87 y.o.   MRN: [id number]  HPI She is here for evaluation of a several day history of the onset of low back pain mainly on the left with increasing pain.  She states that it does radiate into the buttock and down the thigh slightly.  No numbness, tingling or weakness.  There is pain on motion of her back.  She has not had any falls.  No previous episodes of this.  Massage did not help.  She has been using Tylenol but low doses.  She has underlying A. fib and is presently on Xarelto.   Review of Systems     Objective:   Physical Exam Alert and in no distress.  Slight tenderness to palpation over the left SI joint.  Provocative testing was negative.  Good hip motion.  Negative straight leg raising with normal DTRs.  Normal strength. X-rays were ordered and did show significant degenerative arthritis.     Assessment & Plan:  Acute left-sided low back pain without sciatica - Plan: DG Lumbar Spine Complete  you can take 2 Tylenol 4 times per day.  Do not take more than 3 g of Tylenol per day Heat for 20 minutes 3 times per day and gentle stretching after that okay Stretching techniques were discussed with the patient and I demonstrated them. She is to do this for the next 7 to 10 days and let me know how she is doing. Case was also discussed with her daughter, Theodoro Doing, MD

## 2020-06-10 ENCOUNTER — Other Ambulatory Visit: Payer: Self-pay | Admitting: Nurse Practitioner

## 2020-06-10 ENCOUNTER — Other Ambulatory Visit: Payer: Self-pay | Admitting: Family Medicine

## 2020-06-12 ENCOUNTER — Ambulatory Visit (INDEPENDENT_AMBULATORY_CARE_PROVIDER_SITE_OTHER): Payer: Medicare HMO | Admitting: Family Medicine

## 2020-06-12 ENCOUNTER — Encounter: Payer: Self-pay | Admitting: Family Medicine

## 2020-06-12 ENCOUNTER — Other Ambulatory Visit: Payer: Self-pay

## 2020-06-12 VITALS — BP 130/60 | HR 72 | Ht 60.0 in | Wt 157.0 lb

## 2020-06-12 DIAGNOSIS — M545 Low back pain, unspecified: Secondary | ICD-10-CM

## 2020-06-12 DIAGNOSIS — Z7189 Other specified counseling: Secondary | ICD-10-CM | POA: Diagnosis not present

## 2020-06-12 DIAGNOSIS — Z7184 Encounter for health counseling related to travel: Secondary | ICD-10-CM

## 2020-06-12 DIAGNOSIS — E782 Mixed hyperlipidemia: Secondary | ICD-10-CM | POA: Diagnosis not present

## 2020-06-12 MED ORDER — ICOSAPENT ETHYL 1 G PO CAPS: 2.0000 g | ORAL_CAPSULE | Freq: Two times a day (BID) | ORAL | 0 refills | Status: DC

## 2020-06-12 NOTE — Patient Instructions (Signed)
Continue the discussion we started about DNR and the MOST form with Hinton Dyer. Think about things, and we will finalize your paperwork in March.  We gave you prescription to get physical [facility] through [facility].  z-pak can potentially cause a cardiac issue (electrical issue), when used with amiodarone. Here is some other information about traveler's diarrhea.   Food Poisoning and Traveling Food poisoning is an illness that is caused by eating or drinking something that has been contaminated with toxins. Some types of food poisoning trigger symptoms within a few hours. Others may take 1-2 weeks for symptoms to appear. Symptoms of food poisoning may include:  Diarrhea.  Cramping or abdominal pain.  Nausea and vomiting.  Fever.  Dizziness.  Aches and pains. Before you travel, learn about the foodborne illnesses that are common in the areas you will be visiting. The risk for food poisoning varies from country to country and from one region of the world to another. What types of illness can be passed through food and drinks? Most cases of food poisoning are caused by bacteria or viruses. Untreated, these cases usually last 2-7 days. Food poisoning can also be caused by some microscopic parasites. Parasites are organisms that live off another larger organism. Illness caused by parasites can take 1-2 weeks to appear and may last several months.   What actions can I take to lower my risk of food poisoning while traveling?  Wash your hands with soap and water often, especially before eating food.  Carry travel-size bottles of alcohol-based hand sanitizer. Use it when you do not have access to soap and water.  Understand what foods and drinks are generally safe, and what foods and drinks to avoid. Foods that are generally safe  Food that is thoroughly cooked.  Food that is served hot.  Hard-boiled eggs.  Fruits and vegetables that you wash and peel yourself.  Cheese that  has been treated with high heat (pasteurized). Drinks that are generally safe  Pasteurized milk.  Bottled waters, soda, or sports drinks.  Drinks that you know were sealed until you opened them.  Water that you know has been treated, boiled, or filtered to remove microorganisms.  Ice from treated or bottled water.  Drinks made with boiling water, such as tea or coffee. Foods to avoid  Raw or undercooked foods.  Raw or runny eggs.  Food that is not hot, such as food that has been on a buffet or picnic table for a while.  Raw fruits or vegetables that have not been washed and peeled.  Other items made with fresh vegetables or fruits, such as salad and salsa.  Cheese that has not been pasteurized.  Meat from local animals, such as monkeys and bats. Drinks to avoid  Water from the tap or a well.  Water from a fresh-water source, such as a stream.  Ice from a tap, well, or fresh-water source.  Beverages that include water from a well, tap, or fresh-water source.  Milk that is not pasteurized.  Beverages from soda fountains.   What countries have a higher risk for food poisoning? Countries with a high risk  Countries in [place].  Countries in the [place] in [place].  [place].  Countries in [place] and [place]. Countries with a mid-range risk  Countries in [place].  [place].  Some [place]. Countries with a low risk  The [place].  [place].  [place].  [place].  [place].  Some  countries in [place] or [place]. Questions to ask your health care provider  What is my personal risk for getting food poisoning while traveling?  Can you prescribe medicines to prevent food poisoning or to treat symptoms like diarrhea?  How do I take the medicines you prescribed?  What over-the-counter medicines can I buy to help with food poisoning?  How do I take care of myself if I think I have food poisoning  while traveling?  Does the area I am traveling to have a low, medium, or high risk for foodborne illness? Where to find more information  Visit a travel medicine clinic or speak with a health care provider who specializes in travel medicine as soon as you know your travel plans.  Check the Travelers' Health section on the website of the Centers for Disease Control and Prevention (CDC): WaveHands.com.ee Contact a health care provider if:  You develop symptoms of food poisoning during or after travel. Summary  Food poisoning is an illness that is caused by eating or drinking something contaminated with toxins.  Symptoms can occur within hours or may take 1-2 weeks to appear. Symptoms include diarrhea, cramping, fever, nausea and vomiting, dizziness, aches, and pains.  Before you travel, learn about the foodborne illnesses that are common in the areas you intend to visit. Consider visiting a travel medicine clinic before your trip.  While traveling, be aware of which foods and drinks are generally safe or should be avoided.  Wash your hands with soap and water often. Use alcohol-based hand sanitizer if you do not have access to soap and water. This information is not intended to replace advice given to you by your health care provider. Make sure you discuss any questions you have with your health care provider. Document Revised: 02/01/2020 Document Reviewed: 02/01/2020 Elsevier Patient Education  [place].

## 2020-06-12 NOTE — Progress Notes (Signed)
Chief Complaint  Patient presents with  . Back Pain    Low back pain, left sided. Saw JCL last month and had xray-still not better. Leaving for trip end of this month. Daughter thinks maybe PT might help. [facility] offers-just needs written and signed rx. Trip at the end of the month is a cruise and she needs abx for potential diarhhea. Need MOST FORM AND DNR copies. (I made copy of MOST form-cannot find DNR in chart).   . Hyperlipidemia    Has not been taking any fish oil-insurance sent letter back in Dec and they will pay for Vascepa (have been waiting for her to call me back). She is ready for rx to be sent on this medication. She would like to do a local pharmacy to try it out before sending 90 day to mail order.    She saw Dr. Redmond School 1/18 with several days of LBP, on left. It was radiating to buttock, slightly down the thigh. Pain is worse in the evening.  No significant radiation now. No associated numbness, tingling, weakness. No changes in voiding. Having some constipation, hard stools. She is using Miralax and Metamucil, which seems to help.  She tries to drink water (husband brings her a mug 2x/day while she is reading). No known injury or change in activity. X-rays: IMPRESSION: 1. No acute osseous abnormality. 2. Advanced degenerative disc disease at L4-L5 and L5-S1.  He advised Tylenol, heat and stretches. Tylenol Arthritis helps, but she often forgets the middle of the day dose. Had a massage once that made it worse (more radiation). She is interested in starting physical therapy--offered [facility], through [facility], needs written rx.  Lovaza no longer covered, need to change to Vascepa. She admits she hasn't taken Lovaza for 6 months--caused reflux/heartburn, though not as bad as the regular fish oil. Wants to try local pharmacy before getting 90d mail order of Vascepa  She will be going on a [place] cruise out of [place] for her husband's 90th birthday,  with their family. Asking for antibiotics to take just in case, likes to have this on hand in case of diarrhea.  She can't take cipro or keflex, and this is what the ships carry (so she likes to have another on hand, just in case) Chart reviewed--previously given z-pak, though is on amiodarone, so not recommended (potential for QT prolongation).  Most recent ABX was doxycycline, but was for respiratory (or skin?) issue  PMH, PSH, SH reviewed  Outpatient Encounter Medications as of 06/12/2020  Medication Sig  . acetaminophen (TYLENOL) 650 MG CR tablet Take 1,300 mg by mouth 2 (two) times daily.  Marland Kitchen allopurinol (ZYLOPRIM) 100 MG tablet TAKE TWO TABLETS BY MOUTH DAILY  . amiodarone (PACERONE) 200 MG tablet Take 200 mg by mouth daily.  . Calcium Carbonate-Vitamin D 600-400 MG-UNIT tablet Take 1 tablet by mouth daily.   Marland Kitchen diltiazem (CARDIZEM CD) 240 MG 24 hr capsule TAKE ONE CAPSULE BY MOUTH DAILY  . hydrochlorothiazide (MICROZIDE) 12.5 MG capsule Take 1 capsule (12.5 mg total) by mouth daily.  Marland Kitchen icosapent Ethyl (VASCEPA) 1 g capsule Take 2 capsules (2 g total) by mouth 2 (two) times daily.  . Multiple Vitamins-Minerals (CENTRUM SILVER PO) Take 1 tablet by mouth daily.  Marland Kitchen omeprazole (PRILOSEC) 20 MG capsule Take 1 capsule (20 mg total) by mouth daily. Reported on 05/23/2015  . pravastatin (PRAVACHOL) 40 MG tablet Take 1 tablet (40 mg total) by mouth daily.  . Rivaroxaban (XARELTO)  15 MG TABS tablet TAKE ONE TABLET BY MOUTH DAILY WITH SUPPER.  Marland Kitchen tolterodine (DETROL LA) 4 MG 24 hr capsule TAKE ONE CAPSULE BY MOUTH DAILY  . calcium carbonate (TUMS - DOSED IN MG ELEMENTAL CALCIUM) 500 MG chewable tablet Chew 1 tablet by mouth daily as needed for indigestion or heartburn. Reported on 07/10/2015 (Patient not taking: Reported on 06/12/2020)  . colchicine 0.6 MG tablet Use as directed. Take two tablets by mouth at start of gout flare up. Then one hour later take one tablet by mouth.  (Patient not taking: No sig  reported)  . [DISCONTINUED] amiodarone (PACERONE) 200 MG tablet Take one tablet by mouth twice for 2 weeks, then decrease to once daily (Patient taking differently: Take 200 mg by mouth daily.)   No facility-administered encounter medications on file as of 06/12/2020.   She had NOT been taking Lovaza x 6 mos. NOT taking Vascepa prior to today's visit.  Allergies  Allergen Reactions  . Cephalexin Itching  . Ciprofloxacin Itching  . Diclofenac Nausea Only    Upset stomach  . Shellfish-Derived Products Nausea And Vomiting  . Tape Rash   ROS: no fever, chills, URI symptoms, headaches, chest pain, shortness of breath or URI symptoms. LBP per HPI. No urinary complaints, bleeding, rashes. No bowel issues. No nausea, vomiting. No numbness, tingling, weakness. No falls. Moods are good   PHYSICAL EXAM:  BP 130/60   Pulse 72   Ht 5' (1.524 m)   Wt 157 lb (71.2 kg)   BMI 30.66 kg/m   Pleasant, elderly female, in good spirits in no distress HEENT: conjunctiva and sclera are clear, EOMI, wearing mask Neck: No lymphadenopathy or mass Heart: regular rate and rhythm Lungs: clear bilaterally Back: no CVA tenderness. Some mild tenderness in upper lumbar spine, and in paraspinous muscles left lumbar region.  Mild tenderness at L SI joint.   Normal strength, sensation, DTR's. Negative SLR. Normal gait. Psych: normal mood, affect, hygiene and grooming  ASSESSMENT/PLAN:  Acute left-sided low back pain without sciatica - Rx written and given to patient for PT through [facility]. Heat, massage, stretches  Mixed hyperlipidemia - To start Vascepa (in place of Lovaza), 2 BID - Plan: icosapent Ethyl (VASCEPA) 1 g capsule  Travel advice encounter - not going to places high risk for traveler's diarrhea. Discussed bismuth. Consider Septra prn UTI. Avoid z-pak due to QT prolongation risk. Will d/w MD Dtr Hinton Dyer  Advanced care planning/counseling discussion - started conversation regarding care options  (MOST form, DNR).  She will discuss with physician dtr Hinton Dyer, and we will finish discussion/forms at [facility] in March   Had initial discussion of ACP--to d/w husband/daughter further. Forms needed for [facility]. Will complete forms at March visit.  I spent 50 minutes dedicated to the care of this patient, including pre-visit review of records, face to face time, post-visit ordering of testing and documentation. Also included 15 mins of ACP discussion

## 2020-06-18 ENCOUNTER — Other Ambulatory Visit: Payer: Self-pay | Admitting: Nurse Practitioner

## 2020-06-18 NOTE — Telephone Encounter (Signed)
Pt last saw Dr Tamala Julian 05/17/20, last labs 01/11/20 Creat 1.2, age 87, weight 71.2kg, CrCl 35.02, based on CrCl pt is on appropriate dosage of Xarelto 15mg  QD.  Will refill rx.

## 2020-06-19 DIAGNOSIS — M6281 Muscle weakness (generalized): Secondary | ICD-10-CM | POA: Diagnosis not present

## 2020-06-19 DIAGNOSIS — M5459 Other low back pain: Secondary | ICD-10-CM | POA: Diagnosis not present

## 2020-06-20 ENCOUNTER — Ambulatory Visit (INDEPENDENT_AMBULATORY_CARE_PROVIDER_SITE_OTHER): Payer: Medicare HMO | Admitting: Sports Medicine

## 2020-06-20 ENCOUNTER — Other Ambulatory Visit: Payer: Self-pay

## 2020-06-20 ENCOUNTER — Encounter: Payer: Self-pay | Admitting: Sports Medicine

## 2020-06-20 DIAGNOSIS — M79674 Pain in right toe(s): Secondary | ICD-10-CM

## 2020-06-20 DIAGNOSIS — B351 Tinea unguium: Secondary | ICD-10-CM

## 2020-06-20 DIAGNOSIS — Z7901 Long term (current) use of anticoagulants: Secondary | ICD-10-CM | POA: Diagnosis not present

## 2020-06-20 DIAGNOSIS — M6281 Muscle weakness (generalized): Secondary | ICD-10-CM | POA: Diagnosis not present

## 2020-06-20 DIAGNOSIS — M5459 Other low back pain: Secondary | ICD-10-CM | POA: Diagnosis not present

## 2020-06-20 DIAGNOSIS — L84 Corns and callosities: Secondary | ICD-10-CM

## 2020-06-20 DIAGNOSIS — M79675 Pain in left toe(s): Secondary | ICD-10-CM

## 2020-06-20 NOTE — Progress Notes (Signed)
Subjective: Connie [name] is a 87 y.o. female patient seen today in office with complaint of painful thickened and elongated toenails and callus; unable to trim. Patient denies changes with medical history; still on Xalerto for Afib like before.  No new issues noted.  Patient Active Problem List   Diagnosis Date Noted  . Low back pain 04/11/2020  . Mixed hyperlipidemia 04/11/2020  . Onychomycosis 04/11/2020  . Chronic anticoagulation 05/11/2016  . Atrial fibrillation with rapid ventricular response ([place]) 06/24/2015  . Gout 10/30/2013  . Gout of big toe 10/30/2013  . Osteoarthritis of multiple joints 10/30/2013  . Breast cancer, right breast ([place]) 11/25/2012  . Urge incontinence of urine 04/24/2011  . Arthritis 04/24/2011  . Essential hypertension, benign 10/23/2010  . Pure hypercholesterolemia 10/23/2010    Current Outpatient Medications on File Prior to Visit  Medication Sig Dispense Refill  . acetaminophen (TYLENOL) 650 MG CR tablet Take 1,300 mg by mouth 2 (two) times daily.    Marland Kitchen allopurinol (ZYLOPRIM) 100 MG tablet TAKE TWO TABLETS BY MOUTH DAILY 180 tablet 0  . amiodarone (PACERONE) 200 MG tablet Take 200 mg by mouth daily.    Marland Kitchen azithromycin (ZITHROMAX) 250 MG tablet     . calcium carbonate (TUMS - DOSED IN MG ELEMENTAL CALCIUM) 500 MG chewable tablet Chew 1 tablet by mouth daily as needed for indigestion or heartburn. Reported on 07/10/2015    . Calcium Carbonate-Vitamin D 600-400 MG-UNIT tablet Take 1 tablet by mouth daily.     . colchicine 0.6 MG tablet Use as directed. Take two tablets by mouth at start of gout flare up. Then one hour later take one tablet by mouth.    . diltiazem (CARDIZEM CD) 240 MG 24 hr capsule TAKE ONE CAPSULE BY MOUTH DAILY 90 capsule 3  . hydrochlorothiazide (MICROZIDE) 12.5 MG capsule Take 1 capsule (12.5 mg total) by mouth daily. 90 capsule 3  . icosapent Ethyl (VASCEPA) 1 g capsule Take 2 capsules (2 g total) by mouth 2 (two) times daily. 120  capsule 0  . Multiple Vitamins-Minerals (CENTRUM SILVER PO) Take 1 tablet by mouth daily.    Marland Kitchen omeprazole (PRILOSEC) 20 MG capsule Take 1 capsule (20 mg total) by mouth daily. Reported on 05/23/2015    . pravastatin (PRAVACHOL) 40 MG tablet Take 1 tablet (40 mg total) by mouth daily. 90 tablet 1  . tolterodine (DETROL LA) 4 MG 24 hr capsule TAKE ONE CAPSULE BY MOUTH DAILY 90 capsule 0  . trimethoprim-polymyxin b (POLYTRIM) ophthalmic solution     . XARELTO 15 MG TABS tablet TAKE ONE TABLET BY MOUTH DAILY WITH SUPPER 90 tablet 1   No current facility-administered medications on file prior to visit.    Allergies  Allergen Reactions  . Cephalexin Itching  . Ciprofloxacin Itching  . Diclofenac Nausea Only    Upset stomach  . Shellfish-Derived Products Nausea And Vomiting  . Tape Rash    Objective: Physical Exam  General: Well developed, nourished, no acute distress, awake, alert and oriented x 3  Vascular: Dorsalis pedis artery 1/4 bilateral, Posterior tibial artery 0/4 bilateral, skin temperature warm to warm proximal to distal bilateral lower extremities, trace edema at ankles bilateral, pedal hair present bilateral.  Neurological: Gross sensation present via light touch bilateral.   Dermatological: Skin is warm, dry, and supple bilateral, Nails 1-10 are tender, long, thick, and discolored with mild subungal debris, no webspace macerations present bilateral, no open lesions present bilateral, minimal reactive keratosis medial aspect of right  bunion greater than plantar left forefoot. No signs of infection bilateral.  Abrasion to anterior shin on the right that appears to be healing well without any issues.  Musculoskeletal: Asymptomatic bunion boney deformities noted bilateral. Muscular strength within normal limits without painon range of motion. No pain with calf compression bilateral.   Assessment and Plan:  Problem List Items Addressed This Visit   None   Visit Diagnoses     Pain due to onychomycosis of toenails of both feet    -  Primary   Relevant Medications   azithromycin (ZITHROMAX) 250 MG tablet   Callus of foot       Anticoagulant long-term use         -Examined patient.  -Re-Discussed continued care for painful mycotic toenails and callus -Mechanically debrided and reduced mycotic nails with sterile nail nipper and dremel nail file without incident and at no additional smooth callus plantar right foot using rotary bur without incident  -Continue with good supportive shoes for foot type like previous -Patient to return in 2.5 months for follow up evaluation or sooner if symptoms worsen.  Landis Martins, DPM

## 2020-07-10 DIAGNOSIS — H5203 Hypermetropia, bilateral: Secondary | ICD-10-CM | POA: Diagnosis not present

## 2020-07-13 NOTE — Progress Notes (Signed)
Cardiology Office Note:    Date:  07/18/2020   ID:  Connie [name], DOB 1927-12-14, MRN [id number]  PCP:  Rita Ohara, MD  Cardiologist:  Sinclair Grooms, MD   Referring MD: Rita Ohara, MD   Chief Complaint  Patient presents with  . Atrial Fibrillation    History of Present Illness:    Connie [name] is a 87 y.o. female with a hx of hypertension, hyperlipidemia,high burdenparoxysmal atrial fibrillation(monitor 04/2020)on anticoagulation, and initiation Amiodarone to suppress AF.   They had a marvelous trip with the entire family, cruising in the [place].  She does not recall having any episodes of atrial fibrillation or any significant palpitations.  No medication side effects.  Currently on amiodarone 200 mg/day.  Past Medical History:  Diagnosis Date  . Abnormal brain MRI 8/01   small hemorrhagic stroke and possible cavernous hemangioma (Dr. Erling Cruz)  . Adenomatous colon polyp 1/03  . Arthritis    OA spine, hands  . Breast cancer ([place]) 2001   R breast (T3N1, ER/PR+, HER-2 +) s/p mastectomy, chemo and chest wall irradiation (Dr. Truddie Coco)  . DDD (degenerative disc disease), lumbar   . GERD (gastroesophageal reflux disease)   . Hearing loss 2012   wears hearing aids  . Herpes zoster 2003  . Hyperlipidemia   . Hypertension   . Onychomycosis 2003, 2008   treated with Lamisil  . Osteopenia    (DEXA's done by Dr. Truddie Coco); prev took Fosamax.  No change in DEXA after off Fosamax x 2 years  . Ovarian cyst 12/2008   right  . Personal history of chemotherapy   . Personal history of radiation therapy   . Postmenopausal bleeding 2000   benign EMB (Dr. Raphael Gibney)  . Urge urinary incontinence     Past Surgical History:  Procedure Laterality Date  . CATARACT EXTRACTION  summer 2011   bilateral  . CHOLECYSTECTOMY, LAPAROSCOPIC  1/05  . COLONOSCOPY  12/17/08   normal (Dr. Wynetta Emery)  . COLONOSCOPY W/ POLYPECTOMY  05/23/01   adenomatous polyp  . MASTECTOMY Right  2001  . modified mastectomy and tram flap reconstruction  2001   R breast  . TONSILLECTOMY    . tram flap removal     problems with flap after radiation  . TUBAL LIGATION      Current Medications: Current Meds  Medication Sig  . acetaminophen (TYLENOL) 650 MG CR tablet Take 1,300 mg by mouth 2 (two) times daily.  Marland Kitchen allopurinol (ZYLOPRIM) 100 MG tablet TAKE TWO TABLETS BY MOUTH DAILY  . azithromycin (ZITHROMAX) 250 MG tablet   . calcium carbonate (TUMS - DOSED IN MG ELEMENTAL CALCIUM) 500 MG chewable tablet Chew 1 tablet by mouth daily as needed for indigestion or heartburn. Reported on 07/10/2015  . Calcium Carbonate-Vitamin D 600-400 MG-UNIT tablet Take 1 tablet by mouth daily.   . colchicine 0.6 MG tablet Use as directed. Take two tablets by mouth at start of gout flare up. Then one hour later take one tablet by mouth.  . diltiazem (CARDIZEM CD) 240 MG 24 hr capsule TAKE ONE CAPSULE BY MOUTH DAILY  . hydrochlorothiazide (MICROZIDE) 12.5 MG capsule Take 1 capsule (12.5 mg total) by mouth daily.  Marland Kitchen icosapent Ethyl (VASCEPA) 1 g capsule Take 2 capsules (2 g total) by mouth 2 (two) times daily.  . Multiple Vitamins-Minerals (CENTRUM SILVER PO) Take 1 tablet by mouth daily.  Marland Kitchen omeprazole (PRILOSEC) 20 MG capsule Take 1 capsule (20 mg total) by mouth daily. Reported  on 05/23/2015  . pravastatin (PRAVACHOL) 40 MG tablet Take 1 tablet (40 mg total) by mouth daily.  Marland Kitchen tolterodine (DETROL LA) 4 MG 24 hr capsule TAKE ONE CAPSULE BY MOUTH DAILY  . trimethoprim-polymyxin b (POLYTRIM) ophthalmic solution   . XARELTO 15 MG TABS tablet TAKE ONE TABLET BY MOUTH DAILY WITH SUPPER  . [DISCONTINUED] amiodarone (PACERONE) 200 MG tablet Take 200 mg by mouth daily.     Allergies:   Cephalexin, Ciprofloxacin, Diclofenac, Shellfish-derived products, and Tape   Social History   Socioeconomic History  . Marital status: Married    Spouse name: Not on file  . Number of children: 3  . Years of education: Not  on file  . Highest education level: Not on file  Occupational History  . Not on file  Tobacco Use  . Smoking status: Never Smoker  . Smokeless tobacco: Never Used  Vaping Use  . Vaping Use: Never used  Substance and Sexual Activity  . Alcohol use: No  . Drug use: No  . Sexual activity: Not Currently    Comment: due to poor libido and pain  Other Topics Concern  . Not on file  Social History Narrative   Moved 06/2018 to [place]. Lives with husband.   1 daughter at [employer]--hospitalist; daughter in [place] (works in Conservator, museum/gallery at [employer] took in Barrister's clerk), son in [place]. 8 grandchildren, 4 great-grandchildren      Updated 07/2019   Social Determinants of Health   Financial Resource Strain: Not on file  Food Insecurity: Not on file  Transportation Needs: Not on file  Physical Activity: Not on file  Stress: Not on file  Social Connections: Not on file     Family History: The patient's family history includes Diabetes in her brother; Heart disease in her brother, father, and mother; Hypertension in her father and mother. There is no history of Drug abuse, Cancer, or Breast cancer.  ROS:   Please see the history of present illness.    Safe cruise without any mishaps.  They are scheduled for a [employer] cruise that they are going to cancel.  All other systems reviewed and are negative.  EKGs/Labs/Other Studies Reviewed:    The following studies were reviewed today: No new imaging data  EKG:  EKG sinus rhythm,, first-degree AV block, normal otherwise.  Recent Labs: 12/25/2019: Hemoglobin 13.2; Platelets 186; TSH 2.260 01/11/2020: ALT 17; BUN 30; Creatinine, Ser 1.20; Potassium 4.5; Sodium 140  Recent Lipid Panel    Component Value Date/Time   CHOL 139 02/15/2020 0956   TRIG 139 02/15/2020 0956   HDL 39 (L) 02/15/2020 0956   CHOLHDL 3.6 02/15/2020 0956   CHOLHDL 3.6 03/18/2017 1343   VLDL 41 (H) 12/08/2016 0755   LDLCALC 75 02/15/2020 0956   LDLCALC 84  03/18/2017 1343    Physical Exam:    VS:  BP 132/60   Pulse 71   Ht 5' (1.524 m)   Wt 160 lb (72.6 kg)   SpO2 95%   BMI 31.25 kg/m     Wt Readings from Last 3 Encounters:  07/18/20 160 lb (72.6 kg)  06/12/20 157 lb (71.2 kg)  05/21/20 159 lb (72.1 kg)     GEN: Appears younger than stated age.. No acute distress HEENT: Normal NECK: No JVD. LYMPHATICS: No lymphadenopathy CARDIAC: No murmur. RRR positive S4 gallop, trace pedal edema. VASCULAR:  Normal Pulses. No bruits. RESPIRATORY:  Clear to auscultation without rales, wheezing or rhonchi  ABDOMEN: Soft, non-tender, non-distended, No pulsatile mass, MUSCULOSKELETAL: No deformity  SKIN: Warm and dry NEUROLOGIC:  Alert and oriented x 3 PSYCHIATRIC:  Normal affect   ASSESSMENT:    1. PAF (paroxysmal atrial fibrillation) ([place])   2. Chronic anticoagulation   3. Long term current use of amiodarone   4. Essential hypertension   5. Pure hypercholesterolemia    PLAN:    In order of problems listed above:  1. Controlled rhythm on amiodarone 200 mg/day.  Will decrease to 100 mg/day and hope that we can suppress atrial fibrillation with absolute lowest dose possible to minimize side effects.  EKG in 4 months on return.  Call earlier if she starts noticing irregular heartbeat. 2. Continue low-dose Xarelto. 3. Will need to do blood work on return (liver and thyroid function) if not done by her primary physician over the next month and a half.  Blood work is scheduled for the next office visit there. 4. Adequate blood pressure control for age.  Blood pressures at home tend to run between 135 to 662 mmHg systolic.  All diastolic pressures are less than 80.  Be mindful of salt in her diet.  It was difficult to control on the recent [place] cruise.  We will see how her blood pressure does over the next several weeks to months. 5. Continue Vascepa and Pravachol.   Medication Adjustments/Labs and Tests Ordered: Current medicines are  reviewed at length with the patient today.  Concerns regarding medicines are outlined above.  Orders Placed This Encounter  Procedures  . EKG 12-Lead   No orders of the defined types were placed in this encounter.   Patient Instructions  Medication Instructions:  Your physician recommends that you continue on your current medications as directed. Please refer to the Current Medication list given to you today.  *If you need a refill on your cardiac medications before your next appointment, please call your pharmacy*   Lab Work: None If you have labs (blood work) drawn today and your tests are completely normal, you will receive your results only by: Marland Kitchen MyChart Message (if you have MyChart) OR . A paper copy in the mail If you have any lab test that is abnormal or we need to change your treatment, we will call you to review the results.   Testing/Procedures: None   Follow-Up: At [facility], you and your health needs are our priority.  As part of our continuing mission to provide you with exceptional heart care, we have created designated Provider Care Teams.  These Care Teams include your primary Cardiologist (physician) and Advanced Practice Providers (APPs -  Physician Assistants and Nurse Practitioners) who all work together to provide you with the care you need, when you need it.  We recommend signing up for the patient portal called "MyChart".  Sign up information is provided on this After Visit Summary.  MyChart is used to connect with patients for Virtual Visits (Telemedicine).  Patients are able to view lab/test results, encounter notes, upcoming appointments, etc.  Non-urgent messages can be sent to your provider as well.   To learn more about what you can do with MyChart, go to [email or web address].    Your next appointment:   4 month(s)  The format for your next appointment:   In Person  Provider:   You may see Sinclair Grooms, MD or one of the following  Advanced Practice Providers on your designated Care Team:    Kathyrn Drown, NP  Other Instructions      Signed, Sinclair Grooms, MD  07/18/2020 10:25 AM    [place]

## 2020-07-16 DIAGNOSIS — M6281 Muscle weakness (generalized): Secondary | ICD-10-CM | POA: Diagnosis not present

## 2020-07-16 DIAGNOSIS — M5459 Other low back pain: Secondary | ICD-10-CM | POA: Diagnosis not present

## 2020-07-17 DIAGNOSIS — M5459 Other low back pain: Secondary | ICD-10-CM | POA: Diagnosis not present

## 2020-07-17 DIAGNOSIS — M6281 Muscle weakness (generalized): Secondary | ICD-10-CM | POA: Diagnosis not present

## 2020-07-18 ENCOUNTER — Encounter: Payer: Self-pay | Admitting: Interventional Cardiology

## 2020-07-18 ENCOUNTER — Other Ambulatory Visit: Payer: Self-pay

## 2020-07-18 ENCOUNTER — Ambulatory Visit: Payer: Medicare HMO | Admitting: Interventional Cardiology

## 2020-07-18 VITALS — BP 132/60 | HR 71 | Ht 60.0 in | Wt 160.0 lb

## 2020-07-18 DIAGNOSIS — E7800 Pure hypercholesterolemia, unspecified: Secondary | ICD-10-CM | POA: Diagnosis not present

## 2020-07-18 DIAGNOSIS — Z7901 Long term (current) use of anticoagulants: Secondary | ICD-10-CM | POA: Diagnosis not present

## 2020-07-18 DIAGNOSIS — I10 Essential (primary) hypertension: Secondary | ICD-10-CM

## 2020-07-18 DIAGNOSIS — Z79899 Other long term (current) drug therapy: Secondary | ICD-10-CM | POA: Diagnosis not present

## 2020-07-18 DIAGNOSIS — I480 Paroxysmal atrial fibrillation: Secondary | ICD-10-CM | POA: Diagnosis not present

## 2020-07-18 MED ORDER — AMIODARONE HCL 200 MG PO TABS
100.0000 mg | ORAL_TABLET | Freq: Every day | ORAL | 3 refills | Status: DC
Start: 2020-07-18 — End: 2021-08-11

## 2020-07-18 NOTE — Patient Instructions (Addendum)
Medication Instructions:  1) DECREASE Amiodarone to 100mg  once daily.  *If you need a refill on your cardiac medications before your next appointment, please call your pharmacy*   Lab Work: None If you have labs (blood work) drawn today and your tests are completely normal, you will receive your results only by: Marland Kitchen MyChart Message (if you have MyChart) OR . A paper copy in the mail If you have any lab test that is abnormal or we need to change your treatment, we will call you to review the results.   Testing/Procedures: None   Follow-Up: At [facility], you and your health needs are our priority.  As part of our continuing mission to provide you with exceptional heart care, we have created designated Provider Care Teams.  These Care Teams include your primary Cardiologist (physician) and Advanced Practice Providers (APPs -  Physician Assistants and Nurse Practitioners) who all work together to provide you with the care you need, when you need it.  We recommend signing up for the patient portal called "MyChart".  Sign up information is provided on this After Visit Summary.  MyChart is used to connect with patients for Virtual Visits (Telemedicine).  Patients are able to view lab/test results, encounter notes, upcoming appointments, etc.  Non-urgent messages can be sent to your provider as well.   To learn more about what you can do with MyChart, go to [email or web address].    Your next appointment:   4 month(s)  The format for your next appointment:   In Person  Provider:   You may see Sinclair Grooms, MD or one of the following Advanced Practice Providers on your designated Care Team:    Kathyrn Drown, NP    Other Instructions

## 2020-07-19 DIAGNOSIS — M5459 Other low back pain: Secondary | ICD-10-CM | POA: Diagnosis not present

## 2020-07-19 DIAGNOSIS — M6281 Muscle weakness (generalized): Secondary | ICD-10-CM | POA: Diagnosis not present

## 2020-07-23 DIAGNOSIS — M5459 Other low back pain: Secondary | ICD-10-CM | POA: Diagnosis not present

## 2020-07-23 DIAGNOSIS — M6281 Muscle weakness (generalized): Secondary | ICD-10-CM | POA: Diagnosis not present

## 2020-07-24 ENCOUNTER — Other Ambulatory Visit: Payer: Medicare HMO

## 2020-07-24 ENCOUNTER — Other Ambulatory Visit: Payer: Self-pay

## 2020-07-24 DIAGNOSIS — Z7901 Long term (current) use of anticoagulants: Secondary | ICD-10-CM

## 2020-07-24 DIAGNOSIS — I4891 Unspecified atrial fibrillation: Secondary | ICD-10-CM | POA: Diagnosis not present

## 2020-07-24 DIAGNOSIS — E782 Mixed hyperlipidemia: Secondary | ICD-10-CM

## 2020-07-24 DIAGNOSIS — M5459 Other low back pain: Secondary | ICD-10-CM | POA: Diagnosis not present

## 2020-07-24 DIAGNOSIS — I10 Essential (primary) hypertension: Secondary | ICD-10-CM | POA: Diagnosis not present

## 2020-07-24 DIAGNOSIS — R7303 Prediabetes: Secondary | ICD-10-CM | POA: Diagnosis not present

## 2020-07-24 DIAGNOSIS — M6281 Muscle weakness (generalized): Secondary | ICD-10-CM | POA: Diagnosis not present

## 2020-07-24 DIAGNOSIS — Z5181 Encounter for therapeutic drug level monitoring: Secondary | ICD-10-CM

## 2020-07-25 LAB — LIPID PANEL
Chol/HDL Ratio: 3.2 ratio (ref 0.0–4.4)
Cholesterol, Total: 157 mg/dL (ref 100–199)
HDL: 49 mg/dL (ref >39)
LDL Chol Calc (NIH): 79 mg/dL (ref 0–99)
Triglycerides: 172 mg/dL — ABNORMAL HIGH (ref 0–149)
VLDL Cholesterol Cal: 29 mg/dL (ref 5–40)

## 2020-07-25 LAB — COMPREHENSIVE METABOLIC PANEL
ALT: 15 IU/L (ref 0–32)
AST: 19 IU/L (ref 0–40)
Albumin/Globulin Ratio: 1.8 (ref 1.2–2.2)
Albumin: 4.1 g/dL (ref 3.5–4.6)
Alkaline Phosphatase: 93 IU/L (ref 44–121)
BUN/Creatinine Ratio: 9 — ABNORMAL LOW (ref 12–28)
BUN: 11 mg/dL (ref 10–36)
Bilirubin Total: 0.4 mg/dL (ref 0.0–1.2)
CO2: 27 mmol/L (ref 20–29)
Calcium: 10 mg/dL (ref 8.7–10.3)
Chloride: 100 mmol/L (ref 96–106)
Creatinine, Ser: 1.17 mg/dL — ABNORMAL HIGH (ref 0.57–1.00)
Globulin, Total: 2.3 g/dL (ref 1.5–4.5)
Glucose: 91 mg/dL (ref 65–99)
Potassium: 3.5 mmol/L (ref 3.5–5.2)
Sodium: 142 mmol/L (ref 134–144)
Total Protein: 6.4 g/dL (ref 6.0–8.5)
eGFR: 44 mL/min/{1.73_m2} — ABNORMAL LOW (ref >59)

## 2020-07-25 LAB — CBC WITH DIFFERENTIAL/PLATELET
Basophils Absolute: 0.1 10*3/uL (ref 0.0–0.2)
Basos: 1 %
EOS (ABSOLUTE): 0.2 10*3/uL (ref 0.0–0.4)
Eos: 3 %
Hematocrit: 40.6 % (ref 34.0–46.6)
Hemoglobin: 13.5 g/dL (ref 11.1–15.9)
Immature Grans (Abs): 0 10*3/uL (ref 0.0–0.1)
Immature Granulocytes: 0 %
Lymphocytes Absolute: 1.8 10*3/uL (ref 0.7–3.1)
Lymphs: 29 %
MCH: 31 pg (ref 26.6–33.0)
MCHC: 33.3 g/dL (ref 31.5–35.7)
MCV: 93 fL (ref 79–97)
Monocytes Absolute: 0.5 10*3/uL (ref 0.1–0.9)
Monocytes: 8 %
Neutrophils Absolute: 3.7 10*3/uL (ref 1.4–7.0)
Neutrophils: 59 %
Platelets: 174 10*3/uL (ref 150–450)
RBC: 4.36 x10E6/uL (ref 3.77–5.28)
RDW: 13.4 % (ref 11.7–15.4)
WBC: 6.3 10*3/uL (ref 3.4–10.8)

## 2020-07-25 LAB — HEMOGLOBIN A1C
Est. average glucose Bld gHb Est-mCnc: 126 mg/dL
Hgb A1c MFr Bld: 6 % — ABNORMAL HIGH (ref 4.8–5.6)

## 2020-07-26 DIAGNOSIS — M6281 Muscle weakness (generalized): Secondary | ICD-10-CM | POA: Diagnosis not present

## 2020-07-26 DIAGNOSIS — M5459 Other low back pain: Secondary | ICD-10-CM | POA: Diagnosis not present

## 2020-07-28 NOTE — Patient Instructions (Addendum)
HEALTH MAINTENANCE RECOMMENDATIONS:  It is recommended that you get at least 30 minutes of aerobic exercise at least 5 days/week (for weight loss, you may need as much as 60-90 minutes). This can be any activity that gets your heart rate up. This can be divided in 10-15 minute intervals if needed, but try and build up your endurance at least once a week.  Weight bearing exercise is also recommended twice weekly.  Eat a healthy diet with lots of vegetables, fruits and fiber.  "Colorful" foods have a lot of vitamins (ie green vegetables, tomatoes, red peppers, etc).  Limit sweet tea, regular sodas and alcoholic beverages, all of which has a lot of calories and sugar.  Up to 1 alcoholic drink daily may be beneficial for women (unless trying to lose weight, watch sugars).  Drink a lot of water.  Calcium recommendations are 1200-1500 mg daily (1500 mg for postmenopausal women or women without ovaries), and vitamin D 1000 IU daily.  This should be obtained from diet and/or supplements (vitamins), and calcium should not be taken all at once, but in divided doses.  Monthly self breast exams and yearly mammograms for women over the age of 102 is recommended.  Sunscreen of at least SPF 30 should be used on all sun-exposed parts of the skin when outside between the hours of 10 am and 4 pm (not just when at beach or pool, but even with exercise, golf, tennis, and yard work!)  Use a sunscreen that says "broad spectrum" so it covers both UVA and UVB rays, and make sure to reapply every 1-2 hours.  Remember to change the batteries in your smoke detectors when changing your clock times in the spring and fall. Carbon monoxide detectors are recommended for your home.  Use your seat belt every time you are in a car, and please drive safely and not be distracted with cell phones and texting while driving.   Connie [name] , Thank you for taking time to come for your Medicare Wellness Visit. I appreciate your ongoing  commitment to your health goals. Please review the following plan we discussed and let me know if I can assist you in the future.    This is a list of the screening recommended for you and due dates:  Health Maintenance  Topic Date Due  . COVID-19 Vaccine (3 - Moderna risk 4-dose series) 07/03/2019  . Tetanus Vaccine  12/05/2024  . Flu Shot  Completed  . DEXA scan (bone density measurement)  Completed  . Pneumonia vaccines  Completed  . HPV Vaccine  Aged Out   We didn't have your booster date until today (ignore due date above). Pay attention to the news and recommendations regarding further boosters.  Continue yearly high dose flu shots.  You are past due for mammogram, please call and schedule.  Ask your physical therapist about using an exercise bike at your gym. Try and get some daily exercise (outside of PT, on the days you don't go).  We discussed the MOST form (pink one) and DNR--we didn't make any changes today.  You had wanted to discuss more with Hinton Dyer. I believe we gave you blank forms in February so that you would have them to base your discussion off of.  Feel free to return for a visit sooner than your next one to have new ones completed, if being updated/changed.   High Triglycerides Eating Plan Triglycerides are a type of fat in the blood. High levels of triglycerides can  increase your risk of heart disease and stroke. If your triglyceride levels are high, choosing the right foods can help lower your triglycerides and keep your heart healthy. Work with your health care provider or a diet and nutrition specialist (dietitian) to develop an eating plan that is right for you. What are tips for following this plan? General guidelines  Lose weight, if you are overweight. For most people, losing 5-10 lbs (2-5 kg) helps lower triglyceride levels. A weight-loss plan may include. ? 30 minutes of exercise at least 5 days a week. ? Reducing the amount of calories, sugar, and fat  you eat.  Eat a wide variety of fresh fruits, vegetables, and whole grains. These foods are high in fiber.  Eat foods that contain healthy fats, such as fatty fish, nuts, seeds, and olive oil.  Avoid foods that are high in added sugar, added salt (sodium), saturated fat, and trans fat.  Avoid low-fiber, refined carbohydrates such as white bread, crackers, noodles, and white rice.  Avoid foods with partially hydrogenated oils (trans fats), such as fried foods or stick margarine.  Limit alcohol intake to no more than 1 drink a day for nonpregnant women and 2 drinks a day for men. One drink equals 12 oz of beer, 5 oz of wine, or 1 oz of hard liquor. Your health care provider may recommend that you drink less depending on your overall health.   Reading food labels  Check food labels for the amount of saturated fat. Choose foods with no or very little saturated fat.  Check food labels for the amount of trans fat. Choose foods with no trans fat.  Check food labels for the amount of cholesterol. Choose foods low in cholesterol. Ask your dietitian how much cholesterol you should have each day.  Check food labels for the amount of sodium. Choose foods with less than 140 milligrams (mg) per serving. Shopping  Buy dairy products labeled as nonfat (skim) or low-fat (1%).  Avoid buying processed or prepackaged foods. These are often high in added sugar, sodium, and fat. Cooking  Choose healthy fats when cooking, such as olive oil or canola oil.  Cook foods using lower fat methods, such as baking, broiling, boiling, or grilling.  Make your own sauces, dressings, and marinades when possible, instead of buying them. Store-bought sauces, dressings, and marinades are often high in sodium and sugar. Meal planning  Eat more home-cooked food and less restaurant, buffet, and fast food.  Eat fatty fish at least 2 times each week. Examples of fatty fish include salmon, trout, mackerel, tuna, and  herring.  If you eat whole eggs, do not eat more than 3 egg yolks per week. What foods are recommended? The items listed may not be a complete list. Talk with your dietitian about what dietary choices are best for you. Grains Whole wheat or whole grain breads, crackers, cereals, and pasta. Unsweetened oatmeal. Bulgur. Barley. Quinoa. Brown rice. Whole wheat flour tortillas. Vegetables Fresh or frozen vegetables. Low-sodium canned vegetables. Fruits All fresh, canned (in natural juice), or frozen fruits. Meats and other protein foods Skinless chicken or [place]. Ground chicken or [place]. Lean cuts of pork, trimmed of fat. Fish and seafood, especially salmon, trout, and herring. Egg whites. Dried beans, peas, or lentils. Unsalted nuts or seeds. Unsalted canned beans. Natural peanut or almond butter. Dairy Low-fat dairy products. Skim or low-fat (1%) milk. Reduced fat (2%) and low-sodium cheese. Low-fat ricotta cheese. Low-fat cottage cheese. Plain, low-fat yogurt. Fats and oils  Tub margarine without trans fats. Light or reduced-fat mayonnaise. Light or reduced-fat salad dressings. Avocado. Safflower, olive, sunflower, soybean, and canola oils. What foods are not recommended? The items listed may not be a complete list. Talk with your dietitian about what dietary choices are best for you. Grains White bread. White (regular) pasta. White rice. Cornbread. Bagels. Pastries. Crackers that contain trans fat. Vegetables Creamed or fried vegetables. Vegetables in a cheese sauce. Fruits Sweetened dried fruit. Canned fruit in syrup. Fruit juice. Meats and other protein foods Fatty cuts of meat. Ribs. Chicken wings. Berniece Salines. Sausage. Bologna. Salami. Chitterlings. Fatback. Hot dogs. Bratwurst. Packaged lunch meats. Dairy Whole or reduced-fat (2%) milk. Half-and-half. Cream cheese. Full-fat or sweetened yogurt. Full-fat cheese. Nondairy creamers. Whipped toppings. Processed cheese or cheese spreads.  Cheese curds. Beverages Alcohol. Sweetened drinks, such as soda, lemonade, fruit drinks, or punches. Fats and oils Butter. Stick margarine. Lard. Shortening. Ghee. Bacon fat. Tropical oils, such as coconut, palm kernel, or palm oils. Sweets and desserts Corn syrup. Sugars. Honey. Molasses. Candy. Jam and jelly. Syrup. Sweetened cereals. Cookies. Pies. Cakes. Donuts. Muffins. Ice cream. Condiments Store-bought sauces, dressings, and marinades that are high in sugar, such as ketchup and barbecue sauce. Summary  High levels of triglycerides can increase the risk of heart disease and stroke. Choosing the right foods can help lower your triglycerides.  Eat plenty of fresh fruits, vegetables, and whole grains. Choose low-fat dairy and lean meats. Eat fatty fish at least twice a week.  Avoid processed and prepackaged foods with added sugar, sodium, saturated fat, and trans fat.  If you need suggestions or have questions about what types of food are good for you, talk with your health care provider or a dietitian. This information is not intended to replace advice given to you by your health care provider. Make sure you discuss any questions you have with your health care provider. Document Revised: 08/23/2019 Document Reviewed: 08/23/2019 Elsevier Patient Education  2021 [employer].

## 2020-07-28 NOTE — Progress Notes (Signed)
Chief Complaint  Patient presents with  . Medicare Wellness    Nonfasting AWV. She cannot tolerate fish oil. Still having issues with constipation.   Connie [name] is a 87 y.o. female who presents for annual physical exam, Medicare wellness visit and follow-up on chronic medical conditions.  She had labs checked prior to visit, see results below. She has the following concerns:   Seen last month with back pain.  She is getting physical therapy and is improving.  She has some stiffness, but is much better.  Recurrent afib with RVR.  She saw Dr. Tamala Julian recently, and amiodarone dose was decreased to 149m (1/2 tablet).  She continues on verapamil and Xarelto. BP was a little elevated at that visit, but she [place]harder to control salt intake). BP's have been running 120's-150's/60's per pt recall (forgot to bring list). She denies headaches, dizziness, chest pain, muscle cramps. She denies any bleeding. She uses Tylenol Arthritis 2 BID, which controls her pain (feet, hands, back).  Gout: She hasn't had any recurrent gout of her great toe, on her current dose of allopurinol (2023m. Uric acid increased from 4.5 to 6.1 whenHCTZ was initially started. She has not had any flares. Recheck was at goal, uric acid <6. Lab Results  Component Value Date   LABURIC 5.7 01/11/2020   Hyperlipidemia: Compliant with 4016mf pravastatin without side effects.She hasn't tolerated fish oil in the past (due to belching).She was put on Lovaza, but due to formulary change, this was recently changed to VasShorelinetarted in February).   She isn't tolerating Vascepa due to acid reflux.  She only took it for a week, and wasn't able to tolerate even just 1 capsule daily.  It also costs $280/month.  She doesn't recall having tolerability issues with Lovaza, but had stopped it a while back--thinks that she doesn't tolerate any of these, and doesn't want to take them  anymore. She tries to follow a lowfat, low cholesterol diet, though was recently on a cruise vacation. She only gained a pound on the cruise, tried to limit her desserts. Normal diet is occasional fried fish and desserts 2x/week.   Urge urinary incontinence--TakingDetrol LA4mg43most of the time it works well. Occasionally has a night where she is up every 2 hours at night (no frequency during the day)--occurs about once a month now. She usually gets up just 2-3x/night.She previously reported somedry mouth, which improved when using humidifer. They are still using the humidifier. Denies any dysuria, odor or hematuria. She wearsPoise pads daily, due to the urgencyand incontinence if she is notclose enough to the bathroom..  GMarland KitchenRD--She has reflux about once or twice a month, and will take either Prilosec or Tums, or both. The vascepa made her reflux worse, so she stopped taking it. Food served isn't spicy, and eats at 6pm. Denies dysphagia.   Immunization History  Administered Date(s) Administered  . Fluad Quad(high Dose 65+) 01/04/2019, 01/11/2020  . Hepatitis A 11/06/1995, 08/25/1996  . IPV 11/06/1995  . Influenza Split 03/12/2009, 02/02/2011, 02/02/2012, 01/19/2013  . Influenza, High Dose Seasonal PF 02/24/2016, 02/01/2017, 02/17/2018  . Influenza-Unspecified 01/16/2014, 02/02/2015  . Meningococcal Polysaccharide 11/06/1995  . Moderna Sars-Covid-2 Vaccination 05/08/2019, 06/05/2019, 03/12/2020  . Pneumococcal Conjugate-13 05/01/2013  . Pneumococcal Polysaccharide-23 11/06/1995, 06/04/2005, 06/15/2016  . Tdap 11/06/1995, 06/04/2005, 06/16/2013, 12/06/2014  . Typhoid Live 11/15/1995, 04/29/2010, 06/10/2015  . Yellow Fever 04/04/2012  . Zoster 06/04/2005  . Zoster Recombinat (Shingrix) 08/20/2017, 11/18/2017   Last  Pap smear: 6/09  Last mammogram:03/2019 Last colonoscopy: 12/2008  Last DEXA:09/2015 T-1.1 L fem neck Dentist: regular, 1-2 times/year(every 9  months) Ophtho: yearly  Exercise:getting physical therapy 3x/week (using weights with lower body only). Walks 1/2 mile daily to meals. Walking less, as she used to walk with her husband, and he has been having some breathing issues.  Other doctors caring for patient include: Ophtho: Dr. [place] Dentist: Dr.Pope Oncologist: Dr. Humphrey Rolls (she has been released from her care) Derm:Dr. Derrel Nip GI: Dr. Wynetta Emery Podiatrist: Dr. Cannon Kettle Cardiologists: Dr. Tamala Julian, Dr. Rayann Heman   Depression screen: Negative Fall screen: None Functional status screen: uses hearing aids;trouble with remembering names, chronic/unchanged; occasional knee pain with stairs only. Some urge incontinence. Husband manages finances. Mini-Cog screen normal. See full questionnaires in epic.  End of Life Discussion: Patient hasa living will and medical power of attorney. We started discussion of advanced care planning at her February visit. Plan was to discuss with her daughter, given blank DNR and MOST forms.  She hasn't had a chance to do this yet, but plans to.   PMH, PSH, SH and FH were reviewed and updated  Outpatient Encounter Medications as of 07/29/2020  Medication Sig  . acetaminophen (TYLENOL) 650 MG CR tablet Take 1,300 mg by mouth 2 (two) times daily.  Marland Kitchen allopurinol (ZYLOPRIM) 100 MG tablet TAKE TWO TABLETS BY MOUTH DAILY  . amiodarone (PACERONE) 200 MG tablet Take 0.5 tablets (100 mg total) by mouth daily.  . calcium carbonate (TUMS - DOSED IN MG ELEMENTAL CALCIUM) 500 MG chewable tablet Chew 1 tablet by mouth daily as needed for indigestion or heartburn. Reported on 07/10/2015  . Calcium Carbonate-Vitamin D 600-400 MG-UNIT tablet Take 1 tablet by mouth daily.   Marland Kitchen diltiazem (CARDIZEM CD) 240 MG 24 hr capsule TAKE ONE CAPSULE BY MOUTH DAILY  . hydrochlorothiazide (MICROZIDE) 12.5 MG capsule Take 1 capsule (12.5 mg total) by mouth daily.  . Multiple Vitamins-Minerals (CENTRUM SILVER PO) Take 1 tablet by mouth  daily.  Marland Kitchen omeprazole (PRILOSEC) 20 MG capsule Take 1 capsule (20 mg total) by mouth daily. Reported on 05/23/2015  . pravastatin (PRAVACHOL) 40 MG tablet Take 1 tablet (40 mg total) by mouth daily.  Marland Kitchen tolterodine (DETROL LA) 4 MG 24 hr capsule TAKE ONE CAPSULE BY MOUTH DAILY  . XARELTO 15 MG TABS tablet TAKE ONE TABLET BY MOUTH DAILY WITH SUPPER  . colchicine 0.6 MG tablet Use as directed. Take two tablets by mouth at start of gout flare up. Then one hour later take one tablet by mouth. (Patient not taking: Reported on 07/29/2020)  . icosapent Ethyl (VASCEPA) 1 g capsule Take 2 capsules (2 g total) by mouth 2 (two) times daily. (Patient not taking: Reported on 07/29/2020)  . [DISCONTINUED] azithromycin (ZITHROMAX) 250 MG tablet   . [DISCONTINUED] trimethoprim-polymyxin b (POLYTRIM) ophthalmic solution    No facility-administered encounter medications on file as of 07/29/2020.   Allergies  Allergen Reactions  . Cephalexin Itching  . Ciprofloxacin Itching  . Diclofenac Nausea Only    Upset stomach  . Shellfish-Derived Products Nausea And Vomiting  . Tape Rash     ROS: The patient denies anorexia, fever, weight changes, headaches, vision changes, ear pain, sore throat, breast concerns, chest pain, dizziness, syncope, dyspnea on exertion, cough, swelling, nausea, vomiting, diarrhea, melena, hematochezia, indigestion/heartburn (infrequent, per HPI), hematuria, dysuria, vaginal bleeding, discharge, odor or itch, genital lesions, weakness, tremor, suspicious skin lesions, depression, anxiety, abnormal bleeding/bruising, or enlarged lymph nodes.  +urge urinary incontinence (see  HPI). +joint pains in hands/fingers/feet--treated with tylenol, per HPI; slight knee pain with stairs, intermittent +hearing loss--has hearing aids, working well LBP is improving with PT. +constipation--has been alternating miralax and metamucil, and bowels have improved (plans to use and not continue  metamucil).   PHYSICAL EXAM:  BP 134/62   Pulse 76   Ht 5' (1.524 m)   Wt 161 lb (73 kg)   BMI 31.44 kg/m    Wt Readings from Last 3 Encounters:  07/29/20 161 lb (73 kg)  07/18/20 160 lb (72.6 kg)  06/12/20 157 lb (71.2 kg)     General Appearance:  Alert, cooperative, no distress, appears stated age   Head:  Normocephalic, without obvious abnormality, atraumatic   Eyes:  PERRL, conjunctiva/corneas clear, EOM's intact, fundi benign   Ears:  Normal external ears. Normal TM's and EACs. Nonobstructive cerumen noted on the R  Nose:  Not examined, wearing mask due to COVID-19 pandemic  Throat:  Not examined, wearing mask due to COVID-19 pandemic   Neck:  Supple, no lymphadenopathy; thyroid: no enlargement/ tenderness/nodules; no carotid bruit or JVD   Back:  Spine nontender, no curvature, ROM normal, no CVA tenderness. Small area of focal tenderness at L lumbar paraspinous muscles   Lungs:  Clear to auscultation bilaterally without wheezes, rales or ronchi; respirations unlabored   Chest Wall:  No tenderness or deformity   Heart:  Regular rate and rhythm, S1 and S2 normal, no murmur, rub or gallop.  Breast Exam:  R breast absent, well healed surgical scars, nontender. Egg-sized soft tissue, cystic-swelling at proximal part of right pectoralis tendon (below proximal humerus), soft, mobile, nontender (approx 3 x 4cm)--this is just proximal to where her compression sleeve starts.  This is unchanged. L breast normal-- no masses or nipple discharge. nipple is mildly inverted, chronic. No axillary lymphadenopathy   Abdomen:  Soft, non-tender, nondistended, normoactive bowel sounds, no masses, no hepatosplenomegaly.   Genitalia:  Exam declined by patient.  Rectal:  Exam declined by patient  Extremities:  No clubbing, cyanosis or edema. R bunion with deviation of R great toe. Hyperpigmentation and scarring from prior injury at R shin  Pulses:   2+ and symmetric all extremities   Skin:  Skin color, texture, turgor normal.  Many angiomason trunk  Lymph nodes:  Cervical, supraclavicular, and axillary nodes normal   Neurologic:  CNII-XII intact, normal strength, sensation and gait; reflexes 2+ and symmetric throughout    Psych: Normal mood, affect, hygiene and grooming      Chemistry      Component Value Date/Time   NA 142 07/24/2020 0903   NA 141 11/11/2012 1020   K 3.5 07/24/2020 0903   K 4.1 11/11/2012 1020   CL 100 07/24/2020 0903   CO2 27 07/24/2020 0903   CO2 26 11/11/2012 1020   BUN 11 07/24/2020 0903   BUN 31.6 (H) 11/11/2012 1020   CREATININE 1.17 (H) 07/24/2020 0903   CREATININE 1.16 (H) 12/08/2016 0755   CREATININE 1.5 (H) 11/11/2012 1020      Component Value Date/Time   CALCIUM 10.0 07/24/2020 0903   CALCIUM 10.1 11/11/2012 1020   ALKPHOS 93 07/24/2020 0903   ALKPHOS 78 11/11/2012 1020   AST 19 07/24/2020 0903   AST 19 11/11/2012 1020   ALT 15 07/24/2020 0903   ALT 17 11/11/2012 1020   BILITOT 0.4 07/24/2020 0903   BILITOT 0.43 11/11/2012 1020     Fasting gluc 91  Lab Results  Component Value Date  HGBA1C 6.0 (H) 07/24/2020     Lab Results  Component Value Date   WBC 6.3 07/24/2020   HGB 13.5 07/24/2020   HCT 40.6 07/24/2020   MCV 93 07/24/2020   PLT 174 07/24/2020   Lab Results  Component Value Date   CHOL 157 07/24/2020   HDL 49 07/24/2020   LDLCALC 79 07/24/2020   TRIG 172 (H) 07/24/2020   CHOLHDL 3.2 07/24/2020    ASSESSMENT/PLAN:  Annual physical exam  Medicare annual wellness visit, subsequent  Prediabetes - Reviewed proper diet, exercise; wt loss encouraged - Plan: Hemoglobin A1c, Comprehensive metabolic panel  Paroxysmal atrial fibrillation with rapid ventricular response (HCC) - Dr. Tamala Julian recently decreased amiodarone dose.  TSH added on to recent labs. NSR today - Plan: TSH  Urge incontinence of urine - cont current meds  Gout of  big toe - uric acid at goal and no flares. On allopurinol (also on low dose HCTZ, not changing regimen) - Plan: Uric acid  Advanced care planning/counseling discussion - Will keep current MOST for now. She will discuss with her MD daughter and return if any changes desired.  Essential hypertension, benign - Plan: Comprehensive metabolic panel  Personal history of breast cancer - past due for mammogram, reminded to schedule  Mixed hyperlipidemia - TG above goal on pravastatin. Intolerant of Vascepa.  Lowfat diet reviewed - Plan: pravastatin (PRAVACHOL) 40 MG tablet, Lipid panel  Stage 3b chronic kidney disease ([place]) - per recent labs. Avoid NSAIDs and cont to monitor - Plan: Comprehensive metabolic panel  Anticoagulant long-term use - Plan: CBC with Differential/Platelet  Medication monitoring encounter - Plan: Uric acid, TSH, CBC with Differential/Platelet, Lipid panel, Comprehensive metabolic panel  Left-sided low back pain without sciatica, unspecified chronicity - improving with PT. Continue    Discussed monthly self breast exams and yearly mammograms (past due); at least 30 minutes of aerobic activity at least 5 days/week; weight-bearing exercise at least 2x/wk; proper sunscreen use reviewed; healthy diet, including goals of calcium and vitamin D intake and alcohol recommendations (less than or equal to 1 drink/day) reviewed; regular seatbelt use; changing batteries in smoke detectors. Immunization recommendations discussed--UTD, continue yearly high dose flu shots. Colonoscopy recommendations reviewed--last done 12/2008. F/u not needed due to age, unless problems. Pap smear not indicated due to age and low risk.   MOST form was reviewed, signed.  Discussed in detail, and decided to leave it as is. She will will discuss with daughter Connie [name] and let us know if changes desired. Full Code, full care.   F/u 6 month med check with fasting labs prior--CBC, c-met, lipid, uric acid, A1c,  TSH   Medicare Attestation I have personally reviewed: The patient's medical and social history Their use of alcohol, tobacco or illicit drugs Their current medications and supplements The patient's functional ability including ADLs,fall risks, home safety risks, cognitive, and hearing and visual impairment Diet and physical activities Evidence for depression or mood disorders  The patient's weight, height, BMI, and visual acuity have been recorded in the chart.  I have made referrals, counseling, and provided education to the patient based on review of the above and I have provided the patient with a written personalized care plan for preventive services.

## 2020-07-29 ENCOUNTER — Encounter: Payer: Self-pay | Admitting: Family Medicine

## 2020-07-29 ENCOUNTER — Other Ambulatory Visit: Payer: Self-pay

## 2020-07-29 ENCOUNTER — Ambulatory Visit (INDEPENDENT_AMBULATORY_CARE_PROVIDER_SITE_OTHER): Payer: Medicare HMO | Admitting: Family Medicine

## 2020-07-29 VITALS — BP 134/62 | HR 76 | Ht 60.0 in | Wt 161.0 lb

## 2020-07-29 DIAGNOSIS — R7303 Prediabetes: Secondary | ICD-10-CM | POA: Diagnosis not present

## 2020-07-29 DIAGNOSIS — I10 Essential (primary) hypertension: Secondary | ICD-10-CM | POA: Diagnosis not present

## 2020-07-29 DIAGNOSIS — Z7189 Other specified counseling: Secondary | ICD-10-CM

## 2020-07-29 DIAGNOSIS — Z5181 Encounter for therapeutic drug level monitoring: Secondary | ICD-10-CM

## 2020-07-29 DIAGNOSIS — N1832 Chronic kidney disease, stage 3b: Secondary | ICD-10-CM | POA: Diagnosis not present

## 2020-07-29 DIAGNOSIS — Z7901 Long term (current) use of anticoagulants: Secondary | ICD-10-CM

## 2020-07-29 DIAGNOSIS — I480 Paroxysmal atrial fibrillation: Secondary | ICD-10-CM | POA: Diagnosis not present

## 2020-07-29 DIAGNOSIS — Z853 Personal history of malignant neoplasm of breast: Secondary | ICD-10-CM

## 2020-07-29 DIAGNOSIS — E782 Mixed hyperlipidemia: Secondary | ICD-10-CM

## 2020-07-29 DIAGNOSIS — N3941 Urge incontinence: Secondary | ICD-10-CM | POA: Diagnosis not present

## 2020-07-29 DIAGNOSIS — M109 Gout, unspecified: Secondary | ICD-10-CM | POA: Diagnosis not present

## 2020-07-29 DIAGNOSIS — Z0000 Encounter for general adult medical examination without abnormal findings: Secondary | ICD-10-CM | POA: Diagnosis not present

## 2020-07-29 DIAGNOSIS — M545 Low back pain, unspecified: Secondary | ICD-10-CM

## 2020-07-29 MED ORDER — PRAVASTATIN SODIUM 40 MG PO TABS: 40.0000 mg | ORAL_TABLET | Freq: Every day | ORAL | 1 refills | Status: DC

## 2020-07-30 DIAGNOSIS — M6281 Muscle weakness (generalized): Secondary | ICD-10-CM | POA: Diagnosis not present

## 2020-07-30 DIAGNOSIS — M5459 Other low back pain: Secondary | ICD-10-CM | POA: Diagnosis not present

## 2020-07-30 LAB — SPECIMEN STATUS REPORT

## 2020-07-30 LAB — TSH: TSH: 3.74 u[IU]/mL (ref 0.450–4.500)

## 2020-07-31 DIAGNOSIS — M6281 Muscle weakness (generalized): Secondary | ICD-10-CM | POA: Diagnosis not present

## 2020-07-31 DIAGNOSIS — M5459 Other low back pain: Secondary | ICD-10-CM | POA: Diagnosis not present

## 2020-08-02 DIAGNOSIS — M5459 Other low back pain: Secondary | ICD-10-CM | POA: Diagnosis not present

## 2020-08-02 DIAGNOSIS — M6281 Muscle weakness (generalized): Secondary | ICD-10-CM | POA: Diagnosis not present

## 2020-08-06 DIAGNOSIS — M5459 Other low back pain: Secondary | ICD-10-CM | POA: Diagnosis not present

## 2020-08-06 DIAGNOSIS — M6281 Muscle weakness (generalized): Secondary | ICD-10-CM | POA: Diagnosis not present

## 2020-08-07 DIAGNOSIS — M6281 Muscle weakness (generalized): Secondary | ICD-10-CM | POA: Diagnosis not present

## 2020-08-07 DIAGNOSIS — M5459 Other low back pain: Secondary | ICD-10-CM | POA: Diagnosis not present

## 2020-08-09 DIAGNOSIS — M5459 Other low back pain: Secondary | ICD-10-CM | POA: Diagnosis not present

## 2020-08-09 DIAGNOSIS — M6281 Muscle weakness (generalized): Secondary | ICD-10-CM | POA: Diagnosis not present

## 2020-08-13 DIAGNOSIS — M5459 Other low back pain: Secondary | ICD-10-CM | POA: Diagnosis not present

## 2020-08-13 DIAGNOSIS — M6281 Muscle weakness (generalized): Secondary | ICD-10-CM | POA: Diagnosis not present

## 2020-08-14 DIAGNOSIS — M6281 Muscle weakness (generalized): Secondary | ICD-10-CM | POA: Diagnosis not present

## 2020-08-14 DIAGNOSIS — M5459 Other low back pain: Secondary | ICD-10-CM | POA: Diagnosis not present

## 2020-08-16 DIAGNOSIS — M5459 Other low back pain: Secondary | ICD-10-CM | POA: Diagnosis not present

## 2020-08-16 DIAGNOSIS — M6281 Muscle weakness (generalized): Secondary | ICD-10-CM | POA: Diagnosis not present

## 2020-08-20 DIAGNOSIS — M6281 Muscle weakness (generalized): Secondary | ICD-10-CM | POA: Diagnosis not present

## 2020-08-20 DIAGNOSIS — M5459 Other low back pain: Secondary | ICD-10-CM | POA: Diagnosis not present

## 2020-08-21 DIAGNOSIS — M6281 Muscle weakness (generalized): Secondary | ICD-10-CM | POA: Diagnosis not present

## 2020-08-21 DIAGNOSIS — M5459 Other low back pain: Secondary | ICD-10-CM | POA: Diagnosis not present

## 2020-08-23 DIAGNOSIS — M6281 Muscle weakness (generalized): Secondary | ICD-10-CM | POA: Diagnosis not present

## 2020-08-23 DIAGNOSIS — M5459 Other low back pain: Secondary | ICD-10-CM | POA: Diagnosis not present

## 2020-08-27 DIAGNOSIS — M5459 Other low back pain: Secondary | ICD-10-CM | POA: Diagnosis not present

## 2020-08-27 DIAGNOSIS — M6281 Muscle weakness (generalized): Secondary | ICD-10-CM | POA: Diagnosis not present

## 2020-08-28 ENCOUNTER — Other Ambulatory Visit: Payer: Self-pay | Admitting: Interventional Cardiology

## 2020-08-29 ENCOUNTER — Other Ambulatory Visit: Payer: Self-pay

## 2020-08-29 ENCOUNTER — Encounter: Payer: Self-pay | Admitting: Sports Medicine

## 2020-08-29 ENCOUNTER — Ambulatory Visit: Payer: Medicare HMO | Admitting: Sports Medicine

## 2020-08-29 DIAGNOSIS — B351 Tinea unguium: Secondary | ICD-10-CM | POA: Diagnosis not present

## 2020-08-29 DIAGNOSIS — M79674 Pain in right toe(s): Secondary | ICD-10-CM | POA: Diagnosis not present

## 2020-08-29 DIAGNOSIS — M79675 Pain in left toe(s): Secondary | ICD-10-CM

## 2020-08-29 DIAGNOSIS — L84 Corns and callosities: Secondary | ICD-10-CM

## 2020-08-29 DIAGNOSIS — Z7901 Long term (current) use of anticoagulants: Secondary | ICD-10-CM | POA: Diagnosis not present

## 2020-08-29 NOTE — Progress Notes (Signed)
Subjective: Kaylenn Civil is a 87 y.o. female patient seen today in office with complaint of painful thickened and elongated toenails and callus; unable to trim. Patient denies changes with medical history; still on Xalerto for Afib like before with no changes.  No new issues noted.  Patient Active Problem List   Diagnosis Date Noted  . Low back pain 04/11/2020  . Mixed hyperlipidemia 04/11/2020  . Onychomycosis 04/11/2020  . Chronic anticoagulation 05/11/2016  . Atrial fibrillation with rapid ventricular response ([place]) 06/24/2015  . Gout 10/30/2013  . Gout of big toe 10/30/2013  . Osteoarthritis of multiple joints 10/30/2013  . Breast cancer, right breast ([place]) 11/25/2012  . Urge incontinence of urine 04/24/2011  . Arthritis 04/24/2011  . Essential hypertension, benign 10/23/2010  . Pure hypercholesterolemia 10/23/2010    Current Outpatient Medications on File Prior to Visit  Medication Sig Dispense Refill  . acetaminophen (TYLENOL) 650 MG CR tablet Take 1,300 mg by mouth 2 (two) times daily.    Marland Kitchen allopurinol (ZYLOPRIM) 100 MG tablet TAKE TWO TABLETS BY MOUTH DAILY 180 tablet 0  . amiodarone (PACERONE) 200 MG tablet Take 0.5 tablets (100 mg total) by mouth daily. 45 tablet 3  . calcium carbonate (TUMS - DOSED IN MG ELEMENTAL CALCIUM) 500 MG chewable tablet Chew 1 tablet by mouth daily as needed for indigestion or heartburn. Reported on 07/10/2015    . Calcium Carbonate-Vitamin D 600-400 MG-UNIT tablet Take 1 tablet by mouth daily.     . colchicine 0.6 MG tablet Use as directed. Take two tablets by mouth at start of gout flare up. Then one hour later take one tablet by mouth.    . diltiazem (CARDIZEM CD) 240 MG 24 hr capsule TAKE ONE CAPSULE BY MOUTH DAILY 90 capsule 3  . hydrochlorothiazide (MICROZIDE) 12.5 MG capsule TAKE ONE CAPSULE BY MOUTH DAILY 90 capsule 3  . Multiple Vitamins-Minerals (CENTRUM SILVER PO) Take 1 tablet by mouth daily.    Marland Kitchen omeprazole (PRILOSEC) 20 MG  capsule Take 1 capsule (20 mg total) by mouth daily. Reported on 05/23/2015    . pravastatin (PRAVACHOL) 40 MG tablet Take 1 tablet (40 mg total) by mouth daily. 90 tablet 1  . tolterodine (DETROL LA) 4 MG 24 hr capsule TAKE ONE CAPSULE BY MOUTH DAILY 90 capsule 0  . XARELTO 15 MG TABS tablet TAKE ONE TABLET BY MOUTH DAILY WITH SUPPER 90 tablet 1   No current facility-administered medications on file prior to visit.    Allergies  Allergen Reactions  . Cephalexin Itching  . Ciprofloxacin Itching  . Diclofenac Nausea Only    Upset stomach  . Shellfish-Derived Products Nausea And Vomiting  . Tape Rash    Objective: Physical Exam  General: Well developed, nourished, no acute distress, awake, alert and oriented x 3  Vascular: Dorsalis pedis artery 1/4 bilateral, Posterior tibial artery 0/4 bilateral, skin temperature warm to warm proximal to distal bilateral lower extremities, trace edema at ankles bilateral, pedal hair present bilateral.  Neurological: Gross sensation present via light touch bilateral.   Dermatological: Skin is warm, dry, and supple bilateral, Nails 1-10 are tender, long, thick, and discolored with mild subungal debris, no webspace macerations present bilateral, no open lesions present bilateral, minimal reactive keratosis medial aspect of right bunion greater than plantar left forefoot. No signs of infection bilateral.  Abrasion to anterior shin on the right that appears to be healing well without any issues.  Musculoskeletal: Asymptomatic bunion boney deformities noted bilateral. Muscular strength within  normal limits without painon range of motion. No pain with calf compression bilateral.   Assessment and Plan:  Problem List Items Addressed This Visit   None   Visit Diagnoses    Pain due to onychomycosis of toenails of both feet    -  Primary   Callus of foot       Anticoagulant long-term use         -Examined patient.  -Re-Discussed continued care for  painful mycotic toenails and callus -Mechanically debrided and reduced mycotic nails with sterile nail nipper and dremel nail file without incident and at no additional smooth callus plantar right foot using rotary bur without incident  -Continue with good supportive shoes for foot type like previous -Patient to return in 2.5 months for follow up evaluation or sooner if symptoms worsen.  Landis Martins, DPM

## 2020-09-20 ENCOUNTER — Other Ambulatory Visit: Payer: Self-pay | Admitting: Family Medicine

## 2020-09-23 ENCOUNTER — Other Ambulatory Visit: Payer: Self-pay | Admitting: Family Medicine

## 2020-09-25 ENCOUNTER — Telehealth: Payer: Self-pay | Admitting: *Deleted

## 2020-09-25 NOTE — Telephone Encounter (Signed)
Patient decided to keep #90 at [facility] instead of the #100 to [facility]. Also advised to get covid booster.

## 2020-09-25 NOTE — Telephone Encounter (Signed)
Patient called and said [employer] mail order will give her #100 of tolterodine for the same price as #90 as the local [facility]. She called and canceled the one you sent to [facility] yesterday and is asking you to send for the #100 instead. Also she is wanting to get booster #2 (covid) but has some concerns. After each Moderna shot she felt terrible and ran temp x 2 days. Do you think she should go ahead and get? Do you think maybe she should switch to the [place]? She is looking for suggestions.

## 2020-09-25 NOTE — Telephone Encounter (Signed)
Ok to change prescription to #100 and send to [place]. It is possible that she will get similar side effect from her 2nd booster, and also possible that [place] could be similar.  Her choice.  I DO recommend that she get a booster.

## 2020-10-30 DIAGNOSIS — D6869 Other thrombophilia: Secondary | ICD-10-CM | POA: Diagnosis not present

## 2020-10-30 DIAGNOSIS — I25119 Atherosclerotic heart disease of native coronary artery with unspecified angina pectoris: Secondary | ICD-10-CM | POA: Diagnosis not present

## 2020-10-30 DIAGNOSIS — M109 Gout, unspecified: Secondary | ICD-10-CM | POA: Diagnosis not present

## 2020-10-30 DIAGNOSIS — E669 Obesity, unspecified: Secondary | ICD-10-CM | POA: Diagnosis not present

## 2020-10-30 DIAGNOSIS — N3941 Urge incontinence: Secondary | ICD-10-CM | POA: Diagnosis not present

## 2020-10-30 DIAGNOSIS — I4891 Unspecified atrial fibrillation: Secondary | ICD-10-CM | POA: Diagnosis not present

## 2020-10-30 DIAGNOSIS — E785 Hyperlipidemia, unspecified: Secondary | ICD-10-CM | POA: Diagnosis not present

## 2020-10-30 DIAGNOSIS — N3281 Overactive bladder: Secondary | ICD-10-CM | POA: Diagnosis not present

## 2020-10-30 DIAGNOSIS — I10 Essential (primary) hypertension: Secondary | ICD-10-CM | POA: Diagnosis not present

## 2020-10-30 DIAGNOSIS — K219 Gastro-esophageal reflux disease without esophagitis: Secondary | ICD-10-CM | POA: Diagnosis not present

## 2020-11-05 DIAGNOSIS — Z20822 Contact with and (suspected) exposure to covid-19: Secondary | ICD-10-CM | POA: Diagnosis not present

## 2020-11-18 NOTE — Progress Notes (Signed)
Cardiology Office Note:    Date:  11/19/2020   ID:  Connie [name], DOB [birth year], MRN [id number]  PCP:  Rita Ohara, MD  Cardiologist:  Sinclair Grooms, MD   Referring MD: Rita Ohara, MD   Chief Complaint  Patient presents with   Atrial Fibrillation   Hypertension     History of Present Illness:    Connie [name] is a 87 y.o. female with a hx of  primary hypertension, hyperlipidemia, high burden paroxysmal atrial fibrillation (monitor 04/2020) on anticoagulation, and initiation Amiodarone to suppress AF.   She has recently returned from a cruise to the [place].  Saw [place] and also visited [place] and [place].  Had difficulty with dyspnea on long walking 2 hours.  No chest pain.  Did not feel she had atrial fibrillation.  Finally concluded that she probably felt about as good as a 87 year old could feel.  She did point out difficulty with her husband Delfino Lovett who had significant shortness of breath, could not keep case with the group, was stopped to take pictures and then had potential for getting lost.  She will alter their plans for future trips based upon these observations.  She has been compliant with her medical regimen and has had no significant difficulty.  Low-dose amiodarone, 100 mg/day has controlled recurrent atrial fibrillation.   Past Medical History:  Diagnosis Date   Abnormal brain MRI 8/01   small hemorrhagic stroke and possible cavernous hemangioma (Dr. Erling Cruz)   Adenomatous colon polyp 1/03   Arthritis    OA spine, hands   Breast cancer ([place]) 2001   R breast (T3N1, ER/PR+, HER-2 +) s/p mastectomy, chemo and chest wall irradiation (Dr. Truddie Coco)   DDD (degenerative disc disease), lumbar    GERD (gastroesophageal reflux disease)    Hearing loss 2012   wears hearing aids   Herpes zoster 2003   Hyperlipidemia    Hypertension    Onychomycosis 2003, 2008   treated with Lamisil   Osteopenia    (DEXA's done by Dr. Truddie Coco); prev took Fosamax.   No change in DEXA after off Fosamax x 2 years   Ovarian cyst 12/2008   right   Personal history of chemotherapy    Personal history of radiation therapy    Postmenopausal bleeding 2000   benign EMB (Dr. Raphael Gibney)   Urge urinary incontinence     Past Surgical History:  Procedure Laterality Date   CATARACT EXTRACTION  summer 2011   bilateral   CHOLECYSTECTOMY, LAPAROSCOPIC  1/05   COLONOSCOPY  12/17/08   normal (Dr. Wynetta Emery)   COLONOSCOPY W/ POLYPECTOMY  05/23/01   adenomatous polyp   MASTECTOMY Right 2001   modified mastectomy and tram flap reconstruction  2001   R breast   TONSILLECTOMY     tram flap removal     problems with flap after radiation   TUBAL LIGATION      Current Medications: Current Meds  Medication Sig   acetaminophen (TYLENOL) 650 MG CR tablet Take 1,300 mg by mouth 2 (two) times daily.   allopurinol (ZYLOPRIM) 100 MG tablet TAKE TWO TABLETS BY MOUTH DAILY   amiodarone (PACERONE) 200 MG tablet Take 0.5 tablets (100 mg total) by mouth daily.   calcium carbonate (TUMS - DOSED IN MG ELEMENTAL CALCIUM) 500 MG chewable tablet Chew 1 tablet by mouth daily as needed for indigestion or heartburn. Reported on 07/10/2015   Calcium Carbonate-Vitamin D 600-400 MG-UNIT tablet Take 1 tablet by mouth daily.  colchicine 0.6 MG tablet Use as directed. Take two tablets by mouth at start of gout flare up. Then one hour later take one tablet by mouth.   diltiazem (CARDIZEM CD) 240 MG 24 hr capsule TAKE ONE CAPSULE BY MOUTH DAILY   hydrochlorothiazide (MICROZIDE) 12.5 MG capsule TAKE ONE CAPSULE BY MOUTH DAILY   Multiple Vitamins-Minerals (CENTRUM SILVER PO) Take 1 tablet by mouth daily.   omeprazole (PRILOSEC) 20 MG capsule Take 1 capsule (20 mg total) by mouth daily. Reported on 05/23/2015   pravastatin (PRAVACHOL) 40 MG tablet Take 1 tablet (40 mg total) by mouth daily.   tolterodine (DETROL LA) 4 MG 24 hr capsule TAKE ONE CAPSULE BY MOUTH DAILY   XARELTO 15 MG TABS tablet TAKE  ONE TABLET BY MOUTH DAILY WITH SUPPER     Allergies:   Cephalexin, Ciprofloxacin, Diclofenac, Shellfish-derived products, and Tape   Social History   Socioeconomic History   Marital status: Married    Spouse name: Not on file   Number of children: 3   Years of education: Not on file   Highest education level: Not on file  Occupational History   Not on file  Tobacco Use   Smoking status: Never   Smokeless tobacco: Never  Vaping Use   Vaping Use: Never used  Substance and Sexual Activity   Alcohol use: No   Drug use: No   Sexual activity: Not Currently    Comment: due to poor libido and pain  Other Topics Concern   Not on file  Social History Narrative   Moved 06/2018 to [place]. Lives with husband.   1 daughter at [facility]--hospitalist; daughter in [place] (works in Conservator, museum/gallery at [employer] took in Barrister's clerk), son in [place]. 8 grandchildren, 4 great-grandchildren      Updated 07/2020   Social Determinants of Health   Financial Resource Strain: Not on file  Food Insecurity: Not on file  Transportation Needs: Not on file  Physical Activity: Not on file  Stress: Not on file  Social Connections: Not on file     Family History: The patient's family history includes Diabetes in her brother; Heart disease in her brother, father, and mother; Hypertension in her father and mother. There is no history of Drug abuse, Cancer, or Breast cancer.  ROS:   Please see the history of present illness.    Back pain was a significant issue for her during travel and prior to leaving for the trip.  Doing better today.  Dealing somewhat with jet lag.  All other systems reviewed and are negative.  EKGs/Labs/Other Studies Reviewed:    The following studies were reviewed today: No new imaging data  EKG:  EKG normal sinus rhythm, borderline QT, normal PR interval, overall normal appearance.  Recent Labs: 07/24/2020: ALT 15; BUN 11; Creatinine, Ser 1.17; Hemoglobin 13.5; Platelets 174;  Potassium 3.5; Sodium 142; TSH 3.740  Recent Lipid Panel    Component Value Date/Time   CHOL 157 07/24/2020 0903   TRIG 172 (H) 07/24/2020 0903   HDL 49 07/24/2020 0903   CHOLHDL 3.2 07/24/2020 0903   CHOLHDL 3.6 03/18/2017 1343   VLDL 41 (H) 12/08/2016 0755   LDLCALC 79 07/24/2020 0903   LDLCALC 84 03/18/2017 1343    Physical Exam:    VS:  BP 128/64   Pulse 79   Ht '5\' 1"'  (1.549 m)   Wt 160 lb 12.8 oz (72.9 kg)   SpO2 95%   BMI 30.38 kg/m  Wt Readings from Last 3 Encounters:  11/19/20 160 lb 12.8 oz (72.9 kg)  07/29/20 161 lb (73 kg)  07/18/20 160 lb (72.6 kg)     GEN: Appears healthy and consistent with stated age. No acute distress HEENT: Normal NECK: No JVD. LYMPHATICS: No lymphadenopathy CARDIAC: No murmur. RRR no years gallop, or edema. VASCULAR:  Normal Pulses. No bruits. RESPIRATORY:  Clear to auscultation without rales, wheezing or rhonchi  ABDOMEN: Soft, non-tender, non-distended, No pulsatile mass, MUSCULOSKELETAL: No deformity  SKIN: Warm and dry NEUROLOGIC:  Alert and oriented x 3 PSYCHIATRIC:  Normal affect   ASSESSMENT:    1. PAF (paroxysmal atrial fibrillation) ([place])   2. Chronic anticoagulation   3. Long term current use of amiodarone   4. Essential hypertension   5. Pure hypercholesterolemia    PLAN:    In order of problems listed above:  Rhythm control on amiodarone 100 mg/day.  TSH and liver panel done in March were unremarkable.  These will be repeated by Dr. Rita Ohara on the upcoming office visit in September. No bleeding complications on Xarelto 15 mg/day.  We will continue to monitor.  Needs to have a hemoglobin done twice yearly.  Most recent hemoglobin was 13.5 in March 2022. TSH and liver panel were done in March and will be repeated in September. Excellent blood pressure control today.  No change in HCTZ which, diltiazem 60 dosings. Pravachol 40 mg/day will be continued.   Medication Adjustments/Labs and Tests  Ordered: Current medicines are reviewed at length with the patient today.  Concerns regarding medicines are outlined above.  Orders Placed This Encounter  Procedures   EKG 12-Lead   No orders of the defined types were placed in this encounter.   Patient Instructions  Medication Instructions:  Your physician recommends that you continue on your current medications as directed. Please refer to the Current Medication list given to you today.  *If you need a refill on your cardiac medications before your next appointment, please call your pharmacy*   Lab Work: None If you have labs (blood work) drawn today and your tests are completely normal, you will receive your results only by: [place] (if you have MyChart) OR A paper copy in the mail If you have any lab test that is abnormal or we need to change your treatment, we will call you to review the results.   Testing/Procedures: None   Follow-Up: At [facility], you and your health needs are our priority.  As part of our continuing mission to provide you with exceptional heart care, we have created designated Provider Care Teams.  These Care Teams include your primary Cardiologist (physician) and Advanced Practice Providers (APPs -  Physician Assistants and Nurse Practitioners) who all work together to provide you with the care you need, when you need it.  We recommend signing up for the patient portal called "MyChart".  Sign up information is provided on this After Visit Summary.  MyChart is used to connect with patients for Virtual Visits (Telemedicine).  Patients are able to view lab/test results, encounter notes, upcoming appointments, etc.  Non-urgent messages can be sent to your provider as well.   To learn more about what you can do with MyChart, go to [email or web address].    Your next appointment:   1 year(s)  The format for your next appointment:   In Person  Provider:   You may see Sinclair Grooms, MD or  one of the following Advanced Practice Providers  on your designated Care Team:   Cecilie Kicks, NP    Other Instructions     Signed, Sinclair Grooms, MD  11/19/2020 5:39 PM    [place]

## 2020-11-19 ENCOUNTER — Ambulatory Visit: Payer: Medicare HMO | Admitting: Interventional Cardiology

## 2020-11-19 ENCOUNTER — Other Ambulatory Visit: Payer: Self-pay

## 2020-11-19 ENCOUNTER — Encounter: Payer: Self-pay | Admitting: Interventional Cardiology

## 2020-11-19 VITALS — BP 128/64 | HR 79 | Ht 61.0 in | Wt 160.8 lb

## 2020-11-19 DIAGNOSIS — Z79899 Other long term (current) drug therapy: Secondary | ICD-10-CM

## 2020-11-19 DIAGNOSIS — I480 Paroxysmal atrial fibrillation: Secondary | ICD-10-CM | POA: Diagnosis not present

## 2020-11-19 DIAGNOSIS — E7800 Pure hypercholesterolemia, unspecified: Secondary | ICD-10-CM | POA: Diagnosis not present

## 2020-11-19 DIAGNOSIS — I10 Essential (primary) hypertension: Secondary | ICD-10-CM

## 2020-11-19 DIAGNOSIS — Z7901 Long term (current) use of anticoagulants: Secondary | ICD-10-CM | POA: Diagnosis not present

## 2020-11-19 NOTE — Patient Instructions (Signed)

## 2020-11-20 DIAGNOSIS — C50911 Malignant neoplasm of unspecified site of right female breast: Secondary | ICD-10-CM | POA: Diagnosis not present

## 2020-11-21 ENCOUNTER — Ambulatory Visit: Payer: Medicare HMO | Admitting: Sports Medicine

## 2020-11-21 ENCOUNTER — Other Ambulatory Visit: Payer: Self-pay

## 2020-11-21 ENCOUNTER — Encounter: Payer: Self-pay | Admitting: Sports Medicine

## 2020-11-21 DIAGNOSIS — Z7901 Long term (current) use of anticoagulants: Secondary | ICD-10-CM

## 2020-11-21 DIAGNOSIS — M79671 Pain in right foot: Secondary | ICD-10-CM

## 2020-11-21 DIAGNOSIS — M79675 Pain in left toe(s): Secondary | ICD-10-CM

## 2020-11-21 DIAGNOSIS — M79674 Pain in right toe(s): Secondary | ICD-10-CM | POA: Diagnosis not present

## 2020-11-21 DIAGNOSIS — M79672 Pain in left foot: Secondary | ICD-10-CM

## 2020-11-21 DIAGNOSIS — B351 Tinea unguium: Secondary | ICD-10-CM | POA: Diagnosis not present

## 2020-11-21 DIAGNOSIS — L84 Corns and callosities: Secondary | ICD-10-CM

## 2020-11-21 NOTE — Progress Notes (Signed)
Subjective: Connie [name] is a 87 y.o. female patient seen today in office with complaint of painful thickened and elongated toenails and callus; unable to trim.  Reports that her nails are long and need to be trimmed.  Patient denies changes with medical history; still on Xalerto for Afib like before with no changes.  No new issues noted.  Patient Active Problem List   Diagnosis Date Noted   Low back pain 04/11/2020   Mixed hyperlipidemia 04/11/2020   Onychomycosis 04/11/2020   Chronic anticoagulation 05/11/2016   Atrial fibrillation with rapid ventricular response ([place]) 06/24/2015   Gout 10/30/2013   Gout of big toe 10/30/2013   Osteoarthritis of multiple joints 10/30/2013   Breast cancer, right breast ([place]) 11/25/2012   Urge incontinence of urine 04/24/2011   Arthritis 04/24/2011   Essential hypertension, benign 10/23/2010   Pure hypercholesterolemia 10/23/2010    Current Outpatient Medications on File Prior to Visit  Medication Sig Dispense Refill   acetaminophen (TYLENOL) 650 MG CR tablet Take 1,300 mg by mouth 2 (two) times daily.     allopurinol (ZYLOPRIM) 100 MG tablet TAKE TWO TABLETS BY MOUTH DAILY 180 tablet 0   amiodarone (PACERONE) 200 MG tablet Take 0.5 tablets (100 mg total) by mouth daily. 45 tablet 3   calcium carbonate (TUMS - DOSED IN MG ELEMENTAL CALCIUM) 500 MG chewable tablet Chew 1 tablet by mouth daily as needed for indigestion or heartburn. Reported on 07/10/2015     Calcium Carbonate-Vitamin D 600-400 MG-UNIT tablet Take 1 tablet by mouth daily.      colchicine 0.6 MG tablet Use as directed. Take two tablets by mouth at start of gout flare up. Then one hour later take one tablet by mouth.     diltiazem (CARDIZEM CD) 240 MG 24 hr capsule TAKE ONE CAPSULE BY MOUTH DAILY 90 capsule 3   hydrochlorothiazide (MICROZIDE) 12.5 MG capsule TAKE ONE CAPSULE BY MOUTH DAILY 90 capsule 3   Multiple Vitamins-Minerals (CENTRUM SILVER PO) Take 1 tablet by mouth daily.      omeprazole (PRILOSEC) 20 MG capsule Take 1 capsule (20 mg total) by mouth daily. Reported on 05/23/2015     pravastatin (PRAVACHOL) 40 MG tablet Take 1 tablet (40 mg total) by mouth daily. 90 tablet 1   tolterodine (DETROL LA) 4 MG 24 hr capsule TAKE ONE CAPSULE BY MOUTH DAILY 90 capsule 0   XARELTO 15 MG TABS tablet TAKE ONE TABLET BY MOUTH DAILY WITH SUPPER 90 tablet 1   No current facility-administered medications on file prior to visit.    Allergies  Allergen Reactions   Cephalexin Itching   Ciprofloxacin Itching   Diclofenac Nausea Only    Upset stomach   Shellfish-Derived Products Nausea And Vomiting   Tape Rash    Objective: Physical Exam  General: Well developed, nourished, no acute distress, awake, alert and oriented x 3  Vascular: Dorsalis pedis artery 1/4 bilateral, Posterior tibial artery 0/4 bilateral, skin temperature warm to warm proximal to distal bilateral lower extremities, trace edema at ankles bilateral, pedal hair present bilateral.  Neurological: Gross sensation present via light touch bilateral.   Dermatological: Skin is warm, dry, and supple bilateral, Nails 1-10 are tender, long, thick, and discolored with mild subungal debris, no webspace macerations present bilateral, no open lesions present bilateral, minimal reactive keratosis medial aspect of right bunion greater than plantar left forefoot. No signs of infection bilateral.  Abrasion to anterior shin on the right that appears to be healing well without  any issues.  Musculoskeletal: Asymptomatic bunion boney deformities noted bilateral. Muscular strength within normal limits without painon range of motion. No pain with calf compression bilateral.   Assessment and Plan:  Problem List Items Addressed This Visit   None Visit Diagnoses     Pain due to onychomycosis of toenails of both feet    -  Primary   Callus of foot       Anticoagulant long-term use       Foot pain, bilateral           -Examined patient.  -Re-Discussed continued care for painful mycotic toenails and callus -Mechanically debrided and reduced mycotic nails with sterile nail nipper and dremel nail file without incident and at no additional smooth callus plantar right foot using rotary bur without incident  -Continue with good supportive shoes for foot type like previous -Patient to return in 2.5 months for follow up evaluation or sooner if symptoms worsen.  Landis Martins, DPM

## 2020-12-23 ENCOUNTER — Other Ambulatory Visit: Payer: Self-pay | Admitting: Family Medicine

## 2020-12-27 ENCOUNTER — Other Ambulatory Visit: Payer: Self-pay

## 2020-12-27 MED ORDER — ALLOPURINOL 100 MG PO TABS: 200.0000 mg | ORAL_TABLET | Freq: Every day | ORAL | 0 refills | Status: DC

## 2020-12-27 MED ORDER — RIVAROXABAN 15 MG PO TABS: ORAL_TABLET | ORAL | 1 refills | Status: DC

## 2020-12-27 NOTE — Telephone Encounter (Signed)
Pt last saw Dr Tamala Julian 11/19/20, last labs Creat 1.17, age 87, weight 72.9kg, CrCl 36.04, based on CrCl pt is on appropriate dosage of Xarelto '15mg'$  QD.  Will refill rx.

## 2021-01-04 ENCOUNTER — Telehealth: Payer: Self-pay

## 2021-01-04 NOTE — Telephone Encounter (Signed)
Unable to complete P.A. TOLTERODINE online rejects says call plan t# [phone]

## 2021-01-23 ENCOUNTER — Other Ambulatory Visit: Payer: Medicare HMO

## 2021-01-27 ENCOUNTER — Other Ambulatory Visit: Payer: Self-pay | Admitting: Family Medicine

## 2021-01-27 ENCOUNTER — Other Ambulatory Visit: Payer: Self-pay

## 2021-01-27 ENCOUNTER — Other Ambulatory Visit: Payer: Medicare HMO

## 2021-01-27 DIAGNOSIS — Z5181 Encounter for therapeutic drug level monitoring: Secondary | ICD-10-CM

## 2021-01-27 DIAGNOSIS — M109 Gout, unspecified: Secondary | ICD-10-CM

## 2021-01-27 DIAGNOSIS — Z7901 Long term (current) use of anticoagulants: Secondary | ICD-10-CM

## 2021-01-27 DIAGNOSIS — R7303 Prediabetes: Secondary | ICD-10-CM | POA: Diagnosis not present

## 2021-01-27 DIAGNOSIS — E782 Mixed hyperlipidemia: Secondary | ICD-10-CM

## 2021-01-27 DIAGNOSIS — I10 Essential (primary) hypertension: Secondary | ICD-10-CM

## 2021-01-27 DIAGNOSIS — N1832 Chronic kidney disease, stage 3b: Secondary | ICD-10-CM | POA: Diagnosis not present

## 2021-01-27 DIAGNOSIS — I480 Paroxysmal atrial fibrillation: Secondary | ICD-10-CM

## 2021-01-28 LAB — CBC WITH DIFFERENTIAL/PLATELET
Basophils Absolute: 0.1 10*3/uL (ref 0.0–0.2)
Basos: 1 %
EOS (ABSOLUTE): 0.2 10*3/uL (ref 0.0–0.4)
Eos: 3 %
Hematocrit: 38.2 % (ref 34.0–46.6)
Hemoglobin: 12.8 g/dL (ref 11.1–15.9)
Immature Grans (Abs): 0 10*3/uL (ref 0.0–0.1)
Immature Granulocytes: 0 %
Lymphocytes Absolute: 1.7 10*3/uL (ref 0.7–3.1)
Lymphs: 27 %
MCH: 31 pg (ref 26.6–33.0)
MCHC: 33.5 g/dL (ref 31.5–35.7)
MCV: 93 fL (ref 79–97)
Monocytes Absolute: 0.5 10*3/uL (ref 0.1–0.9)
Monocytes: 7 %
Neutrophils Absolute: 3.9 10*3/uL (ref 1.4–7.0)
Neutrophils: 62 %
Platelets: 184 10*3/uL (ref 150–450)
RBC: 4.13 x10E6/uL (ref 3.77–5.28)
RDW: 13.1 % (ref 11.7–15.4)
WBC: 6.3 10*3/uL (ref 3.4–10.8)

## 2021-01-28 LAB — COMPREHENSIVE METABOLIC PANEL
ALT: 18 IU/L (ref 0–32)
AST: 20 IU/L (ref 0–40)
Albumin/Globulin Ratio: 2.3 — ABNORMAL HIGH (ref 1.2–2.2)
Albumin: 4.3 g/dL (ref 3.5–4.6)
Alkaline Phosphatase: 113 IU/L (ref 44–121)
BUN/Creatinine Ratio: 25 (ref 12–28)
BUN: 31 mg/dL (ref 10–36)
Bilirubin Total: 0.3 mg/dL (ref 0.0–1.2)
CO2: 24 mmol/L (ref 20–29)
Calcium: 9.6 mg/dL (ref 8.7–10.3)
Chloride: 104 mmol/L (ref 96–106)
Creatinine, Ser: 1.22 mg/dL — ABNORMAL HIGH (ref 0.57–1.00)
Globulin, Total: 1.9 g/dL (ref 1.5–4.5)
Glucose: 115 mg/dL — ABNORMAL HIGH (ref 65–99)
Potassium: 4.2 mmol/L (ref 3.5–5.2)
Sodium: 147 mmol/L — ABNORMAL HIGH (ref 134–144)
Total Protein: 6.2 g/dL (ref 6.0–8.5)
eGFR: 42 mL/min/{1.73_m2} — ABNORMAL LOW (ref >59)

## 2021-01-28 LAB — LIPID PANEL
Chol/HDL Ratio: 3.2 ratio (ref 0.0–4.4)
Cholesterol, Total: 155 mg/dL (ref 100–199)
HDL: 49 mg/dL (ref >39)
LDL Chol Calc (NIH): 81 mg/dL (ref 0–99)
Triglycerides: 141 mg/dL (ref 0–149)
VLDL Cholesterol Cal: 25 mg/dL (ref 5–40)

## 2021-01-28 LAB — HEMOGLOBIN A1C
Est. average glucose Bld gHb Est-mCnc: 126 mg/dL
Hgb A1c MFr Bld: 6 % — ABNORMAL HIGH (ref 4.8–5.6)

## 2021-01-28 LAB — URIC ACID: Uric Acid: 4.9 mg/dL (ref 3.1–7.9)

## 2021-01-28 LAB — TSH: TSH: 3.82 u[IU]/mL (ref 0.450–4.500)

## 2021-01-28 NOTE — Progress Notes (Deleted)
Patient presents for 6 month follow-up of chronic problems. See below for labs done prior to today's visit.  She last saw Dr. Tamala Julian (cardiologist) in July, at which time she was doing well, rhythm controlled with amiodarone. She continues on verapamil and Xarelto. She denies any bleeding. See below for labs done to monitor these  medications. Pt has HTN, and is on diltiazem 240 and HCTZ. BP's have been running  Gout:  She continues to do well with 200mg  of allopurinol, without any gout flares (great toe).  She uses Tylenol Arthritis 2 BID, which controls her pain (feet, hands, back).    Hyperlipidemia:  Compliant with 40mg  of pravastatin without side effects.  She hasn't tolerated fish oil in the past (due to belching).  She was put on Lovaza but had to change to [place] for insurance reasons  She did not tolerate Vascepa due to acid reflux--took it for a week, and wasn't able to tolerate even just 1 capsule daily.  She is no longer taking any fish oil, tries to watch her diet. TG were improved/normal on recent check (see below). Normal diet is occasional fried fish and desserts 2x/week.     Urge urinary incontinence--Taking Detrol LA 4mg .  Most of the time it works well. Occasionally has a night (about once a month) where she is up every 2 hours at night (no frequency during the day).  She usually gets up just 2-3x/night. Humidifier helps with dry mouth. Denies any dysuria, odor or hematuria. She wears Poise pads daily, due to the urgency and incontinence if she is not close enough to the bathroom.Marland Kitchen     GERD--She has reflux about once or twice a month, and will take either Prilosec or Tums, or both.  She doesn't eat spicy food or eat late (eats 6pm). Denies dysphagia.   PMH, PSH, SH reviewed    ROS: no fever, chills, URI symptoms, chest pain, cough, shortness of breath. No dysuria, hematuria.  Other urinary complaints per HPI. No nausea, vomiting. Occasional heartburn per HPI. No bleeding,  bruising, rash. Joint pains are controlled with tylenol Some ankle swelling at night, resolved by morning.     PHYSICAL EXAM:  There were no vitals taken for this visit. Wt Readings from Last 3 Encounters:  11/19/20 160 lb 12.8 oz (72.9 kg)  07/29/20 161 lb (73 kg)  07/18/20 160 lb (72.6 kg)    Well developed, pleasant female, in good spirits, talkative. HEENT: PERRL, EOMI, conjunctiva clear. Wearing mask due to COVID-19 pandemic Neck: no lymphadenopathy, thyromegaly or carotid bruit Heart: regular rate and rhythm. No heart murmur Lungs: clear bilaterally Back: no CVA or spinal tenderness Abdomen: soft, nontender, no organomegaly or mass Extremities: No edema. 2+ pulses. Wearing compression sleeve on RUE Skin: no rashes.  Normal turgor.  Neuro: alert and oriented. Normal gait. Psych: normal mood, affect, hygiene and grooming     Chemistry      Component Value Date/Time   NA 147 (H) 01/27/2021 0857   NA 141 11/11/2012 1020   K 4.2 01/27/2021 0857   K 4.1 11/11/2012 1020   CL 104 01/27/2021 0857   CO2 24 01/27/2021 0857   CO2 26 11/11/2012 1020   BUN 31 01/27/2021 0857   BUN 31.6 (H) 11/11/2012 1020   CREATININE 1.22 (H) 01/27/2021 0857   CREATININE 1.16 (H) 12/08/2016 0755   CREATININE 1.5 (H) 11/11/2012 1020      Component Value Date/Time   CALCIUM 9.6 01/27/2021 0857   CALCIUM 10.1  11/11/2012 1020   ALKPHOS 113 01/27/2021 0857   ALKPHOS 78 11/11/2012 1020   AST 20 01/27/2021 0857   AST 19 11/11/2012 1020   ALT 18 01/27/2021 0857   ALT 17 11/11/2012 1020   BILITOT 0.3 01/27/2021 0857   BILITOT 0.43 11/11/2012 1020     Fasting glucose 116  Lab Results  Component Value Date   HGBA1C 6.0 (H) 01/27/2021    Lab Results  Component Value Date   WBC 6.3 01/27/2021   HGB 12.8 01/27/2021   HCT 38.2 01/27/2021   MCV 93 01/27/2021   PLT 184 01/27/2021   Lab Results  Component Value Date   TSH 3.820 01/27/2021   Lab Results  Component Value Date    CHOL 155 01/27/2021   HDL 49 01/27/2021   LDLCALC 81 01/27/2021   TRIG 141 01/27/2021   CHOLHDL 3.2 01/27/2021   Lab Results  Component Value Date   LABURIC 4.9 01/27/2021    ASSESSMENT/PLAN:  Offer flu shot Enter 2nd [place] booster if she had, offer booster (now if wants pfizer, or 2 weeks)  Refill pravastatin  Send labs to Dr. Tamala Julian

## 2021-01-29 ENCOUNTER — Other Ambulatory Visit: Payer: Self-pay | Admitting: *Deleted

## 2021-01-29 ENCOUNTER — Encounter: Payer: Medicare HMO | Admitting: Family Medicine

## 2021-01-29 DIAGNOSIS — E782 Mixed hyperlipidemia: Secondary | ICD-10-CM

## 2021-01-29 DIAGNOSIS — I10 Essential (primary) hypertension: Secondary | ICD-10-CM

## 2021-01-29 DIAGNOSIS — I480 Paroxysmal atrial fibrillation: Secondary | ICD-10-CM

## 2021-01-29 DIAGNOSIS — M109 Gout, unspecified: Secondary | ICD-10-CM

## 2021-01-29 DIAGNOSIS — N3941 Urge incontinence: Secondary | ICD-10-CM

## 2021-01-29 DIAGNOSIS — Z7901 Long term (current) use of anticoagulants: Secondary | ICD-10-CM

## 2021-01-29 DIAGNOSIS — R7303 Prediabetes: Secondary | ICD-10-CM

## 2021-01-29 MED ORDER — PRAVASTATIN SODIUM 40 MG PO TABS
40.0000 mg | ORAL_TABLET | Freq: Every day | ORAL | 0 refills | Status: DC
Start: 2021-01-29 — End: 2021-02-12

## 2021-01-30 ENCOUNTER — Ambulatory Visit: Payer: Medicare HMO | Admitting: Sports Medicine

## 2021-01-30 ENCOUNTER — Other Ambulatory Visit: Payer: Self-pay

## 2021-01-30 ENCOUNTER — Encounter: Payer: Self-pay | Admitting: Sports Medicine

## 2021-01-30 ENCOUNTER — Other Ambulatory Visit: Payer: Self-pay | Admitting: Family Medicine

## 2021-01-30 DIAGNOSIS — M79674 Pain in right toe(s): Secondary | ICD-10-CM

## 2021-01-30 DIAGNOSIS — Z7901 Long term (current) use of anticoagulants: Secondary | ICD-10-CM

## 2021-01-30 DIAGNOSIS — L84 Corns and callosities: Secondary | ICD-10-CM

## 2021-01-30 DIAGNOSIS — M79671 Pain in right foot: Secondary | ICD-10-CM

## 2021-01-30 DIAGNOSIS — M79675 Pain in left toe(s): Secondary | ICD-10-CM

## 2021-01-30 DIAGNOSIS — M79672 Pain in left foot: Secondary | ICD-10-CM

## 2021-01-30 DIAGNOSIS — E782 Mixed hyperlipidemia: Secondary | ICD-10-CM

## 2021-01-30 DIAGNOSIS — B351 Tinea unguium: Secondary | ICD-10-CM | POA: Diagnosis not present

## 2021-01-30 NOTE — Progress Notes (Signed)
Subjective: Connie [name] is a 87 y.o. female patient seen today in office with complaint of painful thickened and elongated toenails and callus; unable to trim.  Reports that her nails are long and need to be trimmed.  Patient denies changes with medical history; still on Xalerto for Afib like before with no changes.  Reports that they recently came back from vacation.  No new issues noted.  Patient Active Problem List   Diagnosis Date Noted   Low back pain 04/11/2020   Mixed hyperlipidemia 04/11/2020   Onychomycosis 04/11/2020   Chronic anticoagulation 05/11/2016   Atrial fibrillation with rapid ventricular response ([place]) 06/24/2015   Gout 10/30/2013   Gout of big toe 10/30/2013   Osteoarthritis of multiple joints 10/30/2013   Breast cancer, right breast ([place]) 11/25/2012   Urge incontinence of urine 04/24/2011   Arthritis 04/24/2011   Essential hypertension, benign 10/23/2010   Pure hypercholesterolemia 10/23/2010    Current Outpatient Medications on File Prior to Visit  Medication Sig Dispense Refill   acetaminophen (TYLENOL) 650 MG CR tablet Take 1,300 mg by mouth 2 (two) times daily.     allopurinol (ZYLOPRIM) 100 MG tablet Take 2 tablets (200 mg total) by mouth daily. 180 tablet 0   amiodarone (PACERONE) 200 MG tablet Take 0.5 tablets (100 mg total) by mouth daily. 45 tablet 3   calcium carbonate (TUMS - DOSED IN MG ELEMENTAL CALCIUM) 500 MG chewable tablet Chew 1 tablet by mouth daily as needed for indigestion or heartburn. Reported on 07/10/2015     Calcium Carbonate-Vitamin D 600-400 MG-UNIT tablet Take 1 tablet by mouth daily.      colchicine 0.6 MG tablet Use as directed. Take two tablets by mouth at start of gout flare up. Then one hour later take one tablet by mouth.     diltiazem (CARDIZEM CD) 240 MG 24 hr capsule TAKE ONE CAPSULE BY MOUTH DAILY 90 capsule 3   hydrochlorothiazide (MICROZIDE) 12.5 MG capsule TAKE ONE CAPSULE BY MOUTH DAILY 90 capsule 3   Multiple  Vitamins-Minerals (CENTRUM SILVER PO) Take 1 tablet by mouth daily.     omeprazole (PRILOSEC) 20 MG capsule Take 1 capsule (20 mg total) by mouth daily. Reported on 05/23/2015     pravastatin (PRAVACHOL) 40 MG tablet Take 1 tablet (40 mg total) by mouth daily. 30 tablet 0   Rivaroxaban (XARELTO) 15 MG TABS tablet TAKE ONE TABLET BY MOUTH DAILY WITH SUPPER 90 tablet 1   tolterodine (DETROL LA) 4 MG 24 hr capsule TAKE ONE CAPSULE BY MOUTH DAILY 90 capsule 0   No current facility-administered medications on file prior to visit.    Allergies  Allergen Reactions   Cephalexin Itching   Ciprofloxacin Itching   Diclofenac Nausea Only    Upset stomach   Shellfish-Derived Products Nausea And Vomiting   Tape Rash    Objective: Physical Exam  General: Well developed, nourished, no acute distress, awake, alert and oriented x 3  Vascular: Dorsalis pedis artery 1/4 bilateral, Posterior tibial artery 0/4 bilateral, skin temperature warm to warm proximal to distal bilateral lower extremities, trace edema at ankles bilateral, pedal hair present bilateral.  Neurological: Gross sensation present via light touch bilateral.   Dermatological: Skin is warm, dry, and supple bilateral, Nails 1-10 are tender, long, thick, and discolored with mild subungal debris, no webspace macerations present bilateral, no open lesions present bilateral, minimal reactive keratosis medial aspect of right bunion greater than plantar left forefoot. No signs of infection bilateral as previously  noted.  Musculoskeletal: Asymptomatic bunion boney deformities noted bilateral. Muscular strength within normal limits without painon range of motion. No pain with calf compression bilateral.   Assessment and Plan:  Problem List Items Addressed This Visit   None Visit Diagnoses     Pain due to onychomycosis of toenails of both feet    -  Primary   Callus of foot       Anticoagulant long-term use       Foot pain, bilateral           -Examined patient.  -Re-Discussed continued care for painful mycotic toenails and callus -Mechanically debrided and reduced mycotic nails with sterile nail nipper and dremel nail file without incident and at no additional smooth callus plantar right foot using rotary bur without incident  -Patient to return in 2.5 months for follow up evaluation or sooner if symptoms worsen.  Landis Martins, DPM

## 2021-01-31 NOTE — Telephone Encounter (Signed)
Called plan t# [phone] because I never received faxed form called plan and was able to get approved over the phone and they back dated from 05/04/20 til end of year.

## 2021-02-05 DIAGNOSIS — D2271 Melanocytic nevi of right lower limb, including hip: Secondary | ICD-10-CM | POA: Diagnosis not present

## 2021-02-05 DIAGNOSIS — L905 Scar conditions and fibrosis of skin: Secondary | ICD-10-CM | POA: Diagnosis not present

## 2021-02-05 DIAGNOSIS — D692 Other nonthrombocytopenic purpura: Secondary | ICD-10-CM | POA: Diagnosis not present

## 2021-02-05 DIAGNOSIS — Z85828 Personal history of other malignant neoplasm of skin: Secondary | ICD-10-CM | POA: Diagnosis not present

## 2021-02-05 DIAGNOSIS — D2261 Melanocytic nevi of right upper limb, including shoulder: Secondary | ICD-10-CM | POA: Diagnosis not present

## 2021-02-05 DIAGNOSIS — D2262 Melanocytic nevi of left upper limb, including shoulder: Secondary | ICD-10-CM | POA: Diagnosis not present

## 2021-02-05 DIAGNOSIS — D224 Melanocytic nevi of scalp and neck: Secondary | ICD-10-CM | POA: Diagnosis not present

## 2021-02-05 DIAGNOSIS — D1801 Hemangioma of skin and subcutaneous tissue: Secondary | ICD-10-CM | POA: Diagnosis not present

## 2021-02-05 DIAGNOSIS — L821 Other seborrheic keratosis: Secondary | ICD-10-CM | POA: Diagnosis not present

## 2021-02-11 NOTE — Progress Notes (Signed)
Chief Complaint  Patient presents with   Medication Management    Nonfasting med check, labs already done. 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 Labs sent to Dr. Daneen Schick. Has been having heavy congestion, occasional bloody tinge and spots on her pillow-wonders if she is having bronchial or sinus issues. Has been experiencing fatigue, tired really quickly. Left hip twinges and then in the last 5 days her right hip started. Makes it difficult to get around.     Patient presents for 6 month follow-up of chronic problems. See below for labs done prior to today's visit.  R lateral hip pain for about 5 days. No change in activity, fall or any known injury.   She last saw Dr. Tamala Julian (cardiologist) in July, at which time she was doing well, rhythm controlled with amiodarone. She continues on diltiazem 266m and Xarelto. She denies any significant bleeding. She did notice a very small amount of blood on her pillowcase (small spots, and this resolved). See below for labs done to monitor these  medications. Pt has HTN, and is on diltiazem 240 and HCTZ. BP's have been running 120-150, she forgot to bring her list.   Gout:  She continues to do well with 2060mof allopurinol, without any gout flares (great toe).  She uses Tylenol Arthritis 2 BID, which controls her pain (feet, hands, back).   Hyperlipidemia:  Compliant with 4016mf pravastatin without side effects.  She hasn't tolerated fish oil in the past (due to belching).  She was put on Lovaza but had to change to [place] reasons  She did not tolerate Vascepa due to acid reflux--took it for a week, and wasn't able to tolerate even just 1 capsule daily.  She is no longer taking any fish oil, tries to watch her diet. TG were improved/normal on recent check (see below). Normal diet is occasional fried fish and desserts 2x/week (not usually facility's desserts, but will get [employer] ice cream).   Urge  urinary incontinence--Taking Detrol LA 4mg46mMost of the time it works well. Occasionally has a night (about once a month) where she is up every 2 hours at night (no frequency during the day).  She usually gets up just 2-3x/night. Humidifier helps with dry mouth (didn't use through summer, but plans to restart soon). Denies any dysuria, odor or hematuria. She wears Poise pads daily, due to the urgency and incontinence if she is not close enough to the bathroom..   Marland Kitchen GERD--She has reflux about once or twice a month, and will take either Prilosec or Tums, or both.  She doesn't eat spicy food or eat late (eats 6pm). Denies dysphagia.   Pre-diabetes--enjoys good breads and ice cream. A1c 6% 6 months ago    PMH, PSH, SH reviewed   Outpatient Encounter Medications as of 02/12/2021  Medication Sig   acetaminophen (TYLENOL) 650 MG CR tablet Take 1,300 mg by mouth 2 (two) times daily.   allopurinol (ZYLOPRIM) 100 MG tablet Take 2 tablets (200 mg total) by mouth daily.   amiodarone (PACERONE) 200 MG tablet Take 0.5 tablets (100 mg total) by mouth daily.   Calcium Carbonate-Vitamin D 600-400 MG-UNIT tablet Take 1 tablet by mouth daily.    diltiazem (CARDIZEM CD) 240 MG 24 hr capsule TAKE ONE CAPSULE BY MOUTH DAILY   hydrochlorothiazide (MICROZIDE) 12.5 MG capsule TAKE ONE CAPSULE BY MOUTH DAILY   Multiple Vitamins-Minerals (CENTRUM SILVER  PO) Take 1 tablet by mouth daily.   pravastatin (PRAVACHOL) 40 MG tablet Take 1 tablet (40 mg total) by mouth daily.   Rivaroxaban (XARELTO) 15 MG TABS tablet TAKE ONE TABLET BY MOUTH DAILY WITH SUPPER   tolterodine (DETROL LA) 4 MG 24 hr capsule TAKE ONE CAPSULE BY MOUTH DAILY   calcium carbonate (TUMS - DOSED IN MG ELEMENTAL CALCIUM) 500 MG chewable tablet Chew 1 tablet by mouth daily as needed for indigestion or heartburn. Reported on 07/10/2015 (Patient not taking: Reported on 02/12/2021)   colchicine 0.6 MG tablet Use as directed. Take two tablets by mouth at start  of gout flare up. Then one hour later take one tablet by mouth. (Patient not taking: Reported on 02/12/2021)   omeprazole (PRILOSEC) 20 MG capsule Take 1 capsule (20 mg total) by mouth daily. Reported on 05/23/2015 (Patient not taking: Reported on 02/12/2021)   No facility-administered encounter medications on file as of 02/12/2021.   Allergies  Allergen Reactions   Cephalexin Itching   Ciprofloxacin Itching   Diclofenac Nausea Only    Upset stomach   Shellfish-Derived Products Nausea And Vomiting   Tape Rash     ROS: no fever, chills, URI symptoms, chest pain, cough, shortness of breath. No dysuria, hematuria.  Other urinary complaints per HPI. No nausea, vomiting. Occasional heartburn per HPI. No bleeding (except as recently noted on pillowcase), bruising, rash. Joint pains are controlled with tylenol Some ankle swelling at night, resolved by morning. She is having some postnasal drainage, with slight cough. She denies any nasal congestion or runny nose.      PHYSICAL EXAM:   BP 132/64   Pulse 72   Ht _0  (1.549 m)   Wt 160 lb 12.8 oz (72.9 kg)   BMI 30.38 kg/m  Wt Readings from Last 3 Encounters:  02/12/21 160 lb 12.8 oz (72.9 kg)  11/19/20 160 lb 12.8 oz (72.9 kg)  07/29/20 161 lb (73 kg)     Well developed, pleasant female, in good spirits, talkative. HEENT: PERRL, EOMI, conjunctiva clear. Wearing mask due to COVID-19 pandemic Neck: no lymphadenopathy, thyromegaly or carotid bruit Heart: regular rate and rhythm. No heart murmur Lungs: clear bilaterally Back: no CVA or spinal tenderness Abdomen: soft, nontender, no organomegaly or mass Extremities: No edema. 2+ pulses. Nontender at trochanteric bursa on R.  She has pain with pyriformis stretch. Skin: no rashes.  Normal turgor.  Neuro: alert and oriented. Normal gait. Psych: normal mood, affect, hygiene and grooming     Chemistry      Component Value Date/Time   NA 147 (H) 01/27/2021 0857   NA 141 11/11/2012  1020   K 4.2 01/27/2021 0857   K 4.1 11/11/2012 1020   CL 104 01/27/2021 0857   CO2 24 01/27/2021 0857   CO2 26 11/11/2012 1020   BUN 31 01/27/2021 0857   BUN 31.6 (H) 11/11/2012 1020   CREATININE 1.22 (H) 01/27/2021 0857   CREATININE 1.16 (H) 12/08/2016 0755   CREATININE 1.5 (H) 11/11/2012 1020      Component Value Date/Time   CALCIUM 9.6 01/27/2021 0857   CALCIUM 10.1 11/11/2012 1020   ALKPHOS 113 01/27/2021 0857   ALKPHOS 78 11/11/2012 1020   AST 20 01/27/2021 0857   AST 19 11/11/2012 1020   ALT 18 01/27/2021 0857   ALT 17 11/11/2012 1020   BILITOT 0.3 01/27/2021 0857   BILITOT 0.43 11/11/2012 1020       Fasting glucose 116   Lab Results  Component Value Date   HGBA1C 6.0 (H) 01/27/2021   Lab Results  Component Value Date   WBC 6.3 01/27/2021   HGB 12.8 01/27/2021   HCT 38.2 01/27/2021   MCV 93 01/27/2021   PLT 184 01/27/2021   Lab Results  Component Value Date   TSH 3.820 01/27/2021   Lab Results  Component Value Date   CHOL 155 01/27/2021   HDL 49 01/27/2021   LDLCALC 81 01/27/2021   TRIG 141 01/27/2021   CHOLHDL 3.2 01/27/2021   Lab Results  Component Value Date   LABURIC 4.9 01/27/2021      ASSESSMENT/PLAN:   Prediabetes - counseled re: proper diet, daily exercise, wt loss  Gout of big toe - continue allopurinol  Essential hypertension, benign - on diltiazem and HCTZ  Mixed hyperlipidemia - controlled, cont statin - Plan: pravastatin (PRAVACHOL) 40 MG tablet  Paroxysmal atrial fibrillation with rapid ventricular response (HCC) - on amiodarone and xarelto per Dr. Tamala Julian  Urge incontinence of urine - cont detrol  Anticoagulant long-term use - no evidence of bleeding or anemia  Need for influenza vaccination - Plan: Flu Vaccine QUAD High Dose(Fluad)  Right hip pain - Not related to arthritis, bursitis.  Shown pyriformis stretches.  f/u if persists/worsens  Flu shot given today.  Will get COVID vaccine from pharmacy in 2 weeks.  Send  labs to Dr. Tamala Julian  F/u CPE in 07/2021 as scheduled.  Labs prior--TSH, CBC, c-met, A1c

## 2021-02-11 NOTE — Telephone Encounter (Signed)
Pt informed

## 2021-02-12 ENCOUNTER — Encounter: Payer: Self-pay | Admitting: Family Medicine

## 2021-02-12 ENCOUNTER — Ambulatory Visit (INDEPENDENT_AMBULATORY_CARE_PROVIDER_SITE_OTHER): Payer: Medicare HMO | Admitting: Family Medicine

## 2021-02-12 ENCOUNTER — Other Ambulatory Visit: Payer: Self-pay

## 2021-02-12 VITALS — BP 132/64 | HR 72 | Ht 61.0 in | Wt 160.8 lb

## 2021-02-12 DIAGNOSIS — Z5181 Encounter for therapeutic drug level monitoring: Secondary | ICD-10-CM | POA: Diagnosis not present

## 2021-02-12 DIAGNOSIS — M109 Gout, unspecified: Secondary | ICD-10-CM

## 2021-02-12 DIAGNOSIS — M25551 Pain in right hip: Secondary | ICD-10-CM | POA: Diagnosis not present

## 2021-02-12 DIAGNOSIS — Z7901 Long term (current) use of anticoagulants: Secondary | ICD-10-CM | POA: Diagnosis not present

## 2021-02-12 DIAGNOSIS — E782 Mixed hyperlipidemia: Secondary | ICD-10-CM

## 2021-02-12 DIAGNOSIS — N3941 Urge incontinence: Secondary | ICD-10-CM | POA: Diagnosis not present

## 2021-02-12 DIAGNOSIS — I10 Essential (primary) hypertension: Secondary | ICD-10-CM | POA: Diagnosis not present

## 2021-02-12 DIAGNOSIS — I480 Paroxysmal atrial fibrillation: Secondary | ICD-10-CM

## 2021-02-12 DIAGNOSIS — Z23 Encounter for immunization: Secondary | ICD-10-CM

## 2021-02-12 DIAGNOSIS — R7303 Prediabetes: Secondary | ICD-10-CM | POA: Diagnosis not present

## 2021-02-12 MED ORDER — PRAVASTATIN SODIUM 40 MG PO TABS: 40.0000 mg | ORAL_TABLET | Freq: Every day | ORAL | 3 refills | Status: DC

## 2021-02-12 NOTE — Patient Instructions (Addendum)
You can consider using an antihistamine such as claritin to help dry up any mucus that is draining from the nose/throat. You can also try mucinex (guaifenesin) as an expectorant to thin out the mucus in the throat. Be sure to drink plenty of water.  Try applying heat to the buttock muscles, followed by the stretches shown (crossing the leg and pushing the knee down, to stretch the pyriformis)

## 2021-02-22 ENCOUNTER — Telehealth (INDEPENDENT_AMBULATORY_CARE_PROVIDER_SITE_OTHER): Payer: Medicare HMO | Admitting: Family Medicine

## 2021-02-22 DIAGNOSIS — U071 COVID-19: Secondary | ICD-10-CM

## 2021-02-22 MED ORDER — MOLNUPIRAVIR EUA 200MG CAPSULE: 4.0000 | ORAL_CAPSULE | Freq: Two times a day (BID) | ORAL | 0 refills | Status: AC

## 2021-02-22 NOTE — Progress Notes (Signed)
   Subjective:    Patient ID: Connie [name], female    DOB: 09/11/1927, 87 y.o.   MRN: [id number]  HPI Documentation for virtual audio telecommunications through [facility] encounter:  The patient was located at home. 2 patient identifiers used.  The provider was located in the office. The patient did consent to this visit and is aware of possible charges through their insurance for this visit.  The other persons participating in this telemedicine service were none. Time spent on call was 4 minutes and in review of previous records >15 minutes total for counseling and coordination of care.  This virtual service is not related to other E/M service within previous 7 days.  She called stating that she has been taking care of her her husband who has COVID.  She has been taking COVID testing fairly regularly but just in the last day she has noted increased difficulty with fatigue and a slight cough.  Her test this morning was positive.  No fever, chills, smell or taste change.  Review of Systems     Objective:   Physical Exam Alert and in no distress otherwise not examined       Assessment & Plan:  COVID-19 - Plan: molnupiravir EUA (LAGEVRIO) 200 mg CAPS capsule Due to her age and renal testing I decided to go with the above medication.  Recommend fluids, Tylenol and if symptoms worsen, she will call.

## 2021-02-28 DIAGNOSIS — Z0100 Encounter for examination of eyes and vision without abnormal findings: Secondary | ICD-10-CM | POA: Diagnosis not present

## 2021-03-17 ENCOUNTER — Other Ambulatory Visit: Payer: Self-pay | Admitting: Family Medicine

## 2021-03-26 ENCOUNTER — Other Ambulatory Visit: Payer: Self-pay | Admitting: Family Medicine

## 2021-04-10 ENCOUNTER — Ambulatory Visit: Payer: Medicare HMO | Admitting: Sports Medicine

## 2021-05-08 ENCOUNTER — Ambulatory Visit: Payer: Medicare HMO | Admitting: Sports Medicine

## 2021-05-15 ENCOUNTER — Other Ambulatory Visit: Payer: Self-pay

## 2021-05-15 ENCOUNTER — Ambulatory Visit: Payer: Medicare HMO | Admitting: Sports Medicine

## 2021-05-15 ENCOUNTER — Encounter: Payer: Self-pay | Admitting: Sports Medicine

## 2021-05-15 DIAGNOSIS — M79674 Pain in right toe(s): Secondary | ICD-10-CM

## 2021-05-15 DIAGNOSIS — L84 Corns and callosities: Secondary | ICD-10-CM

## 2021-05-15 DIAGNOSIS — M79671 Pain in right foot: Secondary | ICD-10-CM

## 2021-05-15 DIAGNOSIS — Z7901 Long term (current) use of anticoagulants: Secondary | ICD-10-CM

## 2021-05-15 DIAGNOSIS — M79672 Pain in left foot: Secondary | ICD-10-CM

## 2021-05-15 DIAGNOSIS — B351 Tinea unguium: Secondary | ICD-10-CM

## 2021-05-15 DIAGNOSIS — M79675 Pain in left toe(s): Secondary | ICD-10-CM

## 2021-05-15 NOTE — Progress Notes (Signed)
Subjective: Samaya Boardley is a 88 y.o. female patient seen today in office with complaint of painful thickened and elongated toenails and callus; unable to trim.  Reports that her nails are long and need to be trimmed and thinks that she has a new callus at the left second toe.  Patient denies changes with medical history; still on Xalerto for Afib like before with no changes.  No other issues noted.  Patient Active Problem List   Diagnosis Date Noted   Low back pain 04/11/2020   Mixed hyperlipidemia 04/11/2020   Onychomycosis 04/11/2020   Chronic anticoagulation 05/11/2016   Atrial fibrillation with rapid ventricular response ([place]) 06/24/2015   Gout 10/30/2013   Gout of big toe 10/30/2013   Osteoarthritis of multiple joints 10/30/2013   Breast cancer, right breast ([place]) 11/25/2012   Urge incontinence of urine 04/24/2011   Arthritis 04/24/2011   Essential hypertension, benign 10/23/2010   Pure hypercholesterolemia 10/23/2010    Current Outpatient Medications on File Prior to Visit  Medication Sig Dispense Refill   acetaminophen (TYLENOL) 650 MG CR tablet Take 1,300 mg by mouth 2 (two) times daily.     allopurinol (ZYLOPRIM) 100 MG tablet TAKE TWO TABLETS BY MOUTH DAILY 180 tablet 0   amiodarone (PACERONE) 200 MG tablet Take 0.5 tablets (100 mg total) by mouth daily. 45 tablet 3   calcium carbonate (TUMS - DOSED IN MG ELEMENTAL CALCIUM) 500 MG chewable tablet Chew 1 tablet by mouth daily as needed for indigestion or heartburn. Reported on 07/10/2015 (Patient not taking: Reported on 02/12/2021)     Calcium Carbonate-Vitamin D 600-400 MG-UNIT tablet Take 1 tablet by mouth daily.      colchicine 0.6 MG tablet Use as directed. Take two tablets by mouth at start of gout flare up. Then one hour later take one tablet by mouth. (Patient not taking: Reported on 02/12/2021)     diltiazem (CARDIZEM CD) 240 MG 24 hr capsule TAKE ONE CAPSULE BY MOUTH DAILY 90 capsule 3   hydrochlorothiazide  (MICROZIDE) 12.5 MG capsule TAKE ONE CAPSULE BY MOUTH DAILY 90 capsule 3   Multiple Vitamins-Minerals (CENTRUM SILVER PO) Take 1 tablet by mouth daily.     omeprazole (PRILOSEC) 20 MG capsule Take 1 capsule (20 mg total) by mouth daily. Reported on 05/23/2015 (Patient not taking: Reported on 02/12/2021)     pravastatin (PRAVACHOL) 40 MG tablet Take 1 tablet (40 mg total) by mouth daily. 90 tablet 3   Rivaroxaban (XARELTO) 15 MG TABS tablet TAKE ONE TABLET BY MOUTH DAILY WITH SUPPER 90 tablet 1   tolterodine (DETROL LA) 4 MG 24 hr capsule TAKE ONE CAPSULE BY MOUTH DAILY 90 capsule 0   No current facility-administered medications on file prior to visit.    Allergies  Allergen Reactions   Cephalexin Itching   Ciprofloxacin Itching   Diclofenac Nausea Only    Upset stomach   Shellfish-Derived Products Nausea And Vomiting   Tape Rash    Objective: Physical Exam  General: Well developed, nourished, no acute distress, awake, alert and oriented x 3  Vascular: Dorsalis pedis artery 1/4 bilateral, Posterior tibial artery 0/4 bilateral, skin temperature warm to warm proximal to distal bilateral lower extremities, trace edema at ankles bilateral, pedal hair present bilateral.  Neurological: Gross sensation present via light touch bilateral.   Dermatological: Skin is warm, dry, and supple bilateral, Nails 1-10 are tender, long, thick, and discolored with mild subungal debris, no webspace macerations present bilateral, no open lesions present bilateral, minimal  reactive keratosis medial aspect of right bunion greater than plantar left forefoot and a new keratotic lesion noted at the left second toe medial aspect from pressure from her bunion rubbing it. No signs of infection bilateral as previously noted.  Musculoskeletal: Asymptomatic bunion boney deformities noted bilateral. Muscular strength within normal limits without painon range of motion. No pain with calf compression bilateral.    Assessment and Plan:  Problem List Items Addressed This Visit   None Visit Diagnoses     Pain due to onychomycosis of toenails of both feet    -  Primary   Callus of foot       Anticoagulant long-term use       Foot pain, bilateral          -Examined patient.  -Re-Discussed continued care for painful mycotic toenails and callus -Mechanically debrided and reduced painful mycotic nails with sterile nail nipper and dremel nail file without incident and at no additional smooth callus plantar right foot using rotary bur without incident. -Dispensed toe cap for left second toe to prevent rubbing and callusing at the medial aspect of the toe from her bunion rubbing it -Patient to return in 2.5 months for follow up evaluation or sooner if symptoms worsen.  Landis Martins, DPM

## 2021-06-11 ENCOUNTER — Other Ambulatory Visit: Payer: Self-pay

## 2021-06-11 MED ORDER — DILTIAZEM HCL ER COATED BEADS 240 MG PO CP24: 240.0000 mg | ORAL_CAPSULE | Freq: Every day | ORAL | 1 refills | Status: DC

## 2021-06-15 ENCOUNTER — Other Ambulatory Visit: Payer: Self-pay | Admitting: Family Medicine

## 2021-06-23 ENCOUNTER — Other Ambulatory Visit: Payer: Self-pay | Admitting: Family Medicine

## 2021-06-24 ENCOUNTER — Telehealth: Payer: Self-pay

## 2021-06-24 NOTE — Telephone Encounter (Signed)
Completed P.A. TOLTERODINE over the phone t# [phone] approved til 05/03/2022 case# [id number].  Pt informed

## 2021-06-25 ENCOUNTER — Encounter (HOSPITAL_BASED_OUTPATIENT_CLINIC_OR_DEPARTMENT_OTHER): Payer: Self-pay

## 2021-06-25 ENCOUNTER — Emergency Department (HOSPITAL_BASED_OUTPATIENT_CLINIC_OR_DEPARTMENT_OTHER): Payer: Medicare HMO | Admitting: Radiology

## 2021-06-25 ENCOUNTER — Other Ambulatory Visit: Payer: Self-pay

## 2021-06-25 DIAGNOSIS — Z7901 Long term (current) use of anticoagulants: Secondary | ICD-10-CM | POA: Diagnosis not present

## 2021-06-25 DIAGNOSIS — R0789 Other chest pain: Secondary | ICD-10-CM | POA: Diagnosis not present

## 2021-06-25 DIAGNOSIS — Z853 Personal history of malignant neoplasm of breast: Secondary | ICD-10-CM | POA: Insufficient documentation

## 2021-06-25 DIAGNOSIS — R059 Cough, unspecified: Secondary | ICD-10-CM | POA: Diagnosis not present

## 2021-06-25 DIAGNOSIS — Z20822 Contact with and (suspected) exposure to covid-19: Secondary | ICD-10-CM | POA: Diagnosis not present

## 2021-06-25 DIAGNOSIS — I10 Essential (primary) hypertension: Secondary | ICD-10-CM | POA: Insufficient documentation

## 2021-06-25 LAB — URINALYSIS, ROUTINE W REFLEX MICROSCOPIC
Bilirubin Urine: NEGATIVE
Glucose, UA: NEGATIVE mg/dL
Ketones, ur: NEGATIVE mg/dL
Leukocytes,Ua: NEGATIVE
Nitrite: NEGATIVE
Protein, ur: NEGATIVE mg/dL
Specific Gravity, Urine: 1.025 (ref 1.005–1.030)
pH: 5.5 (ref 5.0–8.0)

## 2021-06-25 LAB — URINALYSIS, MICROSCOPIC (REFLEX): WBC, UA: NONE SEEN WBC/hpf (ref 0–5)

## 2021-06-25 LAB — RESP PANEL BY RT-PCR (FLU A&B, COVID) ARPGX2
Influenza A by PCR: NEGATIVE
Influenza B by PCR: NEGATIVE
SARS Coronavirus 2 by RT PCR: NEGATIVE

## 2021-06-25 NOTE — ED Triage Notes (Signed)
Patient here POV from Home with Back Pain.  Patient endorses Cough that has been present as well associated with Chills since yesterday PM. Congested.   Also endorses Pain to Right Lower and Mid Back.   No Known Fevers. No N/V/D.   NAD Noted during Triage. A&Ox4. GCS 15. Ambulatory.

## 2021-06-26 ENCOUNTER — Other Ambulatory Visit: Payer: Self-pay | Admitting: Interventional Cardiology

## 2021-06-26 ENCOUNTER — Emergency Department (HOSPITAL_BASED_OUTPATIENT_CLINIC_OR_DEPARTMENT_OTHER)
Admission: EM | Admit: 2021-06-26 | Discharge: 2021-06-26 | Disposition: A | Discharge status: Home / Self Care | Payer: Medicare HMO | Attending: Emergency Medicine | Admitting: Emergency Medicine

## 2021-06-26 DIAGNOSIS — R0789 Other chest pain: Secondary | ICD-10-CM

## 2021-06-26 LAB — COMPREHENSIVE METABOLIC PANEL
ALT: 13 U/L (ref 0–44)
AST: 17 U/L (ref 15–41)
Albumin: 3.9 g/dL (ref 3.5–5.0)
Alkaline Phosphatase: 70 U/L (ref 38–126)
Anion gap: 11 (ref 5–15)
BUN: 33 mg/dL — ABNORMAL HIGH (ref 8–23)
CO2: 26 mmol/L (ref 22–32)
Calcium: 9.6 mg/dL (ref 8.9–10.3)
Chloride: 99 mmol/L (ref 98–111)
Creatinine, Ser: 1.22 mg/dL — ABNORMAL HIGH (ref 0.44–1.00)
GFR, Estimated: 42 mL/min — ABNORMAL LOW (ref >60)
Glucose, Bld: 140 mg/dL — ABNORMAL HIGH (ref 70–99)
Potassium: 3.3 mmol/L — ABNORMAL LOW (ref 3.5–5.1)
Sodium: 136 mmol/L (ref 135–145)
Total Bilirubin: 0.5 mg/dL (ref 0.3–1.2)
Total Protein: 6.3 g/dL — ABNORMAL LOW (ref 6.5–8.1)

## 2021-06-26 LAB — CBC WITH DIFFERENTIAL/PLATELET
Abs Immature Granulocytes: 0.08 10*3/uL — ABNORMAL HIGH (ref 0.00–0.07)
Basophils Absolute: 0 10*3/uL (ref 0.0–0.1)
Basophils Relative: 0 %
Eosinophils Absolute: 0 10*3/uL (ref 0.0–0.5)
Eosinophils Relative: 0 %
HCT: 36.3 % (ref 36.0–46.0)
Hemoglobin: 12 g/dL (ref 12.0–15.0)
Immature Granulocytes: 0 %
Lymphocytes Relative: 8 %
Lymphs Abs: 1.4 10*3/uL (ref 0.7–4.0)
MCH: 30.7 pg (ref 26.0–34.0)
MCHC: 33.1 g/dL (ref 30.0–36.0)
MCV: 92.8 fL (ref 80.0–100.0)
Monocytes Absolute: 1.1 10*3/uL — ABNORMAL HIGH (ref 0.1–1.0)
Monocytes Relative: 6 %
Neutro Abs: 15.2 10*3/uL — ABNORMAL HIGH (ref 1.7–7.7)
Neutrophils Relative %: 86 %
Platelets: 175 10*3/uL (ref 150–400)
RBC: 3.91 MIL/uL (ref 3.87–5.11)
RDW: 14 % (ref 11.5–15.5)
WBC: 17.9 10*3/uL — ABNORMAL HIGH (ref 4.0–10.5)
nRBC: 0 % (ref 0.0–0.2)

## 2021-06-26 LAB — TROPONIN I (HIGH SENSITIVITY)
Troponin I (High Sensitivity): 10 ng/L (ref <18)
Troponin I (High Sensitivity): 10 ng/L (ref <18)

## 2021-06-26 MED ORDER — DOXYCYCLINE HYCLATE 100 MG PO CAPS: 100.0000 mg | ORAL_CAPSULE | Freq: Two times a day (BID) | ORAL | 0 refills | Status: AC

## 2021-06-26 MED ORDER — DICLOFENAC SODIUM 1 % EX GEL: 2.0000 g | Freq: Four times a day (QID) | CUTANEOUS | 0 refills | Status: DC

## 2021-06-26 MED ORDER — DOXYCYCLINE HYCLATE 100 MG PO TABS
100.0000 mg | ORAL_TABLET | Freq: Once | ORAL | Status: AC
  Administered 2021-06-26: 100 mg via ORAL
  Filled 2021-06-26: qty 1

## 2021-06-26 NOTE — ED Provider Notes (Signed)
Emergency Department Provider Note   I have reviewed the triage vital signs and the nursing notes.   HISTORY  Chief Complaint Back Pain and Cough   HPI Connie [name] is a 88 y.o. female presents to the ED with right lateral chest wall pain radiating to the right anterior chest and arm. Symptoms were severe earlier but have improved. No longer having arm pain or symptoms. Pain is worse with movement and sharp. She is anticoagulated and compliant with medication. No vomiting. No fever. No rash.    Past Medical History:  Diagnosis Date   Abnormal brain MRI 8/01   small hemorrhagic stroke and possible cavernous hemangioma (Dr. Erling Cruz)   Adenomatous colon polyp 1/03   Arthritis    OA spine, hands   Breast cancer ([place]) 2001   R breast (T3N1, ER/PR+, HER-2 +) s/p mastectomy, chemo and chest wall irradiation (Dr. Truddie Coco)   DDD (degenerative disc disease), lumbar    GERD (gastroesophageal reflux disease)    Hearing loss 2012   wears hearing aids   Herpes zoster 2003   Hyperlipidemia    Hypertension    Onychomycosis 2003, 2008   treated with Lamisil   Osteopenia    (DEXA's done by Dr. Truddie Coco); prev took Fosamax.  No change in DEXA after off Fosamax x 2 years   Ovarian cyst 12/2008   right   Personal history of chemotherapy    Personal history of radiation therapy    Postmenopausal bleeding 2000   benign EMB (Dr. Raphael Gibney)   Urge urinary incontinence     Review of Systems  Constitutional: No fever/chills Eyes: No visual changes. ENT: No sore throat. Cardiovascular: Positive chest pain. Respiratory: Denies shortness of breath. Gastrointestinal: No abdominal pain.  No nausea, no vomiting.  No diarrhea.  No constipation. Genitourinary: Negative for dysuria. Musculoskeletal: Negative for back pain. Skin: Negative for rash. Neurological: Negative for headaches, focal weakness or numbness.   ____________________________________________   PHYSICAL EXAM:  VITAL  SIGNS: ED Triage Vitals  Enc Vitals Group     BP 06/25/21 2016 (!) 179/97     Pulse Rate 06/25/21 2016 88     Resp 06/25/21 2016 18     Temp 06/25/21 2016 99.3 F (37.4 C)     Temp src --      SpO2 06/25/21 2016 98 %     Weight 06/25/21 2051 160 lb 11.5 oz (72.9 kg)     Height 06/25/21 2051 '5\' 1"'  (1.549 m)    Constitutional: Alert and oriented. Well appearing and in no acute distress. Eyes: Conjunctivae are normal.  Head: Atraumatic. Nose: No congestion/rhinnorhea. Mouth/Throat: Mucous membranes are moist.  Neck: No stridor.   Cardiovascular: Normal rate, regular rhythm. Good peripheral circulation. Grossly normal heart sounds.   Respiratory: Normal respiratory effort.  No retractions. Lungs CTAB. Gastrointestinal: Soft and nontender. No distention.  Musculoskeletal: No lower extremity tenderness nor edema. No gross deformities of extremities. Point tenderness along the right lateral rib. Patient winces in pain. No rash.  Neurologic:  Normal speech and language. No gross focal neurologic deficits are appreciated.  Skin:  Skin is warm, dry and intact. No rash noted.  ____________________________________________   LABS (all labs ordered are listed, but only abnormal results are displayed)  Labs Reviewed  URINALYSIS, ROUTINE W REFLEX MICROSCOPIC - Abnormal; Notable for the following components:      Result Value   Hgb urine dipstick TRACE (*)    All other components within normal limits  URINALYSIS,  MICROSCOPIC (REFLEX) - Abnormal; Notable for the following components:   Bacteria, UA RARE (*)    All other components within normal limits  COMPREHENSIVE METABOLIC PANEL - Abnormal; Notable for the following components:   Potassium 3.3 (*)    Glucose, Bld 140 (*)    BUN 33 (*)    Creatinine, Ser 1.22 (*)    Total Protein 6.3 (*)    GFR, Estimated 42 (*)    All other components within normal limits  CBC WITH DIFFERENTIAL/PLATELET - Abnormal; Notable for the following  components:   WBC 17.9 (*)    Neutro Abs 15.2 (*)    Monocytes Absolute 1.1 (*)    Abs Immature Granulocytes 0.08 (*)    All other components within normal limits  RESP PANEL BY RT-PCR (FLU A&B, COVID) ARPGX2  TROPONIN I (HIGH SENSITIVITY)  TROPONIN I (HIGH SENSITIVITY)   ____________________________________________  EKG  Rate: 77 PR: 201 QTc: 485  Sinus rhythm. Non-specific ST changes and repolarization abnormality. No STEMI.  ____________________________________________  RADIOLOGY  No results found.  ____________________________________________   PROCEDURES  Procedure(s) performed:   Procedures  None  ____________________________________________   INITIAL IMPRESSION / ASSESSMENT AND PLAN / ED COURSE  Pertinent labs & imaging results that were available during my care of the patient were reviewed by me and considered in my medical decision making (see chart for details).   This patient is Presenting for Evaluation of atypical CP, which does require a range of treatment options, and is a complaint that involves a high risk of morbidity and mortality.  The Differential Diagnoses includes all life-threatening causes for chest pain. This includes but is not exclusive to acute coronary syndrome, aortic dissection, pulmonary embolism, cardiac tamponade, community-acquired pneumonia, pericarditis, musculoskeletal chest wall pain, etc.   Critical Interventions- PO abx   Medications  doxycycline (VIBRA-TABS) tablet 100 mg (100 mg Oral Given 06/26/21 0249)    Reassessment after intervention:  Symptoms not worsening.    I did obtain Additional Historical Information from family at bedside.   Clinical Laboratory Tests Ordered, included Normal UA and negative for COVID and Flu.   Radiologic Tests Ordered, included CXR. I independently interpreted the images and agree with radiology interpretation.   Cardiac Monitor Tracing which shows NSR.   Social Determinants of  Health Risk patient is a nonsmoker.   Medical Decision Making: Summary:  Patient presents to the emergency department with atypical chest pain which is easily reproducible on physical exam.  My suspicion overall for PE or ACS is very low however given the patient's age I do plan for screening troponin x1 along with chemistry and CBC.  X-ray does not show any infiltrate, broken rib.  There is no rash at the area of pain to suggest developing zoster although did discuss this possibility with patient.  Pain is much improved at this time.  No rib fracture seen on x-ray.  Low suspicion for fracture clinically but MSK related pain is higher on my differential based on exam and recent cough history.   Reevaluation with update and discussion with patient and family at bedside. Plan for supportive care and abx treating presumed bronchitis. Discussed plan for PCP follow up within the week.   Disposition: discarhge  ____________________________________________  FINAL CLINICAL IMPRESSION(S) / ED DIAGNOSES  Final diagnoses:  Atypical chest pain     NEW OUTPATIENT MEDICATIONS STARTED DURING THIS VISIT:  Discharge Medication List as of 06/26/2021  3:20 AM     START taking these medications  Details  diclofenac Sodium (VOLTAREN) 1 % GEL Apply 2 g topically 4 (four) times daily., Starting Thu 06/26/2021, Normal    doxycycline (VIBRAMYCIN) 100 MG capsule Take 1 capsule (100 mg total) by mouth 2 (two) times daily for 7 days., Starting Thu 06/26/2021, Until Thu 07/03/2021, Normal        Note:  This document was prepared using Dragon voice recognition software and may include unintentional dictation errors.  Nanda Quinton, MD, [facility] Emergency Medicine    Alcide Memoli, [facility], MD 06/29/21 [phone]

## 2021-06-26 NOTE — Discharge Instructions (Signed)
You are seen in the emergency room today with chest wall pain.  I suspect this is musculoskeletal pain caused by vigorous coughing.  Your white blood cell count is high and I am starting on antibiotics to cover for respiratory infections.  Please follow closely with your primary care doctor in the coming week.  If you develop any new or suddenly worsening symptoms please return to the emergency department for reevaluation.

## 2021-06-26 NOTE — Telephone Encounter (Signed)
Prescription refill request for Xarelto received.  Indication: Afib  Last office visit: 11/19/20 Tamala Julian)  Weight: 72.9kg Age: 88 Scr: 1.22 (06/26/21)  CrCl: 34.49ml/min  Appropriate dose and refill sent to requested pharmacy.

## 2021-07-01 ENCOUNTER — Ambulatory Visit (INDEPENDENT_AMBULATORY_CARE_PROVIDER_SITE_OTHER): Payer: Medicare HMO | Admitting: Medical

## 2021-07-01 ENCOUNTER — Other Ambulatory Visit: Payer: Self-pay

## 2021-07-01 VITALS — BP 140/68 | HR 71 | Wt 153.4 lb

## 2021-07-01 DIAGNOSIS — R091 Pleurisy: Secondary | ICD-10-CM | POA: Diagnosis not present

## 2021-07-01 DIAGNOSIS — R0789 Other chest pain: Secondary | ICD-10-CM

## 2021-07-01 DIAGNOSIS — E876 Hypokalemia: Secondary | ICD-10-CM | POA: Diagnosis not present

## 2021-07-01 DIAGNOSIS — D72829 Elevated white blood cell count, unspecified: Secondary | ICD-10-CM | POA: Diagnosis not present

## 2021-07-01 DIAGNOSIS — R052 Subacute cough: Secondary | ICD-10-CM | POA: Diagnosis not present

## 2021-07-01 NOTE — Progress Notes (Signed)
Subjective:  Connie [name] is a 88 y.o. female who presents for Chief Complaint  Patient presents with   Hospitalization Follow-up    ER follow-up on pneumonia      Here with husband for emergency dept follow up.   Was seen in the emergency dept 5 days ago at the [facility] location.  She notes she was having pains in mid chest /lateral chest, some cough, back pain.   Was evaluated in the emergency department.  Was put on antibiotic doxycycline.  She has 2 more days left on this  Today energy seems decreased, but no longer has chest pains.  Was having some cough.   Was getting up dark thick mucous.  Now some sinus drainage.  Overall improving.  No fever, no body aches or chills.  No nausea vomiting or diarrhea.  No urinary tract symptoms.  No other aggravating or relieving factors.    No other c/o.  The following portions of the patient's history were reviewed and updated as appropriate: allergies, current medications, past family history, past medical history, past social history, past surgical history and problem list.  ROS Otherwise as in subjective above  Objective: BP 140/68    Pulse 71    Wt 153 lb 6.4 oz (69.6 kg)    SpO2 99%    BMI 28.98 kg/m   Wt Readings from Last 3 Encounters:  07/01/21 153 lb 6.4 oz (69.6 kg)  06/25/21 160 lb 11.5 oz (72.9 kg)  02/12/21 160 lb 12.8 oz (72.9 kg)   General appearance: alert, no distress, well developed, well nourished HEENT: normocephalic, sclerae anicteric, conjunctiva pink and moist, TMs pearly, nares patent, no discharge or erythema, pharynx normal Oral cavity: MMM, no lesions Neck: supple, no lymphadenopathy, no thyromegaly, no masses Heart: RRR, normal S1, S2, no murmurs Lungs: CTA bilaterally, no wheezes, rhonchi, or rales Abdomen: +bs, soft, non tender, non distended, no masses, no hepatomegaly, no splenomegaly Pulses: 2+ radial pulses, 2+ pedal pulses, normal cap refill Ext: no edema   Assessment: Encounter  Diagnoses  Name Primary?   Leukocytosis, unspecified type Yes   Low blood potassium    Chest wall pain    Pleurisy    Subacute cough      Plan: I reviewed the emergency department notes from her 06/26/2021 visit.  Chest x-ray was unremarkable.  Her labs did show some low potassium and elevated white cells.  Exam today unremarkable.  She has 2 more days left of doxycycline which she will finish out.  Continue to rest and hydrate.  Labs as below to recheck blood counts, kidney marker, and potassium  Connie [name] was seen today for hospitalization follow-up.  Diagnoses and all orders for this visit:  Leukocytosis, unspecified type -     CBC  Low blood potassium -     Basic metabolic panel  Chest wall pain  Pleurisy  Subacute cough   Follow up: pending labs

## 2021-07-02 LAB — BASIC METABOLIC PANEL
BUN/Creatinine Ratio: 25 (ref 12–28)
BUN: 30 mg/dL (ref 10–36)
CO2: 26 mmol/L (ref 20–29)
Calcium: 10 mg/dL (ref 8.7–10.3)
Chloride: 101 mmol/L (ref 96–106)
Creatinine, Ser: 1.21 mg/dL — ABNORMAL HIGH (ref 0.57–1.00)
Glucose: 87 mg/dL (ref 70–99)
Potassium: 3.5 mmol/L (ref 3.5–5.2)
Sodium: 142 mmol/L (ref 134–144)
eGFR: 42 mL/min/{1.73_m2} — ABNORMAL LOW (ref >59)

## 2021-07-02 LAB — CBC
Hematocrit: 39.8 % (ref 34.0–46.6)
Hemoglobin: 13.2 g/dL (ref 11.1–15.9)
MCH: 30.3 pg (ref 26.6–33.0)
MCHC: 33.2 g/dL (ref 31.5–35.7)
MCV: 92 fL (ref 79–97)
Platelets: 220 10*3/uL (ref 150–450)
RBC: 4.35 x10E6/uL (ref 3.77–5.28)
RDW: 12.9 % (ref 11.7–15.4)
WBC: 9.1 10*3/uL (ref 3.4–10.8)

## 2021-07-23 ENCOUNTER — Other Ambulatory Visit: Payer: Medicare HMO

## 2021-07-23 ENCOUNTER — Other Ambulatory Visit: Payer: Self-pay

## 2021-07-23 DIAGNOSIS — Z5181 Encounter for therapeutic drug level monitoring: Secondary | ICD-10-CM

## 2021-07-23 DIAGNOSIS — Z7901 Long term (current) use of anticoagulants: Secondary | ICD-10-CM

## 2021-07-23 DIAGNOSIS — R7303 Prediabetes: Secondary | ICD-10-CM | POA: Diagnosis not present

## 2021-07-24 ENCOUNTER — Ambulatory Visit: Payer: Medicare HMO | Admitting: Sports Medicine

## 2021-07-24 ENCOUNTER — Encounter: Payer: Self-pay | Admitting: Sports Medicine

## 2021-07-24 DIAGNOSIS — B351 Tinea unguium: Secondary | ICD-10-CM | POA: Diagnosis not present

## 2021-07-24 DIAGNOSIS — M79675 Pain in left toe(s): Secondary | ICD-10-CM | POA: Diagnosis not present

## 2021-07-24 DIAGNOSIS — Z7901 Long term (current) use of anticoagulants: Secondary | ICD-10-CM | POA: Diagnosis not present

## 2021-07-24 DIAGNOSIS — M79671 Pain in right foot: Secondary | ICD-10-CM

## 2021-07-24 DIAGNOSIS — M79674 Pain in right toe(s): Secondary | ICD-10-CM | POA: Diagnosis not present

## 2021-07-24 DIAGNOSIS — M79672 Pain in left foot: Secondary | ICD-10-CM

## 2021-07-24 DIAGNOSIS — L84 Corns and callosities: Secondary | ICD-10-CM

## 2021-07-24 LAB — COMPREHENSIVE METABOLIC PANEL
ALT: 12 IU/L (ref 0–32)
AST: 18 IU/L (ref 0–40)
Albumin/Globulin Ratio: 3.3 — ABNORMAL HIGH (ref 1.2–2.2)
Albumin: 4.3 g/dL (ref 3.5–4.6)
Alkaline Phosphatase: 90 IU/L (ref 44–121)
BUN/Creatinine Ratio: 25 (ref 12–28)
BUN: 32 mg/dL (ref 10–36)
Bilirubin Total: 0.4 mg/dL (ref 0.0–1.2)
CO2: 29 mmol/L (ref 20–29)
Calcium: 10.3 mg/dL (ref 8.7–10.3)
Chloride: 102 mmol/L (ref 96–106)
Creatinine, Ser: 1.3 mg/dL — ABNORMAL HIGH (ref 0.57–1.00)
Globulin, Total: 1.3 g/dL — ABNORMAL LOW (ref 1.5–4.5)
Glucose: 90 mg/dL (ref 70–99)
Potassium: 3.7 mmol/L (ref 3.5–5.2)
Sodium: 143 mmol/L (ref 134–144)
Total Protein: 5.6 g/dL — ABNORMAL LOW (ref 6.0–8.5)
eGFR: 39 mL/min/{1.73_m2} — ABNORMAL LOW (ref >59)

## 2021-07-24 LAB — CBC WITH DIFFERENTIAL/PLATELET
Basophils Absolute: 0.1 10*3/uL (ref 0.0–0.2)
Basos: 1 %
EOS (ABSOLUTE): 0.2 10*3/uL (ref 0.0–0.4)
Eos: 3 %
Hematocrit: 37.7 % (ref 34.0–46.6)
Hemoglobin: 13.1 g/dL (ref 11.1–15.9)
Immature Grans (Abs): 0 10*3/uL (ref 0.0–0.1)
Immature Granulocytes: 0 %
Lymphocytes Absolute: 2 10*3/uL (ref 0.7–3.1)
Lymphs: 31 %
MCH: 32.3 pg (ref 26.6–33.0)
MCHC: 34.7 g/dL (ref 31.5–35.7)
MCV: 93 fL (ref 79–97)
Monocytes Absolute: 0.5 10*3/uL (ref 0.1–0.9)
Monocytes: 8 %
Neutrophils Absolute: 3.7 10*3/uL (ref 1.4–7.0)
Neutrophils: 57 %
Platelets: 192 10*3/uL (ref 150–450)
RBC: 4.05 x10E6/uL (ref 3.77–5.28)
RDW: 13.1 % (ref 11.7–15.4)
WBC: 6.5 10*3/uL (ref 3.4–10.8)

## 2021-07-24 LAB — TSH: TSH: 4.09 u[IU]/mL (ref 0.450–4.500)

## 2021-07-24 LAB — HEMOGLOBIN A1C
Est. average glucose Bld gHb Est-mCnc: 123 mg/dL
Hgb A1c MFr Bld: 5.9 % — ABNORMAL HIGH (ref 4.8–5.6)

## 2021-07-24 NOTE — Progress Notes (Signed)
Subjective: ?Connie [name] is a 88 y.o. female patient seen today in office with complaint of painful thickened and elongated toenails and callus; unable to trim.  Reports that her left second toe is still rubbing, toe cap she got is not helping.  Patient denies changes with medical history; still on Xalerto for Afib like before with no changes.  ? ?Reports her husband is still in hospital.  ? ?No other issues noted. ? ?Patient Active Problem List  ? Diagnosis Date Noted  ? Low back pain 04/11/2020  ? Mixed hyperlipidemia 04/11/2020  ? Onychomycosis 04/11/2020  ? Chronic anticoagulation 05/11/2016  ? Atrial fibrillation with rapid ventricular response ([place]) 06/24/2015  ? Gout 10/30/2013  ? Gout of big toe 10/30/2013  ? Osteoarthritis of multiple joints 10/30/2013  ? Breast cancer, right breast ([place]) 11/25/2012  ? Urge incontinence of urine 04/24/2011  ? Arthritis 04/24/2011  ? Essential hypertension, benign 10/23/2010  ? Pure hypercholesterolemia 10/23/2010  ? ? ?Current Outpatient Medications on File Prior to Visit  ?Medication Sig Dispense Refill  ? acetaminophen (TYLENOL) 650 MG CR tablet Take 1,300 mg by mouth 2 (two) times daily.    ? allopurinol (ZYLOPRIM) 100 MG tablet TAKE TWO TABLETS BY MOUTH DAILY 180 tablet 0  ? amiodarone (PACERONE) 200 MG tablet Take 0.5 tablets (100 mg total) by mouth daily. 45 tablet 3  ? calcium carbonate (TUMS - DOSED IN MG ELEMENTAL CALCIUM) 500 MG chewable tablet Chew 1 tablet by mouth daily as needed for indigestion or heartburn. Reported on 07/10/2015    ? Calcium Carbonate-Vitamin D 600-400 MG-UNIT tablet Take 1 tablet by mouth daily.     ? colchicine 0.6 MG tablet Use as directed. Take two tablets by mouth at start of gout flare up. Then one hour later take one tablet by mouth.    ? diclofenac Sodium (VOLTAREN) 1 % GEL Apply 2 g topically 4 (four) times daily. 100 g 0  ? diltiazem (CARDIZEM CD) 240 MG 24 hr capsule Take 1 capsule (240 mg total) by mouth daily. 90  capsule 1  ? hydrochlorothiazide (MICROZIDE) 12.5 MG capsule TAKE ONE CAPSULE BY MOUTH DAILY 90 capsule 3  ? Multiple Vitamins-Minerals (CENTRUM SILVER PO) Take 1 tablet by mouth daily.    ? omeprazole (PRILOSEC) 20 MG capsule Take 1 capsule (20 mg total) by mouth daily. Reported on 05/23/2015    ? pravastatin (PRAVACHOL) 40 MG tablet Take 1 tablet (40 mg total) by mouth daily. 90 tablet 3  ? tolterodine (DETROL LA) 4 MG 24 hr capsule TAKE ONE CAPSULE BY MOUTH DAILY 90 capsule 0  ? XARELTO 15 MG TABS tablet TAKE ONE TABLET BY MOUTH DAILY WITH SUPPER 90 tablet 1  ? ?No current facility-administered medications on file prior to visit.  ? ? ?Allergies  ?Allergen Reactions  ? Cephalexin Itching  ? Ciprofloxacin Itching  ? Diclofenac Nausea Only  ?  Upset stomach  ? Shellfish-Derived Products Nausea And Vomiting  ? Tape Rash  ? ? ?Objective: ?Physical Exam ? ?General: Well developed, nourished, no acute distress, awake, alert and oriented x 3 ? ?Vascular: Dorsalis pedis artery 1/4 bilateral, Posterior tibial artery 0/4 bilateral, skin temperature warm to warm proximal to distal bilateral lower extremities, trace edema at ankles bilateral, pedal hair present bilateral. ? ?Neurological: Gross sensation present via light touch bilateral.  ? ?Dermatological: Skin is warm, dry, and supple bilateral, Nails 1-10 are tender, long, thick, and discolored with mild subungal debris, no webspace macerations present bilateral, no  open lesions present bilateral, minimal reactive keratosis medial aspect of right bunion greater than plantar left forefoot. No signs of infection bilateral as previously noted. ? ?Musculoskeletal: Asymptomatic bunion and elevated 2nd toe on left boney deformities noted bilateral. Muscular strength within normal limits without painon range of motion. No pain with calf compression bilateral.  ? ?Assessment and Plan:  ?Problem List Items Addressed This Visit   ?None ?Visit Diagnoses   ? ? Pain due to  onychomycosis of toenails of both feet    -  Primary  ? Callus of foot      ? Anticoagulant long-term use      ? Foot pain, bilateral      ? ?  ? ?-Examined patient.  ?-Re-Discussed continued care for painful mycotic toenails and callus ?-Mechanically debrided and reduced painful mycotic nails with sterile nail nipper and dremel nail file without incident and at no additional smooth callus plantar right foot using rotary bur without incident. ?-Dispensed 2nd hammertoe regulator for patient to use on left foot as directed  ?-Patient to return in 2.5 months for follow up evaluation or sooner if symptoms worsen. ? ?Landis Martins, DPM ?   ?

## 2021-07-29 NOTE — Patient Instructions (Addendum)
?HEALTH MAINTENANCE RECOMMENDATIONS: ? ?It is recommended that you get at least 30 minutes of aerobic exercise at least 5 days/week (for weight loss, you may need as much as 60-90 minutes). This can be any activity that gets your heart rate up. This can be divided in 10-15 minute intervals if needed, but try and build up your endurance at least once a week.  Weight bearing exercise is also recommended twice weekly. ? ?Eat a healthy diet with lots of vegetables, fruits and fiber.  "Colorful" foods have a lot of vitamins (ie green vegetables, tomatoes, red peppers, etc).  Limit sweet tea, regular sodas and alcoholic beverages, all of which has a lot of calories and sugar.  Up to 1 alcoholic drink daily may be beneficial for women (unless trying to lose weight, watch sugars).  Drink a lot of water. ? ?Calcium recommendations are 1200-1500 mg daily (1500 mg for postmenopausal women or women without ovaries), and vitamin D 1000 IU daily.  This should be obtained from diet and/or supplements (vitamins), and calcium should not be taken all at once, but in divided doses. ? ?Monthly self breast exams and yearly mammograms for women over the age of 11 is recommended. ? ?Sunscreen of at least SPF 30 should be used on all sun-exposed parts of the skin when outside between the hours of 10 am and 4 pm (not just when at beach or pool, but even with exercise, golf, tennis, and yard work!)  Use a sunscreen that says "broad spectrum" so it covers both UVA and UVB rays, and make sure to reapply every 1-2 hours. ? ?Remember to change the batteries in your smoke detectors when changing your clock times in the spring and fall. Carbon monoxide detectors are recommended for your home. ? ?Use your seat belt every time you are in a car, and please drive safely and not be distracted with cell phones and texting while driving. ? ? ?Connie [name] , ?Thank you for taking time to come for your Medicare Wellness Visit. I appreciate your ongoing  commitment to your health goals. Please review the following plan we discussed and let me know if I can assist you in the future.  ? ?This is a list of the screening recommended for you and due dates:  ?Health Maintenance  ?Topic Date Due  ? COVID-19 Vaccine (4 - Booster for Moderna series) 05/07/2020  ? Tetanus Vaccine  12/05/2024  ? Pneumonia Vaccine  Completed  ? Flu Shot  Completed  ? DEXA scan (bone density measurement)  Completed  ? Zoster (Shingles) Vaccine  Completed  ? HPV Vaccine  Aged Out  ? ?We discussed yearly mammograms, and bone density scan. I think at some point we should repeat a bone density, but this can wait a little longer.  Be sure to get weight-bearing exercise at least 2x/week, and ensure you are getting adequate calcium and vitamin D daily in order to keep your bones strong. ? ?[facility] to schedule your yearly mammogram. ? ?Your COVID vaccines are all up to date (we will be entering the dates we got from your card today, so these will be in your chart, but weren't when this was printed). ? ?Continue to try and keep the area (vaginal/rectal) dry as best as you can by changing wet pads frequently.  Pat dry. ?Consider using zinc oxide (ie Desitin) as a barrier.  This should help protect the skin from getting so raw and allowing it to heal. ? ?  If you have worsening pain, itching, bleeding, then please return for re-evaluation. ? ?

## 2021-07-29 NOTE — Progress Notes (Addendum)
Chief Complaint  Patient presents with   Medicare Wellness    Nonfasting AWV/CPE. Would like you to check her ears to see if they need cleaned out. Has been very fatigued since she was sick in February. Her back also has also been hurting for several month and her right leg isn't working so well. (Wants to know is you would rx a muscle relaxant?)   Connie [name] is a 88 y.o. female who presents for annual physical exam, Medicare wellness visit and follow-up on chronic medical conditions.  She had labs checked prior to visit, see results below.  She has the following concerns:  She was told she had wax in her ears when she went for her hearing aide check, and needs them cleaned out.  She had hearing aids turned up recently, working well.  She had respiratory illness (pneumonia vs bronchitis) last month, which has completely resolved. She has ongoing fatigue, but no cough or shortness of breath. The chest wall pain she had has resolved.  She is complaining of some low back pain (across the whole back). She has some pain and stiffness in the RLE, trouble lifting her leg to get in and out of the car.  Moving it helps it feel better.  She denies any numbness or weakness. She has noticed occasionally catching a toe on things with her right foot. She hasn't had any falls related to the toe catching. She previously had physical therapy at [facility], which was effective (when she had LBP and LEFT sided leg issues in the past).  She takes Tylenol regularly, but is noticing the pain more frequently despite this. She denies any dysuria, or any odor that stays (just occasionally).  XR 05/2020: IMPRESSION: 1. No acute osseous abnormality. 2. Advanced degenerative disc disease at L4-L5 and L5-S1.  Atrial fibrillation:  She last saw Dr. Tamala Julian (cardiologist) in July, rhythm controlled with amiodarone. She continues on diltiazem 256m and Xarelto. She denies any significant bleeding.  Pt  has HTN, and is on diltiazem 240 and HCTZ. She hasn't checked BP in the last 3 weeks (husband has been ill, hospitalized, home now). Prior to that had been normal, recalls 130's/65.    Gout:  She continues to do well with 2092mof allopurinol, without any gout flares (great toe).  She uses Tylenol Arthritis 2 BID, which controls her pain (feet, hands, back). Lab Results  Component Value Date   LABURIC 4.9 01/27/2021     Hyperlipidemia:  Compliant with 4054mf pravastatin without side effects.  She hasn't tolerated fish oil in the past (due to belching).  She hasn't been on either Lovaza (insurance issue) or Vascepa (did not tolerate due to acid reflux, even just 1 capsule daily) and at last check her TG were improved/normal. Normal diet is occasional fried fish and desserts 2x/week (not usually facility's desserts, but will get [employer] ice cream). Aortic atherosclerosis was noted (calcifications) on CXR 06/2021.  Lab Results  Component Value Date   CHOL 155 01/27/2021   HDL 49 01/27/2021   LDLCALC 81 01/27/2021   TRIG 141 01/27/2021   CHOLHDL 3.2 01/27/2021    Urge urinary incontinence--Taking Detrol LA 4mg12mMost of the time it works well. Occasionally has a night (about once a month) where she is up every 2 hours at night (no frequency during the day).  She usually gets up just 2-3x/night. Humidifier helps with dry mouth. Denies any dysuria, odor or hematuria. She wears Poise pads daily,  due to the urgency and incontinence if she is not close enough to the bathroom.. She does report some skin irritation in the vaginal/perineal area that she would like looked at today.   GERD--She has reflux about once or twice a month, and will take either Prilosec or Tums, or both.  She hasn't needed either of these in a long time. She doesn't eat spicy food or eat late (eats 5pm). Denies dysphagia.    Pre-diabetes--She admits she enjoys good breads and ice cream. She tries to limit her portions.  Hasn't  been having open-faced sandwiches, but plans to, in order to cut down on the carbs.   Immunization History  Administered Date(s) Administered   Fluad Quad(high Dose 65+) 01/04/2019, 01/11/2020, 02/12/2021   Hepatitis A 11/06/1995, 08/25/1996   IPV 11/06/1995   Influenza Split 03/12/2009, 02/02/2011, 02/02/2012, 01/19/2013   Influenza, High Dose Seasonal PF 02/24/2016, 02/01/2017, 02/17/2018   Influenza-Unspecified 01/16/2014, 02/02/2015   Meningococcal Polysaccharide 11/06/1995   Moderna Covid-19 Vaccine Bivalent Booster 87yrs & up 06/14/2021   Moderna Sars-Covid-2 Vaccination 05/08/2019, 06/05/2019, 03/12/2020, 09/25/2020   Pneumococcal Conjugate-13 05/01/2013   Pneumococcal Polysaccharide-23 11/06/1995, 06/04/2005, 06/15/2016   Tdap 11/06/1995, 06/04/2005, 06/16/2013, 12/06/2014   Typhoid Live 11/15/1995, 04/29/2010, 06/10/2015   Yellow Fever 04/04/2012   Zoster Recombinat (Shingrix) 08/20/2017, 11/18/2017   Zoster, Live 06/04/2005   She had COVID-19 in 02/2021 Last Pap smear: 6/09   Last mammogram: 03/2019 Last colonoscopy: 12/2008   Last DEXA: 09/2015  T-1.1 L fem neck Dentist: regular, 1-2 times/year (every 9 months) Ophtho: yearly  Exercise: Walks 1/2 mile daily (to/from meals.)  Not walking much, as husband slowed her down.  She sometimes will let him leave for the meal before her, and then she walks faster.  Patient Care Team: Rita Ohara, MD as PCP - General (Family Medicine) Belva Crome, MD as PCP - Cardiology (Cardiology) Ophtho: Dr. [place] Dentist: Dr. Altamese Sulligent Oncologist: Dr. Humphrey Rolls (she has been released from her care) Derm: Dr. Derrel Nip GI: Dr. Wynetta Emery Podiatrist: Dr. Cannon Kettle Cardiologists: Dr. Tamala Julian, Dr. Rayann Heman  Depression Screening: Flowsheet Row Office Visit from 07/30/2021 in [place]  PHQ-2 Total Score 0        Falls screen:     07/30/2021    1:48 PM 02/12/2021    2:40 PM 07/29/2020    2:47 PM 07/12/2019    1:31 PM 07/04/2018    2:34 PM   [place] in the past year? 1 0 0 1 0  Number falls in past yr: 1 0  0   Comment Oct 2022 x 2      Injury with Fall? 1 0  1   Comment bruising   at [facility] in January and injured her lower legs/contusions and bruising   Risk for fall due to : History of fall(s) No Fall Risks     Follow up Falls evaluation completed Falls evaluation completed       [place] when she was sick with COVID ("a little groggy all the time"). None since then.  Functional Status Survey: Is the patient deaf or have difficulty hearing?: Yes (B/L hearing aides, just turned up) Does the patient have difficulty seeing, even when wearing glasses/contacts?: Yes (needs glasses for reading) Does the patient have difficulty concentrating, remembering, or making decisions?: No Does the patient have difficulty walking or climbing stairs?: Yes (having trouble with right hip/leg) Does the patient have difficulty dressing or bathing?: No Does the patient have difficulty doing errands  alone such as visiting a doctor's office or shopping?: No  Mini-Cog Scoring: 5     End of Life Discussion:  Patient has a living will and medical power of attorney. We started discussion of advanced care planning at prior visit, given blank DNR and MOST forms.  She wanted to discuss with her daughter, Hinton Dyer, who is a Engineer, drilling.  She hasn't done this yet, but thinks she is ready for DNR.   PMH, PSH, SH and FH were reviewed and updated  Outpatient Encounter Medications as of 07/30/2021  Medication Sig Note   acetaminophen (TYLENOL) 650 MG CR tablet Take 1,300 mg by mouth 2 (two) times daily.    allopurinol (ZYLOPRIM) 100 MG tablet TAKE TWO TABLETS BY MOUTH DAILY    amiodarone (PACERONE) 200 MG tablet Take 0.5 tablets (100 mg total) by mouth daily.    calcium carbonate (TUMS - DOSED IN MG ELEMENTAL CALCIUM) 500 MG chewable tablet Chew 1 tablet by mouth daily as needed for indigestion or heartburn. Reported on 07/10/2015    Calcium Carbonate-Vitamin  D 600-400 MG-UNIT tablet Take 1 tablet by mouth daily.     diltiazem (CARDIZEM CD) 240 MG 24 hr capsule Take 1 capsule (240 mg total) by mouth daily.    hydrochlorothiazide (MICROZIDE) 12.5 MG capsule TAKE ONE CAPSULE BY MOUTH DAILY    Multiple Vitamins-Minerals (CENTRUM SILVER PO) Take 1 tablet by mouth daily.    pravastatin (PRAVACHOL) 40 MG tablet Take 1 tablet (40 mg total) by mouth daily.    tolterodine (DETROL LA) 4 MG 24 hr capsule TAKE ONE CAPSULE BY MOUTH DAILY    XARELTO 15 MG TABS tablet TAKE ONE TABLET BY MOUTH DAILY WITH SUPPER    colchicine 0.6 MG tablet Use as directed. Take two tablets by mouth at start of gout flare up. Then one hour later take one tablet by mouth. (Patient not taking: Reported on 07/30/2021) 07/30/2021: prn   omeprazole (PRILOSEC) 20 MG capsule Take 1 capsule (20 mg total) by mouth daily. Reported on 05/23/2015 (Patient not taking: Reported on 07/30/2021) 07/30/2021: prn   [DISCONTINUED] diclofenac Sodium (VOLTAREN) 1 % GEL Apply 2 g topically 4 (four) times daily.    No facility-administered encounter medications on file as of 07/30/2021.   Allergies  Allergen Reactions   Cephalexin Itching   Ciprofloxacin Itching   Diclofenac Nausea Only    Upset stomach   Shellfish-Derived Products Nausea And Vomiting   Tape Rash    ROS: The patient denies anorexia, fever, weight changes, headaches, vision changes, ear pain, sore throat, breast concerns, chest pain, dizziness, syncope, dyspnea on exertion, cough, swelling, nausea, vomiting, diarrhea, melena, hematochezia, indigestion/heartburn (infrequent, per HPI), hematuria, dysuria, vaginal bleeding, discharge, odor or itch, genital lesions, weakness, tremor, suspicious skin lesions, depression, anxiety, abnormal bleeding/bruising, or enlarged lymph nodes.   +urge urinary incontinence (see HPI). +joint pains in hands/fingers/feet--treated with tylenol, per HPI +hearing loss--has hearing aids, working well, recently  adjusted. Some wax noted recently. Bowels are better. Eats a small box of raisins if she gets constipated, with good results. No longer needs miralax or metamucil. Some low back pain and RLE issues per HPI.   PHYSICAL EXAM:  BP 120/60   Pulse 68   Ht 5' (1.524 m)   Wt 153 lb 12.8 oz (69.8 kg)   BMI 30.04 kg/m    Wt Readings from Last 3 Encounters:  07/30/21 153 lb 12.8 oz (69.8 kg)  07/01/21 153 lb 6.4 oz (69.6 kg)  06/25/21 160 lb  11.5 oz (72.9 kg)  07/2020 weight 161#   General Appearance:    Alert, cooperative, no distress, appears stated age     Head:    Normocephalic, without obvious abnormality, atraumatic     Eyes:    PERRL, conjunctiva/corneas clear, EOM's intact, fundi benign     Ears:    Normal external ears. Normal TM's and EACs. Nonocclusive cerumen is noted bilaterally, more on the left. After lavage, TM's are completely normal, normal EAC.  Nose:    Normal, no drainage  Throat:    Clear, no erythema or lesions     Neck:    Supple, no lymphadenopathy; thyroid: no enlargement/ tenderness/nodules; no carotid bruit or JVD     Back:    No curvature, ROM normal, no CVA tenderness. Tender at upper lumbar spine, no muscle spasm. No SI tenderness  Lungs:    Clear to auscultation bilaterally without wheezes, rales or ronchi; respirations unlabored     Chest Wall:    No tenderness or deformity     Heart:    Regular rate and rhythm, S1 and S2 normal, no murmur, rub or gallop.   Breast Exam:    R breast absent, well healed surgical scars, nontender. Egg-sized soft tissue, cystic-swelling at proximal part of right pectoralis tendon (below proximal humerus), soft, mobile, nontender (approx 3cm)--this is just proximal to where her compression sleeve starts.  This is unchanged.  L breast normal-- no masses or nipple discharge. nipple is mildly inverted, chronic. No axillary lymphadenopathy     Abdomen:    Soft, non-tender, nondistended, normoactive bowel sounds, no masses, no  hepatosplenomegaly.   Genitalia:    Internal exam declined by patient. She was noted to have some erythema at labia majora/minora.  Erythema also at the perineum. No satellite pustules or skin breakdown.  Rectal:    Exam declined by patient   Extremities:    No clubbing, cyanosis or edema. Hyperpigmentation and scarring from prior injury at R shin  Pulses:    2+ and symmetric all extremities     Skin:    Skin color, texture, turgor normal.  Many angiomas on trunk   Lymph nodes:    Cervical, supraclavicular and axillary nodes normal     Neurologic:    Normal strength, sensation and gait; Negative SLR; Reflexes 2+ and symmetric throughout                         Psych:   Normal mood, affect, hygiene and grooming       Chemistry      Component Value Date/Time   NA 143 07/23/2021 1027   NA 141 11/11/2012 1020   K 3.7 07/23/2021 1027   K 4.1 11/11/2012 1020   CL 102 07/23/2021 1027   CO2 29 07/23/2021 1027   CO2 26 11/11/2012 1020   BUN 32 07/23/2021 1027   BUN 31.6 (H) 11/11/2012 1020   CREATININE 1.30 (H) 07/23/2021 1027   CREATININE 1.16 (H) 12/08/2016 0755   CREATININE 1.5 (H) 11/11/2012 1020      Component Value Date/Time   CALCIUM 10.3 07/23/2021 1027   CALCIUM 10.1 11/11/2012 1020   ALKPHOS 90 07/23/2021 1027   ALKPHOS 78 11/11/2012 1020   AST 18 07/23/2021 1027   AST 19 11/11/2012 1020   ALT 12 07/23/2021 1027   ALT 17 11/11/2012 1020   BILITOT 0.4 07/23/2021 1027   BILITOT 0.43 11/11/2012 1020     Fasting gluc  90  Lab Results  Component Value Date   HGBA1C 5.9 (H) 07/23/2021   Lab Results  Component Value Date   WBC 6.5 07/23/2021   HGB 13.1 07/23/2021   HCT 37.7 07/23/2021   MCV 93 07/23/2021   PLT 192 07/23/2021   Lab Results  Component Value Date   TSH 4.090 07/23/2021    ASSESSMENT/PLAN:  Annual physical exam  Medicare annual wellness visit, subsequent  Prediabetes - reviewed proper diet, weight loss, daily exercise - Plan: Hemoglobin  A1c  Gout of big toe - no flares, continue allopurinol - Plan: Uric acid  Essential hypertension, benign - well controlled - Plan: Comprehensive metabolic panel  Mixed hyperlipidemia - cont statin, lowfat, low cholesterol diet - Plan: Lipid panel  Paroxysmal atrial fibrillation with rapid ventricular response (HCC) - per cardiology. Denies symptoms.   Urge incontinence of urine - cont current med. Some irritation from pads.  Short term use of desitin. Change pad frequently. No sx of UTI  Medication monitoring encounter - Plan: CBC with Differential/Platelet, TSH, Uric acid, Comprehensive metabolic panel, Lipid panel  Personal history of breast cancer - past due for mammo, reminded to schedule (L breast)  Stage 3b chronic kidney disease ([place]) - encouraged to stay well hydrated, avoid NSAIDs - Plan: Comprehensive metabolic panel  Aortic atherosclerosis ([place]) - noted on CXR 06/2021. Cont statin  Advanced care planning/counseling discussion - Extensive conversation had.  Wants DNR.Took MOST home to d/w daughter Hinton Dyer, reviewed options in detail. Will bring back signed after d/w dtr  Chronic bilateral low back pain with right-sided sciatica - XR 05/2020 showed advanced degenerative disc disease at L4-L5 and L5-S1. Re-try PT (declines XR)  Bilateral hearing loss, unspecified hearing loss type - doing well with hearing aids, recently adjusted  Bilateral impacted cerumen - good results from ear lavage today  Prefers PT through [facility] (had it there in the past).   Discussed monthly self breast exams and yearly mammograms (past due, reminded to schedule); at least 30 minutes of aerobic activity at least 5 days/week; weight-bearing exercise at least 2x/wk; proper sunscreen use reviewed; healthy diet, including goals of calcium and vitamin D intake and alcohol recommendations (less than or equal to 1 drink/day) reviewed; regular seatbelt use; changing batteries in smoke detectors.  Immunization recommendations discussed--UTD, continue yearly high dose flu shots. Colonoscopy recommendations reviewed--last done 12/2008. F/u not needed due to age, unless problems.  Pap smear not indicated due to age and low risk.  Consider repeat DEXA in the future.   MOST form was reviewed. Changing to DNR, but wants to discuss with daughter Hinton Dyer the other specifics.  We discussed in detail.  She will take home form, and complete it and sign it after discussing with Hinton Dyer.  I will then sign and need to get the original back to her, to be put on her door of her apartment. (Copy for our records).  F/u 6 month med check with fasting labs prior--CBC, c-met, lipid, uric acid, A1c, TSH    Medicare Attestation I have personally reviewed: The patient's medical and social history Their use of alcohol, tobacco or illicit drugs Their current medications and supplements The patient's functional ability including ADLs,fall risks, home safety risks, cognitive, and hearing and visual impairment Diet and physical activities Evidence for depression or mood disorders  The patient's weight, height, BMI, and visual acuity have been recorded in the chart.  I have made referrals, counseling, and provided education to the patient based on review  of the above and I have provided the patient with a written personalized care plan for preventive services.

## 2021-07-30 ENCOUNTER — Ambulatory Visit (INDEPENDENT_AMBULATORY_CARE_PROVIDER_SITE_OTHER): Payer: Medicare HMO | Admitting: Family Medicine

## 2021-07-30 ENCOUNTER — Encounter: Payer: Self-pay | Admitting: Family Medicine

## 2021-07-30 VITALS — BP 120/60 | HR 68 | Ht 60.0 in | Wt 153.8 lb

## 2021-07-30 DIAGNOSIS — Z7189 Other specified counseling: Secondary | ICD-10-CM | POA: Diagnosis not present

## 2021-07-30 DIAGNOSIS — N3941 Urge incontinence: Secondary | ICD-10-CM | POA: Diagnosis not present

## 2021-07-30 DIAGNOSIS — Z0000 Encounter for general adult medical examination without abnormal findings: Secondary | ICD-10-CM

## 2021-07-30 DIAGNOSIS — I700 Atherosclerosis of aorta: Secondary | ICD-10-CM

## 2021-07-30 DIAGNOSIS — M5441 Lumbago with sciatica, right side: Secondary | ICD-10-CM

## 2021-07-30 DIAGNOSIS — N1832 Chronic kidney disease, stage 3b: Secondary | ICD-10-CM | POA: Diagnosis not present

## 2021-07-30 DIAGNOSIS — M109 Gout, unspecified: Secondary | ICD-10-CM | POA: Diagnosis not present

## 2021-07-30 DIAGNOSIS — Z853 Personal history of malignant neoplasm of breast: Secondary | ICD-10-CM | POA: Diagnosis not present

## 2021-07-30 DIAGNOSIS — H6123 Impacted cerumen, bilateral: Secondary | ICD-10-CM

## 2021-07-30 DIAGNOSIS — R7303 Prediabetes: Secondary | ICD-10-CM | POA: Diagnosis not present

## 2021-07-30 DIAGNOSIS — G8929 Other chronic pain: Secondary | ICD-10-CM

## 2021-07-30 DIAGNOSIS — Z5181 Encounter for therapeutic drug level monitoring: Secondary | ICD-10-CM | POA: Diagnosis not present

## 2021-07-30 DIAGNOSIS — I10 Essential (primary) hypertension: Secondary | ICD-10-CM | POA: Diagnosis not present

## 2021-07-30 DIAGNOSIS — E782 Mixed hyperlipidemia: Secondary | ICD-10-CM | POA: Diagnosis not present

## 2021-07-30 DIAGNOSIS — I480 Paroxysmal atrial fibrillation: Secondary | ICD-10-CM

## 2021-07-30 DIAGNOSIS — H9193 Unspecified hearing loss, bilateral: Secondary | ICD-10-CM

## 2021-08-08 DIAGNOSIS — M5459 Other low back pain: Secondary | ICD-10-CM | POA: Diagnosis not present

## 2021-08-08 DIAGNOSIS — M25551 Pain in right hip: Secondary | ICD-10-CM | POA: Diagnosis not present

## 2021-08-08 DIAGNOSIS — R0602 Shortness of breath: Secondary | ICD-10-CM | POA: Diagnosis not present

## 2021-08-08 DIAGNOSIS — M6281 Muscle weakness (generalized): Secondary | ICD-10-CM | POA: Diagnosis not present

## 2021-08-08 DIAGNOSIS — R2681 Unsteadiness on feet: Secondary | ICD-10-CM | POA: Diagnosis not present

## 2021-08-09 ENCOUNTER — Other Ambulatory Visit: Payer: Self-pay | Admitting: Interventional Cardiology

## 2021-08-11 DIAGNOSIS — H43813 Vitreous degeneration, bilateral: Secondary | ICD-10-CM | POA: Diagnosis not present

## 2021-08-12 DIAGNOSIS — R0602 Shortness of breath: Secondary | ICD-10-CM | POA: Diagnosis not present

## 2021-08-12 DIAGNOSIS — M5459 Other low back pain: Secondary | ICD-10-CM | POA: Diagnosis not present

## 2021-08-12 DIAGNOSIS — R2681 Unsteadiness on feet: Secondary | ICD-10-CM | POA: Diagnosis not present

## 2021-08-12 DIAGNOSIS — M6281 Muscle weakness (generalized): Secondary | ICD-10-CM | POA: Diagnosis not present

## 2021-08-12 DIAGNOSIS — M25551 Pain in right hip: Secondary | ICD-10-CM | POA: Diagnosis not present

## 2021-09-22 ENCOUNTER — Other Ambulatory Visit: Payer: Self-pay | Admitting: Family Medicine

## 2021-10-01 ENCOUNTER — Telehealth: Payer: Self-pay

## 2021-10-01 NOTE — Telephone Encounter (Signed)
Pt. Called wanting to talk with you asap. She stats she has extreme stomach pain, nausea and vomiting since about 10 a.m. wants to talk to you to see what to do now.

## 2021-10-01 NOTE — Telephone Encounter (Signed)
Spoke with patient and she is better than she was when she called. She vomited and is feeling much better, some belching still and right sided abd pain. She woke up and had cereal and a banana and started feeling badly around 11:00-intense abd pain. Feels bloated as well. No fever. She will be here tomorrow at 9:15 unless something worsens, she was instructed to go to UC or to ER.

## 2021-10-02 ENCOUNTER — Encounter: Payer: Self-pay | Admitting: Family Medicine

## 2021-10-02 ENCOUNTER — Ambulatory Visit (INDEPENDENT_AMBULATORY_CARE_PROVIDER_SITE_OTHER): Payer: Medicare HMO | Admitting: Family Medicine

## 2021-10-02 VITALS — BP 118/60 | HR 72 | Temp 98.2°F | Ht 60.0 in | Wt 153.6 lb

## 2021-10-02 DIAGNOSIS — R109 Unspecified abdominal pain: Secondary | ICD-10-CM

## 2021-10-02 DIAGNOSIS — Z7189 Other specified counseling: Secondary | ICD-10-CM | POA: Diagnosis not present

## 2021-10-02 DIAGNOSIS — R1031 Right lower quadrant pain: Secondary | ICD-10-CM

## 2021-10-02 LAB — CBC WITH DIFFERENTIAL/PLATELET
Basophils Absolute: 0 10*3/uL (ref 0.0–0.2)
Basos: 0 %
EOS (ABSOLUTE): 0.1 10*3/uL (ref 0.0–0.4)
Eos: 2 %
Hematocrit: 39 % (ref 34.0–46.6)
Hemoglobin: 13.1 g/dL (ref 11.1–15.9)
Lymphocytes Absolute: 1.6 10*3/uL (ref 0.7–3.1)
Lymphs: 25 %
MCH: 31.3 pg (ref 26.6–33.0)
MCHC: 33.6 g/dL (ref 31.5–35.7)
MCV: 93 fL (ref 79–97)
Monocytes Absolute: 0.6 10*3/uL (ref 0.1–0.9)
Monocytes: 9 %
Neutrophils Absolute: 4.3 10*3/uL (ref 1.4–7.0)
Neutrophils: 64 %
Platelets: 187 10*3/uL (ref 150–450)
RBC: 4.19 x10E6/uL (ref 3.77–5.28)
RDW: 14.2 % (ref 11.7–15.4)
WBC: 6.6 10*3/uL (ref 3.4–10.8)

## 2021-10-02 LAB — POCT URINALYSIS DIP (PROADVANTAGE DEVICE)
Bilirubin, UA: NEGATIVE
Blood, UA: NEGATIVE
Glucose, UA: NEGATIVE mg/dL
Ketones, POC UA: NEGATIVE mg/dL
Leukocytes, UA: NEGATIVE
Nitrite, UA: NEGATIVE
Protein Ur, POC: NEGATIVE mg/dL
Specific Gravity, Urine: 1.015
Urobilinogen, Ur: NEGATIVE
pH, UA: 6 (ref 5.0–8.0)

## 2021-10-02 NOTE — Patient Instructions (Addendum)
Avoid dairy, or use lactaid pill prior to eating dairy (ie mac and cheese). Continue the Lactaid milk. Try and either limit gas-producing foods (certain vegetables, beans, etc), or try taking Bean-o prior to eating them. Once you have gas and bloating, use simethicone (Gas-X) to help with this. This is for bloating and trapped gas, it won't help if you are PASSING the gas.  Consider taking a probiotic daily.  Follow up if ongoing issues, especially if they persist or worsen. Consider keeping a food journal so that you can help identify which foods may trigger your symptoms

## 2021-10-02 NOTE — Progress Notes (Signed)
Chief Complaint  Patient presents with   Abdominal Pain    Right sided abdominal pain, all over. Could not ever wear her bra the pain was so bad. Had some vomiting, bloating and belching. Has had this over the years but is coming more often. Not having anymore pain, stopped last night. Patient reports that she had her gallbladder out over 20 years ago and someone told her that could act up later on.   She felt fine yesterday morning.  After her late morning shower she felt a little dizzy, queasy. This seemed to pass on its own, felt better.  She had eaten cereal and 1/2 banana about 2.5 hours prior to this.  Later she developed abdominal bloating, pressure, felt 9 months pregnant, couldn't keep her clothes on her stomach. Bloating went so high in her upper stomach that she couldn't even tolerate wearing her bra.  She skipped lunch. She ended up vomiting (around 2pm).  Emesis was not bloody/maroon or coffee grounds. She took a prilosec in the morning hoping that would help, but it didn't. She had also developed RLQ pain which lasted until 8-9pm. Feeling very gassy most of the afternoon.  She reports her bowels have been okay--she had 3 BMs yesterday, one prior to feeling bad, the others later that morning.  She didn't have any loose stools, stools weren't hard either. No melena or hematochezia. She reports that over the weekend she had some constipation, so took Miralax daily x 3 days, stools were improving.  Started Metamucil on Tuesday night (1 dose only).  She has used metamucil off and on without problems in the past.  She uses Lactaid milk. Has some mac and cheese earlier this week, and beans twice this week (Mon and Tues). This is not typical for her.  She has had flareups of abdominal pain and bloating intermittently, but she reports that the episodes are occurring more frequently.  She hasn't really paid attention if there are particular food triggers.  Husband accompanies her today.   Reports that he is now in hospice for CHF.  Trecia has more responsibility, and more physical activity (pushing him in wheelchair at times).  PMH, PSH, SH reviewed She brings back completed paperwork for MOST and DNR forms today.  Outpatient Encounter Medications as of 10/02/2021  Medication Sig Note   acetaminophen (TYLENOL) 650 MG CR tablet Take 1,300 mg by mouth 2 (two) times daily.    allopurinol (ZYLOPRIM) 100 MG tablet TAKE TWO TABLETS BY MOUTH DAILY    amiodarone (PACERONE) 200 MG tablet TAKE 1/2 TABLET BY MOUTH DAILY    diltiazem (CARDIZEM CD) 240 MG 24 hr capsule Take 1 capsule (240 mg total) by mouth daily.    hydrochlorothiazide (MICROZIDE) 12.5 MG capsule TAKE ONE CAPSULE BY MOUTH DAILY    Multiple Vitamins-Minerals (CENTRUM SILVER PO) Take 1 tablet by mouth daily.    omeprazole (PRILOSEC) 20 MG capsule Take 1 capsule (20 mg total) by mouth daily. Reported on 05/23/2015 10/02/2021: Last dose yesterday   polyethylene glycol (MIRALAX / GLYCOLAX) 17 g packet Take 17 g by mouth daily. 10/02/2021: Last dose Monday   pravastatin (PRAVACHOL) 40 MG tablet Take 1 tablet (40 mg total) by mouth daily.    psyllium (METAMUCIL) 58.6 % packet Take 1 packet by mouth daily. 10/02/2021: Last dose Tuesday   tolterodine (DETROL LA) 4 MG 24 hr capsule TAKE ONE CAPSULE BY MOUTH DAILY    XARELTO 15 MG TABS tablet TAKE ONE TABLET BY MOUTH DAILY WITH  SUPPER    calcium carbonate (TUMS - DOSED IN MG ELEMENTAL CALCIUM) 500 MG chewable tablet Chew 1 tablet by mouth daily as needed for indigestion or heartburn. Reported on 07/10/2015 (Patient not taking: Reported on 10/02/2021) 10/02/2021: prn   Calcium Carbonate-Vitamin D 600-400 MG-UNIT tablet Take 1 tablet by mouth daily.  (Patient not taking: Reported on 10/02/2021)    colchicine 0.6 MG tablet Use as directed. Take two tablets by mouth at start of gout flare up. Then one hour later take one tablet by mouth. (Patient not taking: Reported on 07/30/2021) 07/30/2021: prn   No  facility-administered encounter medications on file as of 10/02/2021.   Allergies  Allergen Reactions   Cephalexin Itching   Ciprofloxacin Itching   Diclofenac Nausea Only    Upset stomach   Shellfish-Derived Products Nausea And Vomiting   Tape Rash    ROS: no fever, chills, URI symptoms, chest pain, shortness of breath, edema. Abdominal pain and bloating (diffuse, and pain RLQ) and 1 episode of emesis yesterday, per HPI.  Some constipation over the weekend, improved with miralax. Denies melena, hematochezia, hematemesis, coffee ground emesis.  Denies weight change.  Decreased appetite yesterday, but overall okay.  No heartburn, dysphagia. Moods are good. See HPI   PHYSICAL EXAM:  BP 118/60   Pulse 72   Temp 98.2 F (36.8 C) (Tympanic)   Ht 5' (1.524 m)   Wt 153 lb 9.6 oz (69.7 kg)   BMI 30.00 kg/m    Wt Readings from Last 3 Encounters:  10/02/21 153 lb 9.6 oz (69.7 kg)  07/30/21 153 lb 12.8 oz (69.8 kg)  07/01/21 153 lb 6.4 oz (69.6 kg)   Well-appearing, pleasant elderly female, in good spirits, in no distress HEENT: conjunctiva and sclera are normal. OP clear, moist mucus membranes Neck: no lymphadenopathy, thyromegaly or mass Heart: regular rate and rhythm Lungs: clear bilaterally Back: no spinal or CVA tenderness Abdomen: active bowel sounds.  No organomegaly or mass, no epigastric tenderness. Mildly tender at RLQ. No rebound, guarding or masses. Nontender on the left, no suprapubic tenderness Extremities: no edema Skin: normal turgor, no rashes Neuro: alert and oriented, cranial nerves grossly intact. Normal gait Psych: normal mood, affect, hygiene, grooming, eye contact and speech.  Normal urine dip  ASSESSMENT/PLAN:  Abdominal pain, unspecified abdominal location - diffuse bloating and discomfort yesterday, resolved today. Suspect gas related to change in diet. Reviewed diet, meds. - Plan: POCT Urinalysis DIP (Proadvantage Device)  RLQ abdominal pain - very  mild persistent discomfort. DDx reviewed. Will check CBC, and if elevated WBC, may need imaging sooner rather than just observation - Plan: CBC with Differential/Platelet  Advanced care planning/counseling discussion - Reviewed her completed forms/signed. DNR, limited add'l interventions, IV fluids for defined trial period, no feeding tube  MOST form--DNR, limited add'l interventions, IV fluids for defined trial period, no feeding tube. Completed by patient--signed by me, originals given to pt to keep at home, copy to scan into chart. DNR form completed/signed at last visit.   Avoid dairy, or use lactaid pill prior to eating dairy (ie mac and cheese). Continue the Lactaid milk. Try and either limit gas-producing foods (certain vegetables, beans, etc), or try taking Bean-o prior to eating them. Once you have gas and bloating, use simethicone (Gas-X) to help with this. This is for bloating and trapped gas, it won't help if you are PASSING the gas.

## 2021-11-07 ENCOUNTER — Other Ambulatory Visit: Payer: Self-pay

## 2021-11-07 MED ORDER — AMIODARONE HCL 200 MG PO TABS: 100.0000 mg | ORAL_TABLET | Freq: Every day | ORAL | 0 refills | Status: DC

## 2021-11-07 MED ORDER — HYDROCHLOROTHIAZIDE 12.5 MG PO CAPS: 12.5000 mg | ORAL_CAPSULE | Freq: Every day | ORAL | 0 refills | Status: DC

## 2021-11-07 NOTE — Addendum Note (Signed)
Addended by: Carter Kitten D on: 11/07/2021 09:02 AM   Modules accepted: Orders

## 2021-11-11 ENCOUNTER — Ambulatory Visit: Payer: Medicare HMO | Admitting: Podiatry

## 2021-11-11 ENCOUNTER — Encounter: Payer: Self-pay | Admitting: Podiatry

## 2021-11-11 DIAGNOSIS — Q828 Other specified congenital malformations of skin: Secondary | ICD-10-CM

## 2021-11-11 DIAGNOSIS — M79674 Pain in right toe(s): Secondary | ICD-10-CM | POA: Diagnosis not present

## 2021-11-11 DIAGNOSIS — B351 Tinea unguium: Secondary | ICD-10-CM

## 2021-11-11 DIAGNOSIS — I739 Peripheral vascular disease, unspecified: Secondary | ICD-10-CM

## 2021-11-11 DIAGNOSIS — M79675 Pain in left toe(s): Secondary | ICD-10-CM | POA: Diagnosis not present

## 2021-11-11 DIAGNOSIS — L84 Corns and callosities: Secondary | ICD-10-CM

## 2021-11-16 NOTE — Progress Notes (Signed)
  Subjective:  Patient ID: Connie [name], female    DOB: [birth year],  MRN: [id number]  Connie [name] presents to clinic today for for at risk foot care. Patient has h/o PAD and callus(es) b/l lower extremities and painful thick toenails that are difficult to trim. Painful toenails interfere with ambulation. Aggravating factors include wearing enclosed shoe gear. Pain is relieved with periodic professional debridement. Painful calluses are aggravated when weightbearing with and without shoegear. Pain is relieved with periodic professional debridement.  She is on blood thinner, Xarelto, for afib.  She and her husband have celebrated their 70th wedding anniversary.  New problem(s): None.   PCP is Rita Ohara, MD , and last visit was  October 02, 2021  Allergies  Allergen Reactions   Cephalexin Itching   Ciprofloxacin Itching   Diclofenac Nausea Only    Upset stomach   Shellfish-Derived Products Nausea And Vomiting   Tape Rash    Review of Systems: Negative except as noted in the HPI.  Objective: No changes noted in today's physical examination.  Vascular Examination: CFT <4 seconds b/l. DP pulses faintly palpable b/l. PT pulses diminished b/l. Skin temperature gradient warm to warm b/l. No ischemia or gangrene. No cyanosis or clubbing noted b/l. Pedal hair sparse. Trace edema noted BLE.   Neurological Examination: Sensation grossly intact b/l with 10 gram monofilament. Vibratory sensation intact b/l.   Dermatological Examination: Pedal skin warm and supple b/l. Toenails 1-5 b/l thick, discolored, elongated with subungual debris and pain on dorsal palpation.  Hyperkeratotic lesion(s) 1st metatarsal head right lower extremity.  No erythema, no edema, no drainage, no fluctuance. Porokeratotic lesion(s) submet head 2 left foot. No erythema, no edema, no drainage, no fluctuance.  Musculoskeletal Examination: Normal muscle strength 5/5 to all lower extremity muscle groups  bilaterally. HAV with bunion deformity noted b/l LE. Hammertoe(s) noted to the bilateral 2nd toes.. No pain, crepitus or joint limitation noted with ROM b/l LE.  Patient ambulates independently without assistive aids.  Radiographs: None  Last A1c:      Latest Ref Rng & Units 07/23/2021   10:27 AM 01/27/2021    8:57 AM  Hemoglobin A1C  Hemoglobin-A1c 4.8 - 5.6 % 5.9  6.0    Assessment/Plan: 1. Pain due to onychomycosis of toenails of both feet   2. Callus of foot   3. Porokeratosis   4. PAD (peripheral artery disease) ([place])      -Patient was evaluated and treated. All patient's and/or POA's questions/concerns answered on today's visit. -Patient to continue soft, supportive shoe gear daily. -Mycotic toenails 1-5 bilaterally were debrided in length and girth with sterile nail nippers and dremel without incident. -Callus(es) 1st metatarsal head right lower extremity pared utilizing sterile scalpel blade without complication or incident. Total number debrided =1. -Porokeratotic lesion(s) submet head 2 left foot pared and enucleated with sterile scalpel blade without incident. Total number of lesions debrided=1. -Patient/POA to call should there be question/concern in the interim.   Return in about 10 weeks (around 01/20/2022).  Marzetta Board, DPM

## 2021-12-08 ENCOUNTER — Other Ambulatory Visit: Payer: Self-pay | Admitting: Interventional Cardiology

## 2021-12-21 ENCOUNTER — Other Ambulatory Visit: Payer: Self-pay | Admitting: Interventional Cardiology

## 2021-12-21 ENCOUNTER — Other Ambulatory Visit: Payer: Self-pay | Admitting: Family Medicine

## 2021-12-22 NOTE — Telephone Encounter (Signed)
Prescription refill request for Xarelto received.  Indication: PAF Last office visit: 11/19/20  Linard Millers MD  (Has appt on 01/13/22) Weight: 72.9kg Age: 88 Scr: 1.30 on 07/23/21 CrCl: 31.78  Based on above findings Xarelto '15mg'$  daily is the appropriate dose.  Refill approved.

## 2021-12-29 ENCOUNTER — Other Ambulatory Visit: Payer: Self-pay | Admitting: Interventional Cardiology

## 2022-01-07 ENCOUNTER — Encounter: Payer: Self-pay | Admitting: Internal Medicine

## 2022-01-12 NOTE — Progress Notes (Signed)
Cardiology Office Note:    Date:  01/13/2022   ID:  Connie [name], DOB [birth year], MRN [id number]  PCP:  Rita Ohara, MD  Cardiologist:  Sinclair Grooms, MD   Referring MD: Rita Ohara, MD   Chief Complaint  Patient presents with   Atrial Fibrillation   Follow-up    Amiodarone    History of Present Illness:    Connie [name] is a 88 y.o. female with a hx of primary hypertension, hyperlipidemia, high burden paroxysmal atrial fibrillation (monitor 04/2020) on anticoagulation, and initiation Amiodarone to suppress AF.   She is doing relatively well.  Denies palpitations.  Has had a couple episodes of extreme fatigue.  1 episode occurred while at home.  It lasted about 15 minutes and resolved.  No palpitations.  Had a [employer] game earlier this summer she had near fainting episode in the high conditions.  She did not faint.  Had no palpitations.  She denies chest pain, orthopnea, PND.  No specific medication side effects.  Appetite is good.  Past Medical History:  Diagnosis Date   Abnormal brain MRI 8/01   small hemorrhagic stroke and possible cavernous hemangioma (Dr. Erling Cruz)   Adenomatous colon polyp 1/03   Arthritis    OA spine, hands   Breast cancer ([place]) 2001   R breast (T3N1, ER/PR+, HER-2 +) s/p mastectomy, chemo and chest wall irradiation (Dr. Truddie Coco)   DDD (degenerative disc disease), lumbar    GERD (gastroesophageal reflux disease)    Hearing loss 2012   wears hearing aids   Herpes zoster 2003   Hyperlipidemia    Hypertension    Onychomycosis 2003, 2008   treated with Lamisil   Osteopenia    (DEXA's done by Dr. Truddie Coco); prev took Fosamax.  No change in DEXA after off Fosamax x 2 years   Ovarian cyst 12/2008   right   Personal history of chemotherapy    Personal history of radiation therapy    Postmenopausal bleeding 2000   benign EMB (Dr. Raphael Gibney)   Urge urinary incontinence     Past Surgical History:  Procedure Laterality Date    CATARACT EXTRACTION  summer 2011   bilateral   CHOLECYSTECTOMY, LAPAROSCOPIC  1/05   COLONOSCOPY  12/17/08   normal (Dr. Wynetta Emery)   COLONOSCOPY W/ POLYPECTOMY  05/23/01   adenomatous polyp   MASTECTOMY Right 2001   modified mastectomy and tram flap reconstruction  2001   R breast   TONSILLECTOMY     tram flap removal     problems with flap after radiation   TUBAL LIGATION      Current Medications: Current Meds  Medication Sig   acetaminophen (TYLENOL) 650 MG CR tablet Take 1,300 mg by mouth 2 (two) times daily.   allopurinol (ZYLOPRIM) 100 MG tablet TAKE TWO TABLETS BY MOUTH DAILY   amiodarone (PACERONE) 200 MG tablet Take 1 tablet (200 mg total) by mouth daily. Please keep upcoming appt.with Dr. Tamala Julian in Sept.in order to receive future refills. Thank You.   calcium carbonate (TUMS - DOSED IN MG ELEMENTAL CALCIUM) 500 MG chewable tablet Chew 1 tablet by mouth daily as needed for indigestion or heartburn. Reported on 07/10/2015   Calcium Carbonate-Vitamin D 600-400 MG-UNIT tablet Take 1 tablet by mouth daily.   colchicine 0.6 MG tablet Use as directed. Take two tablets by mouth at start of gout flare up. Then one hour later take one tablet by mouth.   diltiazem (CARDIZEM CD) 240 MG 24  hr capsule Take 1 capsule (240 mg total) by mouth daily. Please keep upcoming appt.with Dr. Tamala Julian in Sept.in order to receive future refills. Thank You.   hydrochlorothiazide (MICROZIDE) 12.5 MG capsule TAKE 1 CAPSULE BY MOUTH DAILY   Multiple Vitamins-Minerals (CENTRUM SILVER PO) Take 1 tablet by mouth daily.   omeprazole (PRILOSEC) 20 MG capsule Take 1 capsule (20 mg total) by mouth daily. Reported on 05/23/2015 (Patient taking differently: Take 20 mg by mouth as needed. Reported on 05/23/2015)   polyethylene glycol (MIRALAX / GLYCOLAX) 17 g packet Take 17 g by mouth daily.   pravastatin (PRAVACHOL) 40 MG tablet Take 1 tablet (40 mg total) by mouth daily.   tolterodine (DETROL LA) 4 MG 24 hr capsule TAKE 1  CAPSULE BY MOUTH DAILY   XARELTO 15 MG TABS tablet TAKE ONE TABLET BY MOUTH DAILY WITH SUPPER     Allergies:   Cephalexin, Ciprofloxacin, Diclofenac, Shellfish-derived products, and Tape   Social History   Socioeconomic History   Marital status: Married    Spouse name: Not on file   Number of children: 3   Years of education: Not on file   Highest education level: Not on file  Occupational History   Not on file  Tobacco Use   Smoking status: Never   Smokeless tobacco: Never  Vaping Use   Vaping Use: Never used  Substance and Sexual Activity   Alcohol use: No   Drug use: No   Sexual activity: Not Currently    Comment: due to poor libido and pain  Other Topics Concern   Not on file  Social History Narrative   Moved 06/2018 to [place]. Lives with husband.   1 daughter at [employer]; daughter in [place] (works in Conservator, museum/gallery at [employer]), son in [place]. 8 grandchildren, 4 great-grandchildren      Husband on hospice for CHF.      Updated 10/2021.   Social Determinants of Health   Financial Resource Strain: Not on file  Food Insecurity: Not on file  Transportation Needs: Not on file  Physical Activity: Not on file  Stress: Not on file  Social Connections: Not on file     Family History: The patient's family history includes Diabetes in her brother; Heart disease in her brother, father, and mother; Hypertension in her father and mother. There is no history of Drug abuse, Cancer, or Breast cancer.  ROS:   Please see the history of present illness.    Decreased hearing.  Appetite is not as good as it was.  All other systems reviewed and are negative.  EKGs/Labs/Other Studies Reviewed:    The following studies were reviewed today:  2D Doppler echocardiogram 04/2020: IMPRESSIONS   1. Left ventricular ejection fraction, by estimation, is 55 to 60%. The  left ventricle has normal function. The left ventricle has no regional  wall motion abnormalities. Left  ventricular diastolic function could not  be evaluated.   2. Right ventricular systolic function is normal. The right ventricular  size is normal. There is normal pulmonary artery systolic pressure. The  estimated right ventricular systolic pressure is 17.4 mmHg.   3. Left atrial size was mildly dilated.   4. A small pericardial effusion is present. The pericardial effusion is  circumferential. There is no evidence of cardiac tamponade.   5. The mitral valve is degenerative. Trivial mitral valve regurgitation.  No evidence of mitral stenosis. Moderate mitral annular calcification.   6. The aortic valve is tricuspid. There  is mild calcification of the  aortic valve. There is mild thickening of the aortic valve. Aortic valve  regurgitation is mild. Mild aortic valve sclerosis is present, with no  evidence of aortic valve stenosis.   7. The inferior vena cava is normal in size with greater than 50%  respiratory variability, suggesting right atrial pressure of 3 mmHg.  EKG:  EKG normal sinus rhythm, QTc 467 ms.  Normal PR interval.  QS pattern V1 through V3.  Recent Labs: 07/23/2021: ALT 12; BUN 32; Creatinine, Ser 1.30; Potassium 3.7; Sodium 143; TSH 4.090 10/02/2021: Hemoglobin 13.1; Platelets 187  Recent Lipid Panel    Component Value Date/Time   CHOL 155 01/27/2021 0857   TRIG 141 01/27/2021 0857   HDL 49 01/27/2021 0857   CHOLHDL 3.2 01/27/2021 0857   CHOLHDL 3.6 03/18/2017 1343   VLDL 41 (H) 12/08/2016 0755   LDLCALC 81 01/27/2021 0857   LDLCALC 84 03/18/2017 1343    Physical Exam:    VS:  BP (!) 122/58   Pulse 71   Ht 5' (1.524 m)   Wt 157 lb 9.6 oz (71.5 kg)   SpO2 95%   BMI 30.78 kg/m     Wt Readings from Last 3 Encounters:  01/13/22 157 lb 9.6 oz (71.5 kg)  10/02/21 153 lb 9.6 oz (69.7 kg)  07/30/21 153 lb 12.8 oz (69.8 kg)     GEN: Appears younger than the stated age. No acute distress HEENT: Normal NECK: No JVD.  Decreased hearing LYMPHATICS: No  lymphadenopathy CARDIAC: No murmur. RRR no gallop, or edema. VASCULAR:  Normal Pulses. No bruits. RESPIRATORY:  Clear to auscultation without rales, wheezing or rhonchi  ABDOMEN: Soft, non-tender, non-distended, No pulsatile mass, MUSCULOSKELETAL: No deformity  SKIN: Warm and dry NEUROLOGIC:  Alert and oriented x 3 PSYCHIATRIC:  Normal affect   ASSESSMENT:    1. PAF (paroxysmal atrial fibrillation) ([place])   2. Chronic anticoagulation   3. Long term current use of amiodarone   4. Essential hypertension   5. Pure hypercholesterolemia    PLAN:    In order of problems listed above:  Possible episodes of A-fib account for the 2 episodes described above.  It is more likely with the first episode and less likely with a 1 that occurred in the hot sun.  That sounded more like dehydration or vasovagal. Continue Eliquis. Check liver panel and TSH today. Blood pressure is excellent on her current regimen of diltiazem, Microzide. Continue pravastatin   Medication Adjustments/Labs and Tests Ordered: Current medicines are reviewed at length with the patient today.  Concerns regarding medicines are outlined above.  Orders Placed This Encounter  Procedures   Comprehensive metabolic panel   TSH   EKG 12-Lead   No orders of the defined types were placed in this encounter.   Patient Instructions  Medication Instructions:  Your physician recommends that you continue on your current medications as directed. Please refer to the Current Medication list given to you today.  *If you need a refill on your cardiac medications before your next appointment, please call your pharmacy*  Lab Work: TODAY: CMET, TSH If you have labs (blood work) drawn today and your tests are completely normal, you will receive your results only by: [place] (if you have MyChart) OR A paper copy in the mail If you have any lab test that is abnormal or we need to change your treatment, we will call you to review  the results.  Testing/Procedures: NONE  Follow-Up:  At [facility], you and your health needs are our priority.  As part of our continuing mission to provide you with exceptional heart care, we have created designated Provider Care Teams.  These Care Teams include your primary Cardiologist (physician) and Advanced Practice Providers (APPs -  Physician Assistants and Nurse Practitioners) who all work together to provide you with the care you need, when you need it.  Your next appointment:   1 year(s)  The format for your next appointment:   In Person  Provider:   Sinclair Grooms, MD    Important Information About Sugar         Signed, Sinclair Grooms, MD  01/13/2022 4:10 PM    [place]

## 2022-01-13 ENCOUNTER — Encounter: Payer: Self-pay | Admitting: Interventional Cardiology

## 2022-01-13 ENCOUNTER — Ambulatory Visit: Payer: Medicare HMO | Attending: Interventional Cardiology | Admitting: Interventional Cardiology

## 2022-01-13 VITALS — BP 122/58 | HR 71 | Ht 60.0 in | Wt 157.6 lb

## 2022-01-13 DIAGNOSIS — I10 Essential (primary) hypertension: Secondary | ICD-10-CM

## 2022-01-13 DIAGNOSIS — E7800 Pure hypercholesterolemia, unspecified: Secondary | ICD-10-CM

## 2022-01-13 DIAGNOSIS — Z79899 Other long term (current) drug therapy: Secondary | ICD-10-CM

## 2022-01-13 DIAGNOSIS — Z7901 Long term (current) use of anticoagulants: Secondary | ICD-10-CM

## 2022-01-13 DIAGNOSIS — I480 Paroxysmal atrial fibrillation: Secondary | ICD-10-CM | POA: Diagnosis not present

## 2022-01-13 NOTE — Patient Instructions (Signed)
Medication Instructions:  Your physician recommends that you continue on your current medications as directed. Please refer to the Current Medication list given to you today.  *If you need a refill on your cardiac medications before your next appointment, please call your pharmacy*  Lab Work: TODAY: CMET, TSH If you have labs (blood work) drawn today and your tests are completely normal, you will receive your results only by: [place] (if you have MyChart) OR A paper copy in the mail If you have any lab test that is abnormal or we need to change your treatment, we will call you to review the results.  Testing/Procedures: NONE  Follow-Up: At [facility], you and your health needs are our priority.  As part of our continuing mission to provide you with exceptional heart care, we have created designated Provider Care Teams.  These Care Teams include your primary Cardiologist (physician) and Advanced Practice Providers (APPs -  Physician Assistants and Nurse Practitioners) who all work together to provide you with the care you need, when you need it.  Your next appointment:   1 year(s)  The format for your next appointment:   In Person  Provider:   Sinclair Grooms, MD    Important Information About Sugar

## 2022-01-14 LAB — COMPREHENSIVE METABOLIC PANEL
ALT: 12 IU/L (ref 0–32)
AST: 18 IU/L (ref 0–40)
Albumin/Globulin Ratio: 2.4 — ABNORMAL HIGH (ref 1.2–2.2)
Albumin: 4.4 g/dL (ref 3.6–4.6)
Alkaline Phosphatase: 95 IU/L (ref 44–121)
BUN/Creatinine Ratio: 18 (ref 12–28)
BUN: 31 mg/dL (ref 10–36)
Bilirubin Total: 0.3 mg/dL (ref 0.0–1.2)
CO2: 29 mmol/L (ref 20–29)
Calcium: 9.5 mg/dL (ref 8.7–10.3)
Chloride: 100 mmol/L (ref 96–106)
Creatinine, Ser: 1.75 mg/dL — ABNORMAL HIGH (ref 0.57–1.00)
Globulin, Total: 1.8 g/dL (ref 1.5–4.5)
Glucose: 101 mg/dL — ABNORMAL HIGH (ref 70–99)
Potassium: 4 mmol/L (ref 3.5–5.2)
Sodium: 143 mmol/L (ref 134–144)
Total Protein: 6.2 g/dL (ref 6.0–8.5)
eGFR: 27 mL/min/{1.73_m2} — ABNORMAL LOW (ref >59)

## 2022-01-14 LAB — TSH: TSH: 4.02 u[IU]/mL (ref 0.450–4.500)

## 2022-01-15 ENCOUNTER — Telehealth: Payer: Self-pay

## 2022-01-15 DIAGNOSIS — I10 Essential (primary) hypertension: Secondary | ICD-10-CM

## 2022-01-15 NOTE — Telephone Encounter (Signed)
-----   Message from Belva Crome, MD sent at 01/14/2022 10:33 AM EDT ----- Let the patient know there is evidence of dehydration. Stop HCTZ and see how BP does. Could explain some of her recent symptom/ episodes. BMET 2 weeks to tract kidney numbers. A copy will be sent to Stacie Glaze, DO

## 2022-01-15 NOTE — Telephone Encounter (Signed)
Spoke with patient and discussed lab results.  Per Dr. Tamala Julian: Let the patient know there is evidence of dehydration. Stop HCTZ and see how BP does. Could explain some of her recent symptom/ episodes. BMET 2 weeks to tract kidney numbers.  HCTZ discontinued, med list updated. BMET scheduled for 01/29/22.  Patient verbalized understanding and expressed appreciation for call.

## 2022-01-29 ENCOUNTER — Other Ambulatory Visit: Payer: Medicare HMO

## 2022-02-10 ENCOUNTER — Encounter: Payer: Self-pay | Admitting: Internal Medicine

## 2022-02-11 ENCOUNTER — Other Ambulatory Visit: Payer: Medicare HMO

## 2022-02-11 ENCOUNTER — Encounter: Payer: Medicare HMO | Admitting: Family Medicine

## 2022-02-11 ENCOUNTER — Ambulatory Visit: Payer: Medicare HMO | Admitting: Podiatry

## 2022-02-11 ENCOUNTER — Encounter: Payer: Self-pay | Admitting: Podiatry

## 2022-02-11 DIAGNOSIS — M79674 Pain in right toe(s): Secondary | ICD-10-CM

## 2022-02-11 DIAGNOSIS — I739 Peripheral vascular disease, unspecified: Secondary | ICD-10-CM | POA: Diagnosis not present

## 2022-02-11 DIAGNOSIS — L84 Corns and callosities: Secondary | ICD-10-CM | POA: Diagnosis not present

## 2022-02-11 DIAGNOSIS — M79675 Pain in left toe(s): Secondary | ICD-10-CM | POA: Diagnosis not present

## 2022-02-11 DIAGNOSIS — B351 Tinea unguium: Secondary | ICD-10-CM | POA: Diagnosis not present

## 2022-02-11 DIAGNOSIS — Q828 Other specified congenital malformations of skin: Secondary | ICD-10-CM

## 2022-02-15 NOTE — Progress Notes (Signed)
  Subjective:  Patient ID: Connie [name], female    DOB: Nov 23, 1927,  MRN: [id number]  Connie [name] presents to clinic today for:  Chief Complaint  Patient presents with   Nail Problem    Routine foot care PCP-[facility] PCP VST-3 months ago   New problem(s): None.   PCP is Rita Ohara, MD , and last visit was  October 02, 2021.  Allergies  Allergen Reactions   Cephalexin Itching   Ciprofloxacin Itching   Diclofenac Nausea Only    Upset stomach   Shellfish-Derived Products Nausea And Vomiting   Tape Rash    Review of Systems: Negative except as noted in the HPI.  Objective: No changes noted in today's physical examination.  Connie [name] is a pleasant 88 y.o. female WD, WN in NAD. AAO x 3. Vascular Examination: CFT <4 seconds b/l. DP pulses faintly palpable b/l. PT pulses diminished b/l. Skin temperature gradient warm to warm b/l. No ischemia or gangrene. No cyanosis or clubbing noted b/l. Pedal hair sparse. Trace edema noted BLE.   Neurological Examination: Sensation grossly intact b/l with 10 gram monofilament. Vibratory sensation intact b/l.   Dermatological Examination: Pedal skin warm and supple b/l. Toenails 1-5 b/l thick, discolored, elongated with subungual debris and pain on dorsal palpation.    Hyperkeratotic lesion(s) 1st metatarsal head right lower extremity and medial aspect left 2nd PIPJ.  No erythema, no edema, no drainage, no fluctuance.   Porokeratotic lesion(s) submet head 2 left foot. No erythema, no edema, no drainage, no fluctuance.  Musculoskeletal Examination: Normal muscle strength 5/5 to all lower extremity muscle groups bilaterally.   HAV with bunion deformity noted b/l LE.   Hammertoe(s) noted to the bilateral 2nd toes.. No pain, crepitus or joint limitation noted with ROM b/l LE.  Patient ambulates independently without assistive aids.  Radiographs: None Assessment/Plan: 1. Pain due to onychomycosis of toenails of both  feet   2. Corns and callosities   3. Porokeratosis   4. PAD (peripheral artery disease) (HCC)     No orders of the defined types were placed in this encounter.   -Consent given for treatment as described below: -Examined patient. -Toenails 1-5 b/l were debrided in length and girth with sterile nail nippers and dremel without iatrogenic bleeding.  -Corn(s) L 2nd toe and callus(es) 1st metatarsal head right lower extremity were pared utilizing sterile scalpel blade without incident. Total number debrided =2. -Porokeratotic lesion(s) submet head 2 left foot pared and enucleated with sterile currette without incident. Total number of lesions debrided=1. -Dispensed tube foam. Apply to right bunion area every morning. Remove every evening. -Patient/POA to call should there be question/concern in the interim.   Return in about 3 months (around 05/14/2022).  Marzetta Board, DPM

## 2022-02-17 ENCOUNTER — Other Ambulatory Visit: Payer: Medicare HMO

## 2022-02-17 DIAGNOSIS — M109 Gout, unspecified: Secondary | ICD-10-CM

## 2022-02-17 DIAGNOSIS — N1832 Chronic kidney disease, stage 3b: Secondary | ICD-10-CM

## 2022-02-17 DIAGNOSIS — R7303 Prediabetes: Secondary | ICD-10-CM | POA: Diagnosis not present

## 2022-02-17 DIAGNOSIS — Z5181 Encounter for therapeutic drug level monitoring: Secondary | ICD-10-CM | POA: Diagnosis not present

## 2022-02-17 DIAGNOSIS — E782 Mixed hyperlipidemia: Secondary | ICD-10-CM

## 2022-02-17 DIAGNOSIS — I10 Essential (primary) hypertension: Secondary | ICD-10-CM

## 2022-02-18 ENCOUNTER — Encounter: Payer: Medicare HMO | Admitting: Family Medicine

## 2022-02-18 DIAGNOSIS — L821 Other seborrheic keratosis: Secondary | ICD-10-CM | POA: Diagnosis not present

## 2022-02-18 DIAGNOSIS — D485 Neoplasm of uncertain behavior of skin: Secondary | ICD-10-CM | POA: Diagnosis not present

## 2022-02-18 DIAGNOSIS — D2272 Melanocytic nevi of left lower limb, including hip: Secondary | ICD-10-CM | POA: Diagnosis not present

## 2022-02-18 DIAGNOSIS — D0461 Carcinoma in situ of skin of right upper limb, including shoulder: Secondary | ICD-10-CM | POA: Diagnosis not present

## 2022-02-18 DIAGNOSIS — Z85828 Personal history of other malignant neoplasm of skin: Secondary | ICD-10-CM | POA: Diagnosis not present

## 2022-02-18 DIAGNOSIS — D2271 Melanocytic nevi of right lower limb, including hip: Secondary | ICD-10-CM | POA: Diagnosis not present

## 2022-02-18 DIAGNOSIS — L814 Other melanin hyperpigmentation: Secondary | ICD-10-CM | POA: Diagnosis not present

## 2022-02-18 DIAGNOSIS — D1801 Hemangioma of skin and subcutaneous tissue: Secondary | ICD-10-CM | POA: Diagnosis not present

## 2022-02-18 DIAGNOSIS — D2262 Melanocytic nevi of left upper limb, including shoulder: Secondary | ICD-10-CM | POA: Diagnosis not present

## 2022-02-18 LAB — COMPREHENSIVE METABOLIC PANEL
ALT: 13 IU/L (ref 0–32)
AST: 16 IU/L (ref 0–40)
Albumin/Globulin Ratio: 2.1 (ref 1.2–2.2)
Albumin: 4.2 g/dL (ref 3.6–4.6)
Alkaline Phosphatase: 99 IU/L (ref 44–121)
BUN/Creatinine Ratio: 24 (ref 12–28)
BUN: 29 mg/dL (ref 10–36)
Bilirubin Total: 0.3 mg/dL (ref 0.0–1.2)
CO2: 26 mmol/L (ref 20–29)
Calcium: 9.6 mg/dL (ref 8.7–10.3)
Chloride: 103 mmol/L (ref 96–106)
Creatinine, Ser: 1.21 mg/dL — ABNORMAL HIGH (ref 0.57–1.00)
Globulin, Total: 2 g/dL (ref 1.5–4.5)
Glucose: 92 mg/dL (ref 70–99)
Potassium: 4.4 mmol/L (ref 3.5–5.2)
Sodium: 143 mmol/L (ref 134–144)
Total Protein: 6.2 g/dL (ref 6.0–8.5)
eGFR: 42 mL/min/{1.73_m2} — ABNORMAL LOW (ref >59)

## 2022-02-18 LAB — CBC WITH DIFFERENTIAL/PLATELET
Basophils Absolute: 0.1 10*3/uL (ref 0.0–0.2)
Basos: 1 %
EOS (ABSOLUTE): 0.2 10*3/uL (ref 0.0–0.4)
Eos: 3 %
Hematocrit: 39 % (ref 34.0–46.6)
Hemoglobin: 12.9 g/dL (ref 11.1–15.9)
Immature Grans (Abs): 0 10*3/uL (ref 0.0–0.1)
Immature Granulocytes: 1 %
Lymphocytes Absolute: 2 10*3/uL (ref 0.7–3.1)
Lymphs: 31 %
MCH: 30.8 pg (ref 26.6–33.0)
MCHC: 33.1 g/dL (ref 31.5–35.7)
MCV: 93 fL (ref 79–97)
Monocytes Absolute: 0.5 10*3/uL (ref 0.1–0.9)
Monocytes: 8 %
Neutrophils Absolute: 3.6 10*3/uL (ref 1.4–7.0)
Neutrophils: 56 %
Platelets: 187 10*3/uL (ref 150–450)
RBC: 4.19 x10E6/uL (ref 3.77–5.28)
RDW: 12.6 % (ref 11.7–15.4)
WBC: 6.3 10*3/uL (ref 3.4–10.8)

## 2022-02-18 LAB — LIPID PANEL
Chol/HDL Ratio: 3.2 ratio (ref 0.0–4.4)
Cholesterol, Total: 158 mg/dL (ref 100–199)
HDL: 50 mg/dL (ref >39)
LDL Chol Calc (NIH): 82 mg/dL (ref 0–99)
Triglycerides: 152 mg/dL — ABNORMAL HIGH (ref 0–149)
VLDL Cholesterol Cal: 26 mg/dL (ref 5–40)

## 2022-02-18 LAB — URIC ACID: Uric Acid: 5.1 mg/dL (ref 3.1–7.9)

## 2022-02-18 LAB — HEMOGLOBIN A1C
Est. average glucose Bld gHb Est-mCnc: 126 mg/dL
Hgb A1c MFr Bld: 6 % — ABNORMAL HIGH (ref 4.8–5.6)

## 2022-02-18 LAB — TSH: TSH: 4.11 u[IU]/mL (ref 0.450–4.500)

## 2022-02-18 NOTE — Progress Notes (Unsigned)
No chief complaint on file.   Patient presents for 6 month f/u on chronic problems. See below for labs done prior to her visit.  She was last seen in June with 1 day history of abdominal pain and bloating.  Hyperlipidemia:  Compliant with '40mg'$  of pravastatin without side effects.  She didn't tolerate fish oil in the past (due to belching), and not taking Lovaza (d/t insurance issue) or Vascepa (did not tolerate due to acid reflux, even just 1 capsule daily); TG was normal on this regimen last year. Normal diet is occasional fried fish and desserts 2x/week (not usually facility's desserts, but will get [employer] ice cream). Aortic atherosclerosis was noted (calcifications) on CXR 06/2021.   Urge urinary incontinence--Taking Detrol LA '4mg'$ .  Most of the time it works well. Occasionally has a night (about once a month) where she is up every 2 hours at night (no frequency during the day).  She usually gets up just 2-3x/night. Denies any dysuria, odor or hematuria. She wears Poise pads daily, due to the urgency and incontinence if she is not close enough to the bathroom.. She does report some skin irritation in the vaginal/perineal area; desitin previously recommended prn.    Pre-diabetes--She admits she enjoys good breads and ice cream. She tries to limit her portions.  Switched to open faced sandwiches in order to cut down on the carbs. DID SHE??   At her visit in March she was complaining of some low back pain (across the whole back), and pain and stiffness in the RLE, trouble lifting her leg to get in and out of the car. No numbness or weakness. She was prescribed another course of PT at [facility] (as this was effective for her LBP in the past). XR in 05/2020 showed advanced DDD at L4-L5 and L5-S1.  UPDATE    Atrial fibrillation:  She is under the care of Dr. Tamala Julian (cardiologist), and rhythm is controlled with amiodarone. She continues on diltiazem '240mg'$  and Xarelto. She denies any  significant bleeding.  Pt has HTN, and had been on diltiazem 240 and HCTZ.  Dr. Tamala Julian had her stop her HCTZ last month, as her Cr had bumped up to 1.75, felt there was a pre-renal component. Recheck of Cr was improved to baseline (1.2), see below.  BP's have been running  BP Readings from Last 3 Encounters:  01/13/22 (!) 122/58  10/02/21 118/60  07/30/21 120/60     Gout:  She continues to do well with '200mg'$  of allopurinol, without any gout flares (great toe).  She uses Tylenol Arthritis 2 BID, which controls her pain (feet, hands, back).   PMH, PSH, SH reviewed    ROS: no fever, chills, URI symptoms, chest pain, cough, shortness of breath. No dysuria, hematuria.  Other urinary complaints per HPI. No nausea, vomiting. Occasional heartburn. No bleeding, bruising, rash. Joint pains are controlled with tylenol Some ankle swelling at night, resolved by morning.    PHYSICAL EXAM:  There were no vitals taken for this visit.  Wt Readings from Last 3 Encounters:  01/13/22 157 lb 9.6 oz (71.5 kg)  10/02/21 153 lb 9.6 oz (69.7 kg)  07/30/21 153 lb 12.8 oz (69.8 kg)   Well developed, pleasant female, in good spirits HEENT: PERRL, EOMI, conjunctiva clear. OP clear Neck: no lymphadenopathy, thyromegaly or carotid bruit Heart: regular rate and rhythm. No heart murmur Lungs: clear bilaterally Back: no CVA or spinal tenderness Abdomen: soft, nontender, no organomegaly or mass Extremities: No edema. 2+  pulses.  Skin: no rashes.  Normal turgor.  Neuro: alert and oriented. Normal gait. Psych: normal mood, affect, hygiene and grooming  ***UPDATE IF HEART REG   Lab Results  Component Value Date   HGBA1C 6.0 (H) 02/17/2022   Fasting glucose 92    Chemistry      Component Value Date/Time   NA 143 02/17/2022 0939   NA 141 11/11/2012 1020   K 4.4 02/17/2022 0939   K 4.1 11/11/2012 1020   CL 103 02/17/2022 0939   CO2 26 02/17/2022 0939   CO2 26 11/11/2012 1020   BUN 29  02/17/2022 0939   BUN 31.6 (H) 11/11/2012 1020   CREATININE 1.21 (H) 02/17/2022 0939   CREATININE 1.16 (H) 12/08/2016 0755   CREATININE 1.5 (H) 11/11/2012 1020      Component Value Date/Time   CALCIUM 9.6 02/17/2022 0939   CALCIUM 10.1 11/11/2012 1020   ALKPHOS 99 02/17/2022 0939   ALKPHOS 78 11/11/2012 1020   AST 16 02/17/2022 0939   AST 19 11/11/2012 1020   ALT 13 02/17/2022 0939   ALT 17 11/11/2012 1020   BILITOT 0.3 02/17/2022 0939   BILITOT 0.43 11/11/2012 1020     Lab Results  Component Value Date   WBC 6.3 02/17/2022   HGB 12.9 02/17/2022   HCT 39.0 02/17/2022   MCV 93 02/17/2022   PLT 187 02/17/2022   Lab Results  Component Value Date   CHOL 158 02/17/2022   HDL 50 02/17/2022   LDLCALC 82 02/17/2022   TRIG 152 (H) 02/17/2022   CHOLHDL 3.2 02/17/2022   Lab Results  Component Value Date   LABURIC 5.1 02/17/2022   Lab Results  Component Value Date   TSH 4.110 02/17/2022      ASSESSMENT/PLAN:  Flu, COVID vaccines RSV from pharmacy.   Schedule CPE/AWV 6 mos

## 2022-02-19 ENCOUNTER — Encounter: Payer: Self-pay | Admitting: Family Medicine

## 2022-02-19 ENCOUNTER — Ambulatory Visit (INDEPENDENT_AMBULATORY_CARE_PROVIDER_SITE_OTHER): Payer: Medicare HMO | Admitting: Family Medicine

## 2022-02-19 VITALS — BP 130/60 | HR 80 | Ht 60.0 in | Wt 156.7 lb

## 2022-02-19 DIAGNOSIS — I10 Essential (primary) hypertension: Secondary | ICD-10-CM

## 2022-02-19 DIAGNOSIS — E782 Mixed hyperlipidemia: Secondary | ICD-10-CM | POA: Diagnosis not present

## 2022-02-19 DIAGNOSIS — I480 Paroxysmal atrial fibrillation: Secondary | ICD-10-CM | POA: Diagnosis not present

## 2022-02-19 DIAGNOSIS — M109 Gout, unspecified: Secondary | ICD-10-CM | POA: Diagnosis not present

## 2022-02-19 DIAGNOSIS — R7303 Prediabetes: Secondary | ICD-10-CM

## 2022-02-19 DIAGNOSIS — Z23 Encounter for immunization: Secondary | ICD-10-CM | POA: Diagnosis not present

## 2022-02-19 MED ORDER — PRAVASTATIN SODIUM 40 MG PO TABS: 40.0000 mg | ORAL_TABLET | Freq: Every day | ORAL | 3 refills | Status: DC

## 2022-02-19 NOTE — Patient Instructions (Signed)
I recommend getting the RSV vaccine from the pharmacy in 2 weeks.

## 2022-02-23 ENCOUNTER — Other Ambulatory Visit: Payer: Self-pay | Admitting: Family Medicine

## 2022-02-23 DIAGNOSIS — E782 Mixed hyperlipidemia: Secondary | ICD-10-CM

## 2022-02-27 ENCOUNTER — Encounter: Payer: Self-pay | Admitting: Family Medicine

## 2022-02-27 ENCOUNTER — Telehealth (INDEPENDENT_AMBULATORY_CARE_PROVIDER_SITE_OTHER): Payer: Medicare HMO | Admitting: Family Medicine

## 2022-02-27 VITALS — Ht 61.0 in | Wt 156.0 lb

## 2022-02-27 DIAGNOSIS — J40 Bronchitis, not specified as acute or chronic: Secondary | ICD-10-CM

## 2022-02-27 MED ORDER — AZITHROMYCIN 500 MG PO TABS: 500.0000 mg | ORAL_TABLET | Freq: Every day | ORAL | 0 refills | Status: DC

## 2022-02-27 NOTE — Progress Notes (Signed)
   Subjective:    Patient ID: Connie [name], female    DOB: 08-Feb-1928, 88 y.o.   MRN: [id number]  HPI Documentation for virtual audio and video telecommunications through [place] encounter: The patient was located at home. 2 patient identifiers used.  The provider was located in the office. The patient did consent to this visit and is aware of possible charges through their insurance for this visit. The other persons participating in this telemedicine service were none. Time spent on call was 5 minutes and in review of previous records >15 minutes total for counseling and coordination of care. This virtual service is not related to other E/M service within previous 7 days.  She states that last Friday she developed a cough but no other symptoms.  It lasted for several days and then slightly got better until 2 days ago when the cough became productive with some slight chills and nasal congestion but no shortness of breath, sore throat, fever.  She does complain some slight ear congestion.  Review of Systems     Objective:   Physical Exam Alert and in no distress otherwise not examined       Assessment & Plan:  Bronchitis - Plan: azithromycin (ZITHROMAX) 500 MG tablet Encouraged her to continue with Mucinex DM during the day and NyQuil at night.  She also is taking Tylenol.  I explained that the medication will work through the rest of the week but if she still having difficulty next Friday, call back.  She expressed understanding of this.

## 2022-03-10 ENCOUNTER — Other Ambulatory Visit: Payer: Self-pay

## 2022-03-10 ENCOUNTER — Other Ambulatory Visit: Payer: Self-pay | Admitting: *Deleted

## 2022-03-10 MED ORDER — AMIODARONE HCL 200 MG PO TABS: 200.0000 mg | ORAL_TABLET | Freq: Every day | ORAL | 3 refills | Status: DC

## 2022-03-10 MED ORDER — DILTIAZEM HCL ER COATED BEADS 240 MG PO CP24: 240.0000 mg | ORAL_CAPSULE | Freq: Every day | ORAL | 3 refills | Status: DC

## 2022-03-23 ENCOUNTER — Other Ambulatory Visit: Payer: Self-pay | Admitting: Family Medicine

## 2022-05-19 ENCOUNTER — Encounter: Payer: Self-pay | Admitting: Medical

## 2022-05-19 ENCOUNTER — Telehealth (INDEPENDENT_AMBULATORY_CARE_PROVIDER_SITE_OTHER): Payer: Medicare HMO | Admitting: Medical

## 2022-05-19 VITALS — BP 164/96 | HR 77 | Wt 156.0 lb

## 2022-05-19 DIAGNOSIS — R058 Other specified cough: Secondary | ICD-10-CM

## 2022-05-19 DIAGNOSIS — J988 Other specified respiratory disorders: Secondary | ICD-10-CM | POA: Diagnosis not present

## 2022-05-19 DIAGNOSIS — Z79899 Other long term (current) drug therapy: Secondary | ICD-10-CM | POA: Diagnosis not present

## 2022-05-19 DIAGNOSIS — Z7901 Long term (current) use of anticoagulants: Secondary | ICD-10-CM | POA: Insufficient documentation

## 2022-05-19 MED ORDER — BENZONATATE 100 MG PO CAPS: 100.0000 mg | ORAL_CAPSULE | Freq: Two times a day (BID) | ORAL | 0 refills | Status: DC | PRN

## 2022-05-19 MED ORDER — GUAIFENESIN ER 600 MG PO TB12: 600.0000 mg | ORAL_TABLET | Freq: Two times a day (BID) | ORAL | 0 refills | Status: DC

## 2022-05-19 MED ORDER — AMOXICILLIN-POT CLAVULANATE 875-125 MG PO TABS: 1.0000 | ORAL_TABLET | Freq: Two times a day (BID) | ORAL | 0 refills | Status: DC

## 2022-05-19 NOTE — Progress Notes (Signed)
Subjective:     Patient ID: Connie [name], female   DOB: [birth year], 89 y.o.   MRN: [id number]  This visit type was conducted due to national recommendations for restrictions regarding the COVID-19 Pandemic (e.g. social distancing) in an effort to limit this patient's exposure and mitigate transmission in our community.  Due to their co-morbid illnesses, this patient is at least at moderate risk for complications without adequate follow up.  This format is felt to be most appropriate for this patient at this time.    Documentation for virtual audio and video telecommunications through [place] encounter:  The patient was located at home. The provider was located in the office. The patient did consent to this visit and is aware of possible charges through their insurance for this visit.  The other persons participating in this telemedicine service were daughter Time spent on call was 20 minutes and in review of previous records 20 minutes total.  This virtual service is not related to other E/M service within previous 7 days.   HPI Chief Complaint  Patient presents with   other    Sick coughing up yellow mucous, no fever, tired and fatigued, no talking much, her husband has pneumonia, weakness,    Virtual for illness.  Accompanied by her daughter today.  She notes having some cough and congestion for at least 2+ weeks and just does not seem to be getting better.  Currently she has cough, getting up dark mucus, sometimes short of breath within a hurry, has some significant fatigue.  But no nausea, no vomiting, no diarrhea, no fever, no wheezing.  No sore throat.  No head pressure.  Is trying to drink plenty of fluids.  Does not feel dehydrated.  Currently using nothing for symptoms.  The symptoms will get a little better then 1 lingering again.  Her husband has had pneumonia in the past week.  He is on hospice.  She thinks she may have gotten some of the symptoms from him to keep this  respiratory tract infection going in her case.  She lives in an independent living facility and is helping take care of her husband.  No other aggravating or relieving factors. No other complaint.  Review of Systems As in subjective    Objective:   Physical Exam Due to coronavirus pandemic stay at home measures, patient visit was virtual and they were not examined in person.   BP (!) 164/96   Pulse 77   Wt 156 lb (70.8 kg)   SpO2 96%   BMI 29.48 kg/m   Gen: wd, wn ,nad, elderly white female Cooperative, pleasant, no wheezing or labored breathing Alert and oriented    Assessment:     Encounter Diagnoses  Name Primary?   Cough productive of purulent sputum Yes   Respiratory tract infection    High risk medication use    Current use of long term anticoagulation        Plan:     We discussed symptoms and concerns.  We discussed limitation of virtual consult.  Offered chest x-ray but she declines at this time.  She will begin the medication below, rest, hydrate well.  We discussed recheck in person or call back if not seeing improvements towards the end of this week.  If not completely resolved within a week we need to come back in for in person consult given her concern for lingering infection.  We discussed that we might want to repeat some blood work if  she continues to not get better.   Ruchel was seen today for other.  Diagnoses and all orders for this visit:  Cough productive of purulent sputum  Respiratory tract infection  High risk medication use  Current use of long term anticoagulation  Other orders -     amoxicillin-clavulanate (AUGMENTIN) 875-125 MG tablet; Take 1 tablet by mouth 2 (two) times daily. -     benzonatate (TESSALON) 100 MG capsule; Take 1 capsule (100 mg total) by mouth 2 (two) times daily as needed for cough. -     guaiFENesin (MUCINEX) 600 MG 12 hr tablet; Take 1 tablet (600 mg total) by mouth 2 (two) times daily.  Follow-up as needed

## 2022-05-27 DIAGNOSIS — L821 Other seborrheic keratosis: Secondary | ICD-10-CM | POA: Diagnosis not present

## 2022-05-27 DIAGNOSIS — B078 Other viral warts: Secondary | ICD-10-CM | POA: Diagnosis not present

## 2022-05-27 DIAGNOSIS — Z85828 Personal history of other malignant neoplasm of skin: Secondary | ICD-10-CM | POA: Diagnosis not present

## 2022-06-08 ENCOUNTER — Encounter: Payer: Self-pay | Admitting: Podiatry

## 2022-06-08 ENCOUNTER — Ambulatory Visit: Payer: Medicare HMO | Admitting: Podiatry

## 2022-06-08 DIAGNOSIS — I739 Peripheral vascular disease, unspecified: Secondary | ICD-10-CM

## 2022-06-08 DIAGNOSIS — L84 Corns and callosities: Secondary | ICD-10-CM

## 2022-06-08 DIAGNOSIS — M79674 Pain in right toe(s): Secondary | ICD-10-CM

## 2022-06-08 DIAGNOSIS — M79675 Pain in left toe(s): Secondary | ICD-10-CM

## 2022-06-08 DIAGNOSIS — B351 Tinea unguium: Secondary | ICD-10-CM | POA: Diagnosis not present

## 2022-06-08 DIAGNOSIS — Q828 Other specified congenital malformations of skin: Secondary | ICD-10-CM | POA: Diagnosis not present

## 2022-06-08 NOTE — Progress Notes (Unsigned)
  Subjective:  Patient ID: Connie [name], female    DOB: [birth year],  MRN: [id number]  Maecie Sevcik Brister presents to clinic today for {[id number]}  Chief Complaint  Patient presents with   Nail Problem    [facility] PCP-[facility] PCP VST-Fall 2023   New problem(s): None.   PCP is Rita Ohara, MD.  Allergies  Allergen Reactions   Cephalexin Itching   Ciprofloxacin Itching   Diclofenac Nausea Only    Upset stomach   Shellfish-Derived Products Nausea And Vomiting   Tape Rash    Review of Systems: Negative except as noted in the HPI.  Objective: No changes noted in today's physical examination.  There were no vitals filed for this visit. Connie [name] is a pleasant 89 y.o. female WD, WN in NAD. AAO x 3.  Vascular Examination: CFT <4 seconds b/l. DP pulses faintly palpable b/l. PT pulses diminished b/l. Skin temperature gradient warm to warm b/l. No ischemia or gangrene. No cyanosis or clubbing noted b/l. Pedal hair sparse. Trace edema noted BLE.   Neurological Examination: Sensation grossly intact b/l with 10 gram monofilament. Vibratory sensation intact b/l.   Dermatological Examination: Pedal skin warm and supple b/l. Toenails 1-5 b/l thick, discolored, elongated with subungual debris and pain on dorsal palpation.    Hyperkeratotic lesion(s) 1st metatarsal head right lower extremity and medial aspect left 2nd PIPJ.  No erythema, no edema, no drainage, no fluctuance.   Porokeratotic lesion(s) submet head 2 left foot. No erythema, no edema, no drainage, no fluctuance.  Musculoskeletal Examination: Normal muscle strength 5/5 to all lower extremity muscle groups bilaterally.   HAV with bunion deformity noted b/l LE.   Hammertoe(s) noted to the bilateral 2nd toes.. No pain, crepitus or joint limitation noted with ROM b/l LE.  Patient ambulates independently without assistive aids.  Radiographs: None  Assessment/Plan: 1. Pain due to onychomycosis of  toenails of both feet   2. Corns and callosities   3. Porokeratosis   4. PAD (peripheral artery disease) (HCC)     {Jgplan:[id number]::"-Patient/POA to call should there be question/concern in the interim."}   Return in about 3 months (around 09/06/2022).  Marzetta Board, DPM

## 2022-06-11 DIAGNOSIS — M2559 Pain in other specified joint: Secondary | ICD-10-CM | POA: Diagnosis not present

## 2022-06-11 DIAGNOSIS — R2681 Unsteadiness on feet: Secondary | ICD-10-CM | POA: Diagnosis not present

## 2022-06-11 DIAGNOSIS — M6281 Muscle weakness (generalized): Secondary | ICD-10-CM | POA: Diagnosis not present

## 2022-06-16 DIAGNOSIS — M6281 Muscle weakness (generalized): Secondary | ICD-10-CM | POA: Diagnosis not present

## 2022-06-16 DIAGNOSIS — M2559 Pain in other specified joint: Secondary | ICD-10-CM | POA: Diagnosis not present

## 2022-06-16 DIAGNOSIS — R2681 Unsteadiness on feet: Secondary | ICD-10-CM | POA: Diagnosis not present

## 2022-06-18 DIAGNOSIS — M6281 Muscle weakness (generalized): Secondary | ICD-10-CM | POA: Diagnosis not present

## 2022-06-18 DIAGNOSIS — M2559 Pain in other specified joint: Secondary | ICD-10-CM | POA: Diagnosis not present

## 2022-06-18 DIAGNOSIS — R2681 Unsteadiness on feet: Secondary | ICD-10-CM | POA: Diagnosis not present

## 2022-06-23 DIAGNOSIS — M6281 Muscle weakness (generalized): Secondary | ICD-10-CM | POA: Diagnosis not present

## 2022-06-23 DIAGNOSIS — R2681 Unsteadiness on feet: Secondary | ICD-10-CM | POA: Diagnosis not present

## 2022-06-23 DIAGNOSIS — M2559 Pain in other specified joint: Secondary | ICD-10-CM | POA: Diagnosis not present

## 2022-06-25 ENCOUNTER — Other Ambulatory Visit: Payer: Self-pay

## 2022-06-25 DIAGNOSIS — I480 Paroxysmal atrial fibrillation: Secondary | ICD-10-CM

## 2022-06-25 MED ORDER — RIVAROXABAN 15 MG PO TABS: ORAL_TABLET | ORAL | 1 refills | Status: DC

## 2022-06-25 NOTE — Telephone Encounter (Signed)
Prescription refill request for Xarelto received.  Indication:  Afib  Last office visit: 01/13/22 Connie [name])  Weight: 70.8kg Age: 88 Scr: 1.21 (02/17/22)  CrCl: 39.14m/min  Appropriate dose. Refill sent.

## 2022-06-30 DIAGNOSIS — R2681 Unsteadiness on feet: Secondary | ICD-10-CM | POA: Diagnosis not present

## 2022-06-30 DIAGNOSIS — M2559 Pain in other specified joint: Secondary | ICD-10-CM | POA: Diagnosis not present

## 2022-06-30 DIAGNOSIS — M6281 Muscle weakness (generalized): Secondary | ICD-10-CM | POA: Diagnosis not present

## 2022-07-07 DIAGNOSIS — R2681 Unsteadiness on feet: Secondary | ICD-10-CM | POA: Diagnosis not present

## 2022-07-07 DIAGNOSIS — M6281 Muscle weakness (generalized): Secondary | ICD-10-CM | POA: Diagnosis not present

## 2022-07-07 DIAGNOSIS — M2559 Pain in other specified joint: Secondary | ICD-10-CM | POA: Diagnosis not present

## 2022-07-09 DIAGNOSIS — R2681 Unsteadiness on feet: Secondary | ICD-10-CM | POA: Diagnosis not present

## 2022-07-09 DIAGNOSIS — M2559 Pain in other specified joint: Secondary | ICD-10-CM | POA: Diagnosis not present

## 2022-07-09 DIAGNOSIS — M6281 Muscle weakness (generalized): Secondary | ICD-10-CM | POA: Diagnosis not present

## 2022-07-14 DIAGNOSIS — M6281 Muscle weakness (generalized): Secondary | ICD-10-CM | POA: Diagnosis not present

## 2022-07-14 DIAGNOSIS — R2681 Unsteadiness on feet: Secondary | ICD-10-CM | POA: Diagnosis not present

## 2022-07-14 DIAGNOSIS — M2559 Pain in other specified joint: Secondary | ICD-10-CM | POA: Diagnosis not present

## 2022-07-16 DIAGNOSIS — M2559 Pain in other specified joint: Secondary | ICD-10-CM | POA: Diagnosis not present

## 2022-07-16 DIAGNOSIS — M6281 Muscle weakness (generalized): Secondary | ICD-10-CM | POA: Diagnosis not present

## 2022-07-16 DIAGNOSIS — R2681 Unsteadiness on feet: Secondary | ICD-10-CM | POA: Diagnosis not present

## 2022-07-17 DIAGNOSIS — H43813 Vitreous degeneration, bilateral: Secondary | ICD-10-CM | POA: Diagnosis not present

## 2022-07-21 DIAGNOSIS — M2559 Pain in other specified joint: Secondary | ICD-10-CM | POA: Diagnosis not present

## 2022-07-21 DIAGNOSIS — M6281 Muscle weakness (generalized): Secondary | ICD-10-CM | POA: Diagnosis not present

## 2022-07-21 DIAGNOSIS — R2681 Unsteadiness on feet: Secondary | ICD-10-CM | POA: Diagnosis not present

## 2022-07-25 IMAGING — DX DG CHEST 2V
2 series · 2 of 2 positions shown · non-contrast
Comparison: 07/04/2015

CLINICAL DATA: Cough

EXAM:
CHEST - 2 VIEW

[chest pa]
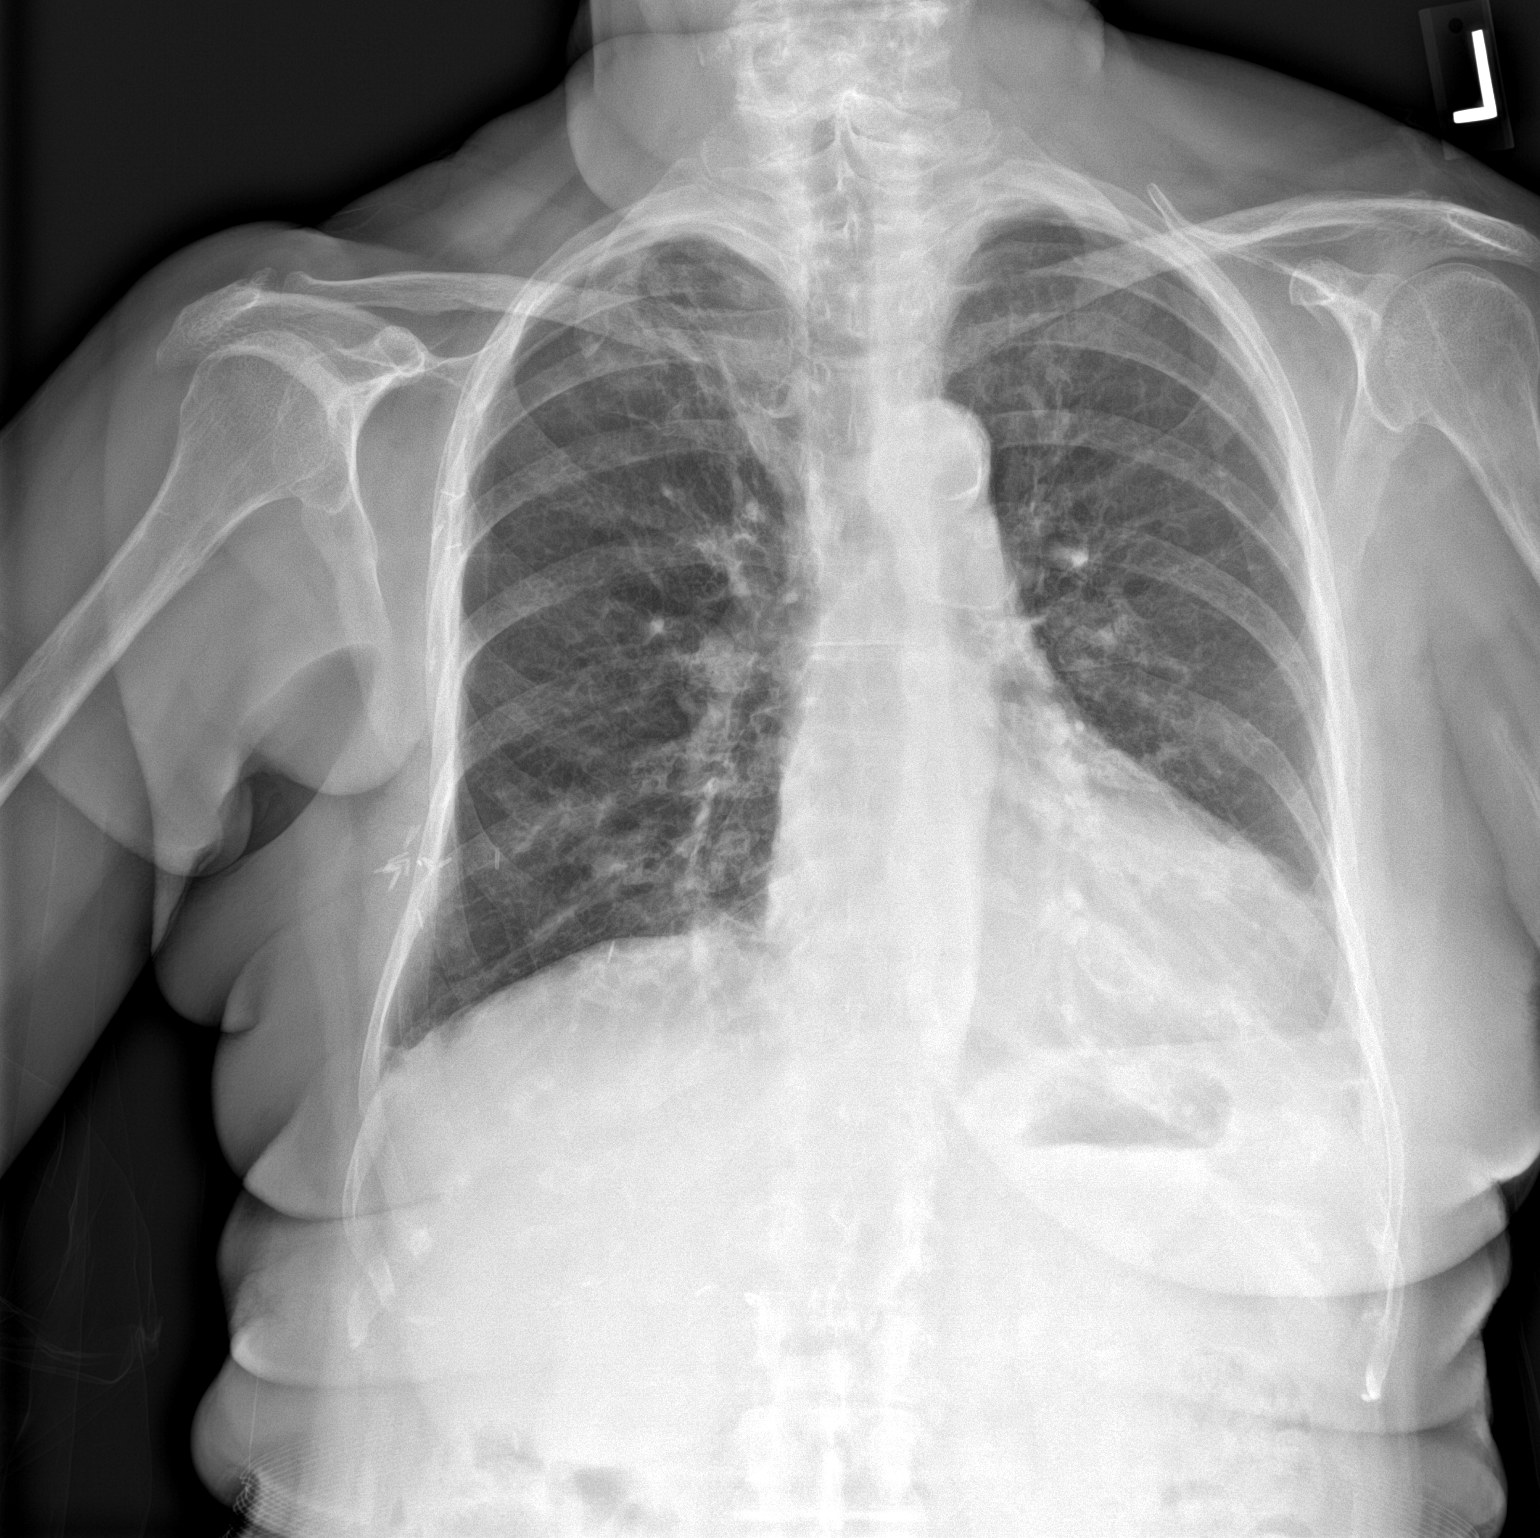

[chest lat]
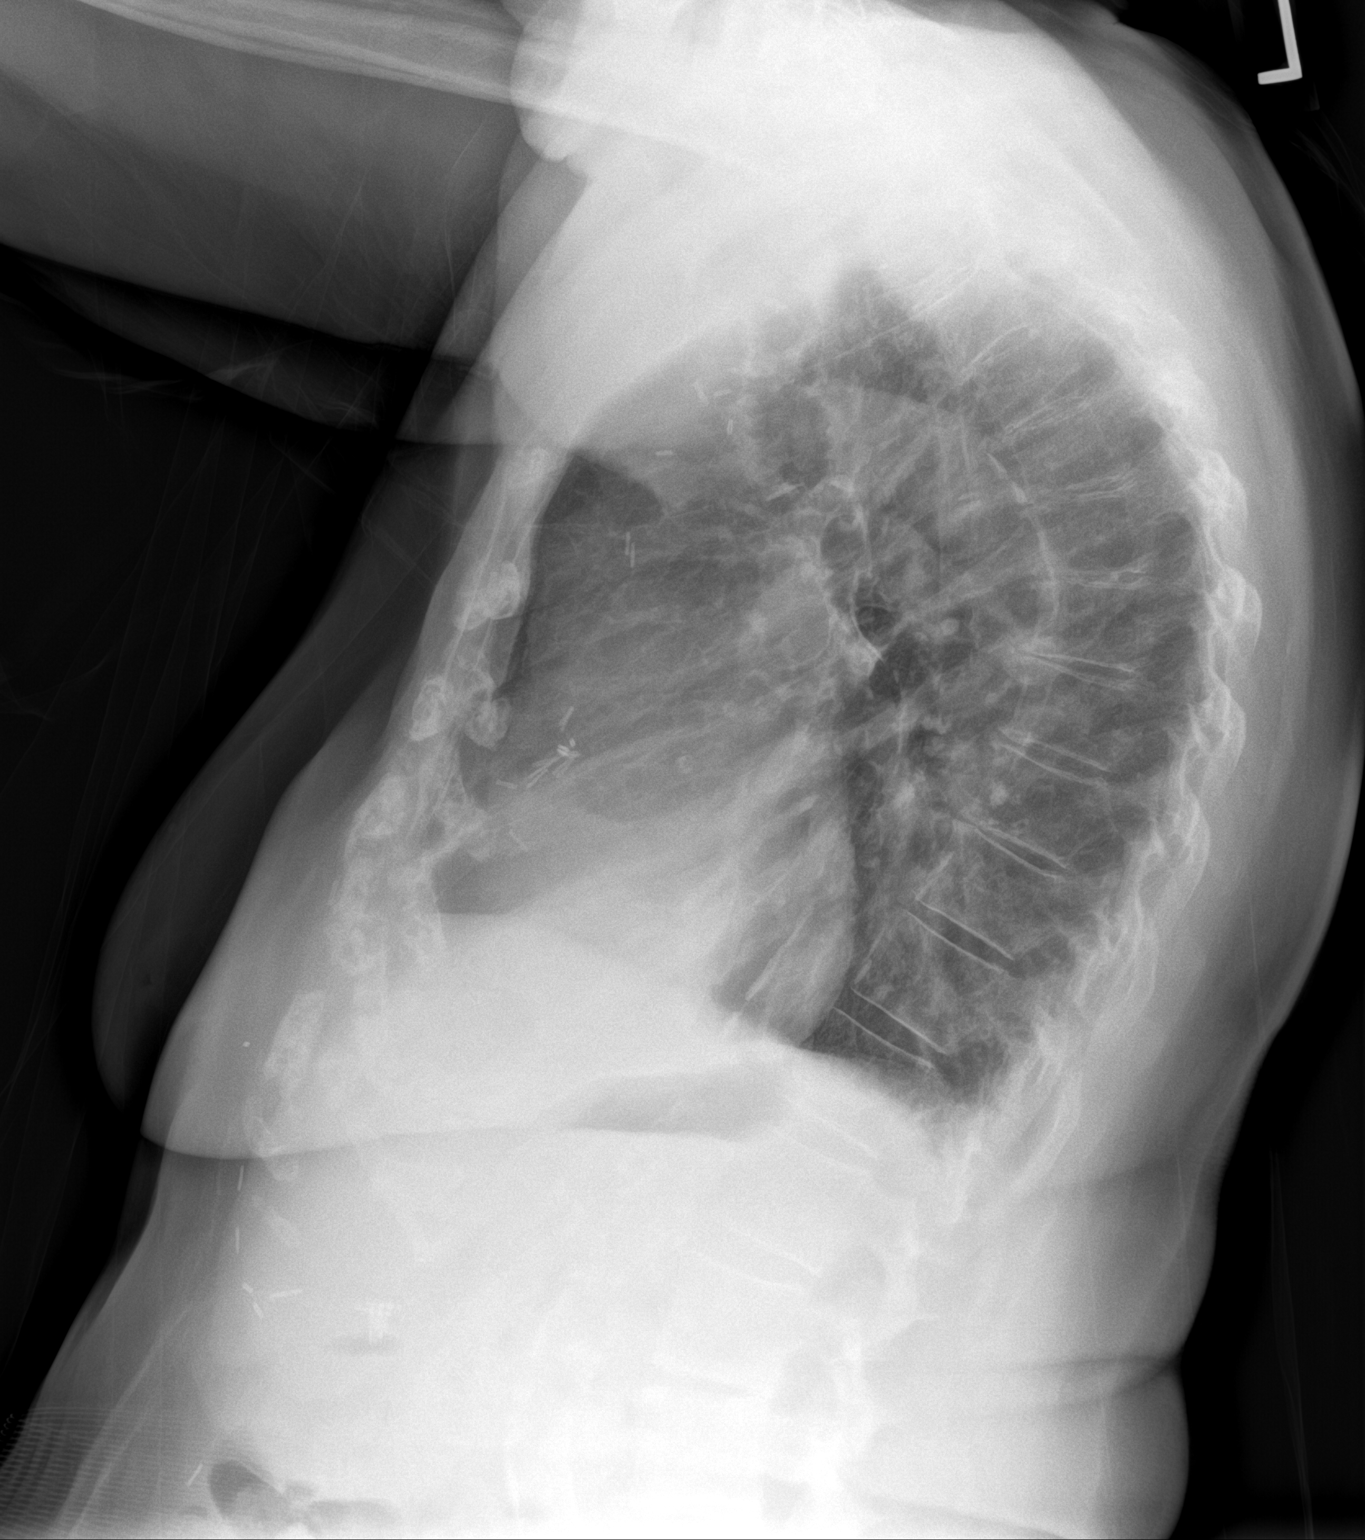

[2 of 2 positions shown; findings below may reference images not displayed]

FINDINGS: Cardiac shadow is within normal limits. Aortic calcifications are
noted. The lungs are well aerated without focal infiltrate or
sizable effusion. Postsurgical changes in the right axilla are
noted. Right mastectomy is seen. No acute bony abnormality is noted.
IMPRESSION: No acute abnormality noted.

## 2022-07-28 DIAGNOSIS — M2559 Pain in other specified joint: Secondary | ICD-10-CM | POA: Diagnosis not present

## 2022-07-28 DIAGNOSIS — M6281 Muscle weakness (generalized): Secondary | ICD-10-CM | POA: Diagnosis not present

## 2022-07-28 DIAGNOSIS — R2681 Unsteadiness on feet: Secondary | ICD-10-CM | POA: Diagnosis not present

## 2022-07-30 DIAGNOSIS — M2559 Pain in other specified joint: Secondary | ICD-10-CM | POA: Diagnosis not present

## 2022-07-30 DIAGNOSIS — R2681 Unsteadiness on feet: Secondary | ICD-10-CM | POA: Diagnosis not present

## 2022-07-30 DIAGNOSIS — M6281 Muscle weakness (generalized): Secondary | ICD-10-CM | POA: Diagnosis not present

## 2022-08-04 DIAGNOSIS — R2681 Unsteadiness on feet: Secondary | ICD-10-CM | POA: Diagnosis not present

## 2022-08-04 DIAGNOSIS — M2559 Pain in other specified joint: Secondary | ICD-10-CM | POA: Diagnosis not present

## 2022-08-04 DIAGNOSIS — M6281 Muscle weakness (generalized): Secondary | ICD-10-CM | POA: Diagnosis not present

## 2022-08-06 DIAGNOSIS — M2559 Pain in other specified joint: Secondary | ICD-10-CM | POA: Diagnosis not present

## 2022-08-06 DIAGNOSIS — M6281 Muscle weakness (generalized): Secondary | ICD-10-CM | POA: Diagnosis not present

## 2022-08-06 DIAGNOSIS — R2681 Unsteadiness on feet: Secondary | ICD-10-CM | POA: Diagnosis not present

## 2022-08-11 DIAGNOSIS — M6281 Muscle weakness (generalized): Secondary | ICD-10-CM | POA: Diagnosis not present

## 2022-08-11 DIAGNOSIS — M2559 Pain in other specified joint: Secondary | ICD-10-CM | POA: Diagnosis not present

## 2022-08-11 DIAGNOSIS — R2681 Unsteadiness on feet: Secondary | ICD-10-CM | POA: Diagnosis not present

## 2022-08-13 DIAGNOSIS — M2559 Pain in other specified joint: Secondary | ICD-10-CM | POA: Diagnosis not present

## 2022-08-13 DIAGNOSIS — M6281 Muscle weakness (generalized): Secondary | ICD-10-CM | POA: Diagnosis not present

## 2022-08-13 DIAGNOSIS — R2681 Unsteadiness on feet: Secondary | ICD-10-CM | POA: Diagnosis not present

## 2022-08-20 DIAGNOSIS — R2681 Unsteadiness on feet: Secondary | ICD-10-CM | POA: Diagnosis not present

## 2022-08-20 DIAGNOSIS — M6281 Muscle weakness (generalized): Secondary | ICD-10-CM | POA: Diagnosis not present

## 2022-08-20 DIAGNOSIS — M2559 Pain in other specified joint: Secondary | ICD-10-CM | POA: Diagnosis not present

## 2022-08-25 DIAGNOSIS — M6281 Muscle weakness (generalized): Secondary | ICD-10-CM | POA: Diagnosis not present

## 2022-08-25 DIAGNOSIS — R2681 Unsteadiness on feet: Secondary | ICD-10-CM | POA: Diagnosis not present

## 2022-08-25 DIAGNOSIS — M2559 Pain in other specified joint: Secondary | ICD-10-CM | POA: Diagnosis not present

## 2022-08-27 DIAGNOSIS — M2559 Pain in other specified joint: Secondary | ICD-10-CM | POA: Diagnosis not present

## 2022-08-27 DIAGNOSIS — R2681 Unsteadiness on feet: Secondary | ICD-10-CM | POA: Diagnosis not present

## 2022-08-27 DIAGNOSIS — M6281 Muscle weakness (generalized): Secondary | ICD-10-CM | POA: Diagnosis not present

## 2022-09-01 DIAGNOSIS — M2559 Pain in other specified joint: Secondary | ICD-10-CM | POA: Diagnosis not present

## 2022-09-01 DIAGNOSIS — R2681 Unsteadiness on feet: Secondary | ICD-10-CM | POA: Diagnosis not present

## 2022-09-01 DIAGNOSIS — M6281 Muscle weakness (generalized): Secondary | ICD-10-CM | POA: Diagnosis not present

## 2022-09-01 NOTE — Progress Notes (Unsigned)
No chief complaint on file.  Connie [name] is a 89 y.o. female who presents for annual physical exam, Medicare wellness visit and follow-up on chronic medical conditions.   She has the following concerns:   She was sick in January and saw [facility]. She had a respiratory illness (her husband had pneumonia around that time), and was treated with augmentin and benzonatate. Symptoms resolved.  At her last couple of visits she had reported some LBP, stiffness. She never had any numbness, tingling or weakness in LE's. Known to have advanced DDD at L4-L5 and L5-S1 (XR 05/2020) She got PT through [facility] during the month of February. UPDATE  Improved??  Husband remains on hospice and palliative care for CHF. He is declining, but has a lot of support.  He walks around the apartment some, sleeps more. Uses motorized WC to get to dining room.  Uses oxygen (not all the time).   Hyperlipidemia:  Compliant with 40mg  of pravastatin without side effects.  She didn't tolerate fish oil in the past (due to belching), and not taking Lovaza (d/t insurance issue) or Vascepa (did not tolerate due to acid reflux, even just 1 capsule daily); TG was 152 on this regimen on last check. At that time she reported occasional fried fish, desserts 2x/week. She denies any significant dietary changes.  Aortic atherosclerosis was noted (calcifications) on CXR 06/2021. Lab Results  Component Value Date   CHOL 158 02/17/2022   HDL 50 02/17/2022   LDLCALC 82 02/17/2022   TRIG 152 (H) 02/17/2022   CHOLHDL 3.2 02/17/2022    Urge urinary incontinence--Taking Detrol LA 4mg .  Most of the time it works well. Occasionally has a night (about once a month) where she is up every 2 hours at night (no frequency during the day).  She usually gets up just 2-3x/night. Denies any dysuria, odor or hematuria. She wears Poise pads daily, due to the urgency and incontinence if she is not close enough to the bathroom.. She does report  some intermittent skin irritation in the vaginal/perineal area.    Pre-diabetes--She admits she enjoys good breads and ice cream. She tries to limit her portions. Not having as many sandwiches.  At her last visit, she had 2 donuts in 1 day.  Lab Results  Component Value Date   HGBA1C 6.0 (H) 02/17/2022       Atrial fibrillation:  She is under the care of cardiology (previously Dr. Katrinka Blazing), and rhythm is controlled with amiodarone. She continues on diltiazem 240mg  and Xarelto. She denies any significant bleeding.  Pt has HTN, and had been on diltiazem 240 and HCTZ.  HCTZ was stopped in 01/2022 due to Cr going up to 1.75 (suspected pre-renal component). Cr improved on recheck. She has remained off HCTZ. BP's have been running ***   Lab Results  Component Value Date   CREATININE 1.21 (H) 02/17/2022   BP Readings from Last 3 Encounters:  05/19/22 (!) 164/96  02/19/22 130/60  01/13/22 (!) 122/58    Gout:  She continues to do well with 200mg  of allopurinol, without any gout flares (great toe).  She uses Tylenol Arthritis 2 BID, which controls her pain (feet, hands, back). Lab Results  Component Value Date   LABURIC 5.1 02/17/2022    Immunization History  Administered Date(s) Administered   COVID-19, mRNA, vaccine(Comirnaty)12 years and older 02/19/2022   Fluad Quad(high Dose 65+) 01/04/2019, 01/11/2020, 02/12/2021, 02/19/2022   Hepatitis A 11/06/1995, 08/25/1996   IPV 11/06/1995   Influenza Split  03/12/2009, 02/02/2011, 02/02/2012, 01/19/2013   Influenza, High Dose Seasonal PF 02/24/2016, 02/01/2017, 02/17/2018   Influenza-Unspecified 01/16/2014, 02/02/2015   Meningococcal polysaccharide vaccine (MPSV4) 11/06/1995   Moderna Covid-19 Vaccine Bivalent Booster 83yrs & up 06/14/2021   Moderna Sars-Covid-2 Vaccination 05/08/2019, 06/05/2019, 03/12/2020, 09/25/2020   Pneumococcal Conjugate-13 05/01/2013   Pneumococcal Polysaccharide-23 11/06/1995, 06/04/2005, 06/15/2016   Tdap  11/06/1995, 06/04/2005, 06/16/2013, 12/06/2014   Typhoid Live 11/15/1995, 04/29/2010, 06/10/2015   Yellow Fever 04/04/2012   Zoster Recombinat (Shingrix) 08/20/2017, 11/18/2017   Zoster, Live 06/04/2005   Last Pap smear: 6/09   Last mammogram: 03/2019 Last colonoscopy: 12/2008   Last DEXA: 09/2015  T-1.1 L fem neck Dentist: regular, 1-2 times/year (every 9 months) Ophtho: yearly  Exercise:  Walks 1/2 mile daily (to/from meals.)    Patient Care Team: Joselyn Arrow, MD as PCP - General (Family Medicine) Lyn Records, MD (Inactive) as PCP - Cardiology (Cardiology) [place], Heather, OD (Optometry) Bufford Buttner, MD as Referring Physician (Dermatology) Charolett Bumpers, MD (Gastroenterology) Asencion Islam, DPM as Consulting Physician (Podiatry) Hillis Range, MD (Inactive) as Consulting Physician (Cardiology)  Dentist: Dr. Lennice Sites Oncologist: Dr. Welton Flakes (she has been released from her care) Podiatrist: Dr. Eloy End   Depression Screening: Flowsheet Row Office Visit from 07/30/2021 in [facility]  PHQ-2 Total Score 0        Falls screen:     07/30/2021    1:48 PM 02/12/2021    2:40 PM 07/29/2020    2:47 PM 07/12/2019    1:31 PM 07/04/2018    2:34 PM  Fall Risk   Falls in the past year? 1 0 0 1 0  Number falls in past yr: 1 0  0   Comment Oct 2022 x 2      Injury with Fall? 1 0  1   Comment bruising   at [facility] in January and injured her lower legs/contusions and bruising   Risk for fall due to : History of fall(s) No Fall Risks     Follow up Falls evaluation completed Falls evaluation completed       Larey Seat when she was sick with COVID ("a little groggy all the time"). None since then.  Functional Status Survey:          End of Life Discussion:  Patient has a living will and medical power of attorney (in chart) She signed DNR in 07/2021.  We voided her last MOST form, she took a new one to discuss with her daughter Connie [name] (who is a physician). UPDATE  PMH, PSH,  SH and FH were reviewed and updated     ROS: The patient denies anorexia, fever, weight changes, headaches, vision changes, ear pain, sore throat, breast concerns, chest pain, dizziness, syncope, dyspnea on exertion, cough, swelling, nausea, vomiting, diarrhea, melena, hematochezia, indigestion/heartburn (infrequent, per HPI), hematuria, dysuria, vaginal bleeding, discharge, odor or itch, genital lesions, weakness, tremor, suspicious skin lesions, depression, anxiety, abnormal bleeding/bruising, or enlarged lymph nodes.   +urge urinary incontinence (see HPI). +joint pains in hands/fingers/feet--treated with tylenol, per HPI +hearing loss--has hearing aids, working well Constipation is improved. Eats a small box of raisins if she gets constipated, with good results. No longer needs miralax or metamucil. Some low back pain and RLE issues per HPI.   PHYSICAL EXAM:  There were no vitals taken for this visit.   Wt Readings from Last 3 Encounters:  05/19/22 156 lb (70.8 kg)  02/27/22 156 lb (70.8 kg)  02/19/22 156 lb 11.2 oz (71.1 kg)  General Appearance:    Alert, cooperative, no distress, appears stated age     Head:    Normocephalic, without obvious abnormality, atraumatic     Eyes:    PERRL, conjunctiva/corneas clear, EOM's intact, fundi benign     Ears:    Normal TM's and EACs.   Nose:    Normal, no drainage  Throat:    Clear, no erythema or lesions     Neck:    Supple, no lymphadenopathy; thyroid: no enlargement/ tenderness/nodules; no carotid bruit or JVD     Back:    No spinal or CVA tenderness.   Lungs:    Clear to auscultation bilaterally without wheezes, rales or ronchi; respirations unlabored     Chest Wall:    No tenderness or deformity     Heart:    Regular rate and rhythm, S1 and S2 normal, no murmur, rub or gallop.   Breast Exam:    R breast absent, well healed surgical scars, nontender. Egg-sized soft tissue, cystic-swelling at proximal part of right pectoralis tendon  (below proximal humerus), soft, mobile, nontender (approx 3cm)--this is just proximal to where her compression sleeve starts.  This is unchanged.  L breast normal-- no masses or nipple discharge. nipple is mildly inverted, chronic. No axillary lymphadenopathy     Abdomen:    Soft, non-tender, nondistended, normoactive bowel sounds, no masses, no hepatosplenomegaly.   Genitalia:    Exam declined by patient.   Rectal:    Exam declined by patient   Extremities:    No clubbing, cyanosis or edema. Hyperpigmentation and scarring from prior injury at R shin  Pulses:    2+ and symmetric all extremities     Skin:    Skin color, texture, turgor normal.  Many angiomas on trunk   Lymph nodes:    Cervical, supraclavicular and axillary nodes normal     Neurologic:    Normal strength, sensation and gait; Negative SLR; Reflexes 2+ and symmetric throughout                         Psych:   Normal mood, affect, hygiene and grooming  UPDATE BREAST, skin (R shin scarring)  ASSESSMENT/PLAN:  No pelvic exam, needs breast exam.  A1c Cbc, c-met  Did she get RSV vaccine? COVID booster? Prevnar-20? (Not both at one time) I think she now sees Dr. Eloy End, not Dr. Marylene Land (podiatrist)?  Last year she took blank MOST form, to d/w daughter Connie [name].  We signed DNR forms in 07/2021, but voided last MOST form, need new one.  RF allopurinol x 6 mos, detrol x 1 year  Discussed monthly self breast exams and yearly mammograms (past due, discussed); at least 30 minutes of aerobic activity at least 5 days/week; weight-bearing exercise at least 2x/wk; proper sunscreen use reviewed; healthy diet, including goals of calcium and vitamin D intake and alcohol recommendations (less than or equal to 1 drink/day) reviewed; regular seatbelt use; changing batteries in smoke detectors. Immunization recommendations discussed--continue yearly high dose flu shots.  RSV vaccine in the fall. COVID booster Prevnar-20 Colonoscopy recommendations  reviewed--last done 12/2008. F/u not needed due to age, unless problems.   Pap smear not indicated due to age and low risk.  Marland Kitchen   MOST form was reviewed. Changing to DNR, but wants to discuss with daughter Connie [name] the other specifics.  UPDATE  F/u 6 month med check with fasting labs prior--CBC, c-met, lipid, uric acid, A1c, TSH ENTER FUTURE ORDERS  IF SHE WANTS TO COME PRIOR    Medicare Attestation I have personally reviewed: The patient's medical and social history Their use of alcohol, tobacco or illicit drugs Their current medications and supplements The patient's functional ability including ADLs,fall risks, home safety risks, cognitive, and hearing and visual impairment Diet and physical activities Evidence for depression or mood disorders  The patient's weight, height, BMI, and visual acuity have been recorded in the chart.  I have made referrals, counseling, and provided education to the patient based on review of the above and I have provided the patient with a written personalized care plan for preventive services.

## 2022-09-01 NOTE — Patient Instructions (Incomplete)
  HEALTH MAINTENANCE RECOMMENDATIONS:  It is recommended that you get at least 30 minutes of aerobic exercise at least 5 days/week (for weight loss, you may need as much as 60-90 minutes). This can be any activity that gets your heart rate up. This can be divided in 10-15 minute intervals if needed, but try and build up your endurance at least once a week.  Weight bearing exercise is also recommended twice weekly.  Eat a healthy diet with lots of vegetables, fruits and fiber.  "Colorful" foods have a lot of vitamins (ie green vegetables, tomatoes, red peppers, etc).  Limit sweet tea, regular sodas and alcoholic beverages, all of which has a lot of calories and sugar.  Up to 1 alcoholic drink daily may be beneficial for women (unless trying to lose weight, watch sugars).  Drink a lot of water.  Calcium recommendations are 1200-1500 mg daily (1500 mg for postmenopausal women or women without ovaries), and vitamin D 1000 IU daily.  This should be obtained from diet and/or supplements (vitamins), and calcium should not be taken all at once, but in divided doses.  Monthly self breast exams and yearly mammograms for women over the age of 58 is recommended.  Sunscreen of at least SPF 30 should be used on all sun-exposed parts of the skin when outside between the hours of 10 am and 4 pm (not just when at beach or pool, but even with exercise, golf, tennis, and yard work!)  Use a sunscreen that says "broad spectrum" so it covers both UVA and UVB rays, and make sure to reapply every 1-2 hours.  Remember to change the batteries in your smoke detectors when changing your clock times in the spring and fall. Carbon monoxide detectors are recommended for your home.  Use your seat belt every time you are in a car, and please drive safely and not be distracted with cell phones and texting while driving.   Connie [name] , Thank you for taking time to come for your Medicare Wellness Visit. I appreciate your ongoing  commitment to your health goals. Please review the following plan we discussed and let me know if I can assist you in the future.   This is a list of the screening recommended for you and due dates:  Health Maintenance  Topic Date Due   COVID-19 Vaccine (7 - 2023-24 season) 04/16/2022   Flu Shot  12/03/2022   Medicare Annual Wellness Visit  09/02/2023   DTaP/Tdap/Td vaccine (5 - Td or Tdap) 12/05/2024   Pneumonia Vaccine  Completed   DEXA scan (bone density measurement)  Completed   Zoster (Shingles) Vaccine  Completed   HPV Vaccine  Aged Out   RSV vaccine is recommended in the Fall. You can get this the same day as your high dose flu shot, or separate by 2 weeks.  COVID booster  Prevnar-20  It is up to you whether or not you would like to continue with regular mammograms to screen for breast cancer.  You have a history of breast cancer, so yearly mammograms are usually recommended, but at this age it really becomes your choice, understanding that you could miss earlier detection of a new cancer without one.

## 2022-09-02 ENCOUNTER — Encounter: Payer: Self-pay | Admitting: Family Medicine

## 2022-09-02 ENCOUNTER — Ambulatory Visit (INDEPENDENT_AMBULATORY_CARE_PROVIDER_SITE_OTHER): Payer: Medicare HMO | Admitting: Family Medicine

## 2022-09-02 VITALS — BP 120/60 | HR 64 | Ht 59.5 in | Wt 152.8 lb

## 2022-09-02 DIAGNOSIS — N1832 Chronic kidney disease, stage 3b: Secondary | ICD-10-CM | POA: Diagnosis not present

## 2022-09-02 DIAGNOSIS — E6609 Other obesity due to excess calories: Secondary | ICD-10-CM

## 2022-09-02 DIAGNOSIS — Z5181 Encounter for therapeutic drug level monitoring: Secondary | ICD-10-CM

## 2022-09-02 DIAGNOSIS — I480 Paroxysmal atrial fibrillation: Secondary | ICD-10-CM

## 2022-09-02 DIAGNOSIS — I700 Atherosclerosis of aorta: Secondary | ICD-10-CM

## 2022-09-02 DIAGNOSIS — R7303 Prediabetes: Secondary | ICD-10-CM

## 2022-09-02 DIAGNOSIS — M159 Polyosteoarthritis, unspecified: Secondary | ICD-10-CM

## 2022-09-02 DIAGNOSIS — Z7901 Long term (current) use of anticoagulants: Secondary | ICD-10-CM | POA: Diagnosis not present

## 2022-09-02 DIAGNOSIS — Z23 Encounter for immunization: Secondary | ICD-10-CM | POA: Diagnosis not present

## 2022-09-02 DIAGNOSIS — D6869 Other thrombophilia: Secondary | ICD-10-CM

## 2022-09-02 DIAGNOSIS — M109 Gout, unspecified: Secondary | ICD-10-CM | POA: Diagnosis not present

## 2022-09-02 DIAGNOSIS — Z0000 Encounter for general adult medical examination without abnormal findings: Secondary | ICD-10-CM | POA: Diagnosis not present

## 2022-09-02 DIAGNOSIS — N3941 Urge incontinence: Secondary | ICD-10-CM | POA: Diagnosis not present

## 2022-09-02 DIAGNOSIS — Z7189 Other specified counseling: Secondary | ICD-10-CM

## 2022-09-02 DIAGNOSIS — E782 Mixed hyperlipidemia: Secondary | ICD-10-CM

## 2022-09-02 DIAGNOSIS — I10 Essential (primary) hypertension: Secondary | ICD-10-CM | POA: Diagnosis not present

## 2022-09-02 DIAGNOSIS — Z853 Personal history of malignant neoplasm of breast: Secondary | ICD-10-CM

## 2022-09-02 LAB — POCT GLYCOSYLATED HEMOGLOBIN (HGB A1C): Hemoglobin A1C: 5.5 % (ref 4.0–5.6)

## 2022-09-02 LAB — CBC WITH DIFFERENTIAL/PLATELET
Basophils Absolute: 0.1 10*3/uL (ref 0.0–0.2)
EOS (ABSOLUTE): 0.2 10*3/uL (ref 0.0–0.4)
Immature Granulocytes: 0 %
Monocytes: 8 %
Neutrophils Absolute: 4.4 10*3/uL (ref 1.4–7.0)

## 2022-09-02 MED ORDER — ALLOPURINOL 100 MG PO TABS: 200.0000 mg | ORAL_TABLET | Freq: Every day | ORAL | 1 refills | Status: DC

## 2022-09-02 MED ORDER — MIRABEGRON ER 25 MG PO TB24
25.0000 mg | ORAL_TABLET | Freq: Every day | ORAL | 1 refills | Status: DC
Start: 2022-09-02 — End: 2022-10-29

## 2022-09-03 DIAGNOSIS — M6281 Muscle weakness (generalized): Secondary | ICD-10-CM | POA: Diagnosis not present

## 2022-09-03 DIAGNOSIS — R2681 Unsteadiness on feet: Secondary | ICD-10-CM | POA: Diagnosis not present

## 2022-09-03 DIAGNOSIS — M2559 Pain in other specified joint: Secondary | ICD-10-CM | POA: Diagnosis not present

## 2022-09-03 LAB — COMPREHENSIVE METABOLIC PANEL
ALT: 15 IU/L (ref 0–32)
AST: 20 IU/L (ref 0–40)
Albumin/Globulin Ratio: 1.9 (ref 1.2–2.2)
Albumin: 4.1 g/dL (ref 3.6–4.6)
Alkaline Phosphatase: 111 IU/L (ref 44–121)
BUN/Creatinine Ratio: 23 (ref 12–28)
BUN: 31 mg/dL (ref 10–36)
Bilirubin Total: 0.3 mg/dL (ref 0.0–1.2)
CO2: 24 mmol/L (ref 20–29)
Calcium: 9.5 mg/dL (ref 8.7–10.3)
Chloride: 103 mmol/L (ref 96–106)
Creatinine, Ser: 1.35 mg/dL — ABNORMAL HIGH (ref 0.57–1.00)
Globulin, Total: 2.2 g/dL (ref 1.5–4.5)
Glucose: 96 mg/dL (ref 70–99)
Potassium: 4.4 mmol/L (ref 3.5–5.2)
Sodium: 142 mmol/L (ref 134–144)
Total Protein: 6.3 g/dL (ref 6.0–8.5)
eGFR: 37 mL/min/{1.73_m2} — ABNORMAL LOW (ref >59)

## 2022-09-03 LAB — CBC WITH DIFFERENTIAL/PLATELET
Basos: 1 %
Eos: 2 %
Hematocrit: 37.4 % (ref 34.0–46.6)
Hemoglobin: 12.5 g/dL (ref 11.1–15.9)
Immature Grans (Abs): 0 10*3/uL (ref 0.0–0.1)
Lymphocytes Absolute: 2 10*3/uL (ref 0.7–3.1)
Lymphs: 28 %
MCH: 31.6 pg (ref 26.6–33.0)
MCHC: 33.4 g/dL (ref 31.5–35.7)
MCV: 95 fL (ref 79–97)
Monocytes Absolute: 0.6 10*3/uL (ref 0.1–0.9)
Neutrophils: 61 %
Platelets: 179 10*3/uL (ref 150–450)
RBC: 3.95 x10E6/uL (ref 3.77–5.28)
RDW: 12.8 % (ref 11.7–15.4)
WBC: 7.3 10*3/uL (ref 3.4–10.8)

## 2022-09-08 DIAGNOSIS — R2681 Unsteadiness on feet: Secondary | ICD-10-CM | POA: Diagnosis not present

## 2022-09-08 DIAGNOSIS — M2559 Pain in other specified joint: Secondary | ICD-10-CM | POA: Diagnosis not present

## 2022-09-08 DIAGNOSIS — M6281 Muscle weakness (generalized): Secondary | ICD-10-CM | POA: Diagnosis not present

## 2022-09-15 DIAGNOSIS — R2681 Unsteadiness on feet: Secondary | ICD-10-CM | POA: Diagnosis not present

## 2022-09-15 DIAGNOSIS — M2559 Pain in other specified joint: Secondary | ICD-10-CM | POA: Diagnosis not present

## 2022-09-15 DIAGNOSIS — M6281 Muscle weakness (generalized): Secondary | ICD-10-CM | POA: Diagnosis not present

## 2022-09-17 DIAGNOSIS — R2681 Unsteadiness on feet: Secondary | ICD-10-CM | POA: Diagnosis not present

## 2022-09-17 DIAGNOSIS — M2559 Pain in other specified joint: Secondary | ICD-10-CM | POA: Diagnosis not present

## 2022-09-17 DIAGNOSIS — M6281 Muscle weakness (generalized): Secondary | ICD-10-CM | POA: Diagnosis not present

## 2022-09-22 ENCOUNTER — Encounter: Payer: Self-pay | Admitting: Podiatry

## 2022-09-22 ENCOUNTER — Ambulatory Visit: Payer: Medicare HMO | Admitting: Podiatry

## 2022-09-22 DIAGNOSIS — M79674 Pain in right toe(s): Secondary | ICD-10-CM

## 2022-09-22 DIAGNOSIS — I739 Peripheral vascular disease, unspecified: Secondary | ICD-10-CM

## 2022-09-22 DIAGNOSIS — M79675 Pain in left toe(s): Secondary | ICD-10-CM

## 2022-09-22 DIAGNOSIS — B351 Tinea unguium: Secondary | ICD-10-CM

## 2022-09-23 DIAGNOSIS — R2681 Unsteadiness on feet: Secondary | ICD-10-CM | POA: Diagnosis not present

## 2022-09-23 DIAGNOSIS — M2559 Pain in other specified joint: Secondary | ICD-10-CM | POA: Diagnosis not present

## 2022-09-23 DIAGNOSIS — M6281 Muscle weakness (generalized): Secondary | ICD-10-CM | POA: Diagnosis not present

## 2022-09-27 NOTE — Progress Notes (Signed)
  Subjective:  Patient ID: Connie [name], female    DOB: [birth year],  MRN: [id number]  Connie [name] presents to clinic today for painful elongated mycotic toenails 1-5 bilaterally which are tender when wearing enclosed shoe gear. Pain is relieved with periodic professional debridement. Patient states her husband remains on hospice services. Her daughter accompanies both of them on today's visit. Chief Complaint  Patient presents with   Nail Problem    RFC PCP-Knapp PCP VST-4 weeks ago   New problem(s): None.   PCP is Joselyn Arrow, MD.  Allergies  Allergen Reactions   Cephalexin Itching   Ciprofloxacin Itching   Diclofenac Nausea Only    Upset stomach   Shellfish-Derived Products Nausea And Vomiting   Tape Rash    Review of Systems: Negative except as noted in the HPI.  Objective: No changes noted in today's physical examination. There were no vitals filed for this visit. Connie [name] is a pleasant 89 y.o. female WD, WN in NAD. AAO x 3.  Vascular Examination: CFT <4 seconds b/l. DP pulses faintly palpable b/l. PT pulses diminished b/l. Skin temperature gradient warm to warm b/l. No ischemia or gangrene. No cyanosis or clubbing noted b/l. Pedal hair sparse. Trace edema noted BLE.   Neurological Examination: Sensation grossly intact b/l with 10 gram monofilament. Vibratory sensation intact b/l.   Dermatological Examination: Pedal skin warm and supple b/l. Toenails 1-5 b/l thick, discolored, elongated with subungual debris and pain on dorsal palpation.    Musculoskeletal Examination: Normal muscle strength 5/5 to all lower extremity muscle groups bilaterally.   HAV with bunion deformity noted b/l LE.   Hammertoe(s) noted to the bilateral 2nd toes.. No pain, crepitus or joint limitation noted with ROM b/l LE.  Patient ambulates independently without assistive aids.  Radiographs: None  Assessment/Plan: 1. Pain due to onychomycosis of toenails of  both feet   2. PAD (peripheral artery disease) (HCC)    -Consent given for treatment as described below: -Examined patient. -Patient instructed to continue use of tea tree oil with cotton tipped applicator to affected toenails once daily. -Mycotic toenails 1-5 bilaterally were debrided in length and girth with sterile nail nippers and dremel without incident. -Patient/POA to call should there be question/concern in the interim.   Return in about 10 weeks (around 12/01/2022).  Freddie Breech, DPM

## 2022-10-01 DIAGNOSIS — M2559 Pain in other specified joint: Secondary | ICD-10-CM | POA: Diagnosis not present

## 2022-10-01 DIAGNOSIS — R2681 Unsteadiness on feet: Secondary | ICD-10-CM | POA: Diagnosis not present

## 2022-10-01 DIAGNOSIS — M6281 Muscle weakness (generalized): Secondary | ICD-10-CM | POA: Diagnosis not present

## 2022-10-06 DIAGNOSIS — M2559 Pain in other specified joint: Secondary | ICD-10-CM | POA: Diagnosis not present

## 2022-10-06 DIAGNOSIS — R2681 Unsteadiness on feet: Secondary | ICD-10-CM | POA: Diagnosis not present

## 2022-10-06 DIAGNOSIS — M6281 Muscle weakness (generalized): Secondary | ICD-10-CM | POA: Diagnosis not present

## 2022-10-15 DIAGNOSIS — M6281 Muscle weakness (generalized): Secondary | ICD-10-CM | POA: Diagnosis not present

## 2022-10-15 DIAGNOSIS — M2559 Pain in other specified joint: Secondary | ICD-10-CM | POA: Diagnosis not present

## 2022-10-15 DIAGNOSIS — R2681 Unsteadiness on feet: Secondary | ICD-10-CM | POA: Diagnosis not present

## 2022-10-20 DIAGNOSIS — M6281 Muscle weakness (generalized): Secondary | ICD-10-CM | POA: Diagnosis not present

## 2022-10-20 DIAGNOSIS — R2681 Unsteadiness on feet: Secondary | ICD-10-CM | POA: Diagnosis not present

## 2022-10-20 DIAGNOSIS — M2559 Pain in other specified joint: Secondary | ICD-10-CM | POA: Diagnosis not present

## 2022-10-27 DIAGNOSIS — R2681 Unsteadiness on feet: Secondary | ICD-10-CM | POA: Diagnosis not present

## 2022-10-27 DIAGNOSIS — M2559 Pain in other specified joint: Secondary | ICD-10-CM | POA: Diagnosis not present

## 2022-10-27 DIAGNOSIS — M6281 Muscle weakness (generalized): Secondary | ICD-10-CM | POA: Diagnosis not present

## 2022-10-28 ENCOUNTER — Other Ambulatory Visit: Payer: Self-pay | Admitting: Family Medicine

## 2022-10-28 DIAGNOSIS — N3941 Urge incontinence: Secondary | ICD-10-CM

## 2022-10-29 DIAGNOSIS — M6281 Muscle weakness (generalized): Secondary | ICD-10-CM | POA: Diagnosis not present

## 2022-10-29 DIAGNOSIS — M2559 Pain in other specified joint: Secondary | ICD-10-CM | POA: Diagnosis not present

## 2022-10-29 DIAGNOSIS — R2681 Unsteadiness on feet: Secondary | ICD-10-CM | POA: Diagnosis not present

## 2022-10-29 NOTE — Telephone Encounter (Signed)
Called patient and she is doing well on the 25mg  dose and would like a 90 day rx.

## 2022-11-03 DIAGNOSIS — M6281 Muscle weakness (generalized): Secondary | ICD-10-CM | POA: Diagnosis not present

## 2022-11-03 DIAGNOSIS — R2681 Unsteadiness on feet: Secondary | ICD-10-CM | POA: Diagnosis not present

## 2022-11-03 DIAGNOSIS — M2559 Pain in other specified joint: Secondary | ICD-10-CM | POA: Diagnosis not present

## 2022-11-10 DIAGNOSIS — M2559 Pain in other specified joint: Secondary | ICD-10-CM | POA: Diagnosis not present

## 2022-11-10 DIAGNOSIS — M6281 Muscle weakness (generalized): Secondary | ICD-10-CM | POA: Diagnosis not present

## 2022-11-10 DIAGNOSIS — R2681 Unsteadiness on feet: Secondary | ICD-10-CM | POA: Diagnosis not present

## 2022-11-12 DIAGNOSIS — M2559 Pain in other specified joint: Secondary | ICD-10-CM | POA: Diagnosis not present

## 2022-11-12 DIAGNOSIS — M6281 Muscle weakness (generalized): Secondary | ICD-10-CM | POA: Diagnosis not present

## 2022-11-12 DIAGNOSIS — R2681 Unsteadiness on feet: Secondary | ICD-10-CM | POA: Diagnosis not present

## 2022-11-17 DIAGNOSIS — R2681 Unsteadiness on feet: Secondary | ICD-10-CM | POA: Diagnosis not present

## 2022-11-17 DIAGNOSIS — M6281 Muscle weakness (generalized): Secondary | ICD-10-CM | POA: Diagnosis not present

## 2022-11-17 DIAGNOSIS — M2559 Pain in other specified joint: Secondary | ICD-10-CM | POA: Diagnosis not present

## 2022-11-19 DIAGNOSIS — M6281 Muscle weakness (generalized): Secondary | ICD-10-CM | POA: Diagnosis not present

## 2022-11-19 DIAGNOSIS — M2559 Pain in other specified joint: Secondary | ICD-10-CM | POA: Diagnosis not present

## 2022-11-19 DIAGNOSIS — R2681 Unsteadiness on feet: Secondary | ICD-10-CM | POA: Diagnosis not present

## 2022-11-24 DIAGNOSIS — R2681 Unsteadiness on feet: Secondary | ICD-10-CM | POA: Diagnosis not present

## 2022-11-24 DIAGNOSIS — M6281 Muscle weakness (generalized): Secondary | ICD-10-CM | POA: Diagnosis not present

## 2022-11-24 DIAGNOSIS — M2559 Pain in other specified joint: Secondary | ICD-10-CM | POA: Diagnosis not present

## 2022-11-26 DIAGNOSIS — M2559 Pain in other specified joint: Secondary | ICD-10-CM | POA: Diagnosis not present

## 2022-11-26 DIAGNOSIS — R2681 Unsteadiness on feet: Secondary | ICD-10-CM | POA: Diagnosis not present

## 2022-11-26 DIAGNOSIS — M6281 Muscle weakness (generalized): Secondary | ICD-10-CM | POA: Diagnosis not present

## 2022-12-01 DIAGNOSIS — M6281 Muscle weakness (generalized): Secondary | ICD-10-CM | POA: Diagnosis not present

## 2022-12-01 DIAGNOSIS — R2681 Unsteadiness on feet: Secondary | ICD-10-CM | POA: Diagnosis not present

## 2022-12-01 DIAGNOSIS — M2559 Pain in other specified joint: Secondary | ICD-10-CM | POA: Diagnosis not present

## 2022-12-02 ENCOUNTER — Encounter: Payer: Self-pay | Admitting: Podiatry

## 2022-12-02 ENCOUNTER — Ambulatory Visit: Payer: Medicare HMO | Admitting: Podiatry

## 2022-12-02 DIAGNOSIS — M79674 Pain in right toe(s): Secondary | ICD-10-CM

## 2022-12-02 DIAGNOSIS — M79675 Pain in left toe(s): Secondary | ICD-10-CM

## 2022-12-02 DIAGNOSIS — B351 Tinea unguium: Secondary | ICD-10-CM | POA: Diagnosis not present

## 2022-12-02 DIAGNOSIS — I739 Peripheral vascular disease, unspecified: Secondary | ICD-10-CM

## 2022-12-02 NOTE — Progress Notes (Signed)
  Subjective:  Patient ID: Connie [name], female    DOB: 04-May-1928,   MRN: [id number]  No chief complaint on file.   89 y.o. female presents for concern of thickened elongated and painful nails that are difficult to trim. Requesting to have them trimmed today. History of PAD and at risk for foot care.   PCP:  Joselyn Arrow, MD    . Denies any other pedal complaints. Denies n/v/f/c.   Past Medical History:  Diagnosis Date   Abnormal brain MRI 8/01   small hemorrhagic stroke and possible cavernous hemangioma (Dr. Sandria Manly)   Adenomatous colon polyp 1/03   Arthritis    OA spine, hands   Breast cancer (HCC) 2001   R breast (T3N1, ER/PR+, HER-2 +) s/p mastectomy, chemo and chest wall irradiation (Dr. Donnie Coffin)   DDD (degenerative disc disease), lumbar    GERD (gastroesophageal reflux disease)    Hearing loss 2012   wears hearing aids   Herpes zoster 2003   Hyperlipidemia    Hypertension    Onychomycosis 2003, 2008   treated with Lamisil   Osteopenia    (DEXA's done by Dr. Donnie Coffin); prev took Fosamax.  No change in DEXA after off Fosamax x 2 years   Ovarian cyst 12/2008   right   Personal history of chemotherapy    Personal history of radiation therapy    Postmenopausal bleeding 2000   benign EMB (Dr. Stefano Gaul)   Urge urinary incontinence     Objective:  Physical Exam: Vascular: DP/PT pulses 1/4 bilateral. CFT <3 seconds. Absent hair growth on digits. Edema noted to bilateral lower extremities. Xerosis noted bilaterally.  Skin. No lacerations or abrasions bilateral feet. Nails 1-5 bilateral  are thickened discolored and elongated with subungual debris.  Musculoskeletal: MMT 5/5 bilateral lower extremities in DF, PF, Inversion and Eversion. Deceased ROM in DF of ankle joint.  Neurological: Sensation intact to light touch. Protective sensation intact bilateral.    Assessment:   1. Pain due to onychomycosis of toenails of both feet   2. PAD (peripheral artery disease) (HCC)       Plan:  Patient was evaluated and treated and all questions answered. -Mechanically debrided all nails 1-5 bilateral using sterile nail nipper and filed with dremel without incident  -Answered all patient questions -Patient to return  in 3 months for at risk foot care -Patient advised to call the office if any problems or questions arise in the meantime.   Louann Sjogren, DPM

## 2022-12-03 DIAGNOSIS — R2681 Unsteadiness on feet: Secondary | ICD-10-CM | POA: Diagnosis not present

## 2022-12-03 DIAGNOSIS — M6281 Muscle weakness (generalized): Secondary | ICD-10-CM | POA: Diagnosis not present

## 2022-12-03 DIAGNOSIS — M2559 Pain in other specified joint: Secondary | ICD-10-CM | POA: Diagnosis not present

## 2022-12-08 DIAGNOSIS — M6281 Muscle weakness (generalized): Secondary | ICD-10-CM | POA: Diagnosis not present

## 2022-12-08 DIAGNOSIS — R2681 Unsteadiness on feet: Secondary | ICD-10-CM | POA: Diagnosis not present

## 2022-12-08 DIAGNOSIS — M2559 Pain in other specified joint: Secondary | ICD-10-CM | POA: Diagnosis not present

## 2022-12-10 DIAGNOSIS — R2681 Unsteadiness on feet: Secondary | ICD-10-CM | POA: Diagnosis not present

## 2022-12-10 DIAGNOSIS — M6281 Muscle weakness (generalized): Secondary | ICD-10-CM | POA: Diagnosis not present

## 2022-12-10 DIAGNOSIS — M2559 Pain in other specified joint: Secondary | ICD-10-CM | POA: Diagnosis not present

## 2022-12-22 DIAGNOSIS — R2681 Unsteadiness on feet: Secondary | ICD-10-CM | POA: Diagnosis not present

## 2022-12-22 DIAGNOSIS — M6281 Muscle weakness (generalized): Secondary | ICD-10-CM | POA: Diagnosis not present

## 2022-12-22 DIAGNOSIS — M2559 Pain in other specified joint: Secondary | ICD-10-CM | POA: Diagnosis not present

## 2022-12-24 DIAGNOSIS — R2681 Unsteadiness on feet: Secondary | ICD-10-CM | POA: Diagnosis not present

## 2022-12-24 DIAGNOSIS — M2559 Pain in other specified joint: Secondary | ICD-10-CM | POA: Diagnosis not present

## 2022-12-24 DIAGNOSIS — M6281 Muscle weakness (generalized): Secondary | ICD-10-CM | POA: Diagnosis not present

## 2022-12-31 DIAGNOSIS — M6281 Muscle weakness (generalized): Secondary | ICD-10-CM | POA: Diagnosis not present

## 2022-12-31 DIAGNOSIS — M2559 Pain in other specified joint: Secondary | ICD-10-CM | POA: Diagnosis not present

## 2022-12-31 DIAGNOSIS — R2681 Unsteadiness on feet: Secondary | ICD-10-CM | POA: Diagnosis not present

## 2023-02-02 ENCOUNTER — Other Ambulatory Visit: Payer: Self-pay | Admitting: Family Medicine

## 2023-02-02 DIAGNOSIS — N3941 Urge incontinence: Secondary | ICD-10-CM

## 2023-02-09 ENCOUNTER — Ambulatory Visit: Payer: Medicare HMO | Admitting: Cardiology

## 2023-02-17 ENCOUNTER — Ambulatory Visit: Payer: Medicare HMO | Admitting: Podiatry

## 2023-02-17 ENCOUNTER — Encounter: Payer: Self-pay | Admitting: Podiatry

## 2023-02-17 ENCOUNTER — Other Ambulatory Visit: Payer: Self-pay | Admitting: Cardiology

## 2023-02-17 DIAGNOSIS — L84 Corns and callosities: Secondary | ICD-10-CM

## 2023-02-17 DIAGNOSIS — I480 Paroxysmal atrial fibrillation: Secondary | ICD-10-CM

## 2023-02-17 DIAGNOSIS — M79675 Pain in left toe(s): Secondary | ICD-10-CM

## 2023-02-17 DIAGNOSIS — M79674 Pain in right toe(s): Secondary | ICD-10-CM | POA: Diagnosis not present

## 2023-02-17 DIAGNOSIS — B351 Tinea unguium: Secondary | ICD-10-CM

## 2023-02-17 DIAGNOSIS — I739 Peripheral vascular disease, unspecified: Secondary | ICD-10-CM | POA: Diagnosis not present

## 2023-02-18 NOTE — Telephone Encounter (Signed)
Prescription refill request for Xarelto received.  Indication: afib  Last office visit: Connie [name] 01/13/2022 Weight:69.3 kg  Age: 89 yo  Scr: 1.35, 09/02/2022 CrCl: 28 ml/min   Pt overdue for an office visit. Msg sent to schedulers.

## 2023-02-19 NOTE — Telephone Encounter (Signed)
Pt has appointment scheduled to see Dr. Anne Fu on 07/22/2023.

## 2023-02-22 NOTE — Progress Notes (Signed)
Subjective:  Patient ID: Connie [name], female    DOB: 06/22/1927,  MRN: [id number]  89 y.o. female presents to clinic with  at risk foot care. Patient has h/o PAD and callus(es) of both feet and painful thick toenails that are difficult to trim. Painful toenails interfere with ambulation. Aggravating factors include wearing enclosed shoe gear. Pain is relieved with periodic professional debridement. Painful calluses are aggravated when weightbearing with and without shoegear. Pain is relieved with periodic professional debridement.   Chief Complaint  Patient presents with   RFC    RFC- Pt states not having any problems at this time.   Sadly, patient informs me her husband passed away nine days ago.    New problem(s): None   PCP is Joselyn Arrow, MD.  Allergies  Allergen Reactions   Cephalexin Itching   Ciprofloxacin Itching   Diclofenac Nausea Only    Upset stomach   Shellfish-Derived Products Nausea And Vomiting   Tape Rash   Review of Systems: Negative except as noted in the HPI.   Objective:  Connie [name] is a pleasant 89 y.o. female WD, WN in NAD.Marland Kitchen  Vascular Examination: Faintly palpable DP pulses b/l; PT pulses diminished b/l. CFT < 3 seconds b/l. No edema. No pain with calf compression b/l. Skin temperature gradient WNL b/l. Trace edema noted BLE.  Neurological Examination: Sensation grossly intact b/l with 10 gram monofilament. Vibratory sensation intact b/l.   Dermatological Examination: Pedal skin with normal turgor, texture and tone b/l. Toenails 1-5 b/l thick, discolored, elongated with subungual debris and pain on dorsal palpation. No hyperkeratotic lesions noted b/l. Hyperkeratotic lesion(s) submet head 1 b/l and submet head 2 left foot.  No erythema, no edema, no drainage, no fluctuance.  Musculoskeletal Examination: Muscle strength 5/5 to b/l LE. HAV with bunion deformity noted b/l LE. Hammertoe(s) noted to the 2nd digits b/l.  Radiographs:  None  Last A1c:      Latest Ref Rng & Units 09/02/2022    1:59 PM  Hemoglobin A1C  Hemoglobin-A1c 4.0 - 5.6 % 5.5    Assessment:   1. Pain due to onychomycosis of toenails of both feet   2. Callus   3. PAD (peripheral artery disease) (HCC)    Plan:  -Patient was evaluated and treated. All patient's and/or POA's questions/concerns answered on today's visit. -Patient to continue soft, supportive shoe gear daily. -Toenails 1-5 b/l were debrided in length and girth with sterile nail nippers and dremel without iatrogenic bleeding.  -Callus(es) submet head 1 b/l and submet head 2 left foot pared utilizing sterile scalpel blade without complication or incident. Total number debrided =3. -Patient/POA to call should there be question/concern in the interim.  Return in about 3 months (around 05/20/2023).  Freddie Breech, DPM

## 2023-03-01 ENCOUNTER — Other Ambulatory Visit: Payer: Self-pay | Admitting: Family Medicine

## 2023-03-01 DIAGNOSIS — E782 Mixed hyperlipidemia: Secondary | ICD-10-CM

## 2023-03-10 ENCOUNTER — Other Ambulatory Visit: Payer: Medicare HMO

## 2023-03-10 DIAGNOSIS — E782 Mixed hyperlipidemia: Secondary | ICD-10-CM | POA: Diagnosis not present

## 2023-03-10 DIAGNOSIS — I10 Essential (primary) hypertension: Secondary | ICD-10-CM

## 2023-03-10 DIAGNOSIS — R7303 Prediabetes: Secondary | ICD-10-CM | POA: Diagnosis not present

## 2023-03-10 DIAGNOSIS — Z7901 Long term (current) use of anticoagulants: Secondary | ICD-10-CM

## 2023-03-10 DIAGNOSIS — N1832 Chronic kidney disease, stage 3b: Secondary | ICD-10-CM

## 2023-03-10 DIAGNOSIS — M109 Gout, unspecified: Secondary | ICD-10-CM

## 2023-03-10 DIAGNOSIS — Z5181 Encounter for therapeutic drug level monitoring: Secondary | ICD-10-CM | POA: Diagnosis not present

## 2023-03-11 LAB — CBC WITH DIFFERENTIAL/PLATELET
Basophils Absolute: 0.1 10*3/uL (ref 0.0–0.2)
Basos: 1 %
EOS (ABSOLUTE): 0.2 10*3/uL (ref 0.0–0.4)
Eos: 3 %
Hematocrit: 38.9 % (ref 34.0–46.6)
Hemoglobin: 12.4 g/dL (ref 11.1–15.9)
Immature Grans (Abs): 0 10*3/uL (ref 0.0–0.1)
Immature Granulocytes: 0 %
Lymphocytes Absolute: 1.6 10*3/uL (ref 0.7–3.1)
Lymphs: 26 %
MCH: 30.7 pg (ref 26.6–33.0)
MCHC: 31.9 g/dL (ref 31.5–35.7)
MCV: 96 fL (ref 79–97)
Monocytes Absolute: 0.5 10*3/uL (ref 0.1–0.9)
Monocytes: 7 %
Neutrophils Absolute: 3.8 10*3/uL (ref 1.4–7.0)
Neutrophils: 63 %
Platelets: 186 10*3/uL (ref 150–450)
RBC: 4.04 x10E6/uL (ref 3.77–5.28)
RDW: 12.8 % (ref 11.7–15.4)
WBC: 6.1 10*3/uL (ref 3.4–10.8)

## 2023-03-11 LAB — URIC ACID: Uric Acid: 4.5 mg/dL (ref 3.1–7.9)

## 2023-03-11 LAB — COMPREHENSIVE METABOLIC PANEL
ALT: 13 IU/L (ref 0–32)
AST: 17 IU/L (ref 0–40)
Albumin: 4 g/dL (ref 3.6–4.6)
Alkaline Phosphatase: 106 IU/L (ref 44–121)
BUN/Creatinine Ratio: 26 (ref 12–28)
BUN: 25 mg/dL (ref 10–36)
Bilirubin Total: 0.3 mg/dL (ref 0.0–1.2)
CO2: 26 mmol/L (ref 20–29)
Calcium: 9.7 mg/dL (ref 8.7–10.3)
Chloride: 106 mmol/L (ref 96–106)
Creatinine, Ser: 0.96 mg/dL (ref 0.57–1.00)
Globulin, Total: 2.3 g/dL (ref 1.5–4.5)
Glucose: 90 mg/dL (ref 70–99)
Potassium: 4.3 mmol/L (ref 3.5–5.2)
Sodium: 145 mmol/L — ABNORMAL HIGH (ref 134–144)
Total Protein: 6.3 g/dL (ref 6.0–8.5)
eGFR: 55 mL/min/{1.73_m2} — ABNORMAL LOW (ref >59)

## 2023-03-11 LAB — LIPID PANEL
Chol/HDL Ratio: 2.7 ratio (ref 0.0–4.4)
Cholesterol, Total: 145 mg/dL (ref 100–199)
HDL: 54 mg/dL (ref >39)
LDL Chol Calc (NIH): 69 mg/dL (ref 0–99)
Triglycerides: 122 mg/dL (ref 0–149)
VLDL Cholesterol Cal: 22 mg/dL (ref 5–40)

## 2023-03-11 LAB — TSH: TSH: 3.67 u[IU]/mL (ref 0.450–4.500)

## 2023-03-11 LAB — HEMOGLOBIN A1C
Est. average glucose Bld gHb Est-mCnc: 123 mg/dL
Hgb A1c MFr Bld: 5.9 % — ABNORMAL HIGH (ref 4.8–5.6)

## 2023-03-16 NOTE — Progress Notes (Addendum)
Chief Complaint  Patient presents with   Hypertension    6 month follow up. She has been coughing x 4 months and it will not go away, clears up and then goes back to yellow-morning and night worse. Starting to feel it in her chest.    Patient presents for 6 month follow-up on chronic problems. She had labs done prior to her visit.  She notes coughing off and on, both in the morning and at night. She is getting up some thick phlegm. She feels like it is sometimes more from her chest than just from the throat. Half the time it is clear, half the time it is yellow. She hasn't tried any mucinex or other cough medications, other than a cough drop. She feels like her sinuses and nose is clear.    She mentioned this at her physical in May as well--cough productive of thick mucus in the morning (somewhat yellow), clear or paler yellow during the day. She reported throat-clearing and some congestion, but no runny nose.  Her husband passed away on 02-19-23. Got info for grief counseling, isn't ready yet. Doing okay overall. Was eating in the dining room the next day. Has supportive friends and family.  At last visit she was having some LBP/stiffness, as lifting his oxygen tank bothered her back.  She completed a course of PT through [facility]. She no longer does any lifting, and is sleeping differently (not all curled up), does a good stretch in the morning in her bed, if needed, and back is doing better. She hasn't been doing home exercises. She has known advanced DDD at L4-L5 and L5-S1 (XR 05/2020). She denies numbness, tingling, weakness in LE's.   Hyperlipidemia and aortic atherosclerosis (noted on CXR 06/2021):  Compliant with 40mg  of pravastatin without side effects.  She didn't tolerate fish oil in the past (due to belching), and not taking Lovaza (d/t insurance issue) or Vascepa (did not tolerate due to acid reflux, even just 1 capsule daily).  She reports eating occasional fried fish. No longer  eating desserts (used to split one with her husband a couple of times/week), but did have sweets that people brought over after he passed away.. TG were just mildly elevated last year (152), normal on recent labs (see below).  Urge urinary incontinence--at her physical she reported that Detrol LA wasn't adequately controlling her symptoms. We switched her to Myrbetriq at that time. She has been doing well on 25mg . She denies side effects. She reports improvement, getting up usually 1-2x/night (occ still gets up more often, but less frequent). She has less incontinence, she is able to get to the bathroom better during the day. She denies side effects. Denies any dysuria, odor or hematuria. She wears Poise pads daily, due to the urgency and incontinence, but now often it is dry.    Pre-diabetes--She admits she enjoys good breads and ice cream. She tries to limit her portions. Not having as many sandwiches.  She has been eating more sweets (that people brought over).  She hasn't been ordering dessert. A1c was normal in 09/2022 (5.5), elevated on recent check (5.9).      Atrial fibrillation:  She is under the care of cardiology, and rhythm is controlled with amiodarone. She had to cancel her visit last month (was just after her husband died).  She continues on diltiazem 240mg  and Xarelto. She denies any significant bleeding.  Hypertension: controlled with diltiazem 240mg . She previously took HCTZ , but was stopped in  01/2022 due to Cr going up to 1.75 (suspected pre-renal component). Cr improved on recheck. She has remained off HCTZ.  She hasn't been checking her BP at home.  She has some feet swelling just in the evenings, gone by morning.   BP Readings from Last 3 Encounters:  03/17/23 (!) 128/58  09/02/22 120/60  05/19/22 (!) 164/96   Gout:  She continues to do well with 200mg  of allopurinol, without any gout flares (great toe).  She uses Tylenol Arthritis 2 BID, which controls her pain (feet,  hands, back). She is no longer on hydrochlorothiazide for HTN control, and uric acid level remains low, see below.   History of breast cancer:  Last mammogram was in 2020, declines further.  Denies breast concerns. At her last visit she reported no longer wearing compression sleeve (for about 2 months, at that time)--she wasn't able to get it on/off by herself, husband couldn't help her. She still hasn't been wearing it, and hasn't noticed any significant swelling.   PMH, PSH, SH reviewed  Outpatient Encounter Medications as of 03/17/2023  Medication Sig Note   acetaminophen (TYLENOL) 650 MG CR tablet Take 1,300 mg by mouth 2 (two) times daily. 03/17/2023: Took 2 this am. Leanora Ivanoff 2BID     allopurinol (ZYLOPRIM) 100 MG tablet Take 2 tablets (200 mg total) by mouth daily. 03/17/2023: Taking 2 daily   amiodarone (PACERONE) 200 MG tablet Take 1 tablet (200 mg total) by mouth daily.    Calcium Carbonate-Vitamin D 600-400 MG-UNIT tablet Take 1 tablet by mouth daily.    diltiazem (CARDIZEM CD) 240 MG 24 hr capsule Take 1 capsule (240 mg total) by mouth daily.    Multiple Vitamins-Minerals (CENTRUM SILVER PO) Take 1 tablet by mouth daily.    MYRBETRIQ 25 MG TB24 tablet TAKE 1 TABLET BY MOUTH DAILY    polyethylene glycol (MIRALAX / GLYCOLAX) 17 g packet Take 17 g by mouth daily. 03/17/2023: Every am   pravastatin (PRAVACHOL) 40 MG tablet TAKE 1 TABLET BY MOUTH DAILY    simethicone (MYLICON) 125 MG chewable tablet Chew 125 mg by mouth every 6 (six) hours as needed for flatulence. 03/17/2023: Several times a week   XARELTO 15 MG TABS tablet TAKE 1 TABLET BY MOUTH DAILY WITH SUPPER    calcium carbonate (TUMS - DOSED IN MG ELEMENTAL CALCIUM) 500 MG chewable tablet Chew 1 tablet by mouth daily as needed for indigestion or heartburn. Reported on 07/10/2015 (Patient not taking: Reported on 03/17/2023) 03/17/2023: rarely   colchicine 0.6 MG tablet Use as directed. Take two tablets by mouth at start of gout  flare up. Then one hour later take one tablet by mouth. (Patient not taking: Reported on 03/17/2023) 03/17/2023: As needed   omeprazole (PRILOSEC) 20 MG capsule Take 1 capsule (20 mg total) by mouth daily. Reported on 05/23/2015 (Patient not taking: Reported on 03/17/2023) 03/17/2023: occasionally   No facility-administered encounter medications on file as of 03/17/2023.   Allergies  Allergen Reactions   Cephalexin Itching   Ciprofloxacin Itching   Diclofenac Nausea Only    Upset stomach   Shellfish-Derived Products Nausea And Vomiting   Tape Rash   ROS: no fever, chills, URI symptoms, chest pain, shortness of breath. No dysuria, hematuria.  No nausea, vomiting. No longer having much heartburn. She has some  bloating/gas, occasional lower abdominal pain, Gas-X help with this prn. Bowels are otherwise normal. No bleeding, bruising, rash. Joint pains are controlled with tylenol Some ankle swelling at night, resolved by  morning. Moods are okay, feels like she is doing well with her grief. Denies arm pain or swelling. Cough per HPI Some shortness of breath if walking quickly Back pain is improved.   PHYSICAL EXAM:  BP (!) 128/58   Pulse 64   Temp 97.7 F (36.5 C) (Tympanic)   Ht 5' (1.524 m)   Wt 149 lb 6.4 oz (67.8 kg)   BMI 29.18 kg/m   Wt Readings from Last 3 Encounters:  03/17/23 149 lb 6.4 oz (67.8 kg)  09/02/22 152 lb 12.8 oz (69.3 kg)  05/19/22 156 lb (70.8 kg)   Well developed, pleasant female in no distress HEENT: PERRL, EOMI, conjunctiva clear.  Nasal mucosa is moderately edematous, no purulence. Sinuses are nontender. Neck: no lymphadenopathy, thyromegaly or carotid bruit Heart: regular rate and rhythm. No heart murmur Lungs: clear bilaterally Back: no CVA or spinal tenderness Abdomen: soft, nontender, no organomegaly or mass Extremities: No edema. 2+ pulses.  Skin: no rashes.  Normal turgor. No bruising or purpura Neuro: alert and oriented. Normal  gait. Psych: normal mood, affect, hygiene and grooming   Lab Results  Component Value Date   CHOL 145 03/10/2023   HDL 54 03/10/2023   LDLCALC 69 03/10/2023   TRIG 122 03/10/2023   CHOLHDL 2.7 03/10/2023   Lab Results  Component Value Date   TSH 3.670 03/10/2023   Lab Results  Component Value Date   LABURIC 4.5 03/10/2023   Lab Results  Component Value Date   WBC 6.1 03/10/2023   HGB 12.4 03/10/2023   HCT 38.9 03/10/2023   MCV 96 03/10/2023   PLT 186 03/10/2023     Chemistry      Component Value Date/Time   NA 145 (H) 03/10/2023 0919   NA 141 11/11/2012 1020   K 4.3 03/10/2023 0919   K 4.1 11/11/2012 1020   CL 106 03/10/2023 0919   CO2 26 03/10/2023 0919   CO2 26 11/11/2012 1020   BUN 25 03/10/2023 0919   BUN 31.6 (H) 11/11/2012 1020   CREATININE 0.96 03/10/2023 0919   CREATININE 1.16 (H) 12/08/2016 0755   CREATININE 1.5 (H) 11/11/2012 1020      Component Value Date/Time   CALCIUM 9.7 03/10/2023 0919   CALCIUM 10.1 11/11/2012 1020   ALKPHOS 106 03/10/2023 0919   ALKPHOS 78 11/11/2012 1020   AST 17 03/10/2023 0919   AST 19 11/11/2012 1020   ALT 13 03/10/2023 0919   ALT 17 11/11/2012 1020   BILITOT 0.3 03/10/2023 0919   BILITOT 0.43 11/11/2012 1020     GFR 55 Glucose 90  Lab Results  Component Value Date   HGBA1C 5.9 (H) 03/10/2023      ASSESSMENT/PLAN:  Essential hypertension, benign - controlled, continue current regimen  Chronic cough - based on exam, is due to postnasal drainage, most likely from allergies. Encouraged antihistamine and mucinex  Paroxysmal atrial fibrillation with rapid ventricular response (HCC) - in sinus rhythm. Cont CCB and anticoagulation.  Cont care with cardiology  Prediabetes - continue to limit sweets. Daily exercise encouraged  Mixed hyperlipidemia - continue pravastatin. Continue lowfat, low cholesterol diet  Gout of big toe - trial of decreasing allopurinol to 100mg , and recheck level in 3 mos. May tolerate  lower dose since no longer on HCTZ - Plan: Uric acid, DISCONTINUED: allopurinol (ZYLOPRIM) 100 MG tablet  Stage 3b chronic kidney disease (HCC)  Aortic atherosclerosis (HCC) - continue statin  Acquired thrombophilia (HCC) - d/t atrial fibrillation. Continue blood thinner  Current use of long term anticoagulation  Need for influenza vaccination - Plan: Flu Vaccine Trivalent High Dose (Fluad)  Medication monitoring encounter - Plan: Uric acid  F/u as scheduled for CPE/AWV in 09/2023

## 2023-03-17 ENCOUNTER — Ambulatory Visit (INDEPENDENT_AMBULATORY_CARE_PROVIDER_SITE_OTHER): Payer: Medicare HMO | Admitting: Family Medicine

## 2023-03-17 ENCOUNTER — Encounter: Payer: Self-pay | Admitting: Family Medicine

## 2023-03-17 VITALS — BP 128/58 | HR 64 | Temp 97.7°F | Ht 60.0 in | Wt 149.4 lb

## 2023-03-17 DIAGNOSIS — I700 Atherosclerosis of aorta: Secondary | ICD-10-CM | POA: Diagnosis not present

## 2023-03-17 DIAGNOSIS — E782 Mixed hyperlipidemia: Secondary | ICD-10-CM | POA: Diagnosis not present

## 2023-03-17 DIAGNOSIS — M109 Gout, unspecified: Secondary | ICD-10-CM | POA: Diagnosis not present

## 2023-03-17 DIAGNOSIS — I10 Essential (primary) hypertension: Secondary | ICD-10-CM | POA: Diagnosis not present

## 2023-03-17 DIAGNOSIS — I480 Paroxysmal atrial fibrillation: Secondary | ICD-10-CM | POA: Diagnosis not present

## 2023-03-17 DIAGNOSIS — Z7901 Long term (current) use of anticoagulants: Secondary | ICD-10-CM

## 2023-03-17 DIAGNOSIS — R7303 Prediabetes: Secondary | ICD-10-CM | POA: Diagnosis not present

## 2023-03-17 DIAGNOSIS — N1832 Chronic kidney disease, stage 3b: Secondary | ICD-10-CM

## 2023-03-17 DIAGNOSIS — Z23 Encounter for immunization: Secondary | ICD-10-CM

## 2023-03-17 DIAGNOSIS — Z5181 Encounter for therapeutic drug level monitoring: Secondary | ICD-10-CM

## 2023-03-17 DIAGNOSIS — R053 Chronic cough: Secondary | ICD-10-CM | POA: Diagnosis not present

## 2023-03-17 DIAGNOSIS — D6869 Other thrombophilia: Secondary | ICD-10-CM | POA: Diagnosis not present

## 2023-03-17 MED ORDER — ALLOPURINOL 100 MG PO TABS: 100.0000 mg | ORAL_TABLET | Freq: Every day | ORAL | Status: DC

## 2023-03-17 NOTE — Patient Instructions (Addendum)
We are lowering your allopurinol dose to just ONE 100 mg tablets daily (instead of 2). You likely don't need as much since you no longer take hydrochlorothiazide. Return in 2-3 months for a nonfasting lab visit to recheck the uric acid level. If it goes up above 6, we will increase the dose back.  You had mentioned recently getting this filled (for 90 days at 2/day). If you were incorrect, and you are running low, let us know when and we can send a refill (for lower quantity since the dose was lowered).  Stay well hydrated. Consider taking a claritin, allegra OR zyrtec as allergies might contribute to your cough (from postnasal drainage). I also recommend taking plain Mucinex (guaifenesin) to help keep the phlegm thin. I recommend getting the 12 hour version, if you can swallow large pills.  If you notice more shortness of breath with activity, I encourage you to contact the cardiologist office to get a sooner appointment (which may be with a nurse practitioner).

## 2023-03-18 ENCOUNTER — Encounter: Payer: Self-pay | Admitting: Family Medicine

## 2023-04-12 ENCOUNTER — Telehealth: Payer: Self-pay | Admitting: Family Medicine

## 2023-04-12 DIAGNOSIS — L821 Other seborrheic keratosis: Secondary | ICD-10-CM | POA: Diagnosis not present

## 2023-04-12 DIAGNOSIS — D485 Neoplasm of uncertain behavior of skin: Secondary | ICD-10-CM | POA: Diagnosis not present

## 2023-04-12 DIAGNOSIS — Z85828 Personal history of other malignant neoplasm of skin: Secondary | ICD-10-CM | POA: Diagnosis not present

## 2023-04-12 DIAGNOSIS — D2262 Melanocytic nevi of left upper limb, including shoulder: Secondary | ICD-10-CM | POA: Diagnosis not present

## 2023-04-12 DIAGNOSIS — D1801 Hemangioma of skin and subcutaneous tissue: Secondary | ICD-10-CM | POA: Diagnosis not present

## 2023-04-12 DIAGNOSIS — D2261 Melanocytic nevi of right upper limb, including shoulder: Secondary | ICD-10-CM | POA: Diagnosis not present

## 2023-04-12 DIAGNOSIS — L570 Actinic keratosis: Secondary | ICD-10-CM | POA: Diagnosis not present

## 2023-04-12 DIAGNOSIS — M109 Gout, unspecified: Secondary | ICD-10-CM

## 2023-04-12 DIAGNOSIS — D2271 Melanocytic nevi of right lower limb, including hip: Secondary | ICD-10-CM | POA: Diagnosis not present

## 2023-04-12 MED ORDER — ALLOPURINOL 100 MG PO TABS: 100.0000 mg | ORAL_TABLET | Freq: Every day | ORAL | 1 refills | Status: DC

## 2023-04-12 NOTE — Telephone Encounter (Signed)
Pt requesting a refill on allopurinol to [facility] [id number], [place] - [address]

## 2023-04-12 NOTE — Telephone Encounter (Signed)
Done

## 2023-04-21 ENCOUNTER — Ambulatory Visit: Payer: Medicare HMO | Admitting: Podiatry

## 2023-04-26 ENCOUNTER — Ambulatory Visit: Payer: Medicare HMO | Admitting: Podiatry

## 2023-04-26 DIAGNOSIS — I739 Peripheral vascular disease, unspecified: Secondary | ICD-10-CM

## 2023-04-26 DIAGNOSIS — M79675 Pain in left toe(s): Secondary | ICD-10-CM

## 2023-04-26 DIAGNOSIS — M79674 Pain in right toe(s): Secondary | ICD-10-CM | POA: Diagnosis not present

## 2023-04-26 DIAGNOSIS — B351 Tinea unguium: Secondary | ICD-10-CM

## 2023-04-26 NOTE — Progress Notes (Signed)
  Subjective:  Patient ID: Connie [name], female    DOB: 04-May-1928,   MRN: [id number]  No chief complaint on file.   89 y.o. female presents for concern of thickened elongated and painful nails that are difficult to trim. Requesting to have them trimmed today. History of PAD and at risk for foot care.   PCP:  Joselyn Arrow, MD    . Denies any other pedal complaints. Denies n/v/f/c.   Past Medical History:  Diagnosis Date   Abnormal brain MRI 8/01   small hemorrhagic stroke and possible cavernous hemangioma (Dr. Sandria Manly)   Adenomatous colon polyp 1/03   Arthritis    OA spine, hands   Breast cancer (HCC) 2001   R breast (T3N1, ER/PR+, HER-2 +) s/p mastectomy, chemo and chest wall irradiation (Dr. Donnie Coffin)   DDD (degenerative disc disease), lumbar    GERD (gastroesophageal reflux disease)    Hearing loss 2012   wears hearing aids   Herpes zoster 2003   Hyperlipidemia    Hypertension    Onychomycosis 2003, 2008   treated with Lamisil   Osteopenia    (DEXA's done by Dr. Donnie Coffin); prev took Fosamax.  No change in DEXA after off Fosamax x 2 years   Ovarian cyst 12/2008   right   Personal history of chemotherapy    Personal history of radiation therapy    Postmenopausal bleeding 2000   benign EMB (Dr. Stefano Gaul)   Urge urinary incontinence     Objective:  Physical Exam: Vascular: DP/PT pulses 1/4 bilateral. CFT <3 seconds. Absent hair growth on digits. Edema noted to bilateral lower extremities. Xerosis noted bilaterally.  Skin. No lacerations or abrasions bilateral feet. Nails 1-5 bilateral  are thickened discolored and elongated with subungual debris.  Musculoskeletal: MMT 5/5 bilateral lower extremities in DF, PF, Inversion and Eversion. Deceased ROM in DF of ankle joint.  Neurological: Sensation intact to light touch. Protective sensation intact bilateral.    Assessment:   1. Pain due to onychomycosis of toenails of both feet   2. PAD (peripheral artery disease) (HCC)       Plan:  Patient was evaluated and treated and all questions answered. -Mechanically debrided all nails 1-5 bilateral using sterile nail nipper and filed with dremel without incident  -Answered all patient questions -Patient to return  in 3 months for at risk foot care -Patient advised to call the office if any problems or questions arise in the meantime.   Louann Sjogren, DPM

## 2023-05-03 ENCOUNTER — Other Ambulatory Visit: Payer: Self-pay | Admitting: Family Medicine

## 2023-05-03 ENCOUNTER — Other Ambulatory Visit: Payer: Self-pay

## 2023-05-03 DIAGNOSIS — N3941 Urge incontinence: Secondary | ICD-10-CM

## 2023-05-03 MED ORDER — DILTIAZEM HCL ER COATED BEADS 240 MG PO CP24: 240.0000 mg | ORAL_CAPSULE | Freq: Every day | ORAL | 0 refills | Status: DC

## 2023-05-13 ENCOUNTER — Ambulatory Visit: Payer: Medicare HMO | Admitting: Sports Medicine

## 2023-05-30 ENCOUNTER — Other Ambulatory Visit: Payer: Self-pay | Admitting: Family Medicine

## 2023-05-30 DIAGNOSIS — E782 Mixed hyperlipidemia: Secondary | ICD-10-CM

## 2023-06-02 ENCOUNTER — Other Ambulatory Visit: Payer: Medicare HMO

## 2023-06-02 DIAGNOSIS — Z5181 Encounter for therapeutic drug level monitoring: Secondary | ICD-10-CM

## 2023-06-02 DIAGNOSIS — M109 Gout, unspecified: Secondary | ICD-10-CM | POA: Diagnosis not present

## 2023-06-03 ENCOUNTER — Encounter: Payer: Self-pay | Admitting: Family Medicine

## 2023-06-03 LAB — URIC ACID: Uric Acid: 5.5 mg/dL (ref 3.1–7.9)

## 2023-07-07 ENCOUNTER — Ambulatory Visit: Payer: Medicare HMO | Admitting: Podiatry

## 2023-07-07 ENCOUNTER — Encounter: Payer: Self-pay | Admitting: Podiatry

## 2023-07-07 DIAGNOSIS — M79674 Pain in right toe(s): Secondary | ICD-10-CM | POA: Diagnosis not present

## 2023-07-07 DIAGNOSIS — M79675 Pain in left toe(s): Secondary | ICD-10-CM

## 2023-07-07 DIAGNOSIS — I739 Peripheral vascular disease, unspecified: Secondary | ICD-10-CM

## 2023-07-07 DIAGNOSIS — B351 Tinea unguium: Secondary | ICD-10-CM

## 2023-07-07 NOTE — Progress Notes (Signed)
  Subjective:  Patient ID: Connie [name], female    DOB: 04-May-1928,   MRN: [id number]  No chief complaint on file.   89 y.o. female presents for concern of thickened elongated and painful nails that are difficult to trim. Requesting to have them trimmed today. History of PAD and at risk for foot care.   PCP:  Joselyn Arrow, MD    . Denies any other pedal complaints. Denies n/v/f/c.   Past Medical History:  Diagnosis Date   Abnormal brain MRI 8/01   small hemorrhagic stroke and possible cavernous hemangioma (Dr. Sandria Manly)   Adenomatous colon polyp 1/03   Arthritis    OA spine, hands   Breast cancer (HCC) 2001   R breast (T3N1, ER/PR+, HER-2 +) s/p mastectomy, chemo and chest wall irradiation (Dr. Donnie Coffin)   DDD (degenerative disc disease), lumbar    GERD (gastroesophageal reflux disease)    Hearing loss 2012   wears hearing aids   Herpes zoster 2003   Hyperlipidemia    Hypertension    Onychomycosis 2003, 2008   treated with Lamisil   Osteopenia    (DEXA's done by Dr. Donnie Coffin); prev took Fosamax.  No change in DEXA after off Fosamax x 2 years   Ovarian cyst 12/2008   right   Personal history of chemotherapy    Personal history of radiation therapy    Postmenopausal bleeding 2000   benign EMB (Dr. Stefano Gaul)   Urge urinary incontinence     Objective:  Physical Exam: Vascular: DP/PT pulses 1/4 bilateral. CFT <3 seconds. Absent hair growth on digits. Edema noted to bilateral lower extremities. Xerosis noted bilaterally.  Skin. No lacerations or abrasions bilateral feet. Nails 1-5 bilateral  are thickened discolored and elongated with subungual debris.  Musculoskeletal: MMT 5/5 bilateral lower extremities in DF, PF, Inversion and Eversion. Deceased ROM in DF of ankle joint.  Neurological: Sensation intact to light touch. Protective sensation intact bilateral.    Assessment:   1. Pain due to onychomycosis of toenails of both feet   2. PAD (peripheral artery disease) (HCC)       Plan:  Patient was evaluated and treated and all questions answered. -Mechanically debrided all nails 1-5 bilateral using sterile nail nipper and filed with dremel without incident  -Answered all patient questions -Patient to return  in 3 months for at risk foot care -Patient advised to call the office if any problems or questions arise in the meantime.   Louann Sjogren, DPM

## 2023-07-22 ENCOUNTER — Ambulatory Visit: Payer: Medicare HMO | Attending: Cardiology | Admitting: Cardiology

## 2023-07-22 ENCOUNTER — Encounter: Payer: Self-pay | Admitting: Cardiology

## 2023-07-22 VITALS — BP 134/58 | HR 73 | Ht 60.5 in | Wt 147.4 lb

## 2023-07-22 DIAGNOSIS — Z7901 Long term (current) use of anticoagulants: Secondary | ICD-10-CM | POA: Diagnosis not present

## 2023-07-22 DIAGNOSIS — E7800 Pure hypercholesterolemia, unspecified: Secondary | ICD-10-CM

## 2023-07-22 DIAGNOSIS — I10 Essential (primary) hypertension: Secondary | ICD-10-CM

## 2023-07-22 DIAGNOSIS — Z79899 Other long term (current) drug therapy: Secondary | ICD-10-CM

## 2023-07-22 DIAGNOSIS — I4891 Unspecified atrial fibrillation: Secondary | ICD-10-CM | POA: Diagnosis not present

## 2023-07-22 NOTE — Patient Instructions (Signed)
 Medication Instructions:  The current medical regimen is effective;  continue present plan and medications.  *If you need a refill on your cardiac medications before your next appointment, please call your pharmacy*  Follow-Up: At [facility], you and your health needs are our priority.  As part of our continuing mission to provide you with exceptional heart care, we have created designated Provider Care Teams.  These Care Teams include your primary Cardiologist (physician) and Advanced Practice Providers (APPs -  Physician Assistants and Nurse Practitioners) who all work together to provide you with the care you need, when you need it.  We recommend signing up for the patient portal called "MyChart".  Sign up information is provided on this After Visit Summary.  MyChart is used to connect with patients for Virtual Visits (Telemedicine).  Patients are able to view lab/test results, encounter notes, upcoming appointments, etc.  Non-urgent messages can be sent to your provider as well.   To learn more about what you can do with MyChart, go to [email or web address].    Your next appointment:   6 month(s)  Provider:   Jari Favre, PA-C, Robin Searing, NP, Jacolyn Reedy, PA-C, Tereso Newcomer, PA-C, or Perlie Gold, PA-C     Then, Dr Donato Schultz will plan to see you again in 1 year(s).       1st Floor: - Lobby - Registration  - Pharmacy  - Lab - Cafe  2nd Floor: - PV Lab - Diagnostic Testing (echo, CT, nuclear med)  3rd Floor: - Vacant  4th Floor: - TCTS (cardiothoracic surgery) - AFib Clinic - Structural Heart Clinic - Vascular Surgery  - Vascular Ultrasound  5th Floor: - HeartCare Cardiology (general and EP) - Clinical Pharmacy for coumadin, hypertension, lipid, weight-loss medications, and med management appointments    Valet parking services will be available as well.

## 2023-07-22 NOTE — Progress Notes (Signed)
 Cardiology Office Note:  .   Date:  07/22/2023  ID:  Connie [name], DOB 1928/01/11, MRN [id number] PCP: Joselyn Arrow, MD  [facility] Providers Cardiologist:  Donato Schultz, MD    History of Present Illness: .   Connie [name] is a 89 y.o. female Discussed the use of AI scribe software for clinical note transcription with the patient, who gave verbal consent to proceed.  History of Present Illness Connie [name] is a 89 year old female with paroxysmal atrial fibrillation who presents for follow-up. She is accompanied by her middle daughter, who serves as an extra pair of ears. She was a former patient of Dr. Verdis Prime for management of atrial fibrillation.  She is here for follow-up of paroxysmal atrial fibrillation, which was identified on a cardiac event monitor in December 2021. She is currently on anticoagulation therapy with Xarelto 15 mg daily, adjusted for renal function, and amiodarone 100 mg daily for suppression of atrial fibrillation. She has difficulty recognizing when she is in atrial fibrillation unless symptoms become severe. She experiences occasional tiredness and shortness of breath, particularly when hurrying, but is unsure if these are related to atrial fibrillation or her age. She has not been consistent with checking her blood pressure, which could indicate atrial fibrillation if irregularities are noted.  Her past cardiac workup includes an echocardiogram from December 2021, which showed an ejection fraction of 60%, mild left atrial dilation, a small pericardial effusion without tamponade, moderate mitral annular calcification, and mild aortic valve sclerosis without stenosis. A prior chest X-ray showed no acute abnormalities, and her lungs appeared stable.  She also takes pravastatin 40 mg daily for cholesterol management, with an LDL level of 69. Her recent lab work in 2024 showed hemoglobin at 12.4, creatinine at 0.9, and TSH at 3.6.  She  lives at a retirement community called [facility] in [place], where she walks a quarter of a mile each way to the dining room, which she acknowledges is beneficial for her health. She wants to walk more outside as the weather improves but is cautious about the risk of falls. She experiences dependent edema in her legs during the day, which resolves by morning. Her family history is significant for heart conditions, with both parents having heart issues. Her father lived to 108 and her mother to 24, with her mother experiencing heart problems from the age of 87.     Studies Reviewed: Marland Kitchen   EKG Interpretation Date/Time:  Thursday July 22 2023 09:31:31 EDT Ventricular Rate:  72 PR Interval:  204 QRS Duration:  88 QT Interval:  422 QTC Calculation: 462 R Axis:   84  Text Interpretation: Normal sinus rhythm Septal infarct (cited on or before 29-Dec-2004) When compared with ECG of 04-Jul-2015 15:08, No significant change was found Confirmed by Donato Schultz ([id number]) on 07/22/2023 9:40:33 AM    Results LABS Hb: 12.4 g/dL (1914) Cr: 0.9 mg/dL (7829) TSH: 3.6 mIU/L (2024) LDL: 69 mg/dL  RADIOLOGY Chest x-ray: No acute abnormalities, lungs appeared stable (2023)  DIAGNOSTIC Echocardiogram: Ejection fraction 60%, mildly dilated left atrium, small pericardial effusion without tamponade, moderate mitral annular calcification, mild aortic valve sclerosis without stenosis (04/2020) EKG: Stable with no bradycardia Risk Assessment/Calculations:            Physical Exam:   VS:  BP (!) 134/58   Pulse 73   Ht 5' 0.5" (1.537 m)   Wt 147 lb 6.4 oz (66.9 kg)   SpO2 96%  BMI 28.31 kg/m    Wt Readings from Last 3 Encounters:  07/22/23 147 lb 6.4 oz (66.9 kg)  03/17/23 149 lb 6.4 oz (67.8 kg)  09/02/22 152 lb 12.8 oz (69.3 kg)    GEN: Well nourished, well developed in no acute distress NECK: No JVD; No carotid bruits CARDIAC: RRR, no murmurs, no rubs, no gallops RESPIRATORY:  Clear to  auscultation without rales, wheezing or rhonchi  ABDOMEN: Soft, non-tender, non-distended EXTREMITIES:  No edema; No deformity   ASSESSMENT AND PLAN: .    Assessment and Plan Assessment & Plan Paroxysmal Atrial Fibrillation Paroxysmal atrial fibrillation diagnosed in December 2021, managed with anticoagulation (Xarelto 15 mg daily) and rhythm control (amiodarone 100 mg daily). Xarelto dose adjusted for renal function. She reports difficulty recognizing episodes unless severe, with no recent significant episodes. Occasional shortness of breath, likely age-related. Current management plan effective in maintaining stability. If no dramatic changes or significant symptoms occur, no further intervention is necessary. - Continue Xarelto 15 mg daily. - Continue amiodarone 100 mg daily. - Monitor for symptoms of atrial fibrillation, such as significant shortness of breath or heart palpitations. - Encourage regular blood pressure monitoring to detect irregularities. - Schedule follow-up in six months with the cardiology team.  Mitral Annular Calcification and Aortic Valve Sclerosis Moderate mitral annular calcification and mild aortic valve sclerosis noted on echocardiogram from December 2021. No significant stenosis or severe valvular issues present. Heart's pump function strong with an ejection fraction of 60%. No current symptoms warranting further investigation. - No immediate intervention required unless symptomatic changes occur.  Hyperlipidemia Hyperlipidemia managed with pravastatin 40 mg daily. LDL cholesterol well-controlled at 69 mg/dL, meeting the target goal. - Continue pravastatin 40 mg daily.  Dependent Edema Reports of dependent edema, particularly in the legs, resolving by morning. Consistent with age-related changes in fluid dynamics. No acute intervention required.  Goals of Care Resides in a supportive community ([facility]) environment and is active in daily activities.  Expressed desire to maintain independence and continue current lifestyle.  Follow-up Emphasized importance of regular monitoring due to amiodarone use. - Schedule follow-up appointment in six months with the cardiology team, APP. - Plan for annual follow-up with the primary cardiologist.         Signed, Donato Schultz, MD

## 2023-08-09 ENCOUNTER — Other Ambulatory Visit: Payer: Self-pay | Admitting: Family Medicine

## 2023-08-09 ENCOUNTER — Other Ambulatory Visit: Payer: Self-pay | Admitting: Cardiology

## 2023-08-09 DIAGNOSIS — N3941 Urge incontinence: Secondary | ICD-10-CM

## 2023-08-23 ENCOUNTER — Other Ambulatory Visit: Payer: Self-pay

## 2023-08-23 DIAGNOSIS — I480 Paroxysmal atrial fibrillation: Secondary | ICD-10-CM

## 2023-08-23 MED ORDER — RIVAROXABAN 15 MG PO TABS: 15.0000 mg | ORAL_TABLET | Freq: Every day | ORAL | 1 refills | Status: DC

## 2023-08-23 NOTE — Telephone Encounter (Signed)
 Prescription refill request for Xarelto  received.  Indication:afib Last office visit:3/25 Weight:66.9  kg Age:89 Scr:0.96  11/24 CrCl:38.67  ml/min  Prescription refilled

## 2023-08-31 ENCOUNTER — Other Ambulatory Visit: Payer: Self-pay | Admitting: Family Medicine

## 2023-08-31 DIAGNOSIS — E782 Mixed hyperlipidemia: Secondary | ICD-10-CM

## 2023-09-07 ENCOUNTER — Other Ambulatory Visit: Payer: Medicare HMO

## 2023-09-13 ENCOUNTER — Other Ambulatory Visit: Payer: Self-pay

## 2023-09-13 MED ORDER — AMIODARONE HCL 200 MG PO TABS: 200.0000 mg | ORAL_TABLET | Freq: Every day | ORAL | 3 refills | Status: DC

## 2023-09-14 ENCOUNTER — Ambulatory Visit: Payer: Medicare HMO

## 2023-09-14 NOTE — Progress Notes (Unsigned)
 No chief complaint on file.  Connie [name] is a 89 y.o. female who presents for annual physical exam, Medicare wellness visit and follow-up on chronic medical conditions.   Hyperlipidemia and aortic atherosclerosis (noted on CXR 06/2021):  Compliant with 40mg  of pravastatin  without side effects.  She didn't tolerate fish oil in the past (due to belching), and not taking Lovaza  (d/t insurance issue) or Vascepa  (did not tolerate due to acid reflux, even just 1 capsule daily).  She reports eating occasional fried fish. No longer eating desserts. Lipids were at goal on this regimen in November. Lab Results  Component Value Date   CHOL 145 03/10/2023   HDL 54 03/10/2023   LDLCALC 69 03/10/2023   TRIG 122 03/10/2023   CHOLHDL 2.7 03/10/2023     Pre-diabetes--She admits she enjoys good breads and ice cream. She tries to limit her portions. Not having as many sandwiches. She hasn't been ordering dessert. Last A1c was 5.9 in 03/2023.    Atrial fibrillation:  She is under the care of cardiology, and rhythm is controlled with amiodarone . She continues on diltiazem  240mg  and Xarelto . She denies any significant bleeding.  She saw Dr. Renna Cary in March, and no changes were made.  Hypertension: controlled with diltiazem  240mg . She previously took HCTZ , but was stopped in 01/2022 due to Cr going up to 1.75 (suspected pre-renal component). Cr improved on recheck. She has remained off HCTZ.  She hasn't been checking her BP at home.  She has some feet swelling just in the evenings, gone by morning.   BP Readings from Last 3 Encounters:  07/22/23 (!) 134/58  03/17/23 (!) 128/58  09/02/22 120/60     Gout:  Allopurinol  dose was decreased from 200 mg to 100 mg (since hydrochlorothiazide  was stopped).  Repeat uric acid level remained at goal on the lower dose.   She denies any gout flares (great toe).  She uses Tylenol Arthritis 2 BID, which controls her pain (feet, hands, back). Lab Results   Component Value Date   LABURIC 5.5 06/02/2023    History of breast cancer:  Last mammogram was in 2020, declines further.  Denies breast concerns. She no longer wears her compression sleeve, as she can't get it on/off by herself.  She hasn't noticed any significant swelling.  Urge urinary incontinence--at her physical last year she reported that Detrol  LA wasn't adequately controlling her symptoms. We switched her to Myrbetriq  at that time. She has been doing well on 25mg . She reports improvement, getting up usually 1-2x/night. She has less incontinence, she is able to get to the bathroom better during the day. She denies side effects. Denies any dysuria, odor or hematuria. She wears Poise pads daily, due to the urgency and incontinence, but now often it is dry.      Immunization History  Administered Date(s) Administered   Fluad Quad(high Dose 65+) 01/04/2019, 01/11/2020, 02/12/2021, 02/19/2022   Fluad Trivalent(High Dose 65+) 03/17/2023   Fluzone Influenza virus vaccine,trivalent (IIV3), split virus 03/12/2009   Hepatitis A 11/06/1995, 08/25/1996   IPV 11/06/1995   Influenza Split 02/02/2011, 02/02/2012, 01/19/2013   Influenza, High Dose Seasonal PF 02/24/2016, 02/01/2017, 02/17/2018   Influenza-Unspecified 01/16/2014, 02/02/2015   Meningococcal polysaccharide vaccine (MPSV4) 11/06/1995   Moderna Covid-19 Vaccine Bivalent Booster 23yrs & up 06/14/2021   Moderna Sars-Covid-2 Vaccination 05/08/2019, 06/05/2019, 03/12/2020, 09/25/2020   PNEUMOCOCCAL CONJUGATE-20 09/02/2022   Pfizer(Comirnaty)Fall Seasonal Vaccine 12 years and older 02/19/2022, 02/21/2023   Pneumococcal Conjugate-13 05/01/2013   Pneumococcal Polysaccharide-23 11/06/1995,  06/04/2005, 06/15/2016   Respiratory Syncytial Virus Vaccine,Recomb Aduvanted(Arexvy) 04/10/2022   Tdap 11/06/1995, 06/04/2005, 06/16/2013, 12/06/2014   Typhoid Live 11/15/1995, 04/29/2010, 06/10/2015   Yellow Fever 04/04/2012   Zoster  Recombinant(Shingrix) 08/20/2017, 11/18/2017   Zoster, Live 06/04/2005   Last Pap smear: 6/09   Last mammogram: 03/2019 Last colonoscopy: 12/2008   Last DEXA: 09/2015  T-1.1 L fem neck Dentist: regular, 1-2 times/year (every 9 months) Ophtho: yearly  Exercise:  Walks 1/2 mile daily (to/from meals.)     Patient Care Team: Roosvelt Colla, MD as PCP - General (Family Medicine) Hugh Madura, MD as PCP - Cardiology (Cardiology) [place], Heather, OD (Optometry) Glory Larsen, MD as Referring Physician (Dermatology) Garrett Kallman, MD (Gastroenterology) Lizzie Riis, DPM as Consulting Physician (Podiatry) Jolly Needle, MD (Inactive) as Consulting Physician (Cardiology) Dentist: Dr. Neldon [place] Oncologist: Dr. Meredeth Stallion (she has been released from her care) Podiatrist: Dr. Alvah Auerbach    Depression Screening: Flowsheet Row Office Visit from 09/02/2022 in [facility]  PHQ-2 Total Score 0        Falls screen:     09/02/2022    1:37 PM 07/30/2021    1:48 PM 02/12/2021    2:40 PM 07/29/2020    2:47 PM 07/12/2019    1:31 PM  Fall Risk   Falls in the past year? 0 1 0 0 1  Number falls in past yr: 0 1 0  0  Comment  Oct 2022 x 2     Injury with Fall? 0 1 0  1  Comment  bruising   at [facility] in January and injured her lower legs/contusions and bruising  Risk for fall due to : No Fall Risks History of fall(s) No Fall Risks    Follow up Falls evaluation completed Falls evaluation completed Falls evaluation completed       Functional Status Survey:          End of Life Discussion:  Patient has a living will and medical power of attorney (in chart) She signed DNR in 07/2021.    PMH, PSH, SH and FH were reviewed and updated    ROS: The patient denies anorexia, fever, weight changes, headaches, vision changes, ear pain, sore throat, breast concerns, chest pain, dizziness, syncope, dyspnea on exertion, cough, swelling, nausea, vomiting, diarrhea, melena, hematochezia,  indigestion/heartburn (infrequent, per HPI), hematuria, dysuria, vaginal bleeding, discharge, odor or itch, genital lesions, weakness, tremor, suspicious skin lesions, depression, anxiety, abnormal bleeding/bruising, or enlarged lymph nodes.   +urge urinary incontinence (see HPI). +joint pains in hands/fingers/feet--treated with tylenol, per HPI. Burning pain at her right pinkie joint +hearing loss--has hearing aids, working well Constipation is controlled with Miralax daily.  LBP? MOODS?    PHYSICAL EXAM:  There were no vitals taken for this visit.   Wt Readings from Last 3 Encounters:  07/22/23 147 lb 6.4 oz (66.9 kg)  03/17/23 149 lb 6.4 oz (67.8 kg)  09/02/22 152 lb 12.8 oz (69.3 kg)    General Appearance:    Alert, cooperative, no distress, appears stated age     Head:    Normocephalic, without obvious abnormality, atraumatic     Eyes:    PERRL, conjunctiva/corneas clear, EOM's intact, fundi benign     Ears:    Normal TM's and EACs.   Nose:    Normal, no sinus tenderness  Throat:    Clear, no erythema or lesions     Neck:    Supple, no lymphadenopathy; thyroid : no enlargement/ tenderness/nodules; no carotid bruit  or JVD     Back:    No spinal or CVA tenderness.   Lungs:    Clear to auscultation bilaterally without wheezes, rales or ronchi; respirations unlabored     Chest Wall:    No tenderness or deformity     Heart:    Regular rate and rhythm, S1 and S2 normal, no murmur, rub or gallop.   Breast Exam:    R breast absent, well healed surgical scars, nontender. Egg-sized soft tissue, cystic-swelling at proximal part of right pectoralis tendon (below proximal humerus), soft, mobile, nontender (approx 6-7 x 4cm, which is a little larger than previously noted when she was compliant with wearing sleeve)   L breast normal-- no masses or nipple discharge. nipple is mildly inverted, chronic. No axillary lymphadenopathy     Abdomen:    Soft, non-tender, nondistended, normoactive bowel  sounds, no masses, no hepatosplenomegaly.   Genitalia:    Exam declined by patient.   Rectal:    Exam declined by patient   Extremities:    No clubbing, cyanosis or edema. Hyperpigmentation and scarring from prior injury at R shin  Pulses:    2+ and symmetric all extremities     Skin:    Skin color, texture, turgor normal.  Many angiomas on trunk   Lymph nodes:    Cervical, supraclavicular and axillary nodes normal     Neurologic:    Normal strength, sensation and gait; Negative SLR; Reflexes 2+ and symmetric throughout                         Psych:   Normal mood, affect, hygiene and grooming   ***UPDATE BREAST EXAM, SKIN/RLE   ASSESSMENT/PLAN:     Discussed monthly self breast exams and yearly mammograms (past due, declines); at least 30 minutes of aerobic activity at least 5 days/week; weight-bearing exercise at least 2x/wk; proper sunscreen use reviewed; healthy diet, including goals of calcium and vitamin D  intake and alcohol recommendations (less than or equal to 1 drink/day) reviewed; regular seatbelt use; changing batteries in smoke detectors. Immunization recommendations discussed--continue yearly high dose flu shots.  Tdap next year. COVID booster *** Colonoscopy recommendations reviewed--last done 12/2008. F/u not needed due to age, unless problems.   Pap smear not indicated due to age and low risk.  Aaron Aas   MOST form was reviewed. DNR, limited measures.   Pt has pink original form at home, and DNR.  F/u 6 month med check with fasting labs prior--CBC, c-met, lipid, uric acid, A1c, TSH    Medicare Attestation I have personally reviewed: The patient's medical and social history Their use of alcohol, tobacco or illicit drugs Their current medications and supplements The patient's functional ability including ADLs,fall risks, home safety risks, cognitive, and hearing and visual impairment Diet and physical activities Evidence for depression or mood disorders  The patient's  weight, height, BMI, and visual acuity have been recorded in the chart.  I have made referrals, counseling, and provided education to the patient based on review of the above and I have provided the patient with a written personalized care plan for preventive services.

## 2023-09-14 NOTE — Patient Instructions (Incomplete)
 HEALTH MAINTENANCE RECOMMENDATIONS:  It is recommended that you get at least 30 minutes of aerobic exercise at least 5 days/week (for weight loss, you may need as much as 60-90 minutes). This can be any activity that gets your heart rate up. This can be divided in 10-15 minute intervals if needed, but try and build up your endurance at least once a week.  Weight bearing exercise is also recommended twice weekly.  Eat a healthy diet with lots of vegetables, fruits and fiber.  "Colorful" foods have a lot of vitamins (ie green vegetables, tomatoes, red peppers, etc).  Limit sweet tea, regular sodas and alcoholic beverages, all of which has a lot of calories and sugar.  Up to 1 alcoholic drink daily may be beneficial for women (unless trying to lose weight, watch sugars).  Drink a lot of water.  Calcium recommendations are 1200-1500 mg daily (1500 mg for postmenopausal women or women without ovaries), and vitamin D  1000 IU daily.  This should be obtained from diet and/or supplements (vitamins), and calcium should not be taken all at once, but in divided doses.  Monthly self breast exams and yearly mammograms for women over the age of 47 is recommended.  Sunscreen of at least SPF 30 should be used on all sun-exposed parts of the skin when outside between the hours of 10 am and 4 pm (not just when at beach or pool, but even with exercise, golf, tennis, and yard work!)  Use a sunscreen that says "broad spectrum" so it covers both UVA and UVB rays, and make sure to reapply every 1-2 hours.  Remember to change the batteries in your smoke detectors when changing your clock times in the spring and fall. Carbon monoxide detectors are recommended for your home.  Use your seat belt every time you are in a car, and please drive safely and not be distracted with cell phones and texting while driving.   Ms. Coolman , Thank you for taking time to come for your Medicare Wellness Visit. I appreciate your ongoing  commitment to your health goals. Please review the following plan we discussed and let me know if I can assist you in the future.   This is a list of the screening recommended for you and due dates:  Health Maintenance  Topic Date Due   COVID-19 Vaccine (8 - Moderna risk 2024-25 season) 08/22/2023   Flu Shot  12/03/2023   Medicare Annual Wellness Visit  09/14/2024   DTaP/Tdap/Td vaccine (5 - Td or Tdap) 12/05/2024   Pneumonia Vaccine  Completed   DEXA scan (bone density measurement)  Completed   Zoster (Shingles) Vaccine  Completed   HPV Vaccine  Aged Out   Meningitis B Vaccine  Aged Out   COVID boosters are recommended every 6 months for people >65. You can get one now from the pharmacy, and get another when it is updated in the Fall. Continue yearly high dose flu shots in the Fall. You will be due for a tetanus booster (TdaP) next year --from the pharmacy.  Start using Flonase nasal spray daily.  This can help with allergies, including reducing the postnasal drainage, and can also help some with eye allergy symptoms. Be sure to use just gentle sniffs, 2 sprays into each nostril, once daily. It can take at least a week to see the benefit. You can continue to use the pataday along with it, and claritin if needed as well.   Try and limit gas-producing foods (beans, dairy--including  creamy sauces, soups) and fried foods.  You can use Voltaren  Gel as needed for arthritis pain (ie flares of arthritis in your hands) if the tylenol isn't adequately controlling your symptoms.  This is topical and safe to use.  Consider exercises in your home (seated or standing,), as these are close to a bathroom, versus looking to the gym for an exercise bike or other equipment, that is also likely near a bathroom.

## 2023-09-15 ENCOUNTER — Ambulatory Visit: Payer: Medicare HMO | Admitting: Family Medicine

## 2023-09-15 ENCOUNTER — Encounter: Payer: Self-pay | Admitting: Family Medicine

## 2023-09-15 VITALS — BP 130/60 | HR 72 | Ht 60.5 in | Wt 153.4 lb

## 2023-09-15 DIAGNOSIS — Z6829 Body mass index (BMI) 29.0-29.9, adult: Secondary | ICD-10-CM | POA: Diagnosis not present

## 2023-09-15 DIAGNOSIS — E782 Mixed hyperlipidemia: Secondary | ICD-10-CM | POA: Diagnosis not present

## 2023-09-15 DIAGNOSIS — Z7901 Long term (current) use of anticoagulants: Secondary | ICD-10-CM

## 2023-09-15 DIAGNOSIS — R7303 Prediabetes: Secondary | ICD-10-CM

## 2023-09-15 DIAGNOSIS — Z0000 Encounter for general adult medical examination without abnormal findings: Secondary | ICD-10-CM

## 2023-09-15 DIAGNOSIS — Z5181 Encounter for therapeutic drug level monitoring: Secondary | ICD-10-CM

## 2023-09-15 DIAGNOSIS — M109 Gout, unspecified: Secondary | ICD-10-CM

## 2023-09-15 DIAGNOSIS — R0681 Apnea, not elsewhere classified: Secondary | ICD-10-CM | POA: Diagnosis not present

## 2023-09-15 DIAGNOSIS — I480 Paroxysmal atrial fibrillation: Secondary | ICD-10-CM

## 2023-09-15 DIAGNOSIS — I10 Essential (primary) hypertension: Secondary | ICD-10-CM | POA: Diagnosis not present

## 2023-09-15 DIAGNOSIS — Z853 Personal history of malignant neoplasm of breast: Secondary | ICD-10-CM | POA: Diagnosis not present

## 2023-09-15 DIAGNOSIS — I700 Atherosclerosis of aorta: Secondary | ICD-10-CM | POA: Diagnosis not present

## 2023-09-15 DIAGNOSIS — N1832 Chronic kidney disease, stage 3b: Secondary | ICD-10-CM

## 2023-09-15 DIAGNOSIS — G478 Other sleep disorders: Secondary | ICD-10-CM

## 2023-09-15 LAB — POCT GLYCOSYLATED HEMOGLOBIN (HGB A1C): Hemoglobin A1C: 5.7 % — AB (ref 4.0–5.6)

## 2023-09-16 ENCOUNTER — Ambulatory Visit: Payer: Self-pay | Admitting: Family Medicine

## 2023-09-16 LAB — CBC WITH DIFFERENTIAL/PLATELET
Basophils Absolute: 0 10*3/uL (ref 0.0–0.2)
Basos: 1 %
EOS (ABSOLUTE): 0.1 10*3/uL (ref 0.0–0.4)
Eos: 1 %
Hematocrit: 38.9 % (ref 34.0–46.6)
Hemoglobin: 13.1 g/dL (ref 11.1–15.9)
Immature Grans (Abs): 0 10*3/uL (ref 0.0–0.1)
Immature Granulocytes: 0 %
Lymphocytes Absolute: 1.6 10*3/uL (ref 0.7–3.1)
Lymphs: 21 %
MCH: 31.8 pg (ref 26.6–33.0)
MCHC: 33.7 g/dL (ref 31.5–35.7)
MCV: 94 fL (ref 79–97)
Monocytes Absolute: 0.6 10*3/uL (ref 0.1–0.9)
Monocytes: 8 %
Neutrophils Absolute: 5 10*3/uL (ref 1.4–7.0)
Neutrophils: 69 %
Platelets: 177 10*3/uL (ref 150–450)
RBC: 4.12 x10E6/uL (ref 3.77–5.28)
RDW: 12.4 % (ref 11.7–15.4)
WBC: 7.3 10*3/uL (ref 3.4–10.8)

## 2023-09-16 LAB — COMPREHENSIVE METABOLIC PANEL WITH GFR
ALT: 14 IU/L (ref 0–32)
AST: 19 IU/L (ref 0–40)
Albumin: 4.2 g/dL (ref 3.6–4.6)
Alkaline Phosphatase: 147 IU/L — ABNORMAL HIGH (ref 44–121)
BUN/Creatinine Ratio: 25 (ref 12–28)
BUN: 34 mg/dL (ref 10–36)
Bilirubin Total: 0.3 mg/dL (ref 0.0–1.2)
CO2: 24 mmol/L (ref 20–29)
Calcium: 9.5 mg/dL (ref 8.7–10.3)
Chloride: 101 mmol/L (ref 96–106)
Creatinine, Ser: 1.38 mg/dL — ABNORMAL HIGH (ref 0.57–1.00)
Globulin, Total: 2.1 g/dL (ref 1.5–4.5)
Glucose: 88 mg/dL (ref 70–99)
Potassium: 4.1 mmol/L (ref 3.5–5.2)
Sodium: 141 mmol/L (ref 134–144)
Total Protein: 6.3 g/dL (ref 6.0–8.5)
eGFR: 36 mL/min/{1.73_m2} — ABNORMAL LOW (ref >59)

## 2023-09-21 ENCOUNTER — Ambulatory Visit: Payer: Medicare HMO | Admitting: Podiatry

## 2023-09-28 ENCOUNTER — Encounter: Payer: Self-pay | Admitting: Podiatry

## 2023-09-28 ENCOUNTER — Ambulatory Visit: Admitting: Podiatry

## 2023-09-28 DIAGNOSIS — I739 Peripheral vascular disease, unspecified: Secondary | ICD-10-CM

## 2023-09-28 DIAGNOSIS — B351 Tinea unguium: Secondary | ICD-10-CM | POA: Diagnosis not present

## 2023-09-28 DIAGNOSIS — M79674 Pain in right toe(s): Secondary | ICD-10-CM

## 2023-09-28 DIAGNOSIS — M79675 Pain in left toe(s): Secondary | ICD-10-CM | POA: Diagnosis not present

## 2023-09-28 NOTE — Progress Notes (Signed)
  Subjective:  Patient ID: Connie [name], female    DOB: [birth year],   MRN: [id number]  Chief Complaint  Patient presents with   Nail Problem    "Toenails and a few calluses"    89 y.o. female presents for concern of thickened elongated and painful nails that are difficult to trim. Requesting to have them trimmed today. History of PAD and at risk for foot care.   PCP:  Roosvelt Colla, MD    . Denies any other pedal complaints. Denies n/v/f/c.   Past Medical History:  Diagnosis Date   Abnormal brain MRI 8/01   small hemorrhagic stroke and possible cavernous hemangioma (Dr. Dania Dupre)   Adenomatous colon polyp 1/03   Arthritis    OA spine, hands   Breast cancer (HCC) 2001   R breast (T3N1, ER/PR+, HER-2 +) s/p mastectomy, chemo and chest wall irradiation (Dr. Kendra Pavy)   DDD (degenerative disc disease), lumbar    GERD (gastroesophageal reflux disease)    Hearing loss 2012   wears hearing aids   Herpes zoster 2003   Hyperlipidemia    Hypertension    Onychomycosis 2003, 2008   treated with Lamisil   Osteopenia    (DEXA's done by Dr. Kendra Pavy); prev took Fosamax.  No change in DEXA after off Fosamax x 2 years   Ovarian cyst 12/2008   right   Personal history of chemotherapy    Personal history of radiation therapy    Postmenopausal bleeding 2000   benign EMB (Dr. Harlon Light)   Urge urinary incontinence     Objective:  Physical Exam: Vascular: DP/PT pulses 1/4 bilateral. CFT <3 seconds. Absent hair growth on digits. Edema noted to bilateral lower extremities. Xerosis noted bilaterally.  Skin. No lacerations or abrasions bilateral feet. Nails 1-5 bilateral  are thickened discolored and elongated with subungual debris.  Musculoskeletal: MMT 5/5 bilateral lower extremities in DF, PF, Inversion and Eversion. Deceased ROM in DF of ankle joint.  Neurological: Sensation intact to light touch. Protective sensation intact bilateral.    Assessment:   1. Pain due to onychomycosis of  toenails of both feet   2. PAD (peripheral artery disease) (HCC)      Plan:  Patient was evaluated and treated and all questions answered. -Mechanically debrided all nails 1-5 bilateral using sterile nail nipper and filed with dremel without incident  -Answered all patient questions -Patient to return  in 10 weeks for at risk foot care -Patient advised to call the office if any problems or questions arise in the meantime.   Jennefer Moats, DPM

## 2023-10-06 ENCOUNTER — Ambulatory Visit: Admitting: Podiatry

## 2023-10-11 ENCOUNTER — Other Ambulatory Visit: Payer: Self-pay | Admitting: Family Medicine

## 2023-10-11 DIAGNOSIS — M109 Gout, unspecified: Secondary | ICD-10-CM

## 2023-10-19 ENCOUNTER — Ambulatory Visit (HOSPITAL_BASED_OUTPATIENT_CLINIC_OR_DEPARTMENT_OTHER): Admitting: Internal Medicine

## 2023-10-25 ENCOUNTER — Ambulatory Visit (HOSPITAL_BASED_OUTPATIENT_CLINIC_OR_DEPARTMENT_OTHER): Attending: Family Medicine | Admitting: Internal Medicine

## 2023-10-25 DIAGNOSIS — G478 Other sleep disorders: Secondary | ICD-10-CM | POA: Insufficient documentation

## 2023-10-25 DIAGNOSIS — R0681 Apnea, not elsewhere classified: Secondary | ICD-10-CM

## 2023-10-25 DIAGNOSIS — G4733 Obstructive sleep apnea (adult) (pediatric): Secondary | ICD-10-CM | POA: Diagnosis not present

## 2023-10-25 DIAGNOSIS — G4730 Sleep apnea, unspecified: Secondary | ICD-10-CM

## 2023-10-31 ENCOUNTER — Encounter: Payer: Self-pay | Admitting: Family Medicine

## 2023-10-31 DIAGNOSIS — G4733 Obstructive sleep apnea (adult) (pediatric): Secondary | ICD-10-CM | POA: Insufficient documentation

## 2023-10-31 NOTE — Procedures (Signed)
 [facility] [address] [place], [place] [address] Tel: [phone]   Fax: [phone]  Home Sleep Test Interpretation  Patient Name: Connie [name], Connie [name] Date: 10/25/2023  Date of Birth: [birth year] Study Type: HST  Age: 89 year MRN #: [id number]  Sex: Female Interpreting Physician: NEYSA RAMA, [id number]  Height: 5' 1 Referring Physician: Dr. Elyn Fetters  Weight: 148.0 lbs Recording Tech: Will Poet RRT RPSGT RST  BMI: 28.3 Scoring Tech: Will Poet RRT RPSGT RST  ESS: 7 Neck Size: 14   Indications for Polysomnography The patient is a 89 year-old Female who is 5' 1 and weighs 148.0 lbs. Her BMI equals 28.3.  A home sleep apnea test was performed to evaluate for -OSA  Medication  No Data.   Polysomnogram Data A home sleep test recorded the standard physiologic parameters including EKG, nasal and oral airflow.  Respiratory parameters of chest and abdominal movements were recorded with Respiratory Inductance Plethysmography belts.  Oxygen saturation was recorded by pulse oximetry.   Study Architecture The total recording time of the polysomnogram was 377.7 minutes.  The total monitoring time was 378.0 minutes.  Time spent in Supine position was 15.5 minutes.   Respiratory Events The study revealed a presence of 122 obstructive, 4 central, and 1 mixed apneas resulting in an Apnea index of 20.2 events per hour.  There were 54 hypopneas (>=3% desaturation and/or arousal) resulting in an Apnea\Hypopnea Index (AHI >=3% desaturation and/or arousal) of 28.7 events per hour.  There were 28 hypopneas (>=4% desaturation) resulting in an Apnea\Hypopnea Index (AHI >=4% desaturation) of 24.6 events per hour.  There were - Respiratory Effort Related Arousals resulting in a RERA index of - events per hour. The Respiratory Disturbance Index is 28.7 events per hour.  The snore index was - events per hour.  Mean oxygen saturation was 92.3%.  The  lowest oxygen saturation during monitoring time was 81.0%.  Time spent <=88% oxygen saturation was 11.1 minutes (3.0%).  Cardiac Summary The average pulse rate was 61.6 bpm.  The minimum pulse rate was 55.0 bpm while the maximum pulse rate was 211.0 bpm.  Cardiac rhythm was normal  Comment:  Moderate obstructive, sleep apnea, AHI (4%) 24.6/hr. Snoring with oxygen desaturation to a nadir of 81%, mean 92.3%.   Diagnosis: Obstructive sleep apnea  Recommendations: Suggest autopap or CPAP titration sleep study.   This study was personally reviewed and electronically signed by: Dr. RAMA JONETTA NEYSA Accredited Board Certified in Sleep Medicine Date/Time: 10/31/23   2:03   Study Overview  Recording Time: 551.2 min. Monitoring Time: 378.0 min.  Analysis Start:  10:44:05 PM Supine Time: 15.5 min.  Analysis Stop:  05:01:48 AM     Study Summary   Count Index Longest Event Duration  Apneas & Hypopneas: 181 28.7  Apneas: 91.9 sec.     Hypopneas: 93.1 sec.  RERAs: - - - sec.  Desaturations: 144 22.9 103.9 sec.  Snores: - - - sec.    Minimum Oxygen Saturation: 81.0%    Respiratory Summary   Total Duration Supine Non-Supine   Count Index Average Longest Count Index Count Index  Obstructive Apnea 122 19.4 27.4 91.9 - - 122 20.2   Mixed Apnea 1 0.2 28.6 28.6 - - 1 0.2   Central Apnea 4 0.6 20.5 25.8 - - 4 0.7   Total Apneas 127 20.2 27.2 91.9 - - 127 21.0  Hypopneas 3% 54 8.6 N.A. N.A. - - 54 8.9   Apneas & Hyp. 3% 181 28.7 N.A. N.A. - - 181 30.0            Hypopneas 4% 28 4.4 N.A. N.A. - - 28 4.6  Apneas & Hyp. 4% 155 24.6 N.A. N.A. - - 155 25.7             RERAs - - - - - - - -  RDI 181 28.7 N.A. N.A. - - 181 30.0   Oxygen Saturation Summary   Total Supine Non-Supine  Average SpO2 92.3% 92.7% 92.2%  Minimum SpO2 81.0% 86.0% 81.0%   Maximum SpO2 97.0% 95.0% 97.0%   Oxygen Saturation Distribution  Range (%) Time in range (min) Time in range (%)  90.0 - 100.0 325.7  87.3%  80.0 - 90.0 47.2 12.7%  70.0 - 80.0 - -  60.0 - 70.0 - -  50.0 - 60.0 - -  0.0 - 50.0 - -  Time Spent <=88% SpO2  Range (%) Time in range (min) Time in range (%)  0.0 - 88.0 11.1 3.0%  Cardiac Summary   Total Supine Non-Supine  Average Pulse Rate (BPM) 61.6 67.9 61.4  Minimum Pulse Rate (BPM) 55.0 60.0 55.0  Maximum Pulse Rate (BPM) 211.0 211.0 137.0                      Technologist Comments  -                        Reggy Neysa Bateman, Biomedical engineer of Sleep Medicine  ELECTRONICALLY SIGNED ON:  10/31/2023, 1:58 PM [facility] PH: [phone]   FX: [phone] ACCREDITED BY THE AMERICAN ACADEMY OF SLEEP MEDICINE

## 2023-11-02 NOTE — Progress Notes (Signed)
Left message for patient asking her to please call me back.  °

## 2023-11-03 ENCOUNTER — Other Ambulatory Visit: Payer: Self-pay | Admitting: *Deleted

## 2023-11-03 ENCOUNTER — Telehealth: Payer: Self-pay | Admitting: *Deleted

## 2023-11-03 DIAGNOSIS — G4730 Sleep apnea, unspecified: Secondary | ICD-10-CM

## 2023-11-03 NOTE — Telephone Encounter (Signed)
 Copied from CRM [id number]. Topic: Clinical - Lab/Test Results >> Nov 03, 2023  2:48 PM Graeme ORN wrote: Reason for CRM: Patient called. Returned missed call. Looks like call was for sleep study from [place]. Patient would like a call back at home phone. Thank You  Called patient and ordered CPAP.

## 2023-11-03 NOTE — Progress Notes (Signed)
 Patient agreed.

## 2023-11-08 ENCOUNTER — Other Ambulatory Visit: Payer: Self-pay | Admitting: Family Medicine

## 2023-11-08 DIAGNOSIS — N3941 Urge incontinence: Secondary | ICD-10-CM

## 2023-11-19 DIAGNOSIS — G4733 Obstructive sleep apnea (adult) (pediatric): Secondary | ICD-10-CM | POA: Diagnosis not present

## 2023-11-29 ENCOUNTER — Other Ambulatory Visit: Payer: Self-pay | Admitting: Family Medicine

## 2023-11-29 DIAGNOSIS — E782 Mixed hyperlipidemia: Secondary | ICD-10-CM

## 2023-12-02 ENCOUNTER — Telehealth: Payer: Self-pay | Admitting: *Deleted

## 2023-12-02 DIAGNOSIS — I469 Cardiac arrest, cause unspecified: Secondary | ICD-10-CM | POA: Diagnosis not present

## 2023-12-03 NOTE — Telephone Encounter (Signed)
 I have tried to call patient's numbers, Lonell number (doesn't work) , [facility] Verneita Ghent, I left a message with to see if we could get some contact information.

## 2023-12-03 NOTE — Telephone Encounter (Signed)
 I'd like to get more information, since I haven't seen her since May, and she was doing fairly well. I tried to call her daughter Lonell (who is a doctor), to see if perhaps she had spoken to her, if she had been complaining of anything. The phone number listed in Epic doesn't work.  I left a message at the number listed in message for the funeral home. Can somebody call the funeral home and see if they have the phone number for her daughter Lonell?  Or see if anyone at [facility] has her contact info??  Thanks

## 2023-12-03 NOTE — Telephone Encounter (Signed)
 Copied from CRM [id number]. Topic: General - Deceased Patient >> 12-29-2023  4:24 PM Travis F wrote: Name of caller: Maurilio   Date of death: 2023/12/29  Name of funeral home: [facility] [address] patient's assisted living facility   Phone number of funeral home: [phone]  Provider that needs to sign form: Death Certificate   Timeline for signing: Non given, just needs to verify she will sign

## 2023-12-03 NOTE — Telephone Encounter (Signed)
 Got phone number for Lonell from [employer] [facility]. [phone]. She was actually with the patient until Wed and noon, and she was fine, not complaining of anything. She learned of her passing 01/08/2024 afternoon. She likely died in the morning (bed wasn't made), found in the bathroom, on the floor, pants off--which she often does when her stomach is bothering her, likes the feel of the cold floor. Emesis on the floor nearby. Found because she didn't come to the door when hairdresser came for appt.  Other daughter's phone number  Landry Bruch [phone] Spoke with [facility] as well.  Death Certificate will be completed through [facility] system

## 2023-12-03 DEATH — deceased

## 2023-12-08 ENCOUNTER — Ambulatory Visit: Admitting: Podiatry

## 2024-03-23 ENCOUNTER — Encounter: Admitting: Family Medicine
